# Patient Record
Sex: Female | Born: 1935 | Race: Black or African American | Hispanic: No | Marital: Married | Smoking: Never smoker
Health system: Southern US, Community
[De-identification: ages and names within clinical notes are randomized; demographics above are authoritative.]

## PROBLEM LIST (undated history)

## (undated) DIAGNOSIS — Z7901 Long term (current) use of anticoagulants: Secondary | ICD-10-CM

## (undated) DIAGNOSIS — I471 Supraventricular tachycardia, unspecified: Secondary | ICD-10-CM

## (undated) DIAGNOSIS — M1990 Unspecified osteoarthritis, unspecified site: Secondary | ICD-10-CM

## (undated) DIAGNOSIS — N184 Chronic kidney disease, stage 4 (severe): Secondary | ICD-10-CM

## (undated) DIAGNOSIS — D649 Anemia, unspecified: Secondary | ICD-10-CM

## (undated) DIAGNOSIS — Z9289 Personal history of other medical treatment: Secondary | ICD-10-CM

## (undated) DIAGNOSIS — K759 Inflammatory liver disease, unspecified: Secondary | ICD-10-CM

## (undated) DIAGNOSIS — Z95810 Presence of automatic (implantable) cardiac defibrillator: Secondary | ICD-10-CM

## (undated) DIAGNOSIS — G8929 Other chronic pain: Secondary | ICD-10-CM

## (undated) DIAGNOSIS — I509 Heart failure, unspecified: Secondary | ICD-10-CM

## (undated) DIAGNOSIS — I5022 Chronic systolic (congestive) heart failure: Secondary | ICD-10-CM

## (undated) DIAGNOSIS — I10 Essential (primary) hypertension: Secondary | ICD-10-CM

## (undated) DIAGNOSIS — I2510 Atherosclerotic heart disease of native coronary artery without angina pectoris: Secondary | ICD-10-CM

## (undated) DIAGNOSIS — I428 Other cardiomyopathies: Secondary | ICD-10-CM

## (undated) DIAGNOSIS — I4589 Other specified conduction disorders: Secondary | ICD-10-CM

## (undated) DIAGNOSIS — I8290 Acute embolism and thrombosis of unspecified vein: Secondary | ICD-10-CM

## (undated) DIAGNOSIS — M546 Pain in thoracic spine: Secondary | ICD-10-CM

## (undated) DIAGNOSIS — M549 Dorsalgia, unspecified: Secondary | ICD-10-CM

## (undated) DIAGNOSIS — I639 Cerebral infarction, unspecified: Secondary | ICD-10-CM

## (undated) HISTORY — DX: Anemia, unspecified: D64.9

## (undated) HISTORY — DX: Long term (current) use of anticoagulants: Z79.01

## (undated) HISTORY — PX: AV NODE ABLATION: SHX1209

## (undated) HISTORY — DX: Supraventricular tachycardia, unspecified: I47.10

## (undated) HISTORY — DX: Acute embolism and thrombosis of unspecified vein: I82.90

## (undated) HISTORY — PX: BI-VENTRICULAR IMPLANTABLE CARDIOVERTER DEFIBRILLATOR  (CRT-D): SHX5747

## (undated) HISTORY — DX: Other cardiomyopathies: I42.8

## (undated) HISTORY — PX: ABDOMINAL HYSTERECTOMY: SHX81

## (undated) HISTORY — DX: Supraventricular tachycardia: I47.1

## (undated) HISTORY — DX: Unspecified osteoarthritis, unspecified site: M19.90

## (undated) HISTORY — DX: Essential (primary) hypertension: I10

## (undated) HISTORY — PX: CATARACT EXTRACTION W/ INTRAOCULAR LENS IMPLANT: SHX1309

## (undated) HISTORY — DX: Cerebral infarction, unspecified: I63.9

## (undated) HISTORY — PX: CARDIAC CATHETERIZATION: SHX172

---

## 1960-09-01 DIAGNOSIS — K759 Inflammatory liver disease, unspecified: Secondary | ICD-10-CM

## 1960-09-01 HISTORY — DX: Inflammatory liver disease, unspecified: K75.9

## 1990-09-01 DIAGNOSIS — I639 Cerebral infarction, unspecified: Secondary | ICD-10-CM

## 1990-09-01 HISTORY — DX: Cerebral infarction, unspecified: I63.9

## 2001-04-13 ENCOUNTER — Encounter: Payer: Self-pay | Admitting: Family Medicine

## 2001-04-13 ENCOUNTER — Ambulatory Visit (HOSPITAL_COMMUNITY): Admission: RE | Admit: 2001-04-13 | Discharge: 2001-04-13 | Payer: Self-pay | Admitting: Family Medicine

## 2001-06-25 ENCOUNTER — Ambulatory Visit (HOSPITAL_COMMUNITY): Admission: RE | Admit: 2001-06-25 | Discharge: 2001-06-25 | Payer: Self-pay | Admitting: Family Medicine

## 2001-06-25 ENCOUNTER — Encounter: Payer: Self-pay | Admitting: Family Medicine

## 2001-08-17 ENCOUNTER — Ambulatory Visit (HOSPITAL_COMMUNITY): Admission: RE | Admit: 2001-08-17 | Discharge: 2001-08-17 | Payer: Self-pay | Admitting: Internal Medicine

## 2001-09-07 ENCOUNTER — Ambulatory Visit (HOSPITAL_COMMUNITY): Admission: RE | Admit: 2001-09-07 | Discharge: 2001-09-08 | Payer: Self-pay | Admitting: *Deleted

## 2003-09-05 ENCOUNTER — Ambulatory Visit (HOSPITAL_COMMUNITY): Admission: RE | Admit: 2003-09-05 | Discharge: 2003-09-05 | Payer: Self-pay | Admitting: Family Medicine

## 2004-04-16 ENCOUNTER — Ambulatory Visit (HOSPITAL_COMMUNITY): Admission: RE | Admit: 2004-04-16 | Discharge: 2004-04-16 | Payer: Self-pay | Admitting: Family Medicine

## 2004-06-24 ENCOUNTER — Ambulatory Visit (HOSPITAL_COMMUNITY): Admission: RE | Admit: 2004-06-24 | Discharge: 2004-06-24 | Payer: Self-pay | Admitting: Cardiology

## 2005-03-26 ENCOUNTER — Ambulatory Visit (HOSPITAL_COMMUNITY): Admission: RE | Admit: 2005-03-26 | Discharge: 2005-03-26 | Payer: Self-pay | Admitting: Family Medicine

## 2006-08-11 ENCOUNTER — Ambulatory Visit (HOSPITAL_COMMUNITY): Admission: RE | Admit: 2006-08-11 | Discharge: 2006-08-11 | Payer: Self-pay | Admitting: Ophthalmology

## 2006-09-03 ENCOUNTER — Ambulatory Visit (HOSPITAL_COMMUNITY): Admission: RE | Admit: 2006-09-03 | Discharge: 2006-09-03 | Payer: Self-pay | Admitting: Ophthalmology

## 2006-09-03 ENCOUNTER — Ambulatory Visit (HOSPITAL_COMMUNITY): Admission: RE | Admit: 2006-09-03 | Discharge: 2006-09-03 | Payer: Self-pay | Admitting: Family Medicine

## 2006-09-07 ENCOUNTER — Ambulatory Visit: Payer: Self-pay | Admitting: Cardiology

## 2006-09-07 ENCOUNTER — Inpatient Hospital Stay (HOSPITAL_COMMUNITY): Admission: AD | Admit: 2006-09-07 | Discharge: 2006-09-11 | Payer: Self-pay | Admitting: Family Medicine

## 2006-09-23 ENCOUNTER — Ambulatory Visit: Payer: Self-pay | Admitting: Cardiology

## 2006-10-22 ENCOUNTER — Ambulatory Visit: Payer: Self-pay | Admitting: Internal Medicine

## 2006-12-21 ENCOUNTER — Ambulatory Visit: Payer: Self-pay | Admitting: Cardiology

## 2007-01-20 ENCOUNTER — Ambulatory Visit: Payer: Self-pay | Admitting: Cardiology

## 2007-03-23 ENCOUNTER — Ambulatory Visit: Payer: Self-pay | Admitting: Cardiology

## 2007-09-27 ENCOUNTER — Ambulatory Visit (HOSPITAL_COMMUNITY): Admission: RE | Admit: 2007-09-27 | Discharge: 2007-09-27 | Payer: Self-pay | Admitting: Family Medicine

## 2007-09-27 ENCOUNTER — Encounter (INDEPENDENT_AMBULATORY_CARE_PROVIDER_SITE_OTHER): Payer: Self-pay | Admitting: Family Medicine

## 2007-09-27 ENCOUNTER — Inpatient Hospital Stay (HOSPITAL_COMMUNITY): Admission: AD | Admit: 2007-09-27 | Discharge: 2007-10-01 | Payer: Self-pay | Admitting: Family Medicine

## 2007-09-27 ENCOUNTER — Ambulatory Visit: Payer: Self-pay | Admitting: Cardiology

## 2007-10-07 ENCOUNTER — Ambulatory Visit: Payer: Self-pay | Admitting: Cardiology

## 2007-11-05 ENCOUNTER — Ambulatory Visit (HOSPITAL_COMMUNITY): Admission: RE | Admit: 2007-11-05 | Discharge: 2007-11-05 | Payer: Self-pay | Admitting: Cardiology

## 2007-11-05 ENCOUNTER — Ambulatory Visit: Payer: Self-pay | Admitting: Cardiology

## 2007-12-29 ENCOUNTER — Ambulatory Visit: Payer: Self-pay | Admitting: Internal Medicine

## 2007-12-29 ENCOUNTER — Ambulatory Visit (HOSPITAL_COMMUNITY): Admission: RE | Admit: 2007-12-29 | Discharge: 2007-12-29 | Payer: Self-pay | Admitting: Internal Medicine

## 2008-01-03 ENCOUNTER — Ambulatory Visit: Payer: Self-pay | Admitting: Internal Medicine

## 2008-01-03 ENCOUNTER — Inpatient Hospital Stay (HOSPITAL_COMMUNITY): Admission: RE | Admit: 2008-01-03 | Discharge: 2008-01-04 | Payer: Self-pay | Admitting: Internal Medicine

## 2008-01-10 ENCOUNTER — Ambulatory Visit: Payer: Self-pay

## 2008-01-19 ENCOUNTER — Ambulatory Visit: Payer: Self-pay | Admitting: Internal Medicine

## 2008-02-10 ENCOUNTER — Ambulatory Visit: Payer: Self-pay | Admitting: Cardiovascular Disease

## 2008-04-26 ENCOUNTER — Ambulatory Visit: Payer: Self-pay | Admitting: Internal Medicine

## 2008-07-20 ENCOUNTER — Ambulatory Visit (HOSPITAL_COMMUNITY): Admission: RE | Admit: 2008-07-20 | Discharge: 2008-07-20 | Payer: Self-pay | Admitting: Cardiology

## 2008-07-20 ENCOUNTER — Ambulatory Visit: Payer: Self-pay | Admitting: Cardiology

## 2008-07-21 ENCOUNTER — Ambulatory Visit: Payer: Self-pay | Admitting: Cardiology

## 2008-11-02 ENCOUNTER — Encounter: Payer: Self-pay | Admitting: Internal Medicine

## 2008-11-06 ENCOUNTER — Encounter: Payer: Self-pay | Admitting: Emergency Medicine

## 2008-11-06 ENCOUNTER — Ambulatory Visit: Payer: Self-pay | Admitting: Cardiology

## 2008-11-07 ENCOUNTER — Inpatient Hospital Stay (HOSPITAL_COMMUNITY): Admission: EM | Admit: 2008-11-07 | Discharge: 2008-11-15 | Payer: Self-pay | Admitting: Cardiology

## 2008-11-23 ENCOUNTER — Ambulatory Visit: Payer: Self-pay | Admitting: Cardiology

## 2008-12-13 ENCOUNTER — Encounter: Payer: Self-pay | Admitting: Internal Medicine

## 2008-12-13 ENCOUNTER — Ambulatory Visit: Payer: Self-pay | Admitting: Internal Medicine

## 2009-02-01 ENCOUNTER — Inpatient Hospital Stay (HOSPITAL_COMMUNITY): Admission: AD | Admit: 2009-02-01 | Discharge: 2009-02-07 | Payer: Self-pay | Admitting: Cardiology

## 2009-02-01 ENCOUNTER — Encounter: Payer: Self-pay | Admitting: Cardiology

## 2009-02-01 ENCOUNTER — Ambulatory Visit: Payer: Self-pay | Admitting: Cardiology

## 2009-02-01 DIAGNOSIS — I6350 Cerebral infarction due to unspecified occlusion or stenosis of unspecified cerebral artery: Secondary | ICD-10-CM | POA: Insufficient documentation

## 2009-02-12 ENCOUNTER — Encounter: Payer: Self-pay | Admitting: Physician Assistant

## 2009-02-12 ENCOUNTER — Ambulatory Visit: Payer: Self-pay | Admitting: Cardiology

## 2009-02-12 DIAGNOSIS — M109 Gout, unspecified: Secondary | ICD-10-CM | POA: Insufficient documentation

## 2009-02-14 ENCOUNTER — Ambulatory Visit: Payer: Self-pay | Admitting: Cardiology

## 2009-02-14 ENCOUNTER — Inpatient Hospital Stay (HOSPITAL_COMMUNITY): Admission: AD | Admit: 2009-02-14 | Discharge: 2009-02-22 | Payer: Self-pay | Admitting: Cardiology

## 2009-02-15 ENCOUNTER — Encounter: Payer: Self-pay | Admitting: Cardiology

## 2009-02-27 ENCOUNTER — Encounter: Payer: Self-pay | Admitting: Cardiology

## 2009-02-27 ENCOUNTER — Ambulatory Visit: Payer: Self-pay | Admitting: Cardiology

## 2009-02-27 DIAGNOSIS — N183 Chronic kidney disease, stage 3 unspecified: Secondary | ICD-10-CM | POA: Insufficient documentation

## 2009-03-08 ENCOUNTER — Telehealth: Payer: Self-pay | Admitting: Cardiology

## 2009-03-12 ENCOUNTER — Encounter: Payer: Self-pay | Admitting: Cardiology

## 2009-03-12 LAB — CONVERTED CEMR LAB
AST: 17 units/L (ref 0–37)
Alkaline Phosphatase: 95 units/L (ref 39–117)
Basophils Absolute: 0 10*3/uL (ref 0.0–0.1)
Basophils Relative: 1 % (ref 0–1)
CO2: 26 meq/L (ref 19–32)
Calcium: 8.9 mg/dL (ref 8.4–10.5)
HCT: 32 % — ABNORMAL LOW (ref 36.0–46.0)
Lymphocytes Relative: 29 % (ref 12–46)
Lymphs Abs: 1.9 10*3/uL (ref 0.7–4.0)
MCHC: 30.9 g/dL (ref 30.0–36.0)
MCV: 92 fL (ref 78.0–100.0)
Monocytes Relative: 10 % (ref 3–12)
Neutro Abs: 3.8 10*3/uL (ref 1.7–7.7)
Platelets: 332 10*3/uL (ref 150–400)
Potassium: 3.5 meq/L (ref 3.5–5.3)
RBC: 3.48 M/uL — ABNORMAL LOW (ref 3.87–5.11)
Total Bilirubin: 0.4 mg/dL (ref 0.3–1.2)
WBC: 6.5 10*3/uL (ref 4.0–10.5)

## 2009-03-19 ENCOUNTER — Encounter: Payer: Self-pay | Admitting: Cardiology

## 2009-03-29 ENCOUNTER — Ambulatory Visit (HOSPITAL_COMMUNITY): Admission: RE | Admit: 2009-03-29 | Discharge: 2009-03-29 | Payer: Self-pay | Admitting: Cardiology

## 2009-03-29 ENCOUNTER — Encounter: Payer: Self-pay | Admitting: Cardiology

## 2009-03-29 ENCOUNTER — Ambulatory Visit: Payer: Self-pay | Admitting: Cardiology

## 2009-03-30 ENCOUNTER — Ambulatory Visit: Payer: Self-pay | Admitting: Cardiology

## 2009-04-11 ENCOUNTER — Encounter: Payer: Self-pay | Admitting: Internal Medicine

## 2009-04-11 ENCOUNTER — Ambulatory Visit: Payer: Self-pay | Admitting: Internal Medicine

## 2009-04-11 ENCOUNTER — Encounter: Payer: Self-pay | Admitting: Cardiology

## 2009-04-13 ENCOUNTER — Encounter: Payer: Self-pay | Admitting: Cardiology

## 2009-04-13 LAB — CONVERTED CEMR LAB
Calcium: 9.2 mg/dL (ref 8.4–10.5)
Chloride: 102 meq/L (ref 96–112)
Potassium: 3.5 meq/L (ref 3.5–5.3)

## 2009-04-19 ENCOUNTER — Ambulatory Visit (HOSPITAL_COMMUNITY): Admission: RE | Admit: 2009-04-19 | Discharge: 2009-04-19 | Payer: Self-pay | Admitting: Family Medicine

## 2009-04-25 ENCOUNTER — Ambulatory Visit: Payer: Self-pay | Admitting: Cardiology

## 2009-05-28 ENCOUNTER — Encounter: Payer: Self-pay | Admitting: Cardiology

## 2009-05-29 ENCOUNTER — Encounter (INDEPENDENT_AMBULATORY_CARE_PROVIDER_SITE_OTHER): Payer: Self-pay | Admitting: *Deleted

## 2009-05-29 LAB — CONVERTED CEMR LAB
ALT: 11 units/L (ref 0–35)
AST: 15 units/L (ref 0–37)
Albumin: 3.4 g/dL — ABNORMAL LOW (ref 3.5–5.2)
Alkaline Phosphatase: 74 units/L (ref 39–117)
Basophils Absolute: 0.1 10*3/uL (ref 0.0–0.1)
CO2: 27 meq/L (ref 19–32)
Calcium: 8.5 mg/dL (ref 8.4–10.5)
Eosinophils Absolute: 0.1 10*3/uL (ref 0.0–0.7)
Eosinophils Relative: 1 % (ref 0–5)
HCT: 31.3 % — ABNORMAL LOW (ref 36.0–46.0)
Lymphs Abs: 1.7 10*3/uL (ref 0.7–4.0)
Monocytes Absolute: 0.9 10*3/uL (ref 0.1–1.0)
Monocytes Relative: 10 % (ref 3–12)
Neutro Abs: 5.9 10*3/uL (ref 1.7–7.7)
Potassium: 4 meq/L (ref 3.5–5.3)
RBC: 3.45 M/uL — ABNORMAL LOW (ref 3.87–5.11)
RDW: 15.2 % (ref 11.5–15.5)
Total Bilirubin: 0.4 mg/dL (ref 0.3–1.2)
Total Protein: 6.2 g/dL (ref 6.0–8.3)

## 2009-06-04 LAB — CONVERTED CEMR LAB
CO2: 25 meq/L (ref 19–32)
Calcium: 8.5 mg/dL (ref 8.4–10.5)
Chloride: 103 meq/L (ref 96–112)
Glucose, Bld: 116 mg/dL — ABNORMAL HIGH (ref 70–99)
Sodium: 144 meq/L (ref 135–145)

## 2009-06-13 ENCOUNTER — Ambulatory Visit (HOSPITAL_COMMUNITY): Admission: RE | Admit: 2009-06-13 | Discharge: 2009-06-13 | Payer: Self-pay | Admitting: Family Medicine

## 2009-06-26 ENCOUNTER — Ambulatory Visit: Payer: Self-pay | Admitting: Cardiology

## 2009-06-26 ENCOUNTER — Encounter (INDEPENDENT_AMBULATORY_CARE_PROVIDER_SITE_OTHER): Payer: Self-pay | Admitting: *Deleted

## 2009-07-04 ENCOUNTER — Encounter (INDEPENDENT_AMBULATORY_CARE_PROVIDER_SITE_OTHER): Payer: Self-pay | Admitting: *Deleted

## 2009-07-04 LAB — CONVERTED CEMR LAB
OCCULT 1: NEGATIVE
OCCULT 2: NEGATIVE

## 2009-07-06 ENCOUNTER — Encounter (INDEPENDENT_AMBULATORY_CARE_PROVIDER_SITE_OTHER): Payer: Self-pay | Admitting: *Deleted

## 2009-07-06 LAB — CONVERTED CEMR LAB
OCCULT 1: NEGATIVE
OCCULT 2: NEGATIVE

## 2009-07-09 ENCOUNTER — Encounter (INDEPENDENT_AMBULATORY_CARE_PROVIDER_SITE_OTHER): Payer: Self-pay | Admitting: *Deleted

## 2009-07-18 ENCOUNTER — Ambulatory Visit: Payer: Self-pay | Admitting: Internal Medicine

## 2009-08-06 ENCOUNTER — Emergency Department (HOSPITAL_COMMUNITY): Admission: EM | Admit: 2009-08-06 | Discharge: 2009-08-06 | Payer: Self-pay | Admitting: Emergency Medicine

## 2009-08-06 ENCOUNTER — Encounter: Payer: Self-pay | Admitting: Orthopedic Surgery

## 2009-08-08 ENCOUNTER — Encounter (INDEPENDENT_AMBULATORY_CARE_PROVIDER_SITE_OTHER): Payer: Self-pay | Admitting: *Deleted

## 2009-08-08 ENCOUNTER — Ambulatory Visit (HOSPITAL_COMMUNITY): Admission: RE | Admit: 2009-08-08 | Discharge: 2009-08-08 | Payer: Self-pay | Admitting: Adult Health

## 2009-08-08 ENCOUNTER — Ambulatory Visit: Payer: Self-pay | Admitting: Adult Health

## 2009-08-08 DIAGNOSIS — R269 Unspecified abnormalities of gait and mobility: Secondary | ICD-10-CM

## 2009-08-08 LAB — CONVERTED CEMR LAB
BUN: 33 mg/dL
Chloride: 103 meq/L
Chloride: 103 meq/L
Creatinine, Ser: 2.05 mg/dL
Glucose, Bld: 129 mg/dL
HCT: 27.2 %
INR: 3.21
MCV: 87 fL
Potassium: 3.5 meq/L
Potassium: 3.5 meq/L
Sodium: 138 meq/L

## 2009-08-09 ENCOUNTER — Ambulatory Visit: Payer: Self-pay | Admitting: Orthopedic Surgery

## 2009-08-09 DIAGNOSIS — S63509A Unspecified sprain of unspecified wrist, initial encounter: Secondary | ICD-10-CM | POA: Insufficient documentation

## 2009-08-10 ENCOUNTER — Encounter (INDEPENDENT_AMBULATORY_CARE_PROVIDER_SITE_OTHER): Payer: Self-pay | Admitting: *Deleted

## 2009-08-10 ENCOUNTER — Ambulatory Visit: Payer: Self-pay | Admitting: Cardiology

## 2009-08-30 ENCOUNTER — Encounter: Payer: Self-pay | Admitting: Adult Health

## 2009-08-30 ENCOUNTER — Encounter (INDEPENDENT_AMBULATORY_CARE_PROVIDER_SITE_OTHER): Payer: Self-pay | Admitting: *Deleted

## 2009-08-30 LAB — CONVERTED CEMR LAB
AST: 14 units/L (ref 0–37)
Albumin: 3.8 g/dL
BUN: 32 mg/dL — ABNORMAL HIGH (ref 6–23)
Basophils Absolute: 0 10*3/uL (ref 0.0–0.1)
Basophils Relative: 1 % (ref 0–1)
CO2: 21 meq/L
Calcium: 8.9 mg/dL (ref 8.4–10.5)
Chloride: 104 meq/L
Chloride: 104 meq/L (ref 96–112)
Creatinine, Ser: 1.65 mg/dL
Creatinine, Ser: 1.65 mg/dL — ABNORMAL HIGH (ref 0.40–1.20)
Eosinophils Absolute: 0.1 10*3/uL (ref 0.0–0.7)
Eosinophils Relative: 2 % (ref 0–5)
Glucose, Bld: 106 mg/dL
HCT: 33.4 % — ABNORMAL LOW (ref 36.0–46.0)
Lymphs Abs: 3.1 10*3/uL (ref 0.7–4.0)
MCHC: 33.2 g/dL (ref 30.0–36.0)
MCV: 85.9 fL (ref 78.0–100.0)
Neutrophils Relative %: 49 % (ref 43–77)
Platelets: 287 10*3/uL (ref 150–400)
RDW: 15.9 % — ABNORMAL HIGH (ref 11.5–15.5)
Total Bilirubin: 0.3 mg/dL (ref 0.3–1.2)
Total Protein: 6.8 g/dL

## 2009-09-12 ENCOUNTER — Encounter (INDEPENDENT_AMBULATORY_CARE_PROVIDER_SITE_OTHER): Payer: Self-pay | Admitting: *Deleted

## 2009-09-14 ENCOUNTER — Encounter: Payer: Self-pay | Admitting: Orthopedic Surgery

## 2009-09-14 ENCOUNTER — Ambulatory Visit: Payer: Self-pay | Admitting: Cardiology

## 2009-09-14 ENCOUNTER — Encounter (INDEPENDENT_AMBULATORY_CARE_PROVIDER_SITE_OTHER): Payer: Self-pay | Admitting: *Deleted

## 2009-09-20 ENCOUNTER — Ambulatory Visit: Payer: Self-pay | Admitting: Orthopedic Surgery

## 2009-10-03 ENCOUNTER — Encounter: Payer: Self-pay | Admitting: Cardiology

## 2009-10-08 ENCOUNTER — Encounter (INDEPENDENT_AMBULATORY_CARE_PROVIDER_SITE_OTHER): Payer: Self-pay | Admitting: *Deleted

## 2009-10-08 LAB — CONVERTED CEMR LAB
CO2: 27 meq/L
Glucose, Bld: 115 mg/dL
Potassium: 3.5 meq/L
Sodium: 144 meq/L

## 2009-10-09 ENCOUNTER — Encounter (INDEPENDENT_AMBULATORY_CARE_PROVIDER_SITE_OTHER): Payer: Self-pay | Admitting: *Deleted

## 2009-10-09 ENCOUNTER — Encounter: Payer: Self-pay | Admitting: Cardiology

## 2009-10-09 LAB — CONVERTED CEMR LAB
Chloride: 105 meq/L (ref 96–112)
Cholesterol: 230 mg/dL — ABNORMAL HIGH (ref 0–200)
Glucose, Bld: 115 mg/dL — ABNORMAL HIGH (ref 70–99)
Potassium: 3.5 meq/L (ref 3.5–5.3)
Sodium: 144 meq/L (ref 135–145)
Total CHOL/HDL Ratio: 6.2
Triglycerides: 190 mg/dL — ABNORMAL HIGH (ref <150)
VLDL: 38 mg/dL (ref 0–40)

## 2009-10-19 ENCOUNTER — Encounter: Payer: Self-pay | Admitting: Internal Medicine

## 2009-10-19 ENCOUNTER — Ambulatory Visit: Payer: Self-pay | Admitting: Cardiovascular Disease

## 2009-11-01 ENCOUNTER — Ambulatory Visit: Payer: Self-pay | Admitting: Orthopedic Surgery

## 2009-11-01 ENCOUNTER — Encounter (INDEPENDENT_AMBULATORY_CARE_PROVIDER_SITE_OTHER): Payer: Self-pay | Admitting: *Deleted

## 2009-11-05 ENCOUNTER — Encounter (INDEPENDENT_AMBULATORY_CARE_PROVIDER_SITE_OTHER): Payer: Self-pay | Admitting: *Deleted

## 2009-11-05 ENCOUNTER — Ambulatory Visit: Payer: Self-pay | Admitting: Cardiology

## 2009-11-05 LAB — CONVERTED CEMR LAB
BUN: 25 mg/dL — ABNORMAL HIGH (ref 6–23)
CO2: 23 meq/L
CO2: 23 meq/L (ref 19–32)
Calcium: 9.1 mg/dL
Calcium: 9.1 mg/dL (ref 8.4–10.5)
Creatinine, Ser: 1.58 mg/dL
Creatinine, Ser: 1.58 mg/dL — ABNORMAL HIGH (ref 0.40–1.20)
Glucose, Bld: 148 mg/dL — ABNORMAL HIGH (ref 70–99)

## 2009-11-06 ENCOUNTER — Encounter (INDEPENDENT_AMBULATORY_CARE_PROVIDER_SITE_OTHER): Payer: Self-pay | Admitting: *Deleted

## 2010-01-07 ENCOUNTER — Encounter: Payer: Self-pay | Admitting: Internal Medicine

## 2010-01-07 ENCOUNTER — Ambulatory Visit: Payer: Self-pay | Admitting: Cardiology

## 2010-01-29 ENCOUNTER — Encounter (INDEPENDENT_AMBULATORY_CARE_PROVIDER_SITE_OTHER): Payer: Self-pay | Admitting: *Deleted

## 2010-02-05 ENCOUNTER — Ambulatory Visit: Payer: Self-pay | Admitting: Cardiology

## 2010-02-05 ENCOUNTER — Encounter (INDEPENDENT_AMBULATORY_CARE_PROVIDER_SITE_OTHER): Payer: Self-pay | Admitting: *Deleted

## 2010-02-05 LAB — CONVERTED CEMR LAB
ALT: 9 units/L
Albumin: 3.9 g/dL
BUN: 28 mg/dL
BUN: 28 mg/dL
BUN: 28 mg/dL — ABNORMAL HIGH (ref 6–23)
CO2: 28 meq/L
CO2: 23 meq/L (ref 19–32)
Calcium: 9.3 mg/dL
Calcium: 9.3 mg/dL (ref 8.4–10.5)
Chloride: 107 meq/L
Chloride: 107 meq/L (ref 96–112)
Creatinine, Ser: 1.35 mg/dL
Creatinine, Ser: 1.35 mg/dL — ABNORMAL HIGH (ref 0.40–1.20)
Eosinophils Absolute: 0.2 10*3/uL (ref 0.0–0.7)
Eosinophils Relative: 4 % (ref 0–5)
Glucose, Bld: 94 mg/dL
Glucose, Bld: 94 mg/dL (ref 70–99)
HCT: 33.2 % — ABNORMAL LOW (ref 36.0–46.0)
Hemoglobin: 10.6 g/dL
Lymphocytes Relative: 35 %
Lymphs Abs: 2.1 10*3/uL (ref 0.7–4.0)
MCV: 90 fL
MCV: 90.2 fL (ref 78.0–100.0)
Magnesium: 2.2 mg/dL
Magnesium: 2.2 mg/dL (ref 1.5–2.5)
Monocytes Absolute: 0.5 10*3/uL
Monocytes Absolute: 0.5 10*3/uL (ref 0.1–1.0)
Monocytes Relative: 9 %
Monocytes Relative: 9 % (ref 3–12)
Platelets: 288 10*3/uL
Platelets: 288 10*3/uL (ref 150–400)
Potassium: 3.8 meq/L
RDW: 15.3 %
Sodium: 144 meq/L
TSH: 1.417 microintl units/mL (ref 0.350–4.500)
Uric Acid, Serum: 10 mg/dL — ABNORMAL HIGH (ref 2.4–7.0)
WBC: 5.9 10*3/uL
WBC: 5.9 10*3/uL
WBC: 5.9 10*3/uL (ref 4.0–10.5)

## 2010-02-08 ENCOUNTER — Encounter: Payer: Self-pay | Admitting: Cardiology

## 2010-03-21 IMAGING — CR DG CHEST 2V
2 series · 2 of 2 positions shown · non-contrast
Comparison: 11/05/2007

CLINICAL DATA: Cardiomyopathy, dyspnea, hypertension

CHEST - 2 VIEW

[view not recorded (1 of 2)]
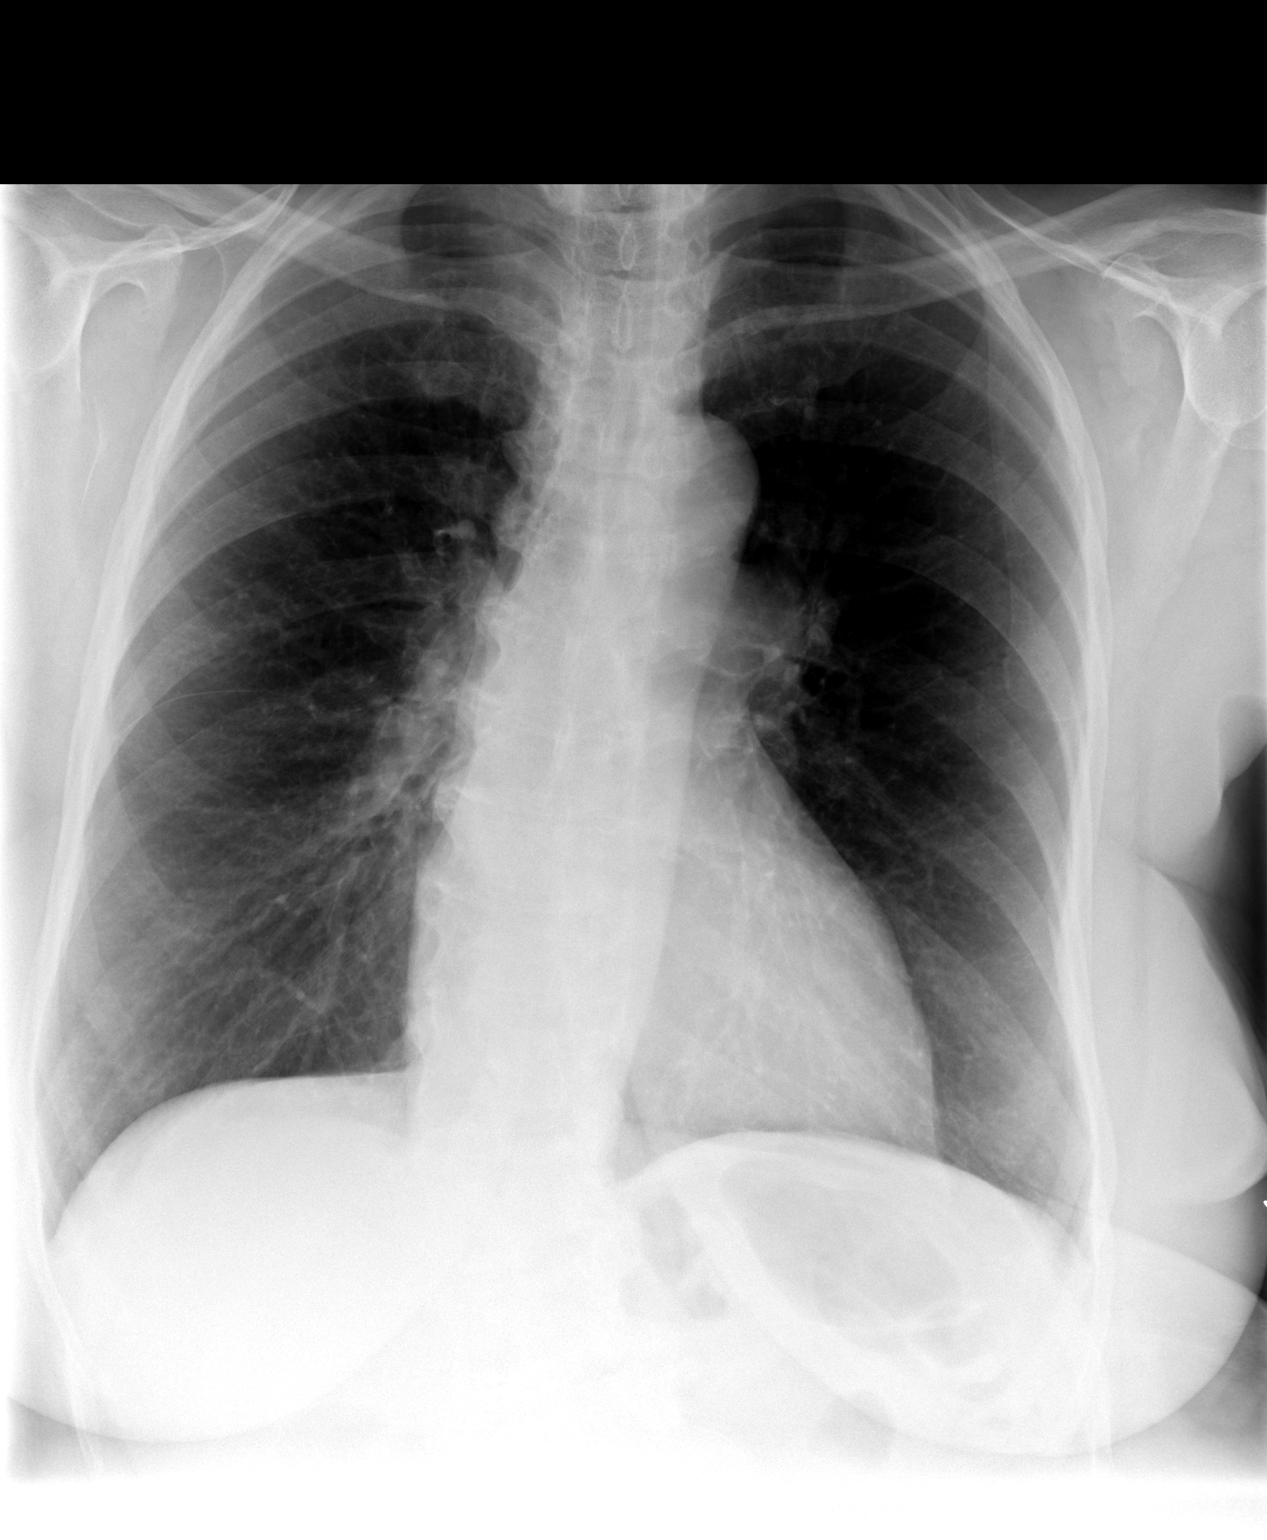

[view not recorded (2 of 2)]
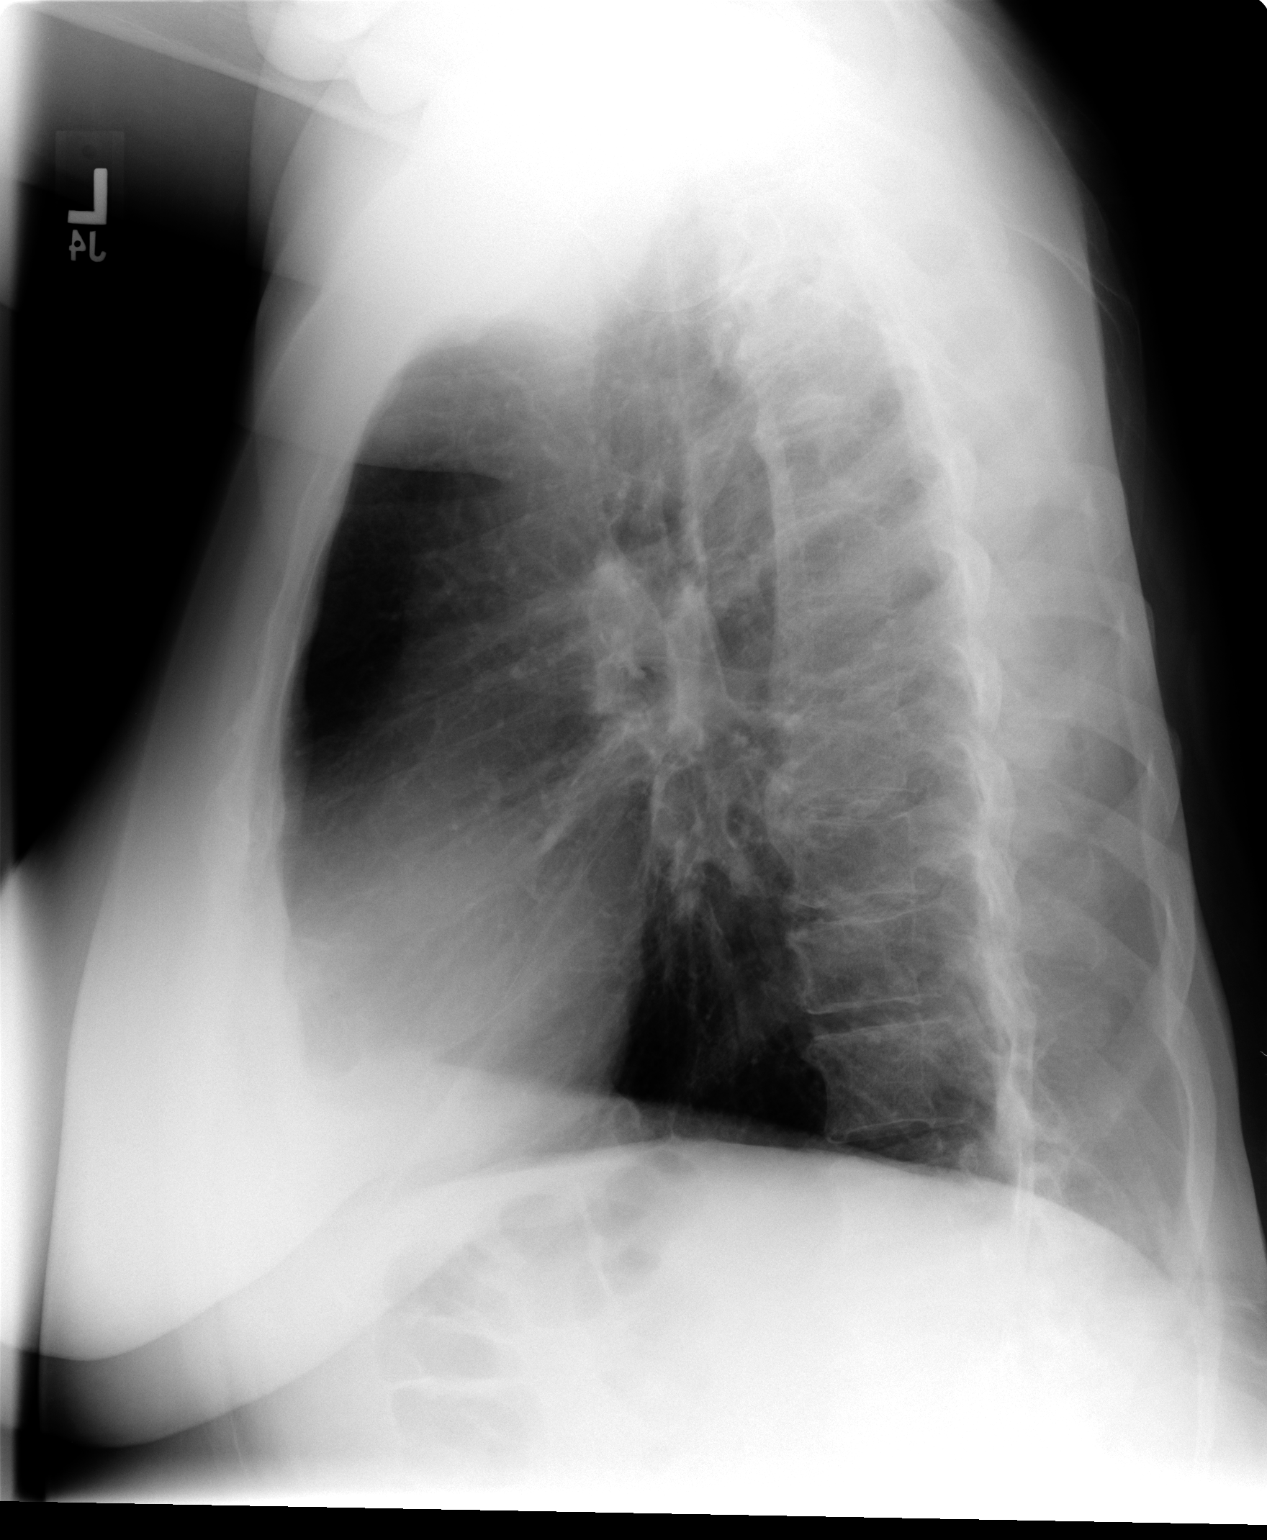

[2 of 2 positions shown; findings below may reference images not displayed]

FINDINGS: Read.
Normal mediastinal contours and pulmonary vascularity.
Mild chronic peribronchial thickening.
No pulmonary infiltrate or pleural effusion.
Biconvex thoracolumbar scoliosis with multilevel degenerative disc
disease changes and endplate spur formation.
Lungs appear mildly hyperexpanded.
IMPRESSION: Changes of COPD/chronic bronchitis.
Mild cardiomegaly.
No acute abnormalities.

## 2010-04-11 ENCOUNTER — Encounter (INDEPENDENT_AMBULATORY_CARE_PROVIDER_SITE_OTHER): Payer: Self-pay | Admitting: *Deleted

## 2010-05-02 ENCOUNTER — Ambulatory Visit: Payer: Self-pay | Admitting: Cardiology

## 2010-05-02 ENCOUNTER — Encounter: Payer: Self-pay | Admitting: Internal Medicine

## 2010-05-28 ENCOUNTER — Encounter (INDEPENDENT_AMBULATORY_CARE_PROVIDER_SITE_OTHER): Payer: Self-pay | Admitting: *Deleted

## 2010-05-31 ENCOUNTER — Encounter (INDEPENDENT_AMBULATORY_CARE_PROVIDER_SITE_OTHER): Payer: Self-pay | Admitting: *Deleted

## 2010-05-31 ENCOUNTER — Ambulatory Visit: Payer: Self-pay | Admitting: Cardiology

## 2010-05-31 LAB — CONVERTED CEMR LAB
BUN: 24 mg/dL
Basophils Absolute: 0.1 10*3/uL
Basophils Relative: 1 %
Brain Natriuretic Peptide: 138.7
HCT: 35 %
MCV: 90.2 fL
Platelets: 286 10*3/uL
Potassium: 4.2 meq/L
RDW: 13.7 %
Sodium: 144 meq/L

## 2010-06-03 ENCOUNTER — Encounter (INDEPENDENT_AMBULATORY_CARE_PROVIDER_SITE_OTHER): Payer: Self-pay | Admitting: *Deleted

## 2010-06-03 LAB — CONVERTED CEMR LAB
Basophils Absolute: 0.1 10*3/uL (ref 0.0–0.1)
Basophils Relative: 1 % (ref 0–1)
Calcium: 9.6 mg/dL (ref 8.4–10.5)
Eosinophils Absolute: 0.1 10*3/uL (ref 0.0–0.7)
Eosinophils Relative: 2 % (ref 0–5)
HCT: 35 % — ABNORMAL LOW (ref 36.0–46.0)
Lymphs Abs: 1.9 10*3/uL (ref 0.7–4.0)
MCHC: 32 g/dL (ref 30.0–36.0)
MCV: 90.2 fL (ref 78.0–100.0)
Neutrophils Relative %: 55 % (ref 43–77)
Platelets: 286 10*3/uL (ref 150–400)
Potassium: 4.2 meq/L (ref 3.5–5.3)
Pro B Natriuretic peptide (BNP): 138.7 pg/mL — ABNORMAL HIGH (ref 0.0–100.0)
RDW: 13.7 % (ref 11.5–15.5)
Sodium: 144 meq/L (ref 135–145)
WBC: 5.8 10*3/uL (ref 4.0–10.5)

## 2010-08-07 ENCOUNTER — Encounter: Payer: Self-pay | Admitting: Cardiology

## 2010-08-07 LAB — CONVERTED CEMR LAB
BUN: 29 mg/dL — ABNORMAL HIGH (ref 6–23)
CO2: 27 meq/L (ref 19–32)
Calcium: 8.9 mg/dL (ref 8.4–10.5)
Chloride: 104 meq/L (ref 96–112)
Creatinine, Ser: 1.43 mg/dL — ABNORMAL HIGH (ref 0.40–1.20)

## 2010-08-20 ENCOUNTER — Ambulatory Visit: Payer: Self-pay | Admitting: Internal Medicine

## 2010-08-20 DIAGNOSIS — I482 Chronic atrial fibrillation, unspecified: Secondary | ICD-10-CM

## 2010-08-20 DIAGNOSIS — I5022 Chronic systolic (congestive) heart failure: Secondary | ICD-10-CM

## 2010-08-29 ENCOUNTER — Ambulatory Visit: Payer: Self-pay | Admitting: Cardiology

## 2010-09-11 ENCOUNTER — Ambulatory Visit: Admission: RE | Admit: 2010-09-11 | Discharge: 2010-09-11 | Payer: Self-pay | Source: Home / Self Care

## 2010-09-11 ENCOUNTER — Encounter: Payer: Self-pay | Admitting: Cardiology

## 2010-09-11 LAB — CONVERTED CEMR LAB: POC INR: 2.8

## 2010-09-22 ENCOUNTER — Encounter: Payer: Self-pay | Admitting: Family Medicine

## 2010-09-29 LAB — CONVERTED CEMR LAB
CO2: 26 meq/L
Chloride: 105 meq/L
Glucose, Bld: 100 mg/dL
Hemoglobin: 11.2 g/dL
Sodium: 144 meq/L

## 2010-10-03 NOTE — Procedures (Signed)
Summary: PC2   Current Medications (verified): 1)  Coumadin 4 Mg Tabs (Warfarin Sodium) .... As Directed 2)  Famotidine 20 Mg Tabs (Famotidine) .... Take 1 Tablet By Mouth Two Times A Day 3)  Multi-Vitamin/iron  Tabs (Multiple Vitamins-Iron) .Marland Kitchen.. 1 Tab Once Daily 4)  Hydralazine Hcl 100 Mg Tabs (Hydralazine Hcl) .... Take 1 Tablet Three Times A Day 5)  Carvedilol 25 Mg Tabs (Carvedilol) .... Take 1 Tablet Two Times A Day 6)  Imdur 120 Mg Xr24h-Tab (Isosorbide Mononitrate) .Marland Kitchen.. 1 Tab Once Daily 7)  Digoxin 0.125 Mg Tabs (Digoxin) .... Take 1 Tablet Once Daily 8)  Furosemide 40 Mg Tabs (Furosemide) .... Take 1 1/2 Tabs Daily 9)  Klor-Con M20 20 Meq Cr-Tabs (Potassium Chloride Crys Cr) .... Take 2 Tablets in Am and 1 Tablet in Pm 10)  Prednisone 10 Mg Tabs (Prednisone) .Marland Kitchen.. 1 By Mouth Q Day  Allergies (verified): 1)  ! * Tb    ICD Specifications Following MD:  Lewayne Bunting, MD     ICD Vendor:  Medtronic     ICD Model Number:  [id number]     ICD Serial Number:  [id number] ICD DOI:  01/03/2008      Lead 1:    Location: RA     DOI: 01/03/2008     Model #: [id number]     Serial #: [id number]     Status: active Lead 2:    Location: RV     DOI: 01/03/2008     Model #: 6213     Serial #: [id number]     Status: active Lead 3:    Location: LV     DOI: 01/03/2008     Model #: [id number]     Serial #: [id number]     Status: active  Indications::  NICM   ICD Follow Up Remote Check?  No Battery Voltage:  3.11 V     Charge Time:  9.3 seconds     Underlying rhythm:  A-fib ICD Dependent:  Yes       ICD Device Measurements Atrium:  Amplitude: 1.9 mV, Impedance: 488 ohms,  Right Ventricle:  Amplitude: 7.1 mV, Impedance: 440 ohms, Threshold: 0.5 V at 0.4 msec Left Ventricle:  Impedance: 624 ohms, Threshold: 1.0 V at 0.4 msec Shock Impedance: 36/47 ohms   Episodes MS Episodes:  8659     Percent Mode Switch:  18.9%     Coumadin:  Yes Shock:  1     Nonsustained:  16     ICD Appropriate Therapy?  Yes Atrial  Pacing:  18%     Ventricular Pacing:  96.4%  Brady Parameters Mode DDD     Lower Rate Limit:  50     Upper Rate Limit 130 PAV 160     Sensed AV Delay:  140  Tachy Zones VF:  214     VT1:  182     Next Cardiology Appt Due:  12/30/2009 Tech Comments:  One VF episode with appropriate therapy, during syncopal episode.  16NSVT episodes the longest 5 seconds.  Optivol and thoracic impedance abnormal 1/14-1/25.  Ms. Noguchi has been treated over the last 2 weeks for an URI and is beginnig to feel better.  FFRW noted, atrial sensitivity reprogrammed 0.16mV.  I instructed the patient to call us for any further syncopal episodes otherwise I will see her back in the [facility] office in 3 months. Altha Harm, LPN  October 19, 2009 11:35 AM

## 2010-10-03 NOTE — Letter (Signed)
Summary: [place] Results Engineer, agricultural at [facility]  [address], [place] [address]   Phone: [phone]  Fax: [phone]      November 06, 2009 MRN: [id number]   [name] [address] [place], [place]  [address]   Dear Ms. [facility],  Your test ordered by Selena Batten has been reviewed by your physician (or physician assistant) and was found to be normal or stable. Your physician (or physician assistant) felt no changes were needed at this time.  ____ Echocardiogram  ____ Cardiac Stress Test  __x__ Lab Work  ____ Peripheral vascular study of arms, legs or neck  ____ CT scan or X-ray  ____ Lung or Breathing test  ____ Other:  No change in medical treatment at this time, per Dr. Dietrich Pates.  Enclosed is a copy of your labwork for your records.  Thank you, [name] [name] RN    [name] Bing, MD, [name], MD, F.A.C.C Lewayne Bunting, MD, F.A.C.C Nona Dell, MD, F.A.C.C Charlton Haws, MD, [name]

## 2010-10-03 NOTE — Cardiovascular Report (Signed)
Summary: Office Visit   Office Visit   Imported By: Roderic Ovens 05/20/2010 15:40:22  _____________________________________________________________________  External Attachment:    Type:   Image     Comment:   External Document

## 2010-10-03 NOTE — Letter (Signed)
Summary: [place] [facility] Work Engineer, agricultural at [employer]  [address], [place] [address]   Phone: [phone]  Fax: [phone]     October 09, 2009 MRN: [id number]   [name] [address] [place], [place]  [address]      YOUR LAB WORK IS DUE   _________________MARCH 8, 2011________________________  Please go to [facility], located across the street from [facility] on the second floor.  Hours are Monday - Friday 7am until 7:30pm         Saturday 8am until 12noon    __  DO NOT EAT OR DRINK AFTER MIDNIGHT EVENING PRIOR TO LABWORK  _X_ YOUR LABWORK IS NOT FASTING --YOU MAY EAT PRIOR TO LABWORK

## 2010-10-03 NOTE — Cardiovascular Report (Signed)
Summary: Office Visit   Office Visit   Imported By: Roderic Ovens 01/15/2010 11:32:49  _____________________________________________________________________  External Attachment:    Type:   Image     Comment:   External Document

## 2010-10-03 NOTE — Letter (Signed)
Summary: [facility]  [facility]   Imported By: Cammie Sickle 09/25/2009 16:12:51  _____________________________________________________________________  External Attachment:    Type:   Image     Comment:   External Document

## 2010-10-03 NOTE — Assessment & Plan Note (Signed)
Summary: 6 WK RE-CK/RESP TO MED/SEC HORIZ/CAF   Visit Type:  Follow-up Referring Provider:  EP-Dr. Ladona Ridgel; Orthopedics-Dr. Romeo Apple Primary Provider:  Dr. [name]  CC:  recheck wrist.  History of Present Illness: 77 year old female treated for her LEFT upper extremity pain and swelling with prednisone with good result, also use the splint we started with prednisone taper.  She's doing very well to move her wrist much better has no pain tenderness or swelling    review of systems negative for other joint ailments  Exam full pronation supination 15 improvement in wrist extension no tenderness over the wrist grade 5 grip strength wrist remains stable no sensory deficits in the hand skin is intact without rash there is no lymphadenopathy pulses are good patient was awake and alert  Impression result tenosynovitis vs. tendinitis vs. gout LEFT upper extremity  Finish prednisone call us if any problems  Allergies: 1)  ! * Tb   Impression & Recommendations:  Problem # 1:  GOUT, UNSPECIFIED (ICD-274.9) Assessment Improved  Problem # 2:  WRIST SPRAIN, LEFT (ICD-842.00) Assessment: Improved  Patient Instructions: 1)  finish prednisone  2)  then call us if pain or swelling comes back

## 2010-10-03 NOTE — Letter (Signed)
Summary: [place] Results Engineer, agricultural at [facility]  [address], [place] [address]   Phone: [phone]  Fax: [phone]      August 07, 2010 MRN: [id number]   [name] [address] [place], [place]  [address]   Dear Ms. [facility],  Your test ordered by Selena Batten has been reviewed by your physician (or physician assistant) and was found to be normal or stable. Your physician (or physician assistant) felt no changes were needed at this time.  ____ Echocardiogram  ____ Cardiac Stress Test  __X__ Lab Work  ____ Peripheral vascular study of arms, legs or neck  ____ CT scan or X-ray  ____ Lung or Breathing test  ____ Other:  Please continue on current medical treatment.  Thank you.   Larita Fife Via LPN    [place] Bing, MD, F.A.C.Gaylord Shih, MD, F.A.C.C Lewayne Bunting, MD, F.A.C.C Nona Dell, MD, F.A.C.C Charlton Haws, MD, [name]

## 2010-10-03 NOTE — Procedures (Signed)
Summary: AICD f/u   Current Medications (verified): 1)  Multi-Vitamin/iron  Tabs (Multiple Vitamins-Iron) .Marland Kitchen.. 1 Tab Once Daily 2)  Hydralazine Hcl 100 Mg Tabs (Hydralazine Hcl) .... Take 1 Tablet Three Times A Day 3)  Carvedilol 25 Mg Tabs (Carvedilol) .... Take 1 Tablet Two Times A Day 4)  Imdur 120 Mg Xr24h-Tab (Isosorbide Mononitrate) .Marland Kitchen.. 1 Tab Once Daily 5)  Digoxin 0.125 Mg Tabs (Digoxin) .... Take 1 Tablet Once Daily 6)  Furosemide 80 Mg Tabs (Furosemide) .... Take One Tablet By Mouth Daily. 7)  Klor-Con M20 20 Meq Cr-Tabs (Potassium Chloride Crys Cr) .... Take 2 Tablets in Am and 1 Tablet in Pm 8)  Uloric 80 Mg Tabs (Febuxostat) .... One By Mouth Daily  Allergies (verified): 1)  ! * Tb    ICD Specifications Following MD:  Lewayne Bunting, MD     ICD Vendor:  Medtronic     ICD Model Number:  [id number]     ICD Serial Number:  [id number] ICD DOI:  01/03/2008      Lead 1:    Location: RA     DOI: 01/03/2008     Model #: [id number]     Serial #: [id number]     Status: active Lead 2:    Location: RV     DOI: 01/03/2008     Model #: 6213     Serial #: [id number]     Status: active Lead 3:    Location: LV     DOI: 01/03/2008     Model #: 4194     Serial #: [id number]     Status: active  Indications::  NICM   ICD Follow Up Remote Check?  No Battery Voltage:  3.10 V     Charge Time:  9.8 seconds     Underlying rhythm:  a-fib ICD Dependent:  Yes       ICD Device Measurements Atrium:  Amplitude: 2.5 mV, Impedance: 600 ohms,  Right Ventricle:  Amplitude: 7.6 mV, Impedance: 408 ohms, Threshold: 0.5 V at 0.4 msec Left Ventricle:  Impedance: 640 ohms, Threshold: 0.5 V at 0.4 msec Shock Impedance: 36/46 ohms   Episodes MS Episodes:  2171     Percent Mode Switch:  12.65     Coumadin:  No Shock:  0     ATP:  0     Nonsustained:  5     Atrial Pacing:  9.15     Ventricular Pacing:  99.65  Brady Parameters Mode DDD     Lower Rate Limit:  50     Upper Rate Limit 130 PAV 160     Sensed AV Delay:   140  Tachy Zones VF:  214     VT1:  182     Next Cardiology Appt Due:  08/01/2010 Tech Comments:  Lead impedance alert values reprogrammed.  A-fib, - coumadin.  Optivol and thoracic impedance normal.  ROV 3 months with Dr. Ladona Ridgel in [facility]. Altha Harm, LPN  May 02, 2010 2:34 PM

## 2010-10-03 NOTE — Assessment & Plan Note (Signed)
Summary: f 7 weeks   Visit Type:  Follow-up Referring Provider:  EP-Dr. Ladona Ridgel; Orthopedics-Dr. Romeo Apple Primary Provider:  Dr. [name]  CC:  NO COMPLAINTS TODAY.  History of Present Illness: [name] is a very pleasant 77 AAF who we are seeing on follow-up after last evaluation by [facility] clinic.  She had an episode where she was getting ready to use the bathroom and suddenly woke up on the floor.  Pacemaker interrogation on 10/19/2009 revealed ICD shock.X1 secondary to a V-Fib episode..  With an underlying rhythm of atrial fib.  She has had no futher episodes since that time.  She has taken her medication as directed.  She is due for lab work today for renal fx and K status.  She remains on coumadin and is followed by Dr. Renard Matter for this.   She is feeling much better since last seen. LEE has improved significantly.  She now walking and not in a wheelchair. Her legs are without pain.  She is over her UTI.Marland Kitchen Her CHF is adequately controlled at present.  She denies any DOE or chest discomfort.  She did not feel the ICD engage.  Her wrist injury is improved and she no longer wears the brace. There is still mild swelling.,  Current Medications (verified): 1)  Coumadin 4 Mg Tabs (Warfarin Sodium) .... As Directed 2)  Famotidine 20 Mg Tabs (Famotidine) .... Take 1 Tablet By Mouth Two Times A Day 3)  Multi-Vitamin/iron  Tabs (Multiple Vitamins-Iron) .Marland Kitchen.. 1 Tab Once Daily 4)  Hydralazine Hcl 100 Mg Tabs (Hydralazine Hcl) .... Take 1 Tablet Three Times A Day 5)  Carvedilol 25 Mg Tabs (Carvedilol) .... Take 1 Tablet Two Times A Day 6)  Imdur 120 Mg Xr24h-Tab (Isosorbide Mononitrate) .Marland Kitchen.. 1 Tab Once Daily 7)  Digoxin 0.125 Mg Tabs (Digoxin) .... Take 1 Tablet Once Daily 8)  Furosemide 40 Mg Tabs (Furosemide) .... Take 1 1/2 Tabs Daily 9)  Klor-Con M20 20 Meq Cr-Tabs (Potassium Chloride Crys Cr) .... Take 2 Tablets in Am and 1 Tablet in Pm  Allergies (verified): 1)  ! * Tb  Review of  Systems       One episode of ICD shock secondary to v-fib.  No other episodes.  All other systems have been reviewed and are negative unless stated above.   Vital Signs:  Patient profile:   77 year old female Weight:      217 pounds Pulse rate:   59 / minute BP sitting:   137 / 69  (right arm)  Vitals Entered By: Dreama Saa, CNA (November 05, 2009 3:52 PM)  Physical Exam  General:  Well developed, well nourished, in no acute distress. Lungs:  Clear bilaterally to auscultation and percussion. Heart:  Distant withour MRG Abdomen:  Bowel sounds positive; abdomen soft and non-tender without masses, organomegaly, or hernias noted. No hepatosplenomegaly. Msk:  L arm is better without the brace. She still has some swelling in it and some soreness with movement of hand and fingers. Extremities:  trace left pedal edema and trace right pedal edema.   Neurologic:  Alert and oriented x 3. Psych:  Normal affect.    ICD Specifications Following MD:  Lewayne Bunting, MD     ICD Vendor:  Medtronic     ICD Model Number:  [id number]     ICD Serial Number:  [id number] ICD DOI:  01/03/2008      Lead 1:    Location: RA     DOI: 01/03/2008  Model #: [id number]     Serial #: [id number]     Status: active Lead 2:    Location: RV     DOI: 01/03/2008     Model #: [id number]     Serial #: [id number]     Status: active Lead 3:    Location: LV     DOI: 01/03/2008     Model #: 4194     Serial #: [id number]     Status: active  Indications::  NICM   ICD Follow Up ICD Dependent:  Yes      Episodes Coumadin:  Yes  Brady Parameters Mode DDD     Lower Rate Limit:  50     Upper Rate Limit 130 PAV 160     Sensed AV Delay:  140  Tachy Zones VF:  214     VT1:  182     Impression & Recommendations:  Problem # 1:  CARDIOMYOPATHY-NONISCHEMIC (ICD-425.4) Assessment Improved She appears compensated at present. No evidence of CHF.  She is brighter and more energetic. Her updated medication list for this problem includes:     Coumadin 4 Mg Tabs (Warfarin sodium) .Marland Kitchen... As directed    Carvedilol 25 Mg Tabs (Carvedilol) .Marland Kitchen... Take 1 tablet two times a day    Imdur 120 Mg Xr24h-tab (Isosorbide mononitrate) .Marland Kitchen... 1 tab once daily    Digoxin 0.125 Mg Tabs (Digoxin) .Marland Kitchen... Take 1 tablet once daily    Furosemide 40 Mg Tabs (Furosemide) .Marland Kitchen... Take 1 1/2 tabs daily  Problem # 2:  HYPERTENSION (ICD-401.9) Assessment: Improved  Her updated medication list for this problem includes:    Hydralazine Hcl 100 Mg Tabs (Hydralazine hcl) .Marland Kitchen... Take 1 tablet three times a day    Carvedilol 25 Mg Tabs (Carvedilol) .Marland Kitchen... Take 1 tablet two times a day    Furosemide 40 Mg Tabs (Furosemide) .Marland Kitchen... Take 1 1/2 tabs daily  Problem # 3:  RENAL DISEASE, CHRONIC, STAGE II (ICD-585.2) Labs are to be done today.  Patient Instructions: 1)  Your physician recommends that you schedule a follow-up appointment in: 3 months 2)  Your physician recommends that you continue on your current medications as directed. Please refer to the Current Medication list given to you today.

## 2010-10-03 NOTE — Letter (Signed)
Summary: [place] Results Engineer, agricultural at [facility]  [address] [place] [address]   Phone: [phone]  Fax: [phone]      June 03, 2010 MRN: [id number]   [name] [address] [place], [place]  [address]   Dear Ms. [name],  Your test ordered by Selena Batten has been reviewed by your physician (or physician assistant) and was found to be normal or stable. Your physician (or physician assistant) felt no changes were needed at this time.  ____ Echocardiogram  ____ Cardiac Stress Test  __x__ Lab Work  ____ Peripheral vascular study of arms, legs or neck  ____ CT scan or X-ray  ____ Lung or Breathing test  ____ Other:  No change in medical treatment at this time, per Dr. Dietrich Pates.  Enclosed is a copy of your labwork for your records.  Repeat labs will be drawn in 2 months and will be sent to you in the mail.  Thank you, Tammy [name] RN    [place] Bing, MD, [name], MD, F.A.C.C Lewayne Bunting, MD, F.A.C.C Nona Dell, MD, F.A.C.C Charlton Haws, MD, [name]

## 2010-10-03 NOTE — Miscellaneous (Signed)
Summary: LABS BMP,11/05/2009  Clinical Lists Changes  Observations: Added new observation of CALCIUM: 9.1 mg/dL (04/54/0981 19:14) Added new observation of CREATININE: 1.58 mg/dL (78/29/5621 30:86) Added new observation of BUN: 25 mg/dL (57/84/6962 95:28) Added new observation of BG RANDOM: 148 mg/dL (41/32/4401 02:72) Added new observation of CO2 PLSM/SER: 23 meq/L (11/05/2009 11:53) Added new observation of CL SERUM: 102 meq/L (11/05/2009 11:53) Added new observation of K SERUM: 4.2 meq/L (11/05/2009 11:53) Added new observation of NA: 141 meq/L (11/05/2009 11:53)

## 2010-10-03 NOTE — Medication Information (Signed)
Summary: ccr-lr  Anticoagulant Therapy  Managed by: Vashti Hey, RN PCP: Dr. [name] Supervising MD: Dietrich Pates MD, Molly Maduro Indication 1: Atrial Fibrillation Lab Used: [facility] [place] Site: [place] INR POC 2.8           Allergies: 1)  ! * Tb  Anticoagulation Management History:      The patient is taking warfarin and comes in today for a routine follow up visit.  Positive risk factors for bleeding include an age of 75 years or older and history of CVA/TIA.  The bleeding index is 'intermediate risk'.  Positive CHADS2 values include History of CHF, History of HTN, Age > 75 years old, and Prior Stroke/CVA/TIA.  Her last INR was 3.21.  Anticoagulation responsible provider: Dietrich Pates MD, Molly Maduro.  INR POC: 2.8.    Anticoagulation Management Assessment/Plan:      The patient's current anticoagulation dose is Warfarin sodium 4 mg tabs: Take as directed by coumadin clinic.  The target INR is 2.0-3.0.  The next INR is due 09/25/2010.  Anticoagulation instructions were given to patient.  Results were reviewed/authorized by Vashti Hey, RN.  She was notified by Vashti Hey RN.         Prior Anticoagulation Instructions: INR 1.6 Increase coumadin to 4mg  once daily except 6mg  on Thursdays  Current Anticoagulation Instructions: INR 2.8 Continue couamdin 4mg  once daily except 6mg  on Thursdays F/U INR with Dr Renard Matter week on 09/23/10 Can not afford $40 co-pay Prescriptions: WARFARIN SODIUM 4 MG TABS (WARFARIN SODIUM) Take as directed by coumadin clinic  #100 x 1   Entered by:   Vashti Hey RN   Authorized by:   Laren Boom, MD, [facility]   Signed by:   Vashti Hey RN on 09/11/2010   Method used:   Faxed to ...       [facility] [employer] education officer)       [address] [address]       [place], [place]  [address]       Ph: [phone]       Fax: [phone]   RxID:   [id number] WARFARIN SODIUM 4 MG TABS (WARFARIN SODIUM) Take as directed by coumadin clinic  #45 x 0   Entered by:    Vashti Hey RN   Authorized by:   Laren Boom, MD, [facility]   Signed by:   Vashti Hey RN on 09/11/2010   Method used:   Electronically to        [facility] (retail)       [address]       [place], [place]  [address]       Ph: [phone]       Fax: [phone]   RxID:   [id number]

## 2010-10-03 NOTE — Medication Information (Signed)
Summary: Coumadin Clinic  Anticoagulant Therapy  Managed by: Inactive PCP: Dr. [name] Supervising MD: Dietrich Pates MD, Molly Maduro Indication 1: Atrial Fibrillation Lab Used: [facility] [place] Site: [place]          Comments: Pt will go back to Dr Renard Matter for INR checks.  Can not afford $40 co-pay.  Allergies: 1)  ! * Tb  Anticoagulation Management History:      Positive risk factors for bleeding include an age of 75 years or older and history of CVA/TIA.  The bleeding index is 'intermediate risk'.  Positive CHADS2 values include History of CHF, History of HTN, Age > 75 years old, and Prior Stroke/CVA/TIA.  Her last INR was 3.21.  Anticoagulation responsible provider: Dietrich Pates MD, Molly Maduro.    Anticoagulation Management Assessment/Plan:      The patient's current anticoagulation dose is Warfarin sodium 4 mg tabs: Take as directed by coumadin clinic.  The target INR is 2.0-3.0.  The next INR is due 09/25/2010.  Anticoagulation instructions were given to patient.  Results were reviewed/authorized by Inactive.         Prior Anticoagulation Instructions: INR 2.8 Continue couamdin 4mg  once daily except 6mg  on Thursdays F/U INR with Dr Renard Matter week on 09/23/10 Can not afford $40 co-pay

## 2010-10-03 NOTE — Letter (Signed)
Summary: [place] Future [facility] Work Engineer, agricultural at [employer]  [address] [place] [address]   Phone: [phone]  Fax: [phone]     June 03, 2010 MRN: [id number]   [facility] [address] [place], [place]  [address]      YOUR LAB WORK IS DUE   August 05, 2010  Please go to [facility], located across the street from [facility] on the second floor.  Hours are Monday - Friday 7am until 7:30pm         Saturday 8am until 12noon    __  DO NOT EAT OR DRINK AFTER MIDNIGHT EVENING PRIOR TO LABWORK  _X_ YOUR LABWORK IS NOT FASTING --YOU MAY EAT PRIOR TO LABWORK

## 2010-10-03 NOTE — Miscellaneous (Signed)
  Clinical Lists Changes  Medications: Changed medication from KLOR-CON M20 20 MEQ CR-TABS (POTASSIUM CHLORIDE CRYS CR) take 2  tablets  once daily to KLOR-CON M20 20 MEQ CR-TABS (POTASSIUM CHLORIDE CRYS CR) take 2 tablets in am and 1 tablet in pm - Signed Rx of KLOR-CON M20 20 MEQ CR-TABS (POTASSIUM CHLORIDE CRYS CR) take 2 tablets in am and 1 tablet in pm;  #90 x 3;  Signed;  Entered by: Teressa Lower RN;  Authorized by: Kathlen Brunswick, MD, [facility];  Method used: Electronically to [facility] [address], [place], [place], [place]  [address], Ph: [phone], Fax: [phone] Orders: Added new Test order of T-Basic Metabolic Panel [id number]) - Signed    Prescriptions: KLOR-CON M20 20 MEQ CR-TABS (POTASSIUM CHLORIDE CRYS CR) take 2 tablets in am and 1 tablet in pm  #90 x 3   Entered by:   Teressa Lower RN   Authorized by:   Kathlen Brunswick, MD, [facility]   Signed by:   Teressa Lower RN on 10/09/2009   Method used:   Electronically to        [facility] (retail)       [address]       [place], [place]  [address]       Ph: [phone]       Fax: [phone]   RxID:   [id number]

## 2010-10-03 NOTE — Assessment & Plan Note (Signed)
Summary: 2 mth f/u per checkout on 11/05/09/tg   Visit Type:  Follow-up Referring Provider:  EP-Dr. Ladona Ridgel; Orthopedics-Dr. Romeo Apple Primary Provider:  Dr. [name]   History of Present Illness: [name] returns to the office as scheduled for continued assessment and treatment of a nonischemic cardiomyopathy with chronic systolic congestive heart failure.  Since her last visit, she has done generally well.  She notes anorexia with weight loss.  At her most recent device assessment, increased thoracic impedance suggesting worsening of pulmonary edema was identified.  Her dose of furosemide was increased from 60 to 80 mg q.d. with improvement in pedal edema.  She denies chest pain, exertional dyspnea, orthopnea and PND.  She complains of discomfort in her ankles which she attributes to gout, malaise and fatigue.  EKG  Procedure date:  02/05/2010  Findings:      Rhythm Strip  Normal sinus rhythm with atrial synchronous ventricular pacing Premature atrial complexes  -  Date:  02/05/2010    BG Random: 94    BUN: 28    Creatinine: 1.35    Sodium: 144    Potassium: 3.8    Chloride: 107    CO2 Total: 28    SGOT (AST): 16    SGPT (ALT): 9    WBC: 5.9    HGB: 10.6    HCT: 33.2    PLT: 280    MCV: 90    Calcium: 9.3    TSH: 1.4    Magnesium 2.2   Current Medications (verified): 1)  Coumadin 4 Mg Tabs (Warfarin Sodium) .... As Directed 2)  Multi-Vitamin/iron  Tabs (Multiple Vitamins-Iron) .Marland Kitchen.. 1 Tab Once Daily 3)  Hydralazine Hcl 100 Mg Tabs (Hydralazine Hcl) .... Take 1 Tablet Three Times A Day 4)  Carvedilol 25 Mg Tabs (Carvedilol) .... Take 1 Tablet Two Times A Day 5)  Imdur 120 Mg Xr24h-Tab (Isosorbide Mononitrate) .Marland Kitchen.. 1 Tab Once Daily 6)  Digoxin 0.125 Mg Tabs (Digoxin) .... Take 1 Tablet Once Daily 7)  Furosemide 80 Mg Tabs (Furosemide) .... Take One Tablet By Mouth Daily. 8)  Klor-Con M20 20 Meq Cr-Tabs (Potassium Chloride Crys Cr) .... Take 2 Tablets in  Am and 1 Tablet in Pm 9)  Allopurinol 300 Mg Tabs (Allopurinol) .... Take 1 Tab Daily  Allergies (verified): 1)  ! * Tb  Past History:  PMH, FH, and Social History reviewed and updated.  Past Medical History: Nonischemic cardiomyopathy: Initial diagnosis in 1992; normal coronary angiography in 09/2001; automatic implantable cardiac defibrillator/biventricular pacemaker implanted in 12/2007; ejection fraction 20% in 1992, 15-20% in 2002, 20-25% in 2008" substantially improved" with a value of 40-45% in 01/2009. ____________________________________________ HYPERTENSION (ICD-401.9) AUTOMATIC IMPLANTABLE CARDIAC DEFIBRILLATOR Chronic kidney disease: Creatinine of 1.4 in 3/08, 2.1 in 2/09, 1.55 and 3/10 and 1.4 in 7/10 Digoxin toxicity-2009 Cerebrovascular accident in 1992 PSVT-status post radiofrequency ablation Gout-uric acid of 10 in 6/11 Diabetes-dietary management ANEMIA (ICD-285.9): Hemoglobin of 11/33 and 11/09 Left upper extremity thrombus Fall in bathroom-08/2009  Review of Systems       See history of present illness.  Vital Signs:  Patient profile:   77 year old female Weight:      196 pounds O2 Sat:      95 % on Room air Pulse rate:   82 / minute BP sitting:   134 / 72  (right arm)  Vitals Entered By: Dreama Saa, CNA (February 05, 2010 11:10 AM)  O2 Flow:  Room air  Physical Exam  General:   General-Well developed; large frame;no acute distress:   Neck-minimal JVD; no carotid bruits: Lungs-No tachypnea, no rales; no rhonchi; no wheezes: Cardiovascular-normal PMI; normal S1 and S2; grade 1-2/6 systolic ejection murmur at the cardiac base; grade 1-2/6 holosystolic murmur at the apex; rhythm and irregularities Abdomen-BS normal; soft and non-tender without masses or organomegaly:  Musculoskeletal-No deformities, no cyanosis or clubbing: Neurologic-Normal cranial nerves; symmetric strength and tone:  Skin-Warm, no significant lesions: Extremities-Nl distal pulses;  1/2+ edema:      ICD Specifications Following MD:  Lewayne Bunting, MD     ICD Vendor:  Medtronic     ICD Model Number:  [id number]     ICD Serial Number:  [id number] ICD DOI:  01/03/2008      Lead 1:    Location: RA     DOI: 01/03/2008     Model #: [id number]     Serial #: [id number]     Status: active Lead 2:    Location: RV     DOI: 01/03/2008     Model #: [id number]     Serial #: [id number]     Status: active Lead 3:    Location: LV     DOI: 01/03/2008     Model #: [id number]     Serial #: [id number]     Status: active  Indications::  NICM   ICD Follow Up ICD Dependent:  Yes      Episodes Coumadin:  Yes  Brady Parameters Mode DDD     Lower Rate Limit:  50     Upper Rate Limit 130 PAV 160     Sensed AV Delay:  140  Tachy Zones VF:  214     VT1:  182     Impression & Recommendations:  Problem # 1:  CARDIOMYOPATHY-NONISCHEMIC (ICD-425.4) CHF remains compensated and stable.  Since her dose of furosemide has been increased, a chemistry profile and BNP level will be repeated.  Rather than take 2 40 mg tablets of furosemide, she will be given a new prescription for 80 mg tablets one q.d.  Problem # 2:  AUTOMATIC IMPLANTABLE CARDIAC DEFIBRILLATOR SITU (ICD-V45.02) Her device was recently interrogated and continues to be followed appropriately by our EP service.  Problem # 3:  COUMADIN THERAPY (ICD-V58.61) As indicated in my previous note, her indication for lifelong anticoagulation is not compelling.  Warfarin will be discontinued.  Problem # 4:  HYPERTENSION (ICD-401.9) Blood pressure control is good and will likely improve as she continues to lose weight.  Unfortunately, she has not intentionally restricting her caloric intake.  Anorexia may be due to chronic congestive heart failure.  In the absence of a specific symptom to evaluate, performing multiple tests seeking an etiology for her weight loss would not likely be productive.  Problem # 5:  GOUT, UNSPECIFIED (ICD-274.9) Her chronic ankle pain is  somewhat atypical for gout and has not been adequately controlled with current therapy.  A uric acid level will be measured.  She would probably benefit from consultation with a rheumatologist.  I will plan to see this nice woman again in 3 months.  Other Orders: T-Comprehensive Metabolic Panel [id number]) T-CBC w/Diff [id number]) T-TSH [id number]) T-Magnesium [id number]) T- * Misc. Laboratory test [id number])  Patient Instructions: 1)  Your physician recommends that you schedule a follow-up appointment in: 3 months 2)  Your physician recommends that you return for lab work TD:DUKGU 3)  Your physician has recommended you make the following change in your medication: increase  furosemide to 80mg  daily with next refill, stop coumadin Prescriptions: FUROSEMIDE 80 MG TABS (FUROSEMIDE) Take one tablet by mouth daily.  #90 x 3   Entered by:   Teressa Lower RN   Authorized by:   Kathlen Brunswick, MD, [facility]   Signed by:   Teressa Lower RN on 02/05/2010   Method used:   Print then Give to Patient   RxID:   [id number] FUROSEMIDE 80 MG TABS (FUROSEMIDE) Take one tablet by mouth daily.  #30 x 3   Entered by:   Teressa Lower RN   Authorized by:   Kathlen Brunswick, MD, [facility]   Signed by:   Teressa Lower RN on 02/05/2010   Method used:   Electronically to        [facility]* (retail)       [address]       [place], [place]  [address]       Ph: [phone]       Fax: [phone]   RxID:   [id number]

## 2010-10-03 NOTE — Miscellaneous (Signed)
Summary: labs cbcd,cmp,magnesium,tsh,02/05/2010  Clinical Lists Changes  Observations: Added new observation of MAGNESIUM: 2.2 mg/dL (16/06/9603 5:40) Added new observation of CALCIUM: 9.3 mg/dL (98/07/9146 8:29) Added new observation of ALBUMIN: 3.9 g/dL (56/21/3086 5:78) Added new observation of PROTEIN, TOT: 6.8 g/dL (46/96/2952 8:41) Added new observation of SGPT (ALT): 9 units/L (02/05/2010 9:36) Added new observation of SGOT (AST): 16 units/L (02/05/2010 9:36) Added new observation of ALK PHOS: 93 units/L (02/05/2010 9:36) Added new observation of CREATININE: 1.35 mg/dL (32/44/0102 7:25) Added new observation of BUN: 28 mg/dL (36/64/4034 7:42) Added new observation of BG RANDOM: 94 mg/dL (59/56/3875 6:43) Added new observation of CO2 PLSM/SER: 23 meq/L (02/05/2010 9:36) Added new observation of CL SERUM: 107 meq/L (02/05/2010 9:36) Added new observation of K SERUM: 3.8 meq/L (02/05/2010 9:36) Added new observation of NA: 144 meq/L (02/05/2010 9:36) Added new observation of TSH: 1.417 microintl units/mL (02/05/2010 9:36) Added new observation of ABSOLUTE BAS: 0.0 K/uL (02/05/2010 9:36) Added new observation of BASOPHIL %: 1 % (02/05/2010 9:36) Added new observation of EOS ABSLT: 0.2 K/uL (02/05/2010 9:36) Added new observation of % EOS AUTO: 4 % (02/05/2010 9:36) Added new observation of ABSOLUTE MON: 0.5 K/uL (02/05/2010 9:36) Added new observation of MONOCYTE %: 9 % (02/05/2010 9:36) Added new observation of ABS LYMPHOCY: 2.1 K/uL (02/05/2010 9:36) Added new observation of LYMPHS %: 35 % (02/05/2010 9:36) Added new observation of PLATELETK/UL: 288 K/uL (02/05/2010 9:36) Added new observation of RDW: 15.3 % (02/05/2010 9:36) Added new observation of MCHC RBC: 31.9 g/dL (32/95/1884 1:66) Added new observation of MCV: 90.2 fL (02/05/2010 9:36) Added new observation of HCT: 33.2 % (02/05/2010 9:36) Added new observation of HGB: 10.6 g/dL (02/29/1600 0:93) Added new observation of  RBC M/UL: 3.68 M/uL (02/05/2010 9:36) Added new observation of WBC COUNT: 5.9 10*3/microliter (02/05/2010 9:36)

## 2010-10-03 NOTE — Procedures (Signed)
Summary: 3 MTH F/U PER CHECKOUT ON 10/19/09/TG   Current Medications (verified): 1)  Coumadin 4 Mg Tabs (Warfarin Sodium) .... As Directed 2)  Multi-Vitamin/iron  Tabs (Multiple Vitamins-Iron) .Marland Kitchen.. 1 Tab Once Daily 3)  Hydralazine Hcl 100 Mg Tabs (Hydralazine Hcl) .... Take 1 Tablet Three Times A Day 4)  Carvedilol 25 Mg Tabs (Carvedilol) .... Take 1 Tablet Two Times A Day 5)  Imdur 120 Mg Xr24h-Tab (Isosorbide Mononitrate) .Marland Kitchen.. 1 Tab Once Daily 6)  Digoxin 0.125 Mg Tabs (Digoxin) .... Take 1 Tablet Once Daily 7)  Furosemide 40 Mg Tabs (Furosemide) .... Take 1 1/2 Tabs Daily 8)  Klor-Con M20 20 Meq Cr-Tabs (Potassium Chloride Crys Cr) .... Take 2 Tablets in Am and 1 Tablet in Pm  Allergies (verified): 1)  ! * Tb    ICD Specifications Following MD:  Lewayne Bunting, MD     ICD Vendor:  [employer]onic     ICD Model Number:  [id number]     ICD Serial Number:  [id number] H ICD DOI:  01/03/2008      Lead 1:    Location: RA     DOI: 01/03/2008     Model #: [id number]     Serial #: [id number]     Status: active Lead 2:    Location: RV     DOI: 01/03/2008     Model #: 4259     Serial #: [id number]     Status: active Lead 3:    Location: LV     DOI: 01/03/2008     Model #: 4194     Serial #: [id number]     Status: active  Indications::  NICM   ICD Follow Up Remote Check?  No Battery Voltage:  3.11 V     Charge Time:  9.3 seconds     Underlying rhythm:  A-fib/dependent ICD Dependent:  Yes       ICD Device Measurements Atrium:  Amplitude: 2.2 mV, Impedance: 472 ohms,  Right Ventricle:  Impedance: 400 ohms, Threshold: 1.0 V at 0.4 msec Left Ventricle:  Impedance: 624 ohms, Threshold: 1.0 V at 0.4 msec Shock Impedance: 34/42 ohms   Episodes MS Episodes:  5884     Percent Mode Switch:  17.85     Coumadin:  Yes Shock:  0     ATP:  0     Nonsustained:  3     ICD Appropriate Therapy?  Yes Atrial Pacing:  14.35     Ventricular Pacing:  98.9%  Brady Parameters Mode DDD     Lower Rate Limit:  50     Upper Rate  Limit 130 PAV 160     Sensed AV Delay:  140  Tachy Zones VF:  214     VT1:  182     Next Cardiology Appt Due:  04/01/2010 Tech Comments:  Optivol and thoracic impedance abnormal on going since 12/11/09. Her ankles and feet were edametous and she was extremely SOB walking back to the exam room today.   She is to increase her furosemide to 2 tablets(80mg ) daily until next month when she sees Dr. Dietrich Pates.  Atrial sensitivity reprogrammed 0.5mv for FFRW.  3 NSVT episodes the longest 5 seconds.  A-fib today, + coumadin.  ROV 3 months in the [facility] clinic. Altha Harm, LPN  Jan 07, 6605 9:25 AM  MD Comments:  Agree with above.  Patient Instructions: 1)  Increase furosemide to 2 pills daily until you see Dr. Dietrich Pates in one month.  Return for a clinic visit in 3 months. 2)  Altha Harm, LPN  Jan 08, 980 9:17 AM

## 2010-10-03 NOTE — Miscellaneous (Signed)
Summary: LABS BMP,10/08/2009  Clinical Lists Changes  Observations: Added new observation of CALCIUM: 9.3 mg/dL (40/98/1191 47:82) Added new observation of CREATININE: 1.59 mg/dL (95/62/1308 65:78) Added new observation of BUN: 33 mg/dL (46/96/2952 84:13) Added new observation of BG RANDOM: 115 mg/dL (24/40/1027 25:36) Added new observation of CO2 PLSM/SER: 27 meq/L (10/08/2009 16:49) Added new observation of CL SERUM: 105 meq/L (10/08/2009 16:49) Added new observation of K SERUM: 3.5 meq/L (10/08/2009 16:49) Added new observation of NA: 144 meq/L (10/08/2009 16:49) Added new observation of LDL: 155 mg/dL (64/40/3474 25:95) Added new observation of HDL: 37 mg/dL (63/87/5643 32:95) Added new observation of TRIGLYC TOT: 190 mg/dL (18/84/1660 63:01) Added new observation of CHOLESTEROL: 230 mg/dL (60/06/9322 55:73)

## 2010-10-03 NOTE — Assessment & Plan Note (Signed)
Summary: 6 WK F/U AFTER MED/CIGNA/BSF   Referring Provider:  EP-Dr. Ladona Ridgel; Orthopedics-Dr. Romeo Apple Primary Provider:  Dr. [name]  CC:  left wrist and forearm pain .  History of Present Illness: I saw [name] in the office today for a followup visit.  She is a 77 years old woman with the complaint of:  DX: left wrist sprain.  Treatment: fell December 4th 2010, removable splint brace  MEDS:  Prednisone 10mg  has a few more left.  Complaints:  doing better, still pain.  Today, scheduled for: recheck after bracing and Prednisone regimen. [facility] did not have a platform walker.  Mild change in wrist with less swelling and tenderness  Assessment:  improved   paln continue prednisone     Allergies: 1)  ! * Tb   Impression & Recommendations:  Problem # 1:  WRIST SPRAIN, LEFT (ICD-842.00) Assessment Improved  Orders: Est. Patient Level II (91478)  Problem # 2:  GOUT, UNSPECIFIED (ICD-274.9) Assessment: Improved  Orders: Est. Patient Level II (29562)  Medications Added to Medication List This Visit: 1)  Prednisone 10 Mg Tabs (Prednisone) .Marland Kitchen.. 1 by mouth q day  Patient Instructions: 1)  Continue splint and prednisone  2)  return in 6 weeks  Prescriptions: PREDNISONE 10 MG TABS (PREDNISONE) 1 by mouth q day  #42 x 0   Entered and Authorized by:   Fuller Canada MD   Signed by:   Fuller Canada MD on 09/20/2009   Method used:   Faxed to ...       [facility]* (retail)       [address]       [place], [place]  [address]       Ph: [phone]       Fax: [phone]   RxID:   [id number]

## 2010-10-03 NOTE — Assessment & Plan Note (Signed)
Summary: 1 MTH F/U PER CHECKOUT ON 08/10/09/TG   Visit Type:  Follow-up Referring Provider:  EP-Dr. Ladona Ridgel; Orthopedics-Dr. Romeo Apple Primary Provider:  Dr. [name]   History of Present Illness: Scheduled return visit for this delightful 77 year old woman with an advanced nonischemic cardiomyopathy.  Fortunately, she has done well with respect to congestive heart failure, which is now adequately controlled.  Unfortunately, she continues to have pain and swelling in her left wrist, now more than one month following trauma resulting from a fall.  Initial x-ray was negative.  She is scheduled to be reevaluated by Dr. Romeo Apple in the next week or so.   Current Medications (verified): 1)  Coumadin 4 Mg Tabs (Warfarin Sodium) .... As Directed 2)  Famotidine 20 Mg Tabs (Famotidine) .... Take 1 Tablet By Mouth Two Times A Day 3)  Multi-Vitamin/iron  Tabs (Multiple Vitamins-Iron) .Marland Kitchen.. 1 Tab Once Daily 4)  Hydralazine Hcl 100 Mg Tabs (Hydralazine Hcl) .... Take 1 Tablet Three Times A Day 5)  Carvedilol 25 Mg Tabs (Carvedilol) .... Take 1 Tablet Two Times A Day 6)  Imdur 120 Mg Xr24h-Tab (Isosorbide Mononitrate) .Marland Kitchen.. 1 Tab Once Daily 7)  Digoxin 0.125 Mg Tabs (Digoxin) .... Take 1 Tablet Once Daily 8)  Furosemide 40 Mg Tabs (Furosemide) .... Take 1 1/2 Tabs Daily 9)  Klor-Con M20 20 Meq Cr-Tabs (Potassium Chloride Crys Cr) .... Take 2  Tablets  Once Daily  Allergies (verified): 1)  ! * Tb  Past History:  PMH, FH, and Social History reviewed and updated.  Review of Systems  The patient denies weight loss, weight gain, vision loss, decreased hearing, chest pain, syncope, dyspnea on exertion, prolonged cough, and headaches.         Has not monitored her weight very carefully-she was admonished and advised to do so.  She notes minimal pedal edema  Vital Signs:  Patient profile:   77 year old female Weight:      215 pounds BMI:     31.86 Pulse rate:   54 / minute BP sitting:   134 / 52   (right arm)  Vitals Entered By: Dreama Saa, CNA (September 14, 2009 11:33 AM)  Physical Exam  General:  General-Well developed; no acute distress; overweight   Neck-No JVD; no carotid bruits; no HJR Lungs-No tachypnea, no rales; no rhonchi; no wheezes: Cardiovascular- normal S1and S2; modest basilar systolic murmur Abdomen-BS normal; soft and non-tender without masses or organomegaly:  Musculoskeletal-splint on left forearm; mild swelling of the fingers; unable to fully flex fingers, and movement at the wrist is minimal without pain; point tenderness over the mid dorsum.   no cyanosis or clubbing;  Skin-Warm, no significant lesions: Extremities-Nl distal pulses; 1/2 + edema:     ICD Specifications Following MD:  Lewayne Bunting, MD     ICD Vendor:  Medtronic     ICD Model Number:  [id number]     ICD Serial Number:  [id number] H ICD DOI:  01/03/2008      Lead 1:    Location: RA     DOI: 01/03/2008     Model #: 4098     Serial #: [id number]     Status: active Lead 2:    Location: RV     DOI: 01/03/2008     Model #: 6213     Serial #: [id number]     Status: active Lead 3:    Location: LV     DOI: 01/03/2008     Model #: 6295  Serial #: [id number]     Status: active  Indications::  NICM   ICD Follow Up ICD Dependent:  Yes      Episodes Coumadin:  Yes  Brady Parameters Mode DDD     Lower Rate Limit:  50     Upper Rate Limit 130 PAV 160     Sensed AV Delay:  140  Tachy Zones VF:  214     VT1:  182     Impression & Recommendations:  Problem # 1:  CARDIOMYOPATHY-NONISCHEMIC (ICD-425.4) Doing well with a 5 pound weight loss, excellent control of symptoms and peripheral edema and a stable medical regime, which will be continued.  She has borderline hypokalemia, but has had hyperkalemia in the past.  We will continue to monitor her electrolytes.  Problem # 2:  AUTOMATIC IMPLANTABLE CARDIAC DEFIBRILLATOR SITU (ICD-V45.02) Follow up appointment with electrophysiology will be  verified.  Problem # 3:  COUMADIN THERAPY (ICD-V58.61) She has taken warfarin for many years due to a remote CVA.  This does not appear to be an adequate reason to continue this drug indefinitely.  She also has a relative indication with respect to cardiomyopathy, but in the setting of falls, the risk-benefit ratio is probably unfavorable.  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  I will discuss with Dr. Ladona Ridgel whether that mandates indefinite anticoagulation.  09/17/2008-case discussed with Dr. Ladona Ridgel.  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  Problem # 4:  ANEMIA (ICD-285.9) Anemia continues to improve with hemoglobin most recently in excess of 11.  Problem # 5:  HYPERTENSION (ICD-401.9) Blood pressure control is good; current medications will be continued.  Problem # 6:  RENAL DISEASE, CHRONIC, STAGE II (ICD-585.2) Most recent creatinine is 1.65 and is significantly improved.  We will continue to monitor.  Problem # 7:  WRIST SPRAIN, LEFT (ICD-842.00) Wrist is not significantly better after 5--6 weeks of rest.  Consideration of an occult fracture or more serious injury needs to be entertained.  Dr. Romeo Apple will be seeing her in the near future.  She will certainly require outpatient physical therapy at a minimum.  I will plan to see this nice woman again in 7 weeks.  Other Orders: Future Orders: T-Lipid Profile ([id number]) ... 10/05/2009 T-Basic Metabolic Panel [id number]) ... 10/05/2009 T-Digoxin ([id number]) ... 10/05/2009  Patient Instructions: 1)  Your physician recommends that you schedule a follow-up appointment in: 7 WEEKS 2)  Your physician recommends that you return for lab work in: 3 WEEKS

## 2010-10-03 NOTE — Cardiovascular Report (Signed)
Summary: Office Visit   Office Visit   Imported By: Roderic Ovens 08/28/2010 11:16:53  _____________________________________________________________________  External Attachment:    Type:   Image     Comment:   External Document

## 2010-10-03 NOTE — Miscellaneous (Signed)
Summary: Home Care Report  Home Care Report   Imported By: Faythe Ghee 10/03/2009 11:41:02  _____________________________________________________________________  External Attachment:    Type:   Image     Comment:   External Document

## 2010-10-03 NOTE — Cardiovascular Report (Signed)
Summary: Office Visit   Office Visit   Imported By: Roderic Ovens 10/29/2009 12:38:15  _____________________________________________________________________  External Attachment:    Type:   Image     Comment:   External Document

## 2010-10-03 NOTE — Procedures (Signed)
Summary: 3 MTH F/U PER CHECKOUT ON 05/02/10/TG   Visit Type:  Follow-up Referring Ramsey Guadamuz:  EP-Dr. Ladona Ridgel; Orthopedics-Dr. Romeo Apple Primary Hallie Ertl:  Dr. [name]   History of Present Illness: [name] returns today for followup.  She is a pleasant 77 woman with a h/o DCM, Atrial fib, HTN, and CHF.  She has class 2 CHF symptoms. She does not have palpitations. She has continued to lose weight. No other complaints.  She has had no intercurrent ICD therapies.  Current Medications (verified): 1)  Multi-Vitamin/iron  Tabs (Multiple Vitamins-Iron) .Marland Kitchen.. 1 Tab Once Daily 2)  Hydralazine Hcl 100 Mg Tabs (Hydralazine Hcl) .... Take 1 Tablet Three Times A Day 3)  Carvedilol 25 Mg Tabs (Carvedilol) .... Take 1 Tablet Two Times A Day 4)  Imdur 120 Mg Xr24h-Tab (Isosorbide Mononitrate) .Marland Kitchen.. 1 Tab Once Daily 5)  Digoxin 0.125 Mg Tabs (Digoxin) .... Take 1 Tablet Once Daily 6)  Furosemide 80 Mg Tabs (Furosemide) .... Take One Tablet By Mouth Daily. 7)  Klor-Con M20 20 Meq Cr-Tabs (Potassium Chloride Crys Cr) .... Take 2 Tablets in Am 8)  Uloric 80 Mg Tabs (Febuxostat) .... One By Mouth Daily 9)  Coumadin 5 Mg Tabs (Warfarin Sodium) .... Take As Directed By Coumadin Clinic  Allergies (verified): 1)  ! * Tb  Comments:  Nurse/Medical Assistant: patient brought all med bottles [facility]  Past History:  Past Medical History: Last updated: 02/05/2010 Nonischemic cardiomyopathy: Initial diagnosis in 1992; normal coronary angiography in 09/2001; automatic implantable cardiac defibrillator/biventricular pacemaker implanted in 12/2007; ejection fraction 20% in 1992, 15-20% in 2002, 20-25% in 2008" substantially improved" with a value of 40-45% in 01/2009. ____________________________________________ HYPERTENSION (ICD-401.9) AUTOMATIC IMPLANTABLE CARDIAC DEFIBRILLATOR Chronic kidney disease: Creatinine of 1.4 in 3/08, 2.1 in 2/09, 1.55 and 3/10 and 1.4 in 7/10 Digoxin  toxicity-2009 Cerebrovascular accident in 1992 PSVT-status post radiofrequency ablation Gout-uric acid of 10 in 6/11 Diabetes-dietary management ANEMIA (ICD-285.9): Hemoglobin of 11/33 and 11/09 Left upper extremity thrombus Fall in bathroom-08/2009  Past Surgical History: Last updated: 09/14/2009 Hysterectomy AICD placement (4/09)-[employer]  Review of Systems       The patient complains of dyspnea on exertion.  The patient denies chest pain, syncope, and peripheral edema.    Vital Signs:  Patient profile:   77 year old female Weight:      206 pounds O2 Sat:      98 % on Room air Pulse rate:   73 / minute BP sitting:   139 / 58  (right arm)  Vitals Entered By: Dreama Saa, CNA (August 20, 2010 3:14 PM)  O2 Flow:  Room air  Physical Exam  General:  Obese; well-developed; large frame;no acute distress:   Neck-no JVD; no carotid bruits: Lungs-No tachypnea, no rales; no rhonchi; no wheezes: Cardiovascular-normal PMI; normal S1 and S2; rhythm irregularity;  grade 1-2/6 holosystolic murmur at the apex; Abdomen-BS normal; soft and non-tender without masses or organomegaly:  Musculoskeletal-No deformities, no cyanosis or clubbing: Neurologic-Normal cranial nerves; symmetric strength and tone:  Skin-Warm, no significant lesions: Extremities-Nl distal pulses; 1/2+ edema:      ICD Specifications Following MD:  Lewayne Bunting, MD     ICD Vendor:  [employer]     ICD Model Number:  [id number]     ICD Serial Number:  [id number] ICD DOI:  01/03/2008      Lead 1:    Location: RA     DOI: 01/03/2008     Model #: 4098  Serial #: [id number]     Status: active Lead 2:    Location: RV     DOI: 01/03/2008     Model #: 0981     Serial #: [id number]     Status: active Lead 3:    Location: LV     DOI: 01/03/2008     Model #: 6213     Serial #: [id number]     Status: active  Indications::  NICM   ICD Follow Up Remote Check?  No Battery Voltage:  3.10 V     Charge Time:  9.8 seconds      Underlying rhythm:  A-fib ICD Dependent:  Yes       ICD Device Measurements Atrium:  Amplitude: 3.0 mV, Impedance: 544 ohms,  Right Ventricle:  Amplitude: 9.6 mV, Impedance: 376 ohms, Threshold: 1.0 V at 0.4 msec Left Ventricle:  Impedance: 528 ohms, Threshold: 1.5 V at 0.4 msec Shock Impedance: 34/43 ohms   Episodes MS Episodes:  1401     Percent Mode Switch:  16.1%     Coumadin:  No Shock:  0     ATP:  0     Nonsustained:  0     Atrial Pacing:  8.6%     Ventricular Pacing:  98.1%  Brady Parameters Mode DDD     Lower Rate Limit:  50     Upper Rate Limit 130 PAV 160     Sensed AV Delay:  140  Tachy Zones VF:  214     VT1:  182     Next Cardiology Appt Due:  10/31/2010 Tech Comments:  No parameter changes.  Device function normal.   Optivol nearing threshold level, thoracic impedance down.  [name] is c/o SOB.   A-fib some RVR noted, - coumadin.   No Carelink @ this time. ROV 3 months RDS clinic. Altha Harm, LPN  August 20, 2010 3:35 PM  MD Comments:  Agree with above.  Impression & Recommendations:  Problem # 1:  ATRIAL FIBRILLATION (ICD-427.31) She continues to have documented atrial fib by her ICD interogation.  She does not have symptoms. She will continue her current meds and I will ask her to start coumadin. Her updated medication list for this problem includes:    Carvedilol 25 Mg Tabs (Carvedilol) .Marland Kitchen... Take 1 tablet two times a day    Digoxin 0.125 Mg Tabs (Digoxin) .Marland Kitchen... Take 1 tablet once daily    Coumadin 5 Mg Tabs (Warfarin sodium) .Marland Kitchen... Take as directed by coumadin clinic  Problem # 2:  AUTOMATIC IMPLANTABLE CARDIAC DEFIBRILLATOR SITU (ICD-V45.02) Her device is working normally.  Will follow.  Problem # 3:  CHRONIC SYSTOLIC HEART FAILURE (ICD-428.22) Her symptoms are class 2.  I have asked her to lose weight, maintain a low sodium diet and continue her current meds. Her updated medication list for this problem includes:    Carvedilol 25 Mg Tabs  (Carvedilol) .Marland Kitchen... Take 1 tablet two times a day    Imdur 120 Mg Xr24h-tab (Isosorbide mononitrate) .Marland Kitchen... 1 tab once daily    Digoxin 0.125 Mg Tabs (Digoxin) .Marland Kitchen... Take 1 tablet once daily    Furosemide 80 Mg Tabs (Furosemide) .Marland Kitchen... Take one tablet by mouth daily.    Coumadin 5 Mg Tabs (Warfarin sodium) .Marland Kitchen... Take as directed by coumadin clinic  Patient Instructions: 1)  Your physician recommends that you schedule a follow-up appointment in: 3 months for device check and in 6 months with Dr. Ladona Ridgel 2)  Your  physician has recommended you make the following change in your medication: Restart taking Coumadin at 5mg  by mouth once daily

## 2010-10-03 NOTE — Letter (Signed)
Summary: [facility] at [employer]  [address] [place] [address]   Phone: [phone]  Fax: [phone]     September 14, 2009 MRN: [id number]   [name] [address] [place], [place]  [address]      YOUR LAB WORK IS DUE   _____________FEBRUARY 2, 2011____________________________  Please go to [facility], located across the street from [facility] on the second floor.  Hours are Monday - Friday 7am until 7:30pm         Saturday 8am until 12noon    _X_  DO NOT EAT OR DRINK AFTER MIDNIGHT EVENING PRIOR TO LABWORK  __ YOUR LABWORK IS NOT FASTING --YOU MAY EAT PRIOR TO LABWORK

## 2010-10-03 NOTE — Medication Information (Signed)
Summary: new to coumadin per checkout on 08/20/10/tg  Anticoagulant Therapy  Managed by: Vashti Hey, RN PCP: Dr. [name] Supervising MD: Dietrich Pates MD, Molly Maduro Indication 1: Atrial Fibrillation Lab Used: [facility] [place] Site: [place] INR POC 1.6  Dietary changes: no    Health status changes: no    Bleeding/hemorrhagic complications: no    Recent/future hospitalizations: no    Any changes in medication regimen? yes       Details: Started on coumadin 4mg  qd on 08/20/10 by Dr Ladona Ridgel  Recent/future dental: no  Any missed doses?: no       Is patient compliant with meds? yes       Allergies: 1)  ! * Tb  Anticoagulation Management History:      The patient is taking warfarin and comes in today for a routine follow up visit.  Positive risk factors for bleeding include an age of 75 years or older and history of CVA/TIA.  The bleeding index is 'intermediate risk'.  Positive CHADS2 values include History of CHF, History of HTN, Age > 60 years old, and Prior Stroke/CVA/TIA.  Her last INR was 3.21.  Anticoagulation responsible provider: Dietrich Pates MD, Molly Maduro.  INR POC: 1.6.  Cuvette Lot#: [id number].    Anticoagulation Management Assessment/Plan:      The patient's current anticoagulation dose is Warfarin sodium 4 mg tabs: Take as directed by coumadin clinic.  The target INR is 2.0-3.0.  The next INR is due 09/11/2010.  Anticoagulation instructions were given to patient.  Results were reviewed/authorized by Vashti Hey, RN.  She was notified by Vashti Hey RN.         Current Anticoagulation Instructions: INR 1.6 Increase coumadin to 4mg  once daily except 6mg  on Thursdays

## 2010-10-03 NOTE — Letter (Signed)
Summary: Appointment - Missed  [facility] [place]  [address], [place] [address]   Phone: [phone]  Fax: [phone]     April 11, 2010 MRN: [id number]   AKIKO SCHEXNIDER [address] [place], [place]  [address]   Dear Ms. [facility],  Our records indicate you missed your appointment on           04/11/10 PACE MAKER CHECK         It is very important that we reach you to reschedule this appointment. We look forward to participating in your health care needs. Please contact us at the number listed above at your earliest convenience to reschedule this appointment.     Sincerely,    Glass blower/designer

## 2010-10-03 NOTE — Assessment & Plan Note (Signed)
Summary: 3 mth fu from checkout on 02/05/2010/sn   Visit Type:  Follow-up Referring Provider:  EP-Dr. Ladona Ridgel; Orthopedics-Dr. Romeo Apple Primary Provider:  Dr. [name]   History of Present Illness: [name] returns to the office as scheduled for continued assessment and treatment of cardiomyopathy.  Since her last visit, she has done fairly well.  She continues to experience intermittent and variable dyspnea on exertion, but no orthopnea nor PND.  Pedal edema has been well controlled.  Assessments of her AICD have revealed stable and normal transthoracic impedance.  She denies palpitations, lightheadedness and syncope.  Her most recent assessment of LV function in mid 2010 revealed marked improvement with an EF around 45%.  Current Medications (verified): 1)  Multi-Vitamin/iron  Tabs (Multiple Vitamins-Iron) .Marland Kitchen.. 1 Tab Once Daily 2)  Hydralazine Hcl 100 Mg Tabs (Hydralazine Hcl) .... Take 1 Tablet Three Times A Day 3)  Carvedilol 25 Mg Tabs (Carvedilol) .... Take 1 Tablet Two Times A Day 4)  Imdur 120 Mg Xr24h-Tab (Isosorbide Mononitrate) .Marland Kitchen.. 1 Tab Once Daily 5)  Digoxin 0.125 Mg Tabs (Digoxin) .... Take 1 Tablet Once Daily 6)  Furosemide 80 Mg Tabs (Furosemide) .... Take One Tablet By Mouth Daily. 7)  Klor-Con M20 20 Meq Cr-Tabs (Potassium Chloride Crys Cr) .... Take 2 Tablets in Am 8)  Uloric 80 Mg Tabs (Febuxostat) .... One By Mouth Daily  Allergies (verified): 1)  ! * Tb  Comments:  Nurse/Medical Assistant: patient brought med bottles uloric 80 mg for gout started by Dr.Mcinnis potassium 20 meq 2 tabs am changed from last ov of 2 tabs am 1 tab pm  Past History:  PMH, FH, and Social History reviewed and updated.  Review of Systems       See history of present illness  Vital Signs:  Patient profile:   77 year old female Weight:      205 pounds BMI:     30.38 Pulse rate:   80 / minute BP sitting:   145 / 87  (right arm)  Vitals Entered By: Dreama Saa,  CNA (May 31, 2010 11:29 AM)  Physical Exam  General:  Obese; well-developed; large frame;no acute distress:   Neck-no JVD; no carotid bruits: Lungs-No tachypnea, no rales; no rhonchi; no wheezes: Cardiovascular-normal PMI; normal S1 and S2; rhythm irregularity;  grade 1-2/6 holosystolic murmur at the apex; Abdomen-BS normal; soft and non-tender without masses or organomegaly:  Musculoskeletal-No deformities, no cyanosis or clubbing: Neurologic-Normal cranial nerves; symmetric strength and tone:  Skin-Warm, no significant lesions: Extremities-Nl distal pulses; 1/2+ edema:     EKG  Procedure date:  05/31/2010  Findings:      Rhythm Strip  Dual-chamber pacer, predominantly with normal sinus rhythm and AV sequential pacing; occasional sinus pause; occasional PAC with AV sequential pacing    ICD Specifications Following MD:  Lewayne Bunting, MD     ICD Vendor:  [employer]     ICD Model Number:  [id number]     ICD Serial Number:  [id number] ICD DOI:  01/03/2008      Lead 1:    Location: RA     DOI: 01/03/2008     Model #: 9147     Serial #: [id number]     Status: active Lead 2:    Location: RV     DOI: 01/03/2008     Model #: 6578     Serial #: [id number]     Status: active Lead 3:    Location: LV  DOI: 01/03/2008     Model #: 9811     Serial #: [id number]     Status: active  Indications::  NICM   ICD Follow Up ICD Dependent:  Yes      Episodes Coumadin:  No  Brady Parameters Mode DDD     Lower Rate Limit:  50     Upper Rate Limit 130 PAV 160     Sensed AV Delay:  140  Tachy Zones VF:  214     VT1:  182     Impression & Recommendations:  Problem # 1:  CARDIOMYOPATHY-NONISCHEMIC (ICD-425.4) Patient is well compensated with apparent substantial recovery of left ventricular systolic function.  Medical regime is suboptimal with no ACE inhibitor and no Aldactone.  Since she has done so well, these medications will not be added.  Chemistry profile and BNP level will be  assessed.  Patient has had substantial weight gain, but there is no evidence for fluid overload.  She is now closer to her stable past weight of 210-220 pounds.  I've asked her to be more careful about monitoring weight at home.  Problem # 2:  AUTOMATIC IMPLANTABLE CARDIAC DEFIBRILLATOR SITU (ICD-V45.02) Patient has received close followup from the EP Service.  Problem # 3:  ANEMIA (ICD-285.9) Anemia has been chronic and presumed related to her mild renal insufficiency and heart disease.  CBC will be repeated.  I will plan to see this nice woman again in 4 months  Other Orders: T-Basic Metabolic Panel [phone]) T-CBC w/Diff [phone]) T-BNP  (B Natriuretic Peptide) [phone])  Patient Instructions: 1)  Your physician recommends that you schedule a follow-up appointment in: 4 months 2)  Your physician recommends that you return for lab work in: today 3)  Your physician recommends that you weigh, daily, at the same time every day, and in the same amount of clothing.  Please record your daily weights on the handout provided and bring it to your next appointment. call for 5 lbs wt gain

## 2010-10-03 NOTE — Miscellaneous (Signed)
Summary: LABS CMP,08/30/2009  Clinical Lists Changes  Observations: Added new observation of CALCIUM: 8.9 mg/dL (16/06/9603 54:09) Added new observation of ALBUMIN: 3.8 g/dL (81/19/1478 29:56) Added new observation of PROTEIN, TOT: 6.8 g/dL (21/30/8657 84:69) Added new observation of SGPT (ALT): 11 units/L (08/30/2009 16:08) Added new observation of SGOT (AST): 14 units/L (08/30/2009 16:08) Added new observation of ALK PHOS: 77 units/L (08/30/2009 16:08) Added new observation of CREATININE: 1.65 mg/dL (62/95/2841 32:44) Added new observation of BUN: 32 mg/dL (09/03/7251 66:44) Added new observation of BG RANDOM: 106 mg/dL (03/47/4259 56:38) Added new observation of CO2 PLSM/SER: 21 meq/L (08/30/2009 16:08) Added new observation of CL SERUM: 104 meq/L (08/30/2009 16:08) Added new observation of K SERUM: 3.6 meq/L (08/30/2009 16:08) Added new observation of NA: 143 meq/L (08/30/2009 16:08)

## 2010-10-04 ENCOUNTER — Encounter (INDEPENDENT_AMBULATORY_CARE_PROVIDER_SITE_OTHER): Payer: Self-pay | Admitting: *Deleted

## 2010-10-08 ENCOUNTER — Encounter: Payer: Self-pay | Admitting: Cardiology

## 2010-10-08 ENCOUNTER — Encounter (INDEPENDENT_AMBULATORY_CARE_PROVIDER_SITE_OTHER): Payer: Self-pay | Admitting: *Deleted

## 2010-10-08 ENCOUNTER — Ambulatory Visit (INDEPENDENT_AMBULATORY_CARE_PROVIDER_SITE_OTHER): Payer: Medicare Other | Admitting: Cardiology

## 2010-10-08 DIAGNOSIS — I428 Other cardiomyopathies: Secondary | ICD-10-CM

## 2010-10-09 NOTE — Miscellaneous (Signed)
Summary: labs cbcd,bmp,bnp,05/31/2010  Clinical Lists Changes  Observations: Added new observation of CALCIUM: 9.6 mg/dL (95/62/1308 6:57) Added new observation of CREATININE: 1.62 mg/dL (84/69/6295 2:84) Added new observation of BUN: 24 mg/dL (13/24/4010 2:72) Added new observation of BG RANDOM: 100 mg/dL (53/66/4403 4:74) Added new observation of CO2 PLSM/SER: 26 meq/L (05/31/2010 9:36) Added new observation of CL SERUM: 105 meq/L (05/31/2010 9:36) Added new observation of K SERUM: 4.2 meq/L (05/31/2010 9:36) Added new observation of NA: 144 meq/L (05/31/2010 9:36) Added new observation of BNP: 138.7  (05/31/2010 9:36) Added new observation of ABSOLUTE BAS: 0.1 K/uL (05/31/2010 9:36) Added new observation of BASOPHIL %: 1 % (05/31/2010 9:36) Added new observation of EOS ABSLT: 0.1 K/uL (05/31/2010 9:36) Added new observation of % EOS AUTO: 2 % (05/31/2010 9:36) Added new observation of ABSOLUTE MON: 0.5 K/uL (05/31/2010 9:36) Added new observation of MONOCYTE %: 9 % (05/31/2010 9:36) Added new observation of ABS LYMPHOCY: 1.9 K/uL (05/31/2010 9:36) Added new observation of LYMPHS %: 33 % (05/31/2010 9:36) Added new observation of PLATELETK/UL: 286 K/uL (05/31/2010 9:36) Added new observation of RDW: 13.7 % (05/31/2010 9:36) Added new observation of MCHC RBC: 32.0 g/dL (25/95/6387 5:64) Added new observation of MCV: 90.2 fL (05/31/2010 9:36) Added new observation of HCT: 35.0 % (05/31/2010 9:36) Added new observation of HGB: 11.2 g/dL (33/29/5188 4:16) Added new observation of RBC M/UL: 3.88 M/uL (05/31/2010 9:36) Added new observation of WBC COUNT: 5.8 10*3/microliter (05/31/2010 9:36)

## 2010-10-17 NOTE — Letter (Signed)
Summary: [place] [facility], [facility] at [employer]  [address], [place] [address]   Phone: [phone]  Fax: [phone]     October 08, 2010 MRN: [id number]   [name] [address] [place], [place]  [address]      YOUR LAB WORK IS DUE   November 06, 2010 Please go to [facility], located across the street from [facility] on the second floor.  Hours are Monday - Friday 7am until 7:30pm         Saturday 8am until 12noon      _X_ YOUR LABWORK IS NOT FASTING --YOU MAY EAT PRIOR TO LABWORK

## 2010-10-17 NOTE — Letter (Signed)
Summary: [facility], [facility] at [employer]  [address] [place] [address]   Phone: [phone]  Fax: [phone]     October 08, 2010 MRN: [id number]   [name] [address] [place], [place]  [address]      YOUR LAB WORK IS DUE   Jan 06, 2011  Please go to [facility], located across the street from [facility] on the second floor.  Hours are Monday - Friday 7am until 7:30pm         Saturday 8am until 12noon      _X_ YOUR LABWORK IS NOT FASTING --YOU MAY EAT PRIOR TO LABWORK

## 2010-10-23 NOTE — Assessment & Plan Note (Signed)
Summary: 4 mth. f/u per checkout on 05/31/10/tg/lv   Visit Type:  Follow-up Referring Provider:  EP-Dr. Ladona Ridgel; Orthopedics-Dr. Romeo Apple Primary Provider:  Dr. [name]   History of Present Illness: [name] returns to the office as scheduled for continued assessment and treatment of nonischemic cardiomyopathy with multiple additional medical problems.  Since her last visit, she has done remarkably well.  She now describes class II-III dyspnea on exertion, which she only experiences occasionally when she is forced to walk at a faster pace than is comfortable for her.  Pain related to degenerative joint disease of the hip and knees has improved substantially.  Quality of life is generally quite good.  She denies orthopnea, PND, lightheadedness or syncope.  She notes no palpitations nor has she developed significant pedal edema.       Current Medications (verified): 1)  Multi-Vitamin/iron  Tabs (Multiple Vitamins-Iron) .Marland Kitchen.. 1 Tab Once Daily 2)  Hydralazine Hcl 100 Mg Tabs (Hydralazine Hcl) .... Take 1 Tablet Three Times A Day 3)  Carvedilol 25 Mg Tabs (Carvedilol) .... Take 1 Tablet Two Times A Day 4)  Imdur 120 Mg Xr24h-Tab (Isosorbide Mononitrate) .Marland Kitchen.. 1 Tab Once Daily 5)  Digoxin 0.125 Mg Tabs (Digoxin) .... Take 1 Tablet Once Daily 6)  Furosemide 80 Mg Tabs (Furosemide) .... Take One Tablet By Mouth Daily. 7)  Klor-Con M20 20 Meq Cr-Tabs (Potassium Chloride Crys Cr) .... Take 2 Tablets in Am 8)  Uloric 80 Mg Tabs (Febuxostat) .... One By Mouth Daily 9)  Warfarin Sodium 4 Mg Tabs (Warfarin Sodium) .... 4mg  Once Daily Except 6mg  On Thursdays or As Directed By Anticoagulation Clinic  Allergies (verified): 1)  ! * Tb  Comments:  Nurse/Medical Assistant: patient brought meds also reviewed med list from previous ov all meds are the same she [facility]  Past History:  PMH, FH, and Social History reviewed and updated.  Past  Medical History: Nonischemic cardiomyopathy: Initial diagnosis in 1992; normal coronary angiography in 09/2001; automatic implantable cardiac defibrillator/biventricular pacemaker implanted in 12/2007; ejection fraction 20% in 1992, 15-20% in 2002, 20-25% in 2008" substantially improved" with a value of 40-45% in 01/2009. ____________________________________________ HYPERTENSION (ICD-401.9) AUTOMATIC IMPLANTABLE CARDIAC DEFIBRILLATOR Chronic kidney disease: Creatinine of 1.4 in 3/08, 2.1 in 2/09, 1.55 and 3/10 and 1.4 in 7/10 Digoxin toxicity-2009 Cerebrovascular accident in 1992 PSVT-status post radiofrequency ablation Gout-uric acid of 10 in 6/11 Diabetes-dietary management ANEMIA (ICD-285.9): Hemoglobin of 11/33 and 11/09 Left upper extremity thrombus Fall in bathroom-08/2009 Degenerative joint disease-knees  Review of Systems       See history of present illness.  Vital Signs:  Patient profile:   77 year old female Weight:      202 pounds BMI:     29.94 O2 Sat:      98 % on Room air Pulse rate:   77 / minute BP sitting:   131 / 68  (left arm)  Vitals Entered By: Dreama Saa, CNA (October 08, 2010 11:19 AM)  O2 Flow:  Room air  Physical Exam  General:  Overweight; well-developed; no acute distress Neck-no JVD; no carotid bruits: Lungs-No tachypnea, no rales; no rhonchi; no wheezes: Cardiovascular-normal PMI; normal S1 and S2; rhythm irregularity;  grade 1/6 holosystolic murmur at the apex; grade 2/6 systolic ejection murmur at the left sternal border Abdomen-BS normal; soft and non-tender without masses or organomegaly:  Musculoskeletal-No deformities, no cyanosis or clubbing: Neurologic-Normal cranial nerves; symmetric strength and tone:  Skin-Warm, no  significant lesions: Extremities-Nl distal pulses; 1/2-1+ edema:      ICD Specifications Following MD:  Lewayne Bunting, MD     ICD Vendor:  Medtronic     ICD Model Number:  [id number]     ICD Serial Number:   [id number] ICD DOI:  01/03/2008      Lead 1:    Location: RA     DOI: 01/03/2008     Model #: [id number]     Serial #: [id number]     Status: active Lead 2:    Location: RV     DOI: 01/03/2008     Model #: [id number]     Serial #: [id number]     Status: active Lead 3:    Location: LV     DOI: 01/03/2008     Model #: 1324     Serial #: [id number]     Status: active  Indications::  NICM   ICD Follow Up ICD Dependent:  Yes      Episodes Coumadin:  No  Brady Parameters Mode DDD     Lower Rate Limit:  50     Upper Rate Limit 130 PAV 160     Sensed AV Delay:  140  Tachy Zones VF:  214     VT1:  182     Impression & Recommendations:  Problem # 1:  CHRONIC SYSTOLIC HEART FAILURE (ICD-428.22) CHF continues to be well compensated despite severely depressed LV systolic function.  Current medical therapy is not optimal, but has been successful for the past 2 years.  ACE Inhibitor was discontinued at the time of a hospitalization in 2010 for CHF in the setting of a low output state and progressive renal insufficiency.  Since hospital discharge, she has done well with her current medical regimen.  Problem # 2:  ATRIAL FIBRILLATION (ICD-427.31) Heart rate control is good with current therapy.  Problem # 3:  ANTICOAGULATION (ICD-V58.61) Stool Hemoccults have not been tested for the past year-new samples will be obtained as well as a CBC.  Patient's PMD monitors warfarin dosing.  Problem # 4:  HYPERTENSION (ICD-401.9) Blood pressure control is good with current therapy, which will be continued.  BP today: 131/68 Prior BP: 139/58 (08/20/2010)  Labs Reviewed: K+: 3.7 (08/06/2010) Creat: : 1.43 (08/06/2010)     Other Orders: Hemoccult Cards (Take Home) (Hemoccult Cards) Future Orders: T-Basic Metabolic Panel [id number]) ... 11/06/2010 T-CBC w/Diff ([id number]) ... 11/06/2010 T-Basic Metabolic Panel [id number]) ... 01/06/2011  Patient Instructions: 1)  Your physician recommends that you schedule  a follow-up appointment in: 5 months 2)  Your physician recommends that you return for lab work in: 1 and 3 months 3)  Your physician has asked that you test your stool for blood. It is necessary to test 3 different stool specimens for accuracy. You will be given 3 hemoccult cards for specimen collection. For each stool specimen, place a small portion of stool sample (from 2 different areas of the stool) into the 2 squares on the card. Close card. Repeat with 2 more stool specimens. Bring the cards back to the office for testing. Prescriptions: WARFARIN SODIUM 4 MG TABS (WARFARIN SODIUM) 4mg  once daily except 6mg  on Thursdays or as directed by anticoagulation clinic  #45 x 2   Entered by:   Teressa Lower RN   Authorized by:   Kathlen Brunswick, MD, [facility]   Signed by:   Teressa Lower RN on 10/08/2010   Method used:   Faxed to .[facility] Environmental education officer)       [address] [address]       [place], [place]  [address]       Ph: [phone]       Fax: [phone]   RxID:   [id number]

## 2010-11-07 ENCOUNTER — Encounter: Payer: Self-pay | Admitting: *Deleted

## 2010-11-07 LAB — CONVERTED CEMR LAB
OCCULT 2: NEGATIVE
OCCULT 3: NEGATIVE

## 2010-11-11 ENCOUNTER — Encounter: Payer: Self-pay | Admitting: Internal Medicine

## 2010-11-11 ENCOUNTER — Encounter (INDEPENDENT_AMBULATORY_CARE_PROVIDER_SITE_OTHER): Payer: Medicare Other

## 2010-11-11 DIAGNOSIS — I428 Other cardiomyopathies: Secondary | ICD-10-CM

## 2010-11-13 ENCOUNTER — Encounter: Payer: Self-pay | Admitting: *Deleted

## 2010-11-13 LAB — CONVERTED CEMR LAB
BUN: 30 mg/dL — ABNORMAL HIGH (ref 6–23)
Basophils Absolute: 0 10*3/uL (ref 0.0–0.1)
Basophils Relative: 1 % (ref 0–1)
CO2: 25 meq/L (ref 19–32)
Calcium: 8.9 mg/dL (ref 8.4–10.5)
Creatinine, Ser: 1.7 mg/dL — ABNORMAL HIGH (ref 0.40–1.20)
Eosinophils Relative: 3 % (ref 0–5)
HCT: 34.6 % — ABNORMAL LOW (ref 36.0–46.0)
Hemoglobin: 11 g/dL — ABNORMAL LOW (ref 12.0–15.0)
MCHC: 31.8 g/dL (ref 30.0–36.0)
Monocytes Absolute: 0.7 10*3/uL (ref 0.1–1.0)
Monocytes Relative: 12 % (ref 3–12)
RDW: 14.1 % (ref 11.5–15.5)

## 2010-11-19 NOTE — Procedures (Signed)
Summary: 3 mth f/u per checkout on 08/20/10/tg/nw   Current Medications (verified): 1)  Multi-Vitamin/iron  Tabs (Multiple Vitamins-Iron) .Marland Kitchen.. 1 Tab Once Daily 2)  Hydralazine Hcl 100 Mg Tabs (Hydralazine Hcl) .... Take 1 Tablet Three Times A Day 3)  Carvedilol 25 Mg Tabs (Carvedilol) .... Take 1 Tablet Two Times A Day 4)  Imdur 120 Mg Xr24h-Tab (Isosorbide Mononitrate) .Marland Kitchen.. 1 Tab Once Daily 5)  Digoxin 0.125 Mg Tabs (Digoxin) .... Take 1 Tablet Once Daily 6)  Furosemide 80 Mg Tabs (Furosemide) .... Take One Tablet By Mouth Daily. 7)  Klor-Con M20 20 Meq Cr-Tabs (Potassium Chloride Crys Cr) .... Take 2 Tablets in Am 8)  Uloric 80 Mg Tabs (Febuxostat) .... One By Mouth Daily 9)  Warfarin Sodium 4 Mg Tabs (Warfarin Sodium) .... 4mg  Once Daily Except 6mg  On Thursdays or As Directed By Anticoagulation Clinic  Allergies (verified): 1)  ! * Tb   ICD Specifications Following MD:  Lewayne Bunting, MD     ICD Vendor:  [employer]     ICD Model Number:  [id number]     ICD Serial Number:  [id number] ICD DOI:  01/03/2008      Lead 1:    Location: RA     DOI: 01/03/2008     Model #: [id number]     Serial #: [id number]     Status: active Lead 2:    Location: RV     DOI: 01/03/2008     Model #: 1308     Serial #: [id number]     Status: active Lead 3:    Location: LV     DOI: 01/03/2008     Model #: [id number]     Serial #: [id number]     Status: active  Indications::  NICM   ICD Follow Up ICD Dependent:  Yes      Episodes Coumadin:  No  Brady Parameters Mode DDD     Lower Rate Limit:  50     Upper Rate Limit 130 PAV 160     Sensed AV Delay:  140  Tachy Zones VF:  214     VT1:  182     Tech Comments:  See Pace Art

## 2010-11-19 NOTE — Letter (Signed)
Summary: [place] [employer], agricultural at [facility]  [address] [place] [address]   Phone: [phone]  Fax: [phone]      November 13, 2010 MRN: [id number]   [name] [address] [place], [place]  [address]   Dear Ms. [facility],  Your test ordered by Selena Batten has been reviewed by your physician (or physician assistant) and was found to be  stable. Your physician (or physician assistant) felt no changes were needed at this time.  ____ Echocardiogram  ____ Cardiac Stress Test  __x__ Lab Work  ____ Peripheral vascular study of arms, legs or neck  ____ CT scan or X-ray  ____ Lung or Breathing test  _x___ Other: stool cards  No change in medical treatment at this time, per Dr. Dietrich Pates.  No blood was detected in stool.  Thank you, Adriell Polansky [name] RN    Ramona Bing, MD, [name], MD, F.A.C.C Lewayne Bunting, MD, F.A.C.C Nona Dell, MD, F.A.C.C Charlton Haws, MD, [name]

## 2010-11-19 NOTE — Cardiovascular Report (Signed)
Summary: Office Visit   Office Visit   Imported By: Roderic Ovens 11/14/2010 16:23:12  _____________________________________________________________________  External Attachment:    Type:   Image     Comment:   External Document

## 2010-11-19 NOTE — Miscellaneous (Signed)
Summary: hemoccult cards 11/07/2010  Clinical Lists Changes  Observations: Added new observation of HEMOCCULT 3: neg (11/07/2010 10:12) Added new observation of HEMOCCULT 2: neg (11/07/2010 10:12) Added new observation of HEMOCCULT 1: neg (11/07/2010 10:12)   results letter mailed to pt

## 2010-11-28 ENCOUNTER — Other Ambulatory Visit: Payer: Self-pay

## 2010-11-28 MED ORDER — DIGOXIN 125 MCG PO TABS: 125.0000 ug | ORAL_TABLET | Freq: Every day | ORAL | Status: DC

## 2010-11-28 MED ORDER — ISOSORBIDE MONONITRATE ER 60 MG PO TB24: 120.0000 mg | ORAL_TABLET | Freq: Every day | ORAL | Status: DC

## 2010-11-28 MED ORDER — CARVEDILOL 25 MG PO TABS: 25.0000 mg | ORAL_TABLET | Freq: Two times a day (BID) | ORAL | Status: DC

## 2010-11-28 MED ORDER — HYDRALAZINE HCL 100 MG PO TABS: 100.0000 mg | ORAL_TABLET | Freq: Three times a day (TID) | ORAL | Status: DC

## 2010-11-28 MED ORDER — POTASSIUM CHLORIDE CRYS ER 20 MEQ PO TBCR: 40.0000 meq | EXTENDED_RELEASE_TABLET | Freq: Every day | ORAL | Status: DC

## 2010-12-09 LAB — CBC
HCT: 26.4 % — ABNORMAL LOW (ref 36.0–46.0)
HCT: 28.6 % — ABNORMAL LOW (ref 36.0–46.0)
HCT: 27.8 % — ABNORMAL LOW (ref 36.0–46.0)
HCT: 28.2 % — ABNORMAL LOW (ref 36.0–46.0)
HCT: 30.9 % — ABNORMAL LOW (ref 36.0–46.0)
Hemoglobin: 9.8 g/dL — ABNORMAL LOW (ref 12.0–15.0)
Hemoglobin: 10.8 g/dL — ABNORMAL LOW (ref 12.0–15.0)
Hemoglobin: 10.8 g/dL — ABNORMAL LOW (ref 12.0–15.0)
Hemoglobin: 9.8 g/dL — ABNORMAL LOW (ref 12.0–15.0)
MCHC: 33.5 g/dL (ref 30.0–36.0)
MCHC: 33.5 g/dL (ref 30.0–36.0)
MCHC: 34.2 g/dL (ref 30.0–36.0)
MCHC: 34.7 g/dL (ref 30.0–36.0)
MCHC: 34.8 g/dL (ref 30.0–36.0)
MCHC: 34.9 g/dL (ref 30.0–36.0)
MCV: 91.2 fL (ref 78.0–100.0)
MCV: 91.2 fL (ref 78.0–100.0)
MCV: 91.6 fL (ref 78.0–100.0)
MCV: 89.8 fL (ref 78.0–100.0)
MCV: 90 fL (ref 78.0–100.0)
Platelets: 308 10*3/uL (ref 150–400)
Platelets: 371 10*3/uL (ref 150–400)
Platelets: 236 10*3/uL (ref 150–400)
Platelets: 242 10*3/uL (ref 150–400)
Platelets: 248 10*3/uL (ref 150–400)
RBC: 2.8 MIL/uL — ABNORMAL LOW (ref 3.87–5.11)
RBC: 3.14 MIL/uL — ABNORMAL LOW (ref 3.87–5.11)
RBC: 3.11 MIL/uL — ABNORMAL LOW (ref 3.87–5.11)
RBC: 3.23 MIL/uL — ABNORMAL LOW (ref 3.87–5.11)
RBC: 3.45 MIL/uL — ABNORMAL LOW (ref 3.87–5.11)
RBC: 3.47 MIL/uL — ABNORMAL LOW (ref 3.87–5.11)
RDW: 13.8 % (ref 11.5–15.5)
RDW: 14.1 % (ref 11.5–15.5)
RDW: 14.2 % (ref 11.5–15.5)
RDW: 14.2 % (ref 11.5–15.5)
WBC: 8.3 10*3/uL (ref 4.0–10.5)
WBC: 8.4 10*3/uL (ref 4.0–10.5)
WBC: 9 10*3/uL (ref 4.0–10.5)

## 2010-12-09 LAB — BASIC METABOLIC PANEL
BUN: 22 mg/dL (ref 6–23)
BUN: 28 mg/dL — ABNORMAL HIGH (ref 6–23)
BUN: 37 mg/dL — ABNORMAL HIGH (ref 6–23)
BUN: 71 mg/dL — ABNORMAL HIGH (ref 6–23)
CO2: 23 mEq/L (ref 19–32)
CO2: 23 mEq/L (ref 19–32)
CO2: 24 mEq/L (ref 19–32)
CO2: 24 mEq/L (ref 19–32)
CO2: 23 mEq/L (ref 19–32)
CO2: 23 mEq/L (ref 19–32)
CO2: 25 mEq/L (ref 19–32)
CO2: 27 mEq/L (ref 19–32)
Calcium: 8.8 mg/dL (ref 8.4–10.5)
Calcium: 8.9 mg/dL (ref 8.4–10.5)
Calcium: 8.5 mg/dL (ref 8.4–10.5)
Calcium: 8.8 mg/dL (ref 8.4–10.5)
Calcium: 8.8 mg/dL (ref 8.4–10.5)
Chloride: 105 mEq/L (ref 96–112)
Chloride: 107 mEq/L (ref 96–112)
Chloride: 107 mEq/L (ref 96–112)
Chloride: 109 mEq/L (ref 96–112)
Chloride: 109 mEq/L (ref 96–112)
Chloride: 101 mEq/L (ref 96–112)
Chloride: 102 mEq/L (ref 96–112)
Chloride: 104 mEq/L (ref 96–112)
Chloride: 105 mEq/L (ref 96–112)
Creatinine, Ser: 1.54 mg/dL — ABNORMAL HIGH (ref 0.4–1.2)
Creatinine, Ser: 1.76 mg/dL — ABNORMAL HIGH (ref 0.4–1.2)
Creatinine, Ser: 1.9 mg/dL — ABNORMAL HIGH (ref 0.4–1.2)
Creatinine, Ser: 2.68 mg/dL — ABNORMAL HIGH (ref 0.4–1.2)
Creatinine, Ser: 4.36 mg/dL — ABNORMAL HIGH (ref 0.4–1.2)
Creatinine, Ser: 2.06 mg/dL — ABNORMAL HIGH (ref 0.4–1.2)
Creatinine, Ser: 2.12 mg/dL — ABNORMAL HIGH (ref 0.4–1.2)
Creatinine, Ser: 2.56 mg/dL — ABNORMAL HIGH (ref 0.4–1.2)
GFR calc Af Amer: 12 mL/min — ABNORMAL LOW (ref >60)
GFR calc Af Amer: 31 mL/min — ABNORMAL LOW (ref >60)
GFR calc Af Amer: 40 mL/min — ABNORMAL LOW (ref >60)
GFR calc Af Amer: 18 mL/min — ABNORMAL LOW (ref >60)
GFR calc Af Amer: 22 mL/min — ABNORMAL LOW (ref >60)
GFR calc Af Amer: 28 mL/min — ABNORMAL LOW (ref >60)
GFR calc Af Amer: 29 mL/min — ABNORMAL LOW (ref >60)
GFR calc Af Amer: 29 mL/min — ABNORMAL LOW (ref >60)
GFR calc Af Amer: 34 mL/min — ABNORMAL LOW (ref >60)
GFR calc non Af Amer: 10 mL/min — ABNORMAL LOW (ref >60)
GFR calc non Af Amer: 17 mL/min — ABNORMAL LOW (ref >60)
GFR calc non Af Amer: 36 mL/min — ABNORMAL LOW (ref >60)
GFR calc non Af Amer: 24 mL/min — ABNORMAL LOW (ref >60)
GFR calc non Af Amer: 28 mL/min — ABNORMAL LOW (ref >60)
Glucose, Bld: 106 mg/dL — ABNORMAL HIGH (ref 70–99)
Glucose, Bld: 119 mg/dL — ABNORMAL HIGH (ref 70–99)
Glucose, Bld: 95 mg/dL (ref 70–99)
Glucose, Bld: 99 mg/dL (ref 70–99)
Potassium: 4 mEq/L (ref 3.5–5.1)
Potassium: 4 mEq/L (ref 3.5–5.1)
Potassium: 4.2 mEq/L (ref 3.5–5.1)
Potassium: 3.5 mEq/L (ref 3.5–5.1)
Potassium: 3.7 mEq/L (ref 3.5–5.1)
Potassium: 3.9 mEq/L (ref 3.5–5.1)
Sodium: 136 mEq/L (ref 135–145)
Sodium: 135 mEq/L (ref 135–145)
Sodium: 137 mEq/L (ref 135–145)
Sodium: 137 mEq/L (ref 135–145)
Sodium: 137 mEq/L (ref 135–145)

## 2010-12-09 LAB — COMPREHENSIVE METABOLIC PANEL
Albumin: 3.3 g/dL — ABNORMAL LOW (ref 3.5–5.2)
BUN: 91 mg/dL — ABNORMAL HIGH (ref 6–23)
BUN: 83 mg/dL — ABNORMAL HIGH (ref 6–23)
Calcium: 9.1 mg/dL (ref 8.4–10.5)
Calcium: 8.9 mg/dL (ref 8.4–10.5)
Chloride: 104 mEq/L (ref 96–112)
Creatinine, Ser: 3.55 mg/dL — ABNORMAL HIGH (ref 0.4–1.2)
GFR calc Af Amer: 15 mL/min — ABNORMAL LOW (ref >60)
GFR calc non Af Amer: 13 mL/min — ABNORMAL LOW (ref >60)
Glucose, Bld: 159 mg/dL — ABNORMAL HIGH (ref 70–99)
Potassium: 4.4 mEq/L (ref 3.5–5.1)
Potassium: 5.3 mEq/L — ABNORMAL HIGH (ref 3.5–5.1)
Sodium: 134 mEq/L — ABNORMAL LOW (ref 135–145)
Sodium: 134 mEq/L — ABNORMAL LOW (ref 135–145)
Total Protein: 7.8 g/dL (ref 6.0–8.3)
Total Protein: 6.7 g/dL (ref 6.0–8.3)

## 2010-12-09 LAB — DIFFERENTIAL
Basophils Relative: 0 % (ref 0–1)
Basophils Relative: 0 % (ref 0–1)
Basophils Relative: 1 % (ref 0–1)
Basophils Relative: 1 % (ref 0–1)
Eosinophils Absolute: 0 10*3/uL (ref 0.0–0.7)
Eosinophils Absolute: 0.2 10*3/uL (ref 0.0–0.7)
Eosinophils Absolute: 0.2 10*3/uL (ref 0.0–0.7)
Eosinophils Relative: 2 % (ref 0–5)
Eosinophils Relative: 2 % (ref 0–5)
Lymphocytes Relative: 22 % (ref 12–46)
Lymphocytes Relative: 24 % (ref 12–46)
Lymphs Abs: 1.9 10*3/uL (ref 0.7–4.0)
Lymphs Abs: 2.2 10*3/uL (ref 0.7–4.0)
Monocytes Absolute: 1.3 10*3/uL — ABNORMAL HIGH (ref 0.1–1.0)
Monocytes Absolute: 0.6 10*3/uL (ref 0.1–1.0)
Monocytes Absolute: 0.8 10*3/uL (ref 0.1–1.0)
Monocytes Absolute: 1 10*3/uL (ref 0.1–1.0)
Monocytes Relative: 12 % (ref 3–12)
Monocytes Relative: 12 % (ref 3–12)
Monocytes Relative: 6 % (ref 3–12)
Monocytes Relative: 8 % (ref 3–12)
Monocytes Relative: 9 % (ref 3–12)
Neutro Abs: 5.2 10*3/uL (ref 1.7–7.7)
Neutro Abs: 5.6 10*3/uL (ref 1.7–7.7)
Neutrophils Relative %: 59 % (ref 43–77)
Neutrophils Relative %: 63 % (ref 43–77)
Neutrophils Relative %: 66 % (ref 43–77)

## 2010-12-09 LAB — URINALYSIS, ROUTINE W REFLEX MICROSCOPIC
Glucose, UA: NEGATIVE mg/dL
Hgb urine dipstick: NEGATIVE
Ketones, ur: NEGATIVE mg/dL
Leukocytes, UA: NEGATIVE
Protein, ur: NEGATIVE mg/dL

## 2010-12-09 LAB — MAGNESIUM: Magnesium: 2.3 mg/dL (ref 1.5–2.5)

## 2010-12-09 LAB — DIGOXIN LEVEL: Digoxin Level: 2 ng/mL (ref 0.8–2.0)

## 2010-12-09 LAB — TSH: TSH: 2.126 u[IU]/mL (ref 0.350–4.500)

## 2010-12-09 LAB — POCT I-STAT 3, VENOUS BLOOD GAS (G3P V)
Acid-base deficit: 3 mmol/L — ABNORMAL HIGH (ref 0.0–2.0)
Bicarbonate: 21.4 mEq/L (ref 20.0–24.0)
TCO2: 22 mmol/L (ref 0–100)
pH, Ven: 7.39 — ABNORMAL HIGH (ref 7.250–7.300)

## 2010-12-09 LAB — PROTIME-INR
INR: 2 — ABNORMAL HIGH (ref 0.00–1.49)
INR: 2.4 — ABNORMAL HIGH (ref 0.00–1.49)
INR: 2.5 — ABNORMAL HIGH (ref 0.00–1.49)
INR: 2.3 — ABNORMAL HIGH (ref 0.00–1.49)
INR: 2.6 — ABNORMAL HIGH (ref 0.00–1.49)
INR: 3 — ABNORMAL HIGH (ref 0.00–1.49)
INR: 3.1 — ABNORMAL HIGH (ref 0.00–1.49)
Prothrombin Time: 20.8 seconds — ABNORMAL HIGH (ref 11.6–15.2)
Prothrombin Time: 26.6 seconds — ABNORMAL HIGH (ref 11.6–15.2)
Prothrombin Time: 27.7 seconds — ABNORMAL HIGH (ref 11.6–15.2)
Prothrombin Time: 26.7 seconds — ABNORMAL HIGH (ref 11.6–15.2)
Prothrombin Time: 28 seconds — ABNORMAL HIGH (ref 11.6–15.2)
Prothrombin Time: 33.9 seconds — ABNORMAL HIGH (ref 11.6–15.2)

## 2010-12-09 LAB — BRAIN NATRIURETIC PEPTIDE: Pro B Natriuretic peptide (BNP): 32 pg/mL (ref 0.0–100.0)

## 2010-12-09 LAB — URINE CULTURE: Colony Count: >=100000

## 2010-12-09 LAB — SODIUM, URINE, RANDOM: Sodium, Ur: <10 mEq/L

## 2010-12-09 LAB — IRON AND TIBC: Iron: 36 ug/dL — ABNORMAL LOW (ref 42–135)

## 2010-12-09 LAB — FERRITIN: Ferritin: 288 ng/mL (ref 10–291)

## 2010-12-09 LAB — URIC ACID: Uric Acid, Serum: 10.4 mg/dL — ABNORMAL HIGH (ref 2.4–7.0)

## 2010-12-09 LAB — APTT: aPTT: 81 seconds — ABNORMAL HIGH (ref 24–37)

## 2010-12-12 LAB — BASIC METABOLIC PANEL
BUN: 49 mg/dL — ABNORMAL HIGH (ref 6–23)
BUN: 34 mg/dL — ABNORMAL HIGH (ref 6–23)
BUN: 39 mg/dL — ABNORMAL HIGH (ref 6–23)
BUN: 49 mg/dL — ABNORMAL HIGH (ref 6–23)
BUN: 54 mg/dL — ABNORMAL HIGH (ref 6–23)
CO2: 23 mEq/L (ref 19–32)
CO2: 24 mEq/L (ref 19–32)
CO2: 24 mEq/L (ref 19–32)
CO2: 25 mEq/L (ref 19–32)
CO2: 25 mEq/L (ref 19–32)
CO2: 21 mEq/L (ref 19–32)
Calcium: 8.7 mg/dL (ref 8.4–10.5)
Calcium: 8.9 mg/dL (ref 8.4–10.5)
Chloride: 100 mEq/L (ref 96–112)
Chloride: 101 mEq/L (ref 96–112)
Chloride: 102 mEq/L (ref 96–112)
Chloride: 107 mEq/L (ref 96–112)
Chloride: 99 mEq/L (ref 96–112)
Chloride: 107 mEq/L (ref 96–112)
Creatinine, Ser: 1.55 mg/dL — ABNORMAL HIGH (ref 0.4–1.2)
Creatinine, Ser: 1.95 mg/dL — ABNORMAL HIGH (ref 0.4–1.2)
Creatinine, Ser: 1.96 mg/dL — ABNORMAL HIGH (ref 0.4–1.2)
GFR calc Af Amer: 46 mL/min — ABNORMAL LOW (ref >60)
GFR calc Af Amer: 32 mL/min — ABNORMAL LOW (ref >60)
GFR calc Af Amer: 40 mL/min — ABNORMAL LOW (ref >60)
GFR calc Af Amer: 54 mL/min — ABNORMAL LOW (ref >60)
GFR calc non Af Amer: 34 mL/min — ABNORMAL LOW (ref >60)
GFR calc non Af Amer: 38 mL/min — ABNORMAL LOW (ref >60)
GFR calc non Af Amer: 27 mL/min — ABNORMAL LOW (ref >60)
GFR calc non Af Amer: 32 mL/min — ABNORMAL LOW (ref >60)
Glucose, Bld: 112 mg/dL — ABNORMAL HIGH (ref 70–99)
Glucose, Bld: 125 mg/dL — ABNORMAL HIGH (ref 70–99)
Glucose, Bld: 149 mg/dL — ABNORMAL HIGH (ref 70–99)
Potassium: 4.5 mEq/L (ref 3.5–5.1)
Potassium: 5.2 mEq/L — ABNORMAL HIGH (ref 3.5–5.1)
Potassium: 4.1 mEq/L (ref 3.5–5.1)
Potassium: 4.4 mEq/L (ref 3.5–5.1)
Potassium: 4.5 mEq/L (ref 3.5–5.1)
Potassium: 4.7 mEq/L (ref 3.5–5.1)
Potassium: 5 mEq/L (ref 3.5–5.1)
Potassium: 3.8 mEq/L (ref 3.5–5.1)
Sodium: 135 mEq/L (ref 135–145)
Sodium: 136 mEq/L (ref 135–145)
Sodium: 133 mEq/L — ABNORMAL LOW (ref 135–145)
Sodium: 136 mEq/L (ref 135–145)
Sodium: 136 mEq/L (ref 135–145)
Sodium: 138 mEq/L (ref 135–145)

## 2010-12-12 LAB — CBC
HCT: 33 % — ABNORMAL LOW (ref 36.0–46.0)
HCT: 30.7 % — ABNORMAL LOW (ref 36.0–46.0)
HCT: 32.3 % — ABNORMAL LOW (ref 36.0–46.0)
HCT: 33.2 % — ABNORMAL LOW (ref 36.0–46.0)
HCT: 34.6 % — ABNORMAL LOW (ref 36.0–46.0)
Hemoglobin: 10.4 g/dL — ABNORMAL LOW (ref 12.0–15.0)
Hemoglobin: 10.9 g/dL — ABNORMAL LOW (ref 12.0–15.0)
Hemoglobin: 11.2 g/dL — ABNORMAL LOW (ref 12.0–15.0)
MCHC: 33.7 g/dL (ref 30.0–36.0)
MCHC: 33.8 g/dL (ref 30.0–36.0)
MCHC: 33.8 g/dL (ref 30.0–36.0)
MCV: 90.3 fL (ref 78.0–100.0)
MCV: 91.3 fL (ref 78.0–100.0)
MCV: 91.4 fL (ref 78.0–100.0)
Platelets: 251 10*3/uL (ref 150–400)
Platelets: 230 10*3/uL (ref 150–400)
Platelets: 235 10*3/uL (ref 150–400)
Platelets: 251 10*3/uL (ref 150–400)
RBC: 3.29 MIL/uL — ABNORMAL LOW (ref 3.87–5.11)
RBC: 3.4 MIL/uL — ABNORMAL LOW (ref 3.87–5.11)
RBC: 3.54 MIL/uL — ABNORMAL LOW (ref 3.87–5.11)
RDW: 13.2 % (ref 11.5–15.5)
WBC: 10.9 10*3/uL — ABNORMAL HIGH (ref 4.0–10.5)
WBC: 6 10*3/uL (ref 4.0–10.5)
WBC: 7.2 10*3/uL (ref 4.0–10.5)
WBC: 7.7 10*3/uL (ref 4.0–10.5)
WBC: 9 10*3/uL (ref 4.0–10.5)

## 2010-12-12 LAB — DIFFERENTIAL
Eosinophils Relative: 0 % (ref 0–5)
Lymphocytes Relative: 13 % (ref 12–46)
Lymphs Abs: 1.4 10*3/uL (ref 0.7–4.0)

## 2010-12-12 LAB — APTT: aPTT: 88 seconds — ABNORMAL HIGH (ref 24–37)

## 2010-12-12 LAB — CK TOTAL AND CKMB (NOT AT ARMC)
CK, MB: 3.1 ng/mL (ref 0.3–4.0)
CK, MB: 3.7 ng/mL (ref 0.3–4.0)
Total CK: 169 U/L (ref 7–177)
Total CK: 212 U/L — ABNORMAL HIGH (ref 7–177)
Total CK: 215 U/L — ABNORMAL HIGH (ref 7–177)

## 2010-12-12 LAB — MAGNESIUM
Magnesium: 1.9 mg/dL (ref 1.5–2.5)
Magnesium: 2 mg/dL (ref 1.5–2.5)

## 2010-12-12 LAB — COMPREHENSIVE METABOLIC PANEL
ALT: <8 U/L (ref 0–35)
AST: 20 U/L (ref 0–37)
Calcium: 8.7 mg/dL (ref 8.4–10.5)
Creatinine, Ser: 1.18 mg/dL (ref 0.4–1.2)
GFR calc Af Amer: 54 mL/min — ABNORMAL LOW (ref >60)
Sodium: 140 mEq/L (ref 135–145)
Total Protein: 6.1 g/dL (ref 6.0–8.3)

## 2010-12-12 LAB — TROPONIN I
Troponin I: 0.09 ng/mL — ABNORMAL HIGH (ref 0.00–0.06)
Troponin I: 0.26 ng/mL — ABNORMAL HIGH (ref 0.00–0.06)

## 2010-12-12 LAB — PROTIME-INR
INR: 2.6 — ABNORMAL HIGH (ref 0.00–1.49)
INR: 2.9 — ABNORMAL HIGH (ref 0.00–1.49)
INR: 1.8 — ABNORMAL HIGH (ref 0.00–1.49)
INR: 2.9 — ABNORMAL HIGH (ref 0.00–1.49)
Prothrombin Time: 29.5 seconds — ABNORMAL HIGH (ref 11.6–15.2)
Prothrombin Time: 32.9 seconds — ABNORMAL HIGH (ref 11.6–15.2)
Prothrombin Time: 21.6 seconds — ABNORMAL HIGH (ref 11.6–15.2)

## 2010-12-12 LAB — TSH: TSH: 2.701 u[IU]/mL (ref 0.350–4.500)

## 2010-12-12 LAB — DIGOXIN LEVEL: Digoxin Level: 0.3 ng/mL — ABNORMAL LOW (ref 0.8–2.0)

## 2011-01-09 ENCOUNTER — Other Ambulatory Visit: Payer: Self-pay | Admitting: Cardiology

## 2011-01-09 LAB — BASIC METABOLIC PANEL
CO2: 25 mEq/L (ref 19–32)
Calcium: 8.5 mg/dL (ref 8.4–10.5)
Creat: 1.72 mg/dL — ABNORMAL HIGH (ref 0.40–1.20)
Sodium: 142 mEq/L (ref 135–145)

## 2011-01-10 ENCOUNTER — Telehealth: Payer: Self-pay | Admitting: *Deleted

## 2011-01-10 ENCOUNTER — Encounter: Payer: Self-pay | Admitting: *Deleted

## 2011-01-10 DIAGNOSIS — I509 Heart failure, unspecified: Secondary | ICD-10-CM

## 2011-01-10 MED ORDER — POTASSIUM CHLORIDE CRYS ER 20 MEQ PO TBCR: 20.0000 meq | EXTENDED_RELEASE_TABLET | ORAL | Status: DC

## 2011-01-10 NOTE — Telephone Encounter (Signed)
I spoke with pt's spouse to increase potassium dose, sent new rx to drug store and ordered labwork

## 2011-01-10 NOTE — Telephone Encounter (Signed)
Message copied by Teressa Lower on Fri Jan 10, 2011  4:24 PM ------      Message from: Keya Paha Bing      Created: Thu Jan 09, 2011 10:09 PM       Verify that patient is taking potassium chloride 40 mEq q.d.      If so, increased to 40 mEq q.a.m. And 20 mEq q.p.m.      BMet in one month

## 2011-01-14 NOTE — Assessment & Plan Note (Signed)
[facility]                       [place] CARDIOLOGY OFFICE NOTE   LINCOLN, KLEINER                  MRN:          [id number]  DATE:11/23/2008                            DOB:          Sep 12, 1932    CARDIOLOGIST:  Gerrit Friends. Dietrich Pates, MD, [facility]   PRIMARY CARE PHYSICIAN:  Angus G. Renard Matter, MD   REASON FOR VISIT:  Post-hospitalization followup.   HISTORY OF PRESENT ILLNESS:  Ms. Fleeger is a 77 year old female with a  history of nonischemic cardiomyopathy with an EF of 20-25% status post  CRT-D implantation who was recently admitted at [facility] for  inappropriate ICD discharges in the setting of atrial  tachycardia/supraventricular tachycardia.  She underwent EP study that  failed to induce SVT.  She underwent successful AV nodal ablation.  She  was noted to develop acute-on-chronic renal insufficiency, and her  spironolactone, Lasix, potassium, and Altace were all held.  She was set  up for discharge to home, November 11, 2008.  However, the patient  developed significant left ankle pain and was unable to ambulate.  She  was kept in the hospital for several more days to address this.  She had  a uric acid level that was 12.7.  It was felt that the patient likely  had a monoarticular arthritic flare instead of gout.  In any event, she  was treated with prednisone taper.  She was eventually placed back on  her Lasix and potassium and low-dose Altace.  She was eventually  discharged back to home and is working with Home Health physical  therapy.  Recent lab work performed yesterday demonstrates a stable  hemoglobin at 11.4, potassium 4.1, BUN 42, creatinine 1.55.  In the  office, she notes that she is overall doing well.  She has had some  shortness of breath.  This is with exertion but overall stable without  significant change.  She denies orthopnea or PND.  She denies any  significant pedal edema.  She denies any syncope or further  ICD  therapies.  She is describing some chest discomfort from time to time.  She describes this as a heaviness.  It lasts for just seconds and occurs  at any time.  She does not note any association with meals or dyspepsia.  She denies any exertional symptoms.  She denies any associated nausea,  diaphoresis, shortness of breath, arm or jaw symptoms.  She does not  take any nitroglycerin.  Symptoms usually resolve on their own.   CURRENT MEDICATIONS:  1. Ramipril 2.5 mg daily.  2. Digoxin 0.25 mg daily.  3. Furosemide 80 mg daily.  4. Carvedilol 25 mg b.i.d.  5. Potassium 10 mEq daily.  6. Coumadin as directed.  7. Fish oil daily.   ALLERGIES:  TB.   REVIEW OF SYSTEMS:  Her left ankle pain is improving, but still  problematic for her and she is walking with a cane.  She continues to  work with physical therapy.   PHYSICAL EXAMINATION:  GENERAL:  She is a well-nourished, well-developed  female in no acute distress.  VITAL SIGNS:  Blood pressure is 121/71, pulse 70,  weight 220 pounds.  HEENT:  Normal.  NECK:  Without JVD at 90 degrees.  CARDIAC:  Normal S1 and S2.  Regular rate and rhythm without S3 or  murmur.  LUNGS:  Clear to auscultation bilaterally without wheezing, rhonchi, or  rales.  ABDOMEN:  Soft, nontender.  EXTREMITIES:  With trace edema bilaterally.  NEUROLOGIC:  She is alert and oriented x3.  Cranial nerves II-XII  grossly intact.  SKIN:  Warm and dry.   ASSESSMENT AND PLAN:  1. Chronic systolic congestive heart failure in the setting of      nonischemic cardiomyopathy with an ejection fraction of 20-25%.      She had recent changes in her medicines due to acute-on-chronic      renal insufficiency.  She is back on most of her heart failure      medicines.  However, she is at a lower dose of her ramipril.  I      will increase that to 5 mg a day.  She will have a followup BMET      and BNP in 1 week.  2. Chest discomfort.  The patient had normal coronaries by  cardiac      catheterization in 2003.  Her symptoms are very atypical and she is      not having any exertional chest discomfort or shortness of breath.      I have initially recommended Pepcid 20 mg b.i.d. to cover for the      possibility of acid reflux contributing to her symptoms.  She will      be reassessed in followup.  If she has worsening symptoms or no      relief with the above therapy, we may need to consider stress      testing.  3. Hyperglycemia.  The patient's recent lab work demonstrated a      glucose level of 198.  This is almost diagnostic for diabetes      mellitus.  With her followup blood work in a week, we will obtain a      hemoglobin A1c.  4. Chronic kidney disease.  As noted above, we will recheck a BMET in      a week with the above medication changes.  5. Chronic Coumadin therapy secondary to history of cerebrovascular      accident and left upper extremity deep vein thrombosis.  This is      followed by Dr. Renard Matter.  6. Hypertension.  This is overall well controlled.   DISPOSITION:  The patient will follow up with Dr. Ladona Ridgel as indicated.  She will be brought back in followup with Dr. Dietrich Pates in the next 6  weeks.  She knows to return sooner p.r.n.      Tereso Newcomer, PA-C  Electronically Signed      Gerrit Friends. Dietrich Pates, MD, [facility]  Electronically Signed   SW/MedQ  DD: 11/23/2008  DT: 11/24/2008  Job #: [id number]   cc:   Angus G. Renard Matter, MD

## 2011-01-14 NOTE — Discharge Summary (Signed)
NAMELERLINE, VALDIVIA NO.:  [id number]   MEDICAL RECORD NO.:  [id number]          PATIENT TYPE:  INP   LOCATION:  2029                         FACILITY:  [facility]   PHYSICIAN:  Rollene Rotunda, MD, FACCDATE OF BIRTH:  [birth year]   DATE OF ADMISSION:  02/14/2009  DATE OF DISCHARGE:  02/22/2009                               DISCHARGE SUMMARY   ADDENDUM   This is an addendum to the previously dictated discharge summary with an  amended med list to include the med changes from her home medications.   1. Digoxin 0.25 mg is discontinued.  2. Hydralazine 100 mg is discontinued.  3. Lasix 80 mg is discontinued.  4. Prednisone 10 mg is discontinued.  5. Imdur 120 mg is discontinued.  6. Famotidine 20 mg b.i.d. is to be used instead of Protonix.  7. Darvocet 1-2 tabs q.4-6 hours p.r.n.      Theodore Demark, PA-C      Rollene Rotunda, MD, [facility]  Electronically Signed    RB/MEDQ  D:  02/22/2009  T:  02/22/2009  Job:  [id number]   cc:   Angus G. Renard Matter, MD

## 2011-01-14 NOTE — Group Therapy Note (Signed)
[name], [name]           ACCOUNT NO.:  [id number]   MEDICAL RECORD NO.:  [id number]          PATIENT TYPE:  INP   LOCATION:  IC04                          FACILITY:  [facility]   PHYSICIAN:  Angus G. Renard Matter, MD   DATE OF BIRTH:  Apr 10, 1933   DATE OF PROCEDURE:  02/03/2009  DATE OF DISCHARGE:                                 PROGRESS NOTE   This patient appears to be fairly comfortable.  She remains on  dobutamine drip.  She does have a history of nonischemic cardiomyopathy,  hypertension, prior CVA, hyperlipidemia, hyperuricemia, congestive heart  failure, ejection fraction of 25%,  chronic kidney disease and non-  insulin dependent diabetes.   PHYSICAL EXAMINATION:  VITAL SIGNS:  Blood pressure 104/67, pulse rate  76, temperature 98.  O2 sat 99%.  HEART:  Regular rhythm, no murmurs.  LUNGS:  Diminished breath sounds bilaterally.  No rales.  ABDOMEN:  No palpable organs or masses.  EXTREMITIES:  Free of edema.   ASSESSMENT:  The patient was found to have nonischemic cardiomyopathy  with low ejection fraction, prior history of CVA, congestive heart  failure, chronic renal failure, hyperkalemia, history of hypertension  Plan to continue current regimen.   The patient's most current lab data:  Sodium 134, potassium 4.3, glucose  106, BUN 6, creatinine 2.56.  CBC: WBC 8300 with hemoglobin 10.0,  hematocrit 28.8.  Troponin as high as 3.1.      Angus G. Renard Matter, MD  Electronically Signed     AGM/MEDQ  D:  02/03/2009  T:  02/04/2009  Job:  [id number]

## 2011-01-14 NOTE — H&P (Signed)
[name], [name]           ACCOUNT NO.:  [id number]   MEDICAL RECORD NO.:  [id number]          PATIENT TYPE:  INP   LOCATION:  IC04                          FACILITY:  [facility]   PHYSICIAN:  Angus G. Renard Matter, MD   DATE OF BIRTH:  1933/02/28   DATE OF ADMISSION:  02/01/2009  DATE OF DISCHARGE:  LH                              HISTORY & PHYSICAL    A 77 year old Afro American female was admitted as a direct admission to  ICU because of progressive shortness of breath.  The patient was  admitted by Dr. Dietrich Pates for dobutamine drip.  The patient does have a  history of ischemic cardiomyopathy, hypertension, prior history of CVA,  hyperlipidemia, hyperuricemia, congestive heart failure with ejection  fraction of 25% chronic kidney disease, non-insulin dependent diabetes  and also has moderate-to-severe mitral regurgitation.  She recently had  ICD placement has chronic renal failure, hyperuricemia.   SOCIAL HISTORY:  The patient does not smoke or drink alcohol.   PAST MEDICAL AND SURGICAL HISTORY:  The patient had previous history of  hysterectomy AV node ablation, biventricular ICD placement, does have a  history hypertension, previous history of CVA, congestive heart failure  with 20-25% ejection fraction, diabetes mellitus type 2, chronic kidney  disease.   ALLERGIES:  The patient has no known allergies.   MEDICATION LIST:  Coreg 6.25 mg b.i.d., Pepcid 20 mg daily, Lasix 80 mg  IV daily, Darvocet on p.r.n. basis, ramipril 5 mg daily, potassium 20  mEq daily, Aldactone 25 mg b.i.d.   REVIEW OF SYSTEMS:  HEENT:  Negative.  CARDIOPULMONARY:  The patient has  occasional chest pain, dyspnea with exertion.  GI: No vomiting or  diarrhea.  GU: No dysuria, hematuria.   PHYSICAL EXAMINATION:  GENERAL:  Elderly patient complaining of  shortness of breath,  blood pressure 99/66, heart rate 104.  HEENT:  Eyes: PERRLA.  TMs negative.  Oropharynx benign  NECK:  Supple.  Bilateral jugular  venous distention.  CHEST:  Diminished breath sounds bilaterally.  No rales.  HEART:  Regular rhythm, no murmur.  ABDOMEN:  No palpable organs or masses.  No organomegaly.  EXTREMITIES:  1+ edema.   PERTINENT LAB DATA:  Creatinine 3.15, BUN 83, potassium 5.3, INR 3,  hemoglobin 10.8, hematocrit 30.9.  BNP 30.   ASSESSMENT:  The patient does have nonischemic cardiomyopathy with low  ejection fraction, prior history of CVA, congestive heart failure,  chronic renal failure, hyperkalemia, history of hypertension, type 2  diabetes.      Angus G. Renard Matter, MD  Electronically Signed     AGM/MEDQ  D:  02/02/2009  T:  02/03/2009  Job:  [id number]

## 2011-01-14 NOTE — Op Note (Signed)
NAMECIMBERLY, STOFFEL NO.:  [id number]   MEDICAL RECORD NO.:  [id number]          PATIENT TYPE:  INP   LOCATION:  2807                         FACILITY:  [facility]   PHYSICIAN:  Doylene Canning. Ladona Ridgel, MD    DATE OF BIRTH:  [birth year]   DATE OF PROCEDURE:  01/03/2008  DATE OF DISCHARGE:                               OPERATIVE REPORT   PROCEDURE PERFORMED:  Implantation of biventricular implantable  cardioverter-defibrillator.   INDICATIONS:  Nonischemic cardiomyopathy, EF 15%, class III heart  failure and a QRS duration 120 to 130 milliseconds with interventricular  conduction delay.   INTRODUCTION:  The patient is a 77 year old woman with a history of  progressive heart failure symptoms who is referred for biventricular ICD  implantation.   PROCEDURE:  After informed consent was obtained, the patient taken to  the diagnostic EP lab in the fasting state.  After the usual preparation  and draping, intravenous fentanyl and Midazolam was given for sedation.  30 mL lidocaine was infiltrated the left infraclavicular region.  A 7 cm  incision was carried out over this region.  Electrocautery utilized to  dissect down to the fascial plane.  The left subclavian vein was  subsequently punctured x3 and the [employer], model  [id number], 65-cm active fixation defibrillation lead serial number [id number]  [id number] was advanced to the right ventricle and the Medtronic model 5076, 52-  cm active fixation pacing lead serial number [id number] was advanced to  the right atrium.  Mapping was carried out in the right ventricle at the  final site on the RV septum.  R-waves measured 9 mV.  The pacing  impedance was 665 ohms and threshold 0.3 volts at 0.5 milliseconds.  There is a large injury current and 10 V pacing did not stimulate the  diaphragm.  With the defibrillator lead in satisfactory position on the  RV septum, attention was then turned to placement of the atrial lead  with  placement in the anterolateral portion of the right atrium where P-  waves were 3.  The pacing impedance was 800 ohms and threshold was 1.3  volts at 0.5 milliseconds.  There is also a nice injury current with  lead actively fixed.  10 volt pacing did not stimulate the diaphragm.  With the right atrial and right ventricular lead in satisfactory  position, attention was then turned to placement of the left ventricular  lead.  The coronary sinus guiding catheter was cannulated without  particular difficulty.  Venography of the coronary sinus was carried  out.  This demonstrated a very large posterior vein coursing all way out  to the posterior apex.  There was a smaller lateral vein with very  significant tortuosity extending down to the midportion of the lateral  wall of the left ventricle.  There was also high lateral vein that was  seen extending also on that lateral wall of the left ventricle.  Initially the mid lateral vein was utilized and cannulated and the LV  lead advanced into the vein.  However, lead position was not stable,  particularly with the unipolar pacing lead  and at this point, this was  abandoned and the posterior vein was cannulated.  The posterior vein  resulted in satisfactory pacing thresholds but intermittent  diaphragmatic stimulation.  Because of the concern that the patient  would ultimately continue to have problems with diaphragmatic  stimulation, this site was ultimately abandoned as well after several of  its branches were attempted to be utilized for LV pacing.  At this  point, the higher lateral vein was utilized and the initial unipolar  lead was placed onto this region.  However, at the site where the  unipolar lead was placed into the vein proper, there was again  diaphragmatic stimulation.  At this point the unipolar lead was  abandoned and a bipolar lead was utilized and advanced into the high  lateral vein and out an acceptable distance where the  pacing thresholds  were very satisfactory and diaphragmatic stimulation was not observed.  The impedance was 1100 ohms and threshold 0.9 volts at 0.5 milliseconds  and the LV waves measured 23 mV.  With all these parameters  demonstrated, the leads was secured to the subpectoralis fascia with a  figure-of-eight silk suture.  The sewing sleeves were also secured with  silk suture.  Electrocautery utilized to make a subcutaneous pocket.  Kanamycin irrigation was utilized to irrigate the pocket.  Electrocautery was utilized to assure hemostasis.  The Medtronic  Concerto biventricular ICD, serial number [id number] was connected to  the atrial, RV and LV leads and placed back in the subcutaneous pocket.  Generator was secured with silk suture.  At this point, defibrillation  threshold testing was carried out.   After the patient was more deeply sedated with fentanyl and Versed, VF  was induced with T-wave shock.  A 15 joules shock was delivered,  terminating ventricular fibrillation and restoring sinus rhythm.  At  this point no additional defibrillation threshold testing was carried  out and the incision closed with a layer of 2-0 Vicryl followed by layer  of 3-0 Vicryl.  Benzoin was painted on the skin and Steri-Strips were  applied and pressure dressing was placed and the patient returned to her  room in satisfactory condition.   COMPLICATIONS:  There were no immediate procedure complications.   RESULTS:  These demonstrate successful implantation of a Medtronic  biventricular ICD in a patient with a nonischemic cardiomyopathy and  congestive heart failure, EF 15%, class III heart failure, QRS duration  was 120 to 130 milliseconds (IVCD).      Doylene Canning. Ladona Ridgel, MD  Electronically Signed     GWT/MEDQ  D:  01/03/2008  T:  01/03/2008  Job:  [id number]   cc:   Gerrit Friends. Dietrich Pates, MD, [facility]  [address]  [place], [place] [address]   Angus G. Renard Matter, MD  Fax: [phone]

## 2011-01-14 NOTE — Letter (Signed)
November 05, 2007    Angus G. Renard Matter, MD  [address]  [place], [place] [address]   RE:  [name], [name]  MRN:  [id number]  /  DOB:  1933-01-26   Dear [name],   Ms. Heater returns to the office for continued assessment and  treatment of severe cardiomyopathy with a recent hospitalization for  congestive heart failure.  She has done better with adjustment of her  medical therapy, but still notes dyspnea on mild exertion.  She would be  considered to have class 3 congestive heart failure.  She has not noted  excessive edema.  She has been attempting to follow a low-salt diet.  She has not monitored weight at home.  She has had progressive  impairment in renal function from a creatinine of 1.4 last year to 2.1  last month.   CURRENT MEDICATIONS INCLUDE:  1. Aldactone 25 mg daily.  2. Ramipril 10 mg daily.  3. KCL 20 mEq b.i.d.  4. Carvedilol 25 mg b.i.d.  5. Digoxin 0.25 mg daily.  6. Furosemide 40 mg b.i.d.   EXAM:  Pleasant, overweight woman, in no acute distress.  The weight is 217, 7 pounds more than in February.  Blood pressure  100/60, heart rate 64 and regular, respirations 18.  NECK:  No jugular venous distention.  LUNGS:  Clear.  CARDIAC:  Normal first and second heart sounds.  Fourth heart sound  present.  Modest basilar systolic murmur.  ABDOMEN:  Soft and nontender.  No organomegaly.  EXTREMITIES:  One-half-plus ankle edema.   IMPRESSION:  [name] again appears compensated.  I am somewhat  distressed that her creatinine has increased.  But she has gained 7  pounds.  I hope this is body mass, rather than fluid retention.  We will  check a BNP level, a repeat chemistry profile and a chest x-ray.  If  results are good, this is probably the best we can do with her  symptomatically.  She has a borderline QRS complex for consideration of  biventricular pacemaker.  She certainly can be considered for a  defibrillator.  We will refer her to Dr. Ladona Ridgel for  that assessment.  I  will see this nice woman again in two months.    Sincerely,      Gerrit Friends. Dietrich Pates, MD, [facility]  Electronically Signed    RMR/MedQ  DD: 11/05/2007  DT: 11/05/2007  Job #: [id number]

## 2011-01-14 NOTE — Letter (Signed)
July 21, 2008    Angus G. Renard Matter, MD  [address]  [place], [place] [address]   RE:  [name], [name]  MRN:  [id number]  /  DOB:  [birth year]   Dear [name],   [name] was seen in referral from our [facility], where her  pacemaker suggested volume overload.  The patient reports that she  obtains her medications from a mailing service.  At the last renewal,  she apparently did not receive a renewal prescription for furosemide 80  mg daily and has not been taking this for some time.  Nonetheless, she  notes no increase in exertional dyspnea , no orthopnea, no PND, and no  pedal edema.  She has not been following her weight at home.   CURRENT MEDICATIONS:  1. Aldactone 25 mg daily.  2. Ramipril 10 mg daily.  3. KCl 20 mEq b.i.d.  4. Carvedilol 25 mg b.i.d.  5. Furosemide 80 mg daily.  6. Digoxin 0.25 mg daily.  7. Warfarin as directed.  8. Darvocet p.r.n.   PHYSICAL EXAMINATION:  GENERAL:  Pleasant woman in no acute distress.  VITAL SIGNS:  The weight is 230, 13 pounds more than in August.  Blood  pressure is 105/75, heart rate is 60 and regular, and respiration is 14.  NECK:  No jugular venous distention; no carotid bruits.  LUNGS:  Clear.  CARDIAC:  Normal first and second heart sounds; modest systolic ejection  murmur.  THORAX:  AICD over the left chest.  EXTREMITIES:  Trace edema.   Chemistry profile is normal except for slightly elevated creatinine of  1.37.  This compares favorably to a creatinine of 2.1 in February of  this year.  BNP level is 74.  Chest x-ray shows no acute findings.   IMPRESSION:  Although, she has gained 13 pounds, [name] has not  much objective evidence for congestive heart failure other than the  monitoring of her pacemaker device.  We will resume furosemide dose to  80 mg daily and monitor electrolytes.  Her device will be reinterrogated  in 1 month.  I will see this nice woman again in 4 months.     Sincerely,      Gerrit Friends. Dietrich Pates, MD, [facility]  Electronically Signed    RMR/MedQ  DD: 07/21/2008  DT: 07/22/2008  Job #: [id number]

## 2011-01-14 NOTE — Cardiovascular Report (Signed)
NAMERINDI, BEECHY NO.:  [id number]   MEDICAL RECORD NO.:  [id number]          PATIENT TYPE:  INP   LOCATION:  2029                         FACILITY:  [facility]   PHYSICIAN:  Veverly Fells. Excell Seltzer, MD  DATE OF BIRTH:  October 10, 1932   DATE OF PROCEDURE:  02/19/2009  DATE OF DISCHARGE:                            CARDIAC CATHETERIZATION   PROCEDURE:  Right heart catheterization.   PROCEDURAL INDICATIONS:  [name] is a 77 year old woman with  history of severe nonischemic cardiomyopathy.  She was admitted with  cardiorenal syndrome and had acute renal failure.  Her diuretics were  held and her creatinine has improved.  She has had marked shortness of  breath.  Her LV function has improved with medical therapy and cardiac  resynchronization.  The patient was referred for right heart  catheterization in the setting of acute renal failure and acute systolic  heart failure.   DESCRIPTION OF PROCEDURE:  Risks and indications of procedure were  reviewed with the patient and informed consent was obtained.  The right  groin was prepped, draped, and anesthetized with 1% lidocaine.  Using  the modified Seldinger technique, a 7-French sheath was placed in the  right femoral vein.  A Swan-Ganz catheter was used for the right heart  catheterization.  Oxygen saturation was drawn from the pulmonary artery.  Fick cardiac output was calculated.  Thermodilution cardiac output was  also performed.  The patient tolerated the procedure well.  There were  no immediate complications.   FINDINGS:  Right atrial pressure mean of 6.  Right ventricular pressure  33/7.  Pulmonary artery pressure 29/15 with a mean of 20.  Pulmonary  capillary wedge pressure is 10.  Cardiac output by the Fick method 7.6  L/min.  Cardiac index is 3.5.  Cardiac output by thermodilution is 5.9  L/min.  Cardiac index is 2.7.   Aortic oxygen saturation by pulse ox was 96%.  Pulmonary artery oxygen  saturation was  65%.   ASSESSMENT:  Normal hemodynamics.   PLAN:  We will resume Coumadin tonight.      Veverly Fells. Excell Seltzer, MD  Electronically Signed     MDC/MEDQ  D:  02/19/2009  T:  02/20/2009  Job:  [id number]   cc:   Angus G. Renard Matter, MD  Gerrit Friends. Dietrich Pates, MD, [facility]

## 2011-01-14 NOTE — Discharge Summary (Signed)
[name], [name] NO.:  [id number]   MEDICAL RECORD NO.:  [id number]          PATIENT TYPE:  INP   LOCATION:  3733                         FACILITY:  [facility]   PHYSICIAN:  Doylene Canning. Ladona Ridgel, MD    DATE OF BIRTH:  Jul 03, 1933   DATE OF ADMISSION:  01/03/2008  DATE OF DISCHARGE:  01/04/2008                               DISCHARGE SUMMARY   ALLERGIES:  False reading on a PPD.   FINAL DIAGNOSIS:  On discharging day one status post implant of a  Medtronic CONCERTO CRT-D.   SECONDARY DIAGNOSES:  1. Nonischemic cardiomyopathy.      a.     Ejection fraction of 20-25% by echocardiogram on September 28, 2007.  2. New York Heart Association class III chronic systolic congestive      heart failure.  3. Broad QRS.   PROCEDURE:  On Jan 03, 2008, implant of the Medtronic CONCERTO,  biventricular cardioverter-defibrillator, and defibrillator threshold  study less than or equal to 15 joules, Dr. Lewayne Bunting.  The patient  has had no postprocedural complications.  There was no hematoma at the  ICD pocket.  No bruising.  The patient's device has been interrogated.  All values are within normal limits.  The chest x-ray has been taken  that shows that the leads are in appropriate position.   BRIEF HISTORY:  [name] is a 77 year old female referred by Dr.  Dietrich Pates for cardioverter-defibrillator.   The patient has a history of nonischemic cardiomyopathy and left  ventricular dysfunction.  Her congestive heart failure symptoms have  gotten worse over the last year and her QRS duration has increased.  The  patient qualifying for biventricular cardioverter-defibrillator.  The  risks, benefits, and expectations have been described to the patient,  she wishes to proceed.   HOSPITAL COURSE:  The patient presents electively on Jan 03, 2008.  She  underwent implantation of a Medtronic CONCERTO device the same day by  Dr. Ladona Ridgel.  She is ready for discharge on postprocedure  day #1.  As  mentioned above, there is no hematoma at the pocket.  The device  interrogates well.  The chest x-ray shows no pneumothorax and leads are  in appropriate position.  She is seen by Dr. Lewayne Bunting prior to  discharge.  Mobility and incision care have been discussed with the  patient.   She was discharged on the following medication:  1. Digoxin 0.25 mg daily.  2. Lasix 80 mg daily.  3. Carvedilol 25 mg twice daily.  4. Potassium chloride 20 mEq twice daily.  5. Altace 10 mg daily.  6. Aldactone 25 mg daily.  7. Darvocet-N 100 1-2 tablets every 4-6 hours as needed for pain and      the patient has been given a prescription for Darvocet at      discharge.   FOLLOWUP APPOINTMENTS:  1. ICD Clinic [facility] at [address] on Monday, Jan 17, 2008, at [employer].  2. She should see Dr. Dietrich Pates at [place] office on Thursday, Jan 27, 2008, at 3.  3. To see Dr. Ladona Ridgel, [place] [facility] office.  His office will send      appointment date in August 2009.   LABORATORY STUDIES:  Pertinent to this admission were drawn on December 29, 2007, white cells 8.4, hemoglobin 11, hematocrit 32.3, and platelets of  332.  Sodium 138, potassium 4.8, chloride 104, carbonate 23, glucose  104, BUN 32, and creatinine 2.11.  Protime is 14.5 and INR is 1.1.  We  will get a basic metabolic panel when she comes to the __________  Clinic, which is scheduled for Jan 17, 2008.  The patient is on an ACE-I  inhibitor.  This may need to be modified.      Maple Mirza, PA      Doylene Canning. Ladona Ridgel, MD  Electronically Signed    GM/MEDQ  D:  01/04/2008  T:  01/04/2008  Job:  [id number]   cc:   Angus G. Renard Matter, MD  Gerrit Friends. Dietrich Pates, MD, [facility]  Doylene Canning. Ladona Ridgel, MD

## 2011-01-14 NOTE — Group Therapy Note (Signed)
NAMEKELSYE, LOOMER           ACCOUNT NO.:  [id number]   MEDICAL RECORD NO.:  [id number]          PATIENT TYPE:  INP   LOCATION:  A211                          FACILITY:  [facility]   PHYSICIAN:  Angus G. Renard Matter, MD   DATE OF BIRTH:  Mar 30, 1933   DATE OF PROCEDURE:  DATE OF DISCHARGE:                                 PROGRESS NOTE   This patient was admitted with congestive heart failure.  She does have  chronic nonischemic cardiomyopathy, normocytic anemia.  She is being  diuresis feeling some better.  Symptoms have improved.   OBJECTIVE:  VITAL SIGNS:  Blood pressure 124/65, respiration 20, pulse  80, temperature 98.8, O2 sats range of 97-100%.  The patient has  negative fluid balance of 575.  HEART:  Regular rhythm.  LUNGS:  Decreased breath sounds bilaterally.  ABDOMEN:  No palpable organs or masses.  Minimal ankle edema.   ASSESSMENT:  The patient continues to diurese with diagnosis of  nonischemic cardiomyopathy and congestive heart failure.  Previous CVA.   PLAN:  To continue to slowly ambulate.  Continue current regimen.  Repeat BUN and labs.      Angus G. Renard Matter, MD  Electronically Signed     AGM/MEDQ  D:  09/30/2007  T:  09/30/2007  Job:  [id number]

## 2011-01-14 NOTE — Assessment & Plan Note (Signed)
[facility]                         ELECTROPHYSIOLOGY OFFICE NOTE   QUINTA, EIMER                  MRN:          [id number]  DATE:12/29/2007                            DOB:          [birth year]    Ms. Argabright is referred, today, by Dr. Dietrich Pates for evaluation and  consideration for prophylactic ICD insertion.  The patient is a very  pleasant, 77 year old woman with a history of longstanding nonischemic  cardiomyopathy and LV dysfunction dating back to 2003.  She has had  worsening congestive heart failure over the last year; and during this  time, her QRS duration has increased from just over 100 msec now to 120-  130 msec.  She is referred, now, for consideration for prophylactic ICD  insertion.   CURRENT MEDICATIONS INCLUDE:  1. Digoxin 250 mcg daily.  2. Lasix 80 mg daily.  3. Carvedilol 25 twice daily.  4. Potassium 20 mEq twice daily.  5. Altace 10 mg daily.  6. Aldactone 25 mg daily.   FAMILY HISTORY:  Noncontributory.   SOCIAL HISTORY:  The patient is married.  She denies tobacco or ethanol  abuse.   REVIEW OF SYSTEMS:  Notable for fatigue and weakness.  She has some mild  arthritic complaints in her knees and hips.  She has a generalized  weakness throughout.  She has PND.  She has orthopnea.  She was  hospitalized in January with worsening heart failure.  She has a history  of dizziness and peripheral edema.  The rest of her review of systems  were negative.   PHYSICAL EXAM:  Notable for her being a pleasant 77 year old woman in no  acute distress.  Her blood pressure was 116/65, the pulse was 90 and regular.  The  respirations were 20.  The weight was 213 pounds.  HEENT:  Normocephalic, atraumatic.  Pupils equal, and round.  Oropharynx  was moist.  Sclerae were anicteric.  NECK:  Revealed no jugular distention and no thyromegaly.  Trachea was  midline.  The carotids were 2+ and symmetric.  LUNGS:  Clear bilaterally to  auscultation.  No wheezes, rales, or  rhonchi were present.  There was no increased work of breathing.  CARDIOVASCULAR EXAM:  Revealed a regular rate and rhythm with normal S1  and a split S2.  There is soft S3 gallop present.  PMI was enlarged and  laterally displaced.  ABDOMINAL EXAM:  Soft, nontender, nondistended.  There was no  organomegaly.  The bowel sounds were present with no rebound or  guarding.  EXTREMITIES:  Demonstrated a trace peripheral edema bilaterally.  No  cyanosis or clubbing  were present.  NEUROLOGIC EXAM:  Alert and oriented x3.  Cranial nerves intact.  Strength was 5/5 and symmetric.   IMPRESSION:  1. Nonischemic cardiomyopathy.  2. Congestive heart failure, class III.  3. Intraventricular conduction delay, QRS duration 120 msec with      prolongation of the PR interval and the QRS approaching 130 msec.   DISCUSSION:  I have discussed treatment options with the patient and her  husband in detail.  The risks, benefits, goals, and expectations of ICD  implantation with a biventricular device have been discussed with the  patient, and they wish to proceed with this.  This will be scheduled at  the earliest possible convenient time.     Doylene Canning. Ladona Ridgel, MD  Electronically Signed    GWT/MedQ  DD: 12/29/2007  DT: 12/29/2007  Job #: [id number]   cc:   Angus G. Renard Matter, MD

## 2011-01-14 NOTE — H&P (Signed)
[name], [name]           ACCOUNT NO.:  [id number]   MEDICAL RECORD NO.:  [id number]          PATIENT TYPE:  INP   LOCATION:  A211                          FACILITY:  [facility]   PHYSICIAN:  Angus G. Renard Matter, MD   DATE OF BIRTH:  October 01, 1932   DATE OF ADMISSION:  09/27/2007  DATE OF DISCHARGE:  LH                              HISTORY & PHYSICAL   This 77 year old African American female was seen in the office on the  day of admission with a chief complaint of dyspnea with exertion.  The  patient states that for approximately 5 days she has had progressive  shortness of breath.  The patient does have a prior history of  nonischemic cardiomyopathy and previous history of congestive heart  failure.  The patient has been admitted to the hospital previously for  these problems and had been seen by [facility] previously.  The  patient did have a mildly elevated BNP, and x-ray of chest which showed  evidence of pulmonary edema.  It was suggested that the patient be  admitted to the hospital for further more intensive diuresis and  monitoring.   SOCIAL HISTORY:  The patient does not smoke or drink alcohol.   FAMILY HISTORY:  See previous record   PAST MEDICAL AND SURGICAL HISTORY:  1. Remote history of hysterectomy for fibroid uterus.  2. The patient had a CVA several years ago.  Has been on chronic      anticoagulant ablation therapy.  3. She also developed congestive heart failure, and it was felt to be      due to nonischemic cardiomyopathy.  She had been hospitalized on      several occasions for this problem.   ALLERGIES:  TB TESTING.   MEDICATION:  1. Digoxin 0.25 mg daily.  2. KCl 20 mEq b.i.d.  3. Aldactone 25 mg daily.  4. Ramipril 10 mg daily.  5. Carvedilol 25 mg b.i.d.  6. Furosemide 60 mg daily.  7. Warfarin 5 mg daily.   REVIEW OF SYSTEMS:  HEENT: Negative.  CARDIOPULMONARY:  The patient has  had dyspnea with exertion, some slight orthopnea over the past  few days.  No peripheral edema.  GI: No bowel irregularity or bleeding.  GU: No  dysuria or hematuria.   PHYSICAL EXAMINATION:  GENERAL:  Alert-appearing female.  VITAL SIGNS:  Blood pressure 135/87, respirations 16, pulse 102,  temperature 97.9.  HEENT:  Eyes: PERRLA.  TMs negative.  Oropharynx benign.  NECK:  Supple.  No JVD or thyroid abnormalities.  BREASTS:  Normal.  No masses.  HEART:  Regular rhythm, no murmurs.  LUNGS:  Diminished breath sounds bilaterally.  ABDOMEN:  No palpable organs or masses.  EXTREMITIES:  Free of edema.   DIAGNOSES:  1. Congestive heart failure.  2. Nonischemic cardiomyopathy.  3. History of cerebrovascular accident, with chronic anticoagulation.      Angus G. Renard Matter, MD  Electronically Signed     AGM/MEDQ  D:  09/27/2007  T:  09/28/2007  Job:  [id number]

## 2011-01-14 NOTE — Op Note (Signed)
[name], [name] NO.:  [id number]   MEDICAL RECORD NO.:  [id number]          PATIENT TYPE:  INP   LOCATION:  2034                         FACILITY:  [facility]   PHYSICIAN:  Doylene Canning. Ladona Ridgel, MD    DATE OF BIRTH:  [birth year]   DATE OF PROCEDURE:  11/09/2008  DATE OF DISCHARGE:                               OPERATIVE REPORT   PROCEDURE PERFORMED:  Electrophysiologic study followed by AV node  ablation.   INTRODUCTION:  The patient is a 77 year old woman with a history of an  ischemic cardiomyopathy and congestive heart failure.  She is recently  bothered by recurrent ICD shocks.  Interrogation of her defibrillator  demonstrated multiple episodes of atrial tachycardia at cycle length of  280-290 milliseconds.  This is a 1-to-1 atrial tachycardia.  Because of  multiple ICD discharges secondary to her one-to-one atrial tachycardia,  she is now referred for electrophysiologic study and catheter ablation  either of atrial tachycardia feasible or AV node ablation in the setting  of a previous Bi-V ICD.   PROCEDURE:  After informed consent was obtained, the patient was taken  to diagnostic EP lab in the fasting state.  After usual preparation and  draping, a 6-French quadripolar catheter was inserted percutaneously  into the right femoral vein and advanced to the right ventricle.  A 5-  French quadripolar catheter was served percutaneously in the right  femoral vein and advanced to the His bundle region.  After measurement  of the basic intervals, rapid ventricular pacing was carried out from  the RV apex demonstrating VA dissociation at 600 milliseconds.  Programmed ventricular stimulation was carried out from the RV apex at  600 milliseconds also demonstrating VA dissociation.  Next, the EP  catheter that was in the right ventricle was maneuvered into the right  atrium.  The His catheter was remained in the AV node region.  Rapid  atrial pacing was carried out along  with programmed atrial stimulation  from the high right atrium.  During programmed atrial stimulation, the  S1-S2 interval was stepwise decreased from 500 milliseconds down to  atrial refractoriness at 400 milliseconds.  S1-S2 was decreased and were  also delivered utilizing S1 drive cycle length of 161 milliseconds.  S1,  S2, S3 stimuli were delivered down to atrial refractoriness.  Next,  there was no inducible SVT.  Next, rapid atrial pacing was carried out  rather from the right atrium rather at 500 milliseconds and stepwise  decreased down to 200 milliseconds.  The AV node cycle length was  demonstrated to be at 410 milliseconds (AV Wenckebach).  During rapid  atrial pacing, the P interval was less than the RR interval.  There was  no inducible SVT.  At this point with no inducible SVT demonstrated and  with documented SVT by way of the patient's defibrillator, a decision  was made to proceed with AV node ablation as the patient previously had  a biventricular ICD placed.  The 7-French quadripolar ablation catheter  was then inserted percutaneously through the right femoral vein and  advanced into the right atrium.  Mapping was carried  out in the AV node  region.  A total of two RF energy applications were delivered.  During  the first RF energy application, complete heart block was demonstrated.  A single bonus RF energy application was delivered and the patient was  then observed for 30 minutes.  During this time, there was no evidence  of any recurrent AV-node conduction.  There was a junctional escape  rhythm at 40 beats per minute.  At this point, the catheters were  removed.  Hemostasis was assured.  The patient's device was  reprogrammed.  She was returned to her room in satisfactory condition.   COMPLICATIONS:  There were no immediate procedure complications.   RESULTS:  This demonstrates no inducible SVT and the patient with a  prior history of SVT with 1-to-1 atrial  tachycardia, rate over 200 beats  per minute.  It demonstrates successful AV-node ablation with creation  of complete heart block.  Following the procedure, the catheters were  removed.  Hemostasis was assured.  The patient's device  was reprogrammed to the DDD mode at a lower rate of 80 beats per minute  and she was returned to her room in satisfactory condition.  This  demonstrates a negative EP study for inducible SVT followed by  successful AV-node ablation in a patient with recurrent SVT and multiple  ICD shocks despite medical therapy.      Doylene Canning. Ladona Ridgel, MD  Electronically Signed     GWT/MEDQ  D:  11/09/2008  T:  11/10/2008  Job:  [id number]   cc:   Angus G. Renard Matter, MD  Gerrit Friends. Dietrich Pates, MD, [facility]

## 2011-01-14 NOTE — Assessment & Plan Note (Signed)
[facility]                         ELECTROPHYSIOLOGY OFFICE NOTE   [name], [name]                  MRN:          [id number]  DATE:12/13/2008                            DOB:          12/26/1932    [name] returns today for followup.  She is a very pleasant, middle-  aged woman with an nonischemic cardiomyopathy and congestive heart  failure who has severe LV dysfunction and left bundle-branch block and  hypertension who underwent biventricular ICD implantation several months  ago.  She was hospitalized recently with worsening heart failure and was  diuresed.  She returns today for followup.  Despite my warning, she  continues to eat too much salt and is fairly reluctant to change her  dietary ways.   CURRENT MEDICATIONS:  1. Ramipril 5 mg a day.  2. Digoxin 0.25 a day.  3. Furosemide 80 a day.  4. Carvedilol 25 mg twice daily.  5. Potassium 20 mEq daily.  6. Coumadin as directed.  7. Fish oil supplement.  8. Aldactone 25 twice daily.   PHYSICAL EXAMINATION:  GENERAL:  She is a pleasant, middle-aged woman in  no distress.  VITAL SIGNS:  The blood pressure was 148/90, the pulse 84 and regular,  respirations were 18, the weight was 228 pounds.  NECK:  No jugular venous distention.  LUNGS:  Clear bilaterally except for basilar rales bilaterally.  No  wheezes or rhonchi are present.  CARDIOVASCULAR:  Distant with a regular rate and rhythm.  Normal S1 and  S2.  ABDOMEN:  Obese, nontender, nondistended.  There is no organomegaly.  EXTREMITIES:  Trace peripheral edema bilaterally.  There is no cyanosis  or clubbing.   Interrogation of her defibrillator was carried out today.  This  demonstrates a Librarian, academic.  Impedances were 656 in the atrium,  440 in the right ventricle, 736 in the left ventricle.  Threshold in the  LV was 1.5 at 0.4.  The patient had no intercurrent IC therapies.  She  did have several episodes of  nonsustained VT.  She also has very brief  episodes of atrial fibrillation.  OptiVol fluid index was elevated.  It  has been that way for approximately 2 weeks.  She was 99% V-paced.   IMPRESSION:  1. Nonischemic cardiomyopathy.  2. Congestive heart failure.  3. Hypertension.  4. Ongoing dietary indiscretion for sodium.   DISCUSSION:  I discussed treatment options with [name].  I  recommend that she go from eating canned vegetables to frozen  vegetables.  She is reluctant to do this.  I have asked her that she  decrease her salt intake.  She is reluctant to do this as well.  I have  asked that she increase her dose of furosemide from 80 mg daily to 80 mg  in the morning and 40 mg after lunch and do this for 3-4 days before go  back on 80 mg daily.     Doylene Canning. Ladona Ridgel, MD  Electronically Signed    GWT/MedQ  DD: 12/13/2008  DT: 12/14/2008  Job #: [id number]   cc:   Angus G. Renard Matter, MD

## 2011-01-14 NOTE — Group Therapy Note (Signed)
[name], [name]           ACCOUNT NO.:  [id number]   MEDICAL RECORD NO.:  [id number]          PATIENT TYPE:  INP   LOCATION:  A211                          FACILITY:  [facility]   PHYSICIAN:  Angus G. Renard Matter, MD   DATE OF BIRTH:  1933/08/19   DATE OF PROCEDURE:  DATE OF DISCHARGE:                                 PROGRESS NOTE   This patient was admitted with congestive heart failure and acute and  chronic nonischemic cardiomyopathy, normocytic anemia.  She is being  diuresed.  She did have evidence of CHF on a chest x-ray.  Symptoms  improve with Lasix her anemia is being evaluated.   OBJECTIVE:  VITAL SIGNS:  Blood pressure 137/74, respiration 18, pulse  79, temperature 97.9, O2 sats between 96 and 100%.  She continues to diurese and has a negative fluid balance.  Most current  laboratory studies remain essentially normal.  HEART:  Heart regular rhythm.  LUNGS:  Diminished breath sounds over lower lobes.  ABDOMEN:  No palpable organs or masses.  Minimal ankle edema.   ASSESSMENT:  The patient was admitted with congestive heart failure.  She does have nonischemic cardiomyopathy, previous cerebrovascular  accident with chronic anticoagulation.  Plan to continue current  regimen.  Continue to monitor a CBC, BMET, 2-D echo.   PLAN:  Continue current regimen.      Angus G. Renard Matter, MD  Electronically Signed     AGM/MEDQ  D:  09/29/2007  T:  09/29/2007  Job:  [id number]

## 2011-01-14 NOTE — Letter (Signed)
October 07, 2007    Angus G. Renard Matter, MD  [address]  [place], [place] [address]   RE:  JODECI, RINI  MRN:  [id number]  /  DOB:  1932/10/24   Dear [name]:   Ms. Lindenberger returns to the office for continued assessment treatment of  cardiomyopathy with a recent hospitalization for congestive heart  failure.  She has improved, but still reports increased dyspnea on  exertion, decreased exercise tolerance.  The only change in her medicine  was to increase her dose of furosemide from 60 to 80 mg daily.   EXAM:  Pleasant hefty woman in no acute distress.  The weight is 210, 5 pounds less than in July of last year.  Blood  pressure 110/70, heart rate 81 with frequent premature.  NECK:  No jugular venous distention.  LUNGS:  Clear.  CARDIAC:  Normal first and second heart sounds; fourth heart sound  present; modest systolic ejection murmur.  ABDOMEN:  Soft and nontender; no organomegaly.  EXTREMITIES:  Minimal, if any, edema.   Recent most recent chest x-ray from January 26 showed vascular  redistribution without frank pulmonary edema.  Her most recent chemistry  profile was unremarkable.   IMPRESSION:  Ms. Hoot is symptomatic, but objectively, appears to  have relatively well compensated congestive heart failure.  We will  increase the intensity of her medical therapy as tolerated in an attempt  to reduce her symptoms.  For now, she will simply take her furosemide in  divided doses, but may well need an increase in daily dosage.  I will  reassess this nice woman in 1 month.  A chemistry profile and CBC will  be obtained in 3 weeks.    Sincerely,      Gerrit Friends. Dietrich Pates, MD, [facility]  Electronically Signed    RMR/MedQ  DD: 10/07/2007  DT: 10/08/2007  Job #: [id number]

## 2011-01-14 NOTE — Assessment & Plan Note (Signed)
[facility]                         ELECTROPHYSIOLOGY OFFICE NOTE   [name], [name]                  MRN:          [id number]  DATE:01/19/2008                            DOB:          [birth year]    [name] is here, today, for followup of left arm swelling after BiV-  ICD insertion carried out 3 weeks ago.  She notes that in the interim  that her shortness of breath has improved and her dyspnea is better.  She also questions why her Coumadin was stopped back in February, and I  can not understand why this was the case by reviewing her records.  She  denies chest pain or fevers or chills.  Her ICD insertion site has  healed nicely.   CURRENT MEDICATIONS:  1. Aldactone 25 mg a day.  2. Altace 10 a day.  3. Potassium 20 twice daily.  4. Carvedilol 25 twice daily.  5. Lasix 80 a day.  6. Digoxin 0.25 mcg daily.   PHYSICAL EXAM:  She is a pleasant well-appearing woman in no acute  distress.  Blood pressure today was 110/68; the pulse 64 and regular; respirations  were 18.  The weight was 213 pounds.  NECK:  Revealed no jugular distention.  LUNGS:  Clear bilaterally to auscultation.  No wheezes, rales, or  rhonchi are present.  There is no increased work of breathing.  CARDIOVASCULAR EXAM:  Reveals a regular rate and rhythm.  Normal S1 and  S2.  The PMI is enlarged and nondisplaced.  ICD insertion site was  healed nicely.  The left arm was slightly swollen compared to the right.  There was no obvious tenderness.  EXTREMITIES:  Demonstrated no peripheral edema.   INTERROGATION OF HER DEFIBRILLATOR:  Demonstrates a Medtronic Concerto  the P and R waves were 4 and 6 respectively.  The impedance 672 in the  A, 456 in the RV, and 584 in the LV with threshold 0.5 at 0.4 both in  the right atrium, right ventricle,. and left ventricle.  Of note, the  patient had some diaphragmatic stimulation right after device was  placed, and this was  resolved by decreasing her outputs in the LV.   IMPRESSION:  1. Congestive heart failure.  2. Status post BiV-ICD insertion.  3. Nonischemic cardiomyopathy.  4. Mild left arm swelling after device implant.  5. History of stroke previously on Coumadin, question whether this is      atrial fibrillation or not.   DISCUSSION:  [name] is stable today.  Her left arm may represent a  left upper extremity DVT.  I can see no reason to not go ahead and start  her on Coumadin, and I have asked that she keep her left arm elevated.  I think that she probably has two indications for Coumadin in the short-  term; presumptive treatment of her DVT, and in the long-term presumptive  treatment of atrial fibrillation, or prior stroke for which she was on  Coumadin in the past.  We will plan on seeing her back in the office in  several months.  I have asked that you  go ahead and start on Coumadin,  and follow up and with Dr. Renard Matter who has checked her Coumadin in the  past.  I specifically asked her to get her Coumadin checked very early  next week.     Doylene Canning. Ladona Ridgel, MD  Electronically Signed    GWT/MedQ  DD: 01/19/2008  DT: 01/19/2008  Job #: [id number]   cc:   Angus G. Renard Matter, MD

## 2011-01-14 NOTE — Assessment & Plan Note (Signed)
[facility]                         ELECTROPHYSIOLOGY OFFICE NOTE   [name], [name]                  MRN:          [id number]  DATE:04/26/2008                            DOB:          Jun 11, 1933    [name] returns today for followup.  She is a very pleasant obese  elderly woman with a history of biventricular ICD insertion.  The  patient unfortunately developed a left upper extremity DVT and has been  on Coumadin.  She returns today for followup.  She has had one ICD shock  secondary to SVT.  She had no specific complaints today, though she has  admitted that her activity has been very light, as she has concerns  about hurting things with her defibrillator if she overexerts herself.  Her blood pressure today was 141/71, the pulse was 86, respirations were  18, and the weight was 217 pounds.  Neck revealed no jugular venous  distention.  The left arm was slightly more swollen than the right arm.  Abdominal exam was soft and nontender.  Extremities demonstrated no  edema.  The ICD insertion site was healed nicely.   MEDICATIONS:  1. Aldactone 25 a day.  2. Altace 5 daily.  3. Potassium supplement 20 twice daily.  4. Carvedilol 25 twice daily.  5. Lasix 80 a day.  6. Digoxin 0.25 daily.  7. Coumadin.   Interrogation of her defibrillator demonstrates a Medtronic Concerto,  P&R was 3 and 7.3 respectively.  The impedance 680 in the A, 440 in the  RV, 704 in the LV, threshold 0.5 at 0.4 both in the RA, RV, and LV.  Battery voltage was 3.2 volts.  Today, the patient's outputs were turned  down to 2 at 0.4 in the A, 2.5 at 0.4 both in the RV and the LV.  She  was 95% V-paced.  Review of her electrograms in her tachycardia  demonstrates a one-to-one tachycardia with initiation with PACs, all  consistent with AV node reentry tachycardia.   IMPRESSION:  1. Nonischemic cardiomyopathy.  2. Congestive heart failure.  3. Left bundle-branch  block.  4. Recurrent SVT pace terminated with antitachycardiac pacing therapy.   DISCUSSION:  [name] is stable.  I have encouraged her to increase  her activity and to keep her left arm elevated when she is sitting  around.  She will continue Coumadin for now.  I will see her back in the  office in May 2010, sooner should she have worsening symptoms.      Doylene Canning. Ladona Ridgel, MD  Electronically Signed    GWT/MedQ  DD: 04/26/2008  DT: 04/27/2008  Job #: [id number]   cc:   Angus G. Renard Matter, MD

## 2011-01-14 NOTE — Consult Note (Signed)
[name], [name]           ACCOUNT NO.:  [id number]   MEDICAL RECORD NO.:  [id number]          PATIENT TYPE:  INP   LOCATION:  A211                          FACILITY:  [facility]   PHYSICIAN:  Gerrit Friends. Dietrich Pates, MD, FACCDATE OF BIRTH:  Apr 08, 1933   DATE OF CONSULTATION:  09/28/2007  DATE OF DISCHARGE:                                 CONSULTATION   CARDIOLOGIST:  Gerrit Friends. Dietrich Pates, MD, [facility]   REASON FOR CONSULTATION:  Congestive heart failure.   HISTORY OF PRESENT ILLNESS:  [name] is a 77 year old female  patient with a nonischemic cardiomyopathy with an EF of 25% by  echocardiogram on September 07, 2006 who was in her usual state of health  until approximately 2 weeks ago when she began to notice increasing  shortness of breath.  She states she usually has chronic dyspnea on  exertion.  She describes NYHA class III symptoms.  Over the last 2  weeks, she has progressively worsened to class IIIB to IV dyspnea.  She  notes three-pillow orthopnea, as well as occasional PND.  She also notes  occasional tachypalpitations that occur at rest.  These have been  ongoing for the last several years.  They last for several minutes.  She  denies any associated dizziness or syncope.  When the palpations  subside, she does describe some chest tightness.  However, she denies  any exertional chest pain or arm or jaw pain.  She denies any exertional  nausea or diaphoresis.  The patient saw Dr. Renard Matter in the office on the  day of admission and was placed in [facility] for further  evaluation and treatment.  We are asked to further evaluate.   PAST MEDICAL HISTORY:  1. Nonischemic cardiomyopathy of 25%.  2. Chronic systolic congestive heart rate with NYHA class III      symptoms.  3. Normal coronaries by catheterization in 2003.  4. Reported history of hyperlipidemia but no therapy.  5. History of cataracts.  6. Hypertension.  7. History of cerebrovascular accident with residual  left-sided      weakness.      a.     Chronic Coumadin therapy followed by Dr. Renard Matter.  8. Status post total abdominal hysterectomy and bilateral salpingo-      oophorectomy secondary to uterine fibroids.  9. Echocardiogram on September 08, 2007.  Mild to moderate RV      dysfunction, moderate to marked mitral irritation, mild to moderate      LVH, EF 20-25%.   MEDICATIONS PRIOR TO ADMISSION:  1. Digoxin 0.25 mg daily.  2. Potassium chloride 20 mEq b.i.d.  3. Aldactone 25 mg daily.  4. Ramipril 10 mg daily.  5. Carvedilol 25 mg b.i.d.  6. Furosemide 60 mg daily.  7. Warfarin 5 mg daily.   ALLERGIES:  She is allergic to PURIFIED PROTEIN DERIVATIVE.   SOCIAL HISTORY:  She lives in [place] with her husband.  She has four  children and raised one of her grandchildren.  She is retired from  Designer, fashion/clothing.  She has an insignificant history of tobacco use and denies  any alcohol use.  FAMILY HISTORY:  Significant for coronary artery disease.  Her mother  died at a late age from a myocardial infarction.   REVIEW OF SYSTEMS:  Please see HPI.  She denies fevers, chills, sore  throat, rash, dysuria, hematuria, bright red blood per rectum, melena,  dysphagia, odynophagia.  She has residual left-sided weakness from her  CVA.  The rest of the review of systems are negative.   PHYSICAL EXAMINATION:  GENERAL:  She is a well-nourished, well-developed  female.  VITAL SIGNS:  Blood pressure 127/66, pulse 85, respirations 18,  temperature 97.8, oxygen saturation 98% on 2 liters.  Weight today is 99  kg.  She is negative approximately 1.7 liters since admission.  HEENT:  Normal.  NECK:  Without JVD.  LYMPHS:  Without lymphadenopathy.  ENDOCRINE:  Without thyromegaly.  CARDIAC:  Normal S1 and S2.  Regular rate and rhythm.  I cannot  appreciate an S3.  She has a 1/6 to 2/6 systolic ejection murmur heard  best at the apex.  LUNGS:  Decreased breath sounds bilaterally without crackles.  SKIN:  Without  rash/  ABDOMEN:  Soft, nontender with normoactive bowel sounds.  No  organomegaly.  EXTREMITIES:  Without clubbing, cyanosis or edema.  MUSCULOSKELETAL:  Without joint deformity.  NEUROLOGIC:  Alert and oriented x3.  Cranial nerves II-XII grossly  intact.  VASCULAR:  Without carotid bruits bilaterally.   CHEST X-RAY:  Cardiomegaly with pulmonary venous hypertension and wet  pleural fissures consistent with congestive heart failure, COPD and  emphysema.   ELECTROCARDIOGRAM:  EKG renal with normal sinus rhythm, heart rate of  81, normal axis.  Inferolateral T-wave inversions, PACs.  QRS duration  of 100 ms.  QTC 527 ms.   LABORATORY DATA:  Hemoglobin 10.8, hematocrit 31.9, MCV 89, white count  6100, platelet count 211,000.  Sodium 139, potassium 3.8, BUN 19,  creatinine 1.43, glucose 101, calcium 8.4, total protein 5.9, albumin 3,  INR 3.1, CK-MB negative x2.  Troponin #1 of 0.08, number #2 of 0.06.   IMPRESSION:  1. Acute-on-chronic systolic congestive heart failure.  2. Nonischemic cardiomyopathy of 25%.  3. Normal coronary arteries by catheterization in 2003.  4. History of tachypalpitations.  5. History of CVA on chronic Coumadin therapy followed by her primary      care physician.  6. Hypertension.  7. Reported history of hyperlipidemia.  8. 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  9. Normocytic anemia.  10.Prolonged QT.   PLAN:  The patient presents with acute-on-chronic systolic congestive  heart failure.  Her BNP is minimally elevated at 240, but she does have  evidence of CHF on her chest x-ray, and her symptoms have definitely  changed over the last 2 weeks.  At this point in time, will continue her  IV Lasix at 40 mg b.i.d.  She may likely be able to change to p.o.  dosing tomorrow.  Her BMET will be followed closely as she is on  Aldactone, ACE inhibitor and digoxin therapy.  An echocardiogram is  currently pending.  It has been over a year since her last study.   She  is considering ICD therapy.  She would like to discuss this further.  She can certainly be arranged for evaluation post discharge.  Iron  studies will be checked, as well as B-12 and folate and stool guaiacs  given her anemia.  We will continue to follow this patient with you  throughout this admission.  Thank you very much for the consultation.  Tereso Newcomer, PA-C      Gerrit Friends. Dietrich Pates, MD, [facility]  Electronically Signed    SW/MEDQ  D:  09/28/2007  T:  09/28/2007  Job:  [id number]   cc:   Angus G. Renard Matter, MD  Fax: [phone]

## 2011-01-14 NOTE — Group Therapy Note (Signed)
[name], [name]           ACCOUNT NO.:  [id number]   MEDICAL RECORD NO.:  [id number]          PATIENT TYPE:  INP   LOCATION:  A211                          FACILITY:  [facility]   PHYSICIAN:  Angus G. Renard Matter, MD   DATE OF BIRTH:  March 19, 1933   DATE OF PROCEDURE:  DATE OF DISCHARGE:                                 PROGRESS NOTE   This patient was admitted with congestive heart failure.  She does have  chronic nonischemic cardiomyopathy and normocytic anemia is being  diuresed and continues to improve, although she still has fatigue.  Cardiology felt that this could possibly reflect low output.  Labs were  ordered for yesterday.  TSH was checked which was normal 2.703, blood  gases pH 7.427 with a pCO2 34.5, pO2 83.8 and D-dimer within normal  range 0.22, a BNP was less than 30.  The rest was essentially normal.   Blood pressure of 149/73, respiration 22, pulse 69, temperature 98.2.  The patient has positive fluid balance.  LUNGS:  Clear to P&A.  HEART:  Regular rhythm.  ABDOMEN:  No palpable organs or masses.  Prior carotid duplex showed no  evidence of stenosis.   ASSESSMENT:  The patient was admitted with above stated problems.  She  has diuresed and is feeling some better but still has shortness of  breath with exertion.   PLAN:  To proceed with discharge after cardiology sees the patient.      Angus G. Renard Matter, MD  Electronically Signed     AGM/MEDQ  D:  10/01/2007  T:  10/01/2007  Job:  [id number]

## 2011-01-14 NOTE — Letter (Signed)
March 23, 2007    Angus G. Renard Matter, MD  [address]  [place], [place] [address]   RE:  [name], [name]  MRN:  [id number]  /  DOB:  [birth year]   Dear [name]:   [name] returns to the office for continued assessment and  treatment of nonischemic cardiomyopathy.  Since her last visit, she has  improved.  Unfortunately, she does not feel quite as well as she did  before she was hospitalized in January.  She has class III fatigue and  dyspnea on exertion.  She has no resting symptoms.  She has had no  orthopnea nor PND.  She has mild chronic edema.   CURRENT MEDICATIONS:  1. Digoxin 0.25 mg daily.  2. KCl 20 mEq b.i.d.  3. Aldactone 25 mg daily.  4. Ramipril 10 mg daily.  5. Warfarin as directed.  6. Carvedilol 25 mg b.i.d.  7. Furosemide 60 mg daily.   PHYSICAL EXAMINATION:  A pleasant woman in no acute distress.  The weight is 215, 10 pounds less than in April of this year.  Blood  pressure 135/70, heart rate 75 and regular, respirations 16.  NECK:  No jugular venous distension, normal carotid upstrokes without  bruits.  LUNGS:  Clear.  CARDIAC:  Normal 1st and 2nd heart sounds, 4th heart sound present.  ABDOMEN:  Soft and nontender, no organomegaly.  EXTREMITIES:  1+ edema.   Recent laboratory is good with potassium of 4.9, BUN of 27, and a  creatinine of 1.6 which has increased from 1.4 two months earlier.   IMPRESSION:  [name] is doing fairly well on a stable medical  regimen.  Her EKG shows a borderline IVCD with a QRS of 110 ms.  This  would not qualify for consideration of biventricular pacing.  I also  discussed with her the possible benefits of an implantable  defibrillator.  She is not interested in considering that option at the  present time.  Due to borderline hyperkalemia in the setting of  aldactone use, we will decrease potassium to 20 mEq daily.  Due to  elevated creatinine, digoxin will be reduced to 0.125 mg daily.  I will  see this nice  woman again in 4 months.  Electrolytes and renal function  will be monitored in the interim.    Sincerely,      Gerrit Friends. Dietrich Pates, MD, [facility]  Electronically Signed    RMR/MedQ  DD: 03/23/2007  DT: 03/23/2007  Job #: [id number]

## 2011-01-14 NOTE — H&P (Signed)
[name], [name] NO.:  [id number]   MEDICAL RECORD NO.:  [id number]          PATIENT TYPE:  INP   LOCATION:  2918                         FACILITY:  [facility]   PHYSICIAN:  Rollene Rotunda, MD, FACCDATE OF BIRTH:  [birth year]   DATE OF ADMISSION:  02/14/2009  DATE OF DISCHARGE:                              HISTORY & PHYSICAL   PRIMARY CARE PHYSICIAN:  Dr. [name].   REASON FOR PRESENTATION:  Evaluate the patient with a nonischemic  cardiomyopathy and cardiorenal syndrome.   HISTORY OF PRESENT ILLNESS:  The patient is 77 years old.  She has a  history of a nonischemic cardiomyopathy with an ejection fraction of  25%.  She was hospitalized earlier this month for volume overload.  She  had renal insufficiency.  She was treated dobutamine.  She had some  improvement in symptoms.  However, when she got home, there was some  confusion on her medications.  It does not appear that she was  discharged on ACE inhibitors, spironolactone or potassium.  However, she  was taking these when she came back to see [place] in the [facility].  She was also complaining of increasing fatigue.  She is tired doing  minimal activity.  She has to hold on to  things in her house just to  get around.  She does have pain in her feet from gout which is also  limiting her.  However, she has had increased dyspnea with mild  activity.  She is not describing any resting shortness of breath and has  no PND or orthopnea.  She has not been having any palpitations,  presyncope or syncope.  She is not been having any chest discomfort.  During the office visit, she was started on some prednisone for flare of  her gout.  Her meds were adjusted to include increasing her hydralazine,  restarting carvedilol which she was not taking, and getting rid of the  spironolactone and ACE inhibitor.  Labs were drawn.  These were reported  to demonstrate today a creatinine of 4.0, though I do not have these  labs.  Because of her worsening fatigue, dyspnea and renal failure, she  was asked to come to the hospital where she arrived tonight.  She is in  no distress.   PAST MEDICAL HISTORY:  Congestive heart failure, nonischemic (EF 25%.  Catheterization in 2003 with no coronary disease), severe pulmonary  hypertension, prior CVA, dyslipidemia, chronic renal insufficiency,  diabetes mellitus, chronic left bundle branch block, supraventricular  tachycardia status post ablation, mild to moderate mitral regurgitation.   PAST SURGICAL HISTORY:  Hysterectomy, status post bi-V Medtronic ICD  ([employer]).   ALLERGIES:  PPD causes a false positive.   MEDICATIONS:  Digoxin 0.25 mg daily, Coumadin, hydralazine 100 mg daily,  Lasix 80 mg daily, isosorbide 20 mg daily, famotidine 20 mg, carvedilol  6.25 mg b.i.d., propoxyphene, prednisone 10 mg daily.   SOCIAL HISTORY:  Patient is married.  She never smoked cigarettes.  She  does not drink alcohol.   FAMILY HISTORY:  Contributory for a brother having a myocardial  infarction at 56.  Her  mother had an enlarged heart.   REVIEW OF SYSTEMS:  As stated in the HPI and otherwise negative for all  other systems.   PHYSICAL EXAMINATION:  The patient is in no acute distress.  Blood pressure 129/57, respiratory rate 18 and regular, pulse 96,  afebrile.  HEENT:  Eyelids unremarkable, pupils equal, round, and reactive to  light, fundi not visualized, oral mucosa unremarkable.  NECK:  No jugular distention at 45 degrees, carotid upstroke brisk and  symmetric, no bruits, no thyromegaly.  LYMPHATICS:  No cervical, axillary or inguinal adenopathy.  LUNGS:  Clear to auscultation without wheezing or dullness percussion.  BACK:  No costovertebral angle tenderness.  CHEST:  Unremarkable.  HEART:  PMI not displaced or sustained, S1-S2 within normal limits, no  S3-S4, no clicks, no rubs, no murmurs.  ABDOMEN:  Mildly obese, positive bowel sounds, normal in frequency  and  pitch.  No bruits, rebound, guarding or midline pulsatile mass.  No  hepatomegaly, no splenomegaly.  SKIN:  No rashes, no masses.  EXTREMITIES:  2+ pulses, no edema.  NEURO:  Oriented to person, place, and time, cranial nerves II-XII  grossly intact, motor grossly intact.   EKG pending.   Labs pending.   ASSESSMENT/PLAN:  1. Acute on chronic systolic heart failure.  The patient seems to      predominately have low output.  We are going to try milrinone at a      low dose adjusted for her renal function.  We will also use low      dose diuretic though I do not think that aggressive diuresis is      indicated this evening.  Will need to check her labs.  We will need      to follow the renal function carefully.  I will continue the other      meds as listed.  2.  Renal insufficiency as above.  2. Gout.  We will use low-dose steroids given the significance of this      problem.  3. Hypertension.  Her hypertension will be managed in the context of      treating her cardiomyopathy.  4. Diabetes.  We will cover her with sliding scale insulin as needed.      Rollene Rotunda, MD, [facility]  Electronically Signed     JH/MEDQ  D:  02/14/2009  T:  02/14/2009  Job:  [id number]   cc:   Brandi G. Renard Matter, MD

## 2011-01-14 NOTE — Group Therapy Note (Signed)
NAMEEDRIE, EHRICH           ACCOUNT NO.:  [id number]   MEDICAL RECORD NO.:  [id number]          PATIENT TYPE:  INP   LOCATION:  IC04                          FACILITY:  [facility]   PHYSICIAN:  Angus G. Renard Matter, MD   DATE OF BIRTH:  [birth year]   DATE OF PROCEDURE:  DATE OF DISCHARGE:                                 PROGRESS NOTE   This patient was a direct admission from Cardiology yesterday.  This  patient has nonischemic cardiomyopathy and a history of ICD placement.  She developed shortness of breath and increasing BUN and creatinine, was  started on dobutamine drip.  According to nursing staff, she has  tolerated this well.  She does have apparently new onset UTI with  increased bacteria in urine.   OBJECTIVE:  VITAL SIGNS:  Blood pressure 121/47, pulse rate 98,  respirations 18.  LUNGS:  Diminished breath sounds.  HEART:  Regular rhythm.  ABDOMEN:  No palpable organs or masses.   ASSESSMENT:  The patient does have ischemic cardiomyopathy and  congestive heart failure, also renal failure, hyperkalemia,  hypertension.   PLAN:  To continue current regimen.      Angus G. Renard Matter, MD  Electronically Signed     AGM/MEDQ  D:  02/02/2009  T:  02/02/2009  Job:  [id number]

## 2011-01-14 NOTE — Group Therapy Note (Signed)
[name], [name]           ACCOUNT NO.:  [id number]   MEDICAL RECORD NO.:  [id number]          PATIENT TYPE:  INP   LOCATION:  IC04                          FACILITY:  [facility]   PHYSICIAN:  Angus G. Renard Matter, MD   DATE OF BIRTH:  [birth year]   DATE OF PROCEDURE:  02/06/2009  DATE OF DISCHARGE:                                 PROGRESS NOTE   This patient had a fairly comfortable night, but did not rest well.  The  dobutamine drip was discontinued.  She does have a history of ischemic  cardiomyopathy, hypertension, prior CVA, hyperlipidemia, hyperuricemia,  congestive heart failure with ejection fraction of 25%, chronic kidney  disease, and non insulin-dependent diabetes.   OBJECTIVE:  VITAL SIGNS:  Blood pressure 116/56, pulse 84, and  respirations 12.  HEART:  Regular rhythm.  No murmurs.  LUNGS:  Clear to P and A, but diminished breath sounds bilaterally.  ABDOMEN:  No palpable organs or masses.  EXTREMITIES:  Free of edema.   PERTINENT LABORATORY STUDIES:  Hemoglobin 9.8, hematocrit 27.8, BUN 36,  creatinine 2.01, and INR 2.6.   ASSESSMENT:  The patient continues to improve with her above-stated  problems.  She does have ongoing urinary tract infection, which is being  treated with antibiotic.  She will be moved to third floor when bed is  available with beginning of ambulation.      Angus G. Renard Matter, MD  Electronically Signed     AGM/MEDQ  D:  02/06/2009  T:  02/06/2009  Job:  [id number]

## 2011-01-14 NOTE — Group Therapy Note (Signed)
NAMEEULETA, BELSON           ACCOUNT NO.:  [id number]   MEDICAL RECORD NO.:  [id number]          PATIENT TYPE:  INP   LOCATION:  IC04                          FACILITY:  [facility]   PHYSICIAN:  Angus G. Renard Matter, MD   DATE OF BIRTH:  30-Jul-1933   DATE OF PROCEDURE:  DATE OF DISCHARGE:                                 PROGRESS NOTE   This patient again feels very comfortable.  She remains on dobutamine  drip.  She does have history of nonischemic cardiomyopathy,  hypertension, prior CVA, hyperlipidemia, hyperuricemia, congestive heart  failure, ejection fraction 25% chronic kidney disease, and non-insulin-  dependent diabetes.   OBJECTIVE:  VITAL SIGNS:  Blood pressure 131/53, respiration 18, pulse  91, O2 sat 97%.  HEART:  Regular rhythm.  No murmurs.  LUNGS:  Diminished breath sounds bilaterally.  No rales.  ABDOMEN:  No palpable organs or masses.  EXTREMITIES:  Free of edema.   ASSESSMENT:  The patient does have nonischemic cardiomyopathy with low  ejection fraction, prior history of cerebrovascular accident, congestive  heart failure, chronic renal failure, hyperkalemia, history of  hypertension.   Plan to continue current regimen.   PERTINENT LABORATORY DATA:  Hemoglobin 9.8, hematocrit 27.8.  Serum iron  low at 36.  Urine cultured E. coli.  The patient's creatinine 2.06 with  BUN 49.  The patient will be seen again by Cardiology today.      Angus G. Renard Matter, MD  Electronically Signed     AGM/MEDQ  D:  02/05/2009  T:  02/05/2009  Job:  [id number]

## 2011-01-14 NOTE — Discharge Summary (Signed)
[name], [name]           ACCOUNT NO.:  [id number]   MEDICAL RECORD NO.:  [id number]          PATIENT TYPE:  INP   LOCATION:  A212                          FACILITY:  [facility]   PHYSICIAN:  Angus G. Renard Matter, MD   DATE OF BIRTH:  11/10/1932   DATE OF ADMISSION:  02/01/2009  DATE OF DISCHARGE:  06/09/2010LH                               DISCHARGE SUMMARY   A 77 year old African American female admitted on February 01, 2009, and  discharged on February 07, 2009, 6 days' hospitalization.   DIAGNOSES:  1. Congestive heart failure.  2. Chronic renal failure.  3. Hyperkalemia.  4. History of hypertension.  5. Type 2 diabetes.  6. Prior history of cerebrovascular accident.  7. Urinary tract infection.  8. Cardiomyopathy, primary.   The patient's condition stable and improved at time of discharge.  This  77 year old African American female was admitted as a direct admission  to ICU because of progressive shortness of breath.  The patient was  readmitted by Dr. Dietrich Pates for dobutamine drip.  The patient does have a  history of ischemic cardiomyopathy, hypertension, prior history of CVA,  hyperlipidemia, hyperuricemia, congestive heart failure with ejection  fraction of 25%, chronic kidney disease, non-insulin dependent diabetes,  also has a moderate-to-severe mitral regurgitation.  He also has  hyperuricemia.  He recently had ICD placement.   PHYSICAL EXAMINATION:  GENERAL:  An elderly patient complaining of  shortness of breath.  VITAL SIGNS:  Blood pressure 99/66 and heart rate 104.  HEENT:  Eyes, PERRLA.  TMs negative.  Oropharynx benign.  NECK:  Supple.  Bilateral jugular venous distention.  CHEST:  Diminished breath sounds bilaterally.  No rales.  HEART:  Regular rhythm.  No murmurs.  ABDOMEN:  No palpable organs or masses.  EXTREMITIES:  1+ edema.   LABORATORY DATA:  CBC on admission:  WBC 6100 with hemoglobin 10.8,  hematocrit 31.9.  Blood gas on admission, pH of 7.350 with PCO2  of 35,  PO2 80.  Prothrombin relatively therapeutic throughout the hospital  stay.  Chemistries on admission:  Sodium 139, potassium 3.8, chloride  105, CO2 of 25, glucose 101, BUN 19, and creatinine 1.43.  Subsequent  chemistries on October 01, 2007, showed sodium 135, potassium 4.2,  chloride 104, CO2 25, glucose 94, BUN 34, and creatinine 1.20.  CPK on  admission 122, CPK-MB 2, troponin 0.08.  Serum iron 59, iron-binding  capacity 248.   X-Rays:  Bilateral carotid Doppler ultrasound showed mild extensive  bifurcation plaque without hemodynamically significant stenosis.  Electrocardiogram on admission:  Sinus rhythm with premature atrial  complexes, T-wave abnormality, and inferolateral ischemia.  A 2-D  echocardiogram showed severe impaired ejection fraction of 0.20 to 0.25.  Comparison with prior study, September 07, 2008, no significant interval  change.   HOSPITAL COURSE:  The patient at the time of her admission was placed on  2 g sodium diet, 80 mg intravenously was given daily.  She was continued  on Coumadin 4 mg daily, carvedilol 6.25 mg b.i.d., Pepcid 20 mg b.i.d.,  Darvocet-N 100 every 6 hours p.r.n.  Dobutamine drip started at 5  mcg/kg/minute.  The patient was seen in consultation by Dr. Kristian Covey as  well as Dr. Dietrich Pates.  Foley catheter was inserted.  Her dobutamine drip  was adjusted 3 mcg/kg/minute on February 02, 2009.  She was at that time  placed on hydralazine 10 mg t.i.d., isosorbide mononitrate 30 mg daily,  Coumadin dosage was adjusted.  The patient was noted to have urinary  tract infection.  On February 14, 2009, placed on Macrobid capsules 100 mg  b.i.d. subsequently changed to Levaquin 250 mg a day.  Macrobid was  discontinued.  On February 05, 2009, the patient's Lasix was adjusted to 80  mg t.i.d.  Coumadin was adjusted.  The patient remained in ICU until  February 05, 2009, and transferred to medical floor on telemetry.  He was  placed on Imdur 120 mg daily, hydralazine 50 mg  t.i.d., digoxin 0.125 mg  daily.  The patient showed progressive improvement during hospital stay.  Initially she was quite dyspneic.  She was able to be discharged from  the hospital on the sixth hospital day.   DISCHARGE MEDICATIONS:  She was discharged on following medications:  1. Coumadin 2.5 mg daily.  2. Ramipril 5 mg daily.  3. Spironolactone 25 mg b.i.d.  4. Lasix 80 mg daily.  5. Famotidine 20 mg b.i.d.  6. Potassium ER 20 mEq daily.  7. Digoxin 0.125 mg daily except none on Saturday and Sunday.  8. Imdur 120 mg daily.  9. Hydralazine 50 mg t.i.d.  10.Lasix 80 mg b.i.d.  11.Carvedilol 3.125 mg b.i.d.  12.Levaquin 250 mg daily.   CONDITION ON DISCHARGE:  The patient was stable and improved at time of  discharge.      Angus G. Renard Matter, MD  Electronically Signed     AGM/MEDQ  D:  03/23/2009  T:  03/23/2009  Job:  [id number]

## 2011-01-14 NOTE — Discharge Summary (Signed)
[name] NO.:  [id number]   MEDICAL RECORD NO.:  [id number]          PATIENT TYPE:  INP   LOCATION:  2034                         FACILITY:  [facility]   PHYSICIAN:  Doylene Canning. Ladona Ridgel, MD    DATE OF BIRTH:  04/02/1933   DATE OF ADMISSION:  11/07/2008  DATE OF DISCHARGE:                               DISCHARGE SUMMARY   This patient presents with an allergy to a TB test.   Time for this dictation and examination and explanation to the patient  greater than 40 minutes.   FINAL DIAGNOSES:  1. Admitted with inappropriate cardioverter-defibrillator discharges      x5 in the setting of atrial tachycardia/supraventricular      tachycardia.  2. Discharging day #2 status post:      a.     Electrophysiology study.      b.     Unable to induce supraventricular tachycardia in the       electrophysiology lab.      c.     Atrioventricular node ablation.   SECONDARY DIAGNOSES:  1. Biventricular implantable cardioverter-defibrillator ([employer]tronic      [place]).      a.     Implanted on Jan 03, 2008, for ejection fraction of 15%/left       bundle-branch block/New York Heart Association class III       congestive heart failure.      b.     Postprocedure left upper extremity deep vein thrombosis.  2. Coumadin, chronic for history of cerebrovascular accident, left      upper extremity deep vein thrombosis post implantable cardioverter-      defibrillator implant.  3. Left heart catheterization on September 07, 2001, ejection fraction 15-      20%, nonischemic cardiomyopathy, normal coronary anatomy, severe      pulmonary hypertension.  4. Elevated creatinine this admission.  Her Altace has been placed on      hold until she sees physician assistant in 2 weeks followup.  5. Hypertension.  6. History of cerebrovascular accident.  7. Hospitalized in January 2009 with acute on chronic congestive heart      failure/pulmonary edema.  8. Status post hysterectomy/bilateral  salpingo-oophorectomy/cataract      surgeries.   PROCEDURES THIS ADMISSION:  1. Interrogation of her ICD demonstrating inappropriate discharges in      the setting of atrial tachycardia.  2. On November 09, 2008, electrophysiology study.  No inducible SVT in a      patient with prior recurrent A-tach, successful AV node ablation by      Dr. Ladona Ridgel.  The patient has done well with respect to cardiac      arrhythmias.  She has had no recurrence of SVT.  She is in sinus      rhythm with PACs at the time of discharge.  During this admission,      she has had steadily rising creatinine on admission.  Her      creatinine on November 06, 2008, was 1.18.  At the time of discharge,      it was almost to 1.95.  For this reason, Altace has been placed on      hold until a followup basic metabolic panel can be obtained at the      office of [facility], [place], that would be on      Thursday, November 23, 2008, at 11:20 in the morning.   MEDICATIONS AT DISCHARGE:  1. Hold Altace 10 mg daily until she sees the [place] office.  2. Digoxin 0.25 mg daily.  3. Coreg 25 mg twice daily.  4. Aldactone 25 mg daily.  5. Lasix 80 mg daily.  6. Potassium chloride 20 mEq daily.  This may be adjusted upward as      the creatinine level improves.  This can should also be checked at      the office visit on November 23, 2008.  7. Coumadin 4 mg daily.  She will have this checked with Dr. Renard [name]'      office.  8. Darvocet- N 100 one to two tabs every 4-6 hours as needed for pain.   FOLLOWUP APPOINTMENTS:  1. Dr. Renard [name], Wednesday, November 15, 2008, at 9:15 for protime check.  2. To see Mr. Barb Merino [facility], Thursday,      November 23, 2008, at 11:20.  3. To see Dr. Ladona Ridgel, [facility], Wednesday,      December 13, 2008, at 4 o'clock.   Once again, basic metabolic panel taken at the Thursday, November 23, 2008,  office visit to check both creatinine and  potassium.   LABORATORY STUDIES THIS ADMISSION:  On the day of discharge, INR is  still subtherapeutic.  Her Coumadin had been held for a 48-hour period  this admission.  Serum electrolytes, sodium 135, potassium 4.4, chloride  99, carbonate 24, the BUN is 39, creatinine 1.95.  TSH 2.701 this  admission.  The BNP this admission was 168.  Troponin I studies were  0.26 and 0.09.  Complete blood count on admission white cells 7.2,  hemoglobin 10.2, hematocrit is 30, the platelets are 226.  Magnesium  this admission is 1.9.      Maple Mirza, PA      Doylene Canning. Ladona Ridgel, MD  Electronically Signed    GM/MEDQ  D:  11/10/2008  T:  11/11/2008  Job:  [id number]   cc:   Brandi Friends. Dietrich Pates, MD, [facility]  Brandi G. Renard Matter, MD

## 2011-01-14 NOTE — H&P (Signed)
NAMESYVANNA, CIOLINO NO.:  [id number]   MEDICAL RECORD NO.:  [id number]          PATIENT TYPE:  INP   LOCATION:  2924                         FACILITY:  [facility]   PHYSICIAN:  Rollene Rotunda, MD, FACCDATE OF BIRTH:  October 05, 1932   DATE OF ADMISSION:  11/07/2008  DATE OF DISCHARGE:                              HISTORY & PHYSICAL   PRIMARY:  Angus G. Renard Matter, MD.   CARDIOLOGIST:  Gerrit Friends. Dietrich Pates, MD, [facility].   REASON FOR PRESENTATION:  Evaluate a patient with cardiomyopathy and a  firing of her defibrillator.   HISTORY OF PRESENT ILLNESS:  The patient is very pleasant 77 year old  African American female with a past history of a nonischemic  cardiomyopathy.  She has had class III heart failure symptoms.  She had  an ICD/CRT Medtronic placed by Dr. Ladona Ridgel in May of 2009.  She  apparently had no firings since that time until now.  She said she was  at church on Sunday.  She was relaxed and not feeling poorly.  She felt  her defibrillator fire.  It was a sudden significant jolt that her yell.  She went home and did not seek any medical attention.  She was not  having any palpitations.  She was not having any chest pain and had no  exacerbations of her congestive heart failure.  She did well until this  evening.  Again, she was relaxed.  She went to visit a friend and then  was walking to her car not particularly stressed when her ICD fired four  times in a row.  She felt it shocking her.  She felt weak afterwards.  She had some slight chest pressure afterwards.  However, prior to this  she was not having any dyspnea.  She was not noticing any palpitations.  Should have any presyncope or syncope.  She had some lower extremity  swelling, but no significant weight gain.  She has had no new PND or  orthopnea.  She has otherwise had a reasonable exercise tolerance.  She  went to [facility] where she had no acute EKG changes and was  found to be in sinus tach with  ventricular pacing.  She had a normal  chemistry panel.  She is now transferred for further evaluation.   PAST MEDICAL HISTORY:  1. Nonischemic cardiomyopathy (EF of about 25%).  2. Normal coronary arteries by catheterization in 2003.  3. Hypertension.  4. CVA with left-sided weakness.  5. Cataracts.   PAST SURGICAL HISTORY:  Abdominal hysterectomy and BSO.   ALLERGIES/INTOLERANCES:  SHE HAS ABNORMAL SKIN REACTION TO PPD, FALSE-  POSITIVE.   MEDICATIONS:  Per the last clinic note:  1. Aldactone 25 mg daily.  2. Ramipril 10 mg daily.  3. Potassium 20 mEq b.i.d.  4. Carvedilol 25 mg b.i.d.  5. Lasix 80 mg daily.  6. Digoxin 0.25 mg daily.  7. Coumadin.   SOCIAL HISTORY:  The patient is married.  She had four living children.  Unfortunately, she had a son die suddenly of a myocardial infarction 9  months ago at age 36.  She has not  really smoked cigarettes.  She does  not drink alcohol.   FAMILY HISTORY:  Positive for her son as above.  Her mother died at  later age of heart disease.   REVIEW OF SYSTEMS:  As stated in the HPI, negative for all other  systems.   PHYSICAL EXAMINATION:  The patient is pleasant and in no distress.  Blood pressure 150/77, respiratory rate 16, afebrile, heart rate 74,  100% saturation on 2 L.  HEENT:  Eyes unremarkable, pupils equal, round and reactive to light,  fundi not visualized, oral mucosa unremarkable.  NECK:  No jugular distention at 45 degrees, carotid upstroke brisk and  symmetric, no bruits, no thyromegaly.  LYMPHATICS:  No cervical, axillary or inguinal adenopathy.  LUNGS:  Clear to auscultation bilaterally.  BACK:  No costovertebral angle tenderness.  CHEST:  Unremarkable.  Well-healed ICD pocket.  HEART:  PMI is somewhat broadly sustained, S1 and S2 within normal  limits.  No S3, no S4, a 2/6 apical systolic murmur, holosystolic, no  diastolic murmurs.  ABDOMEN:  Mildly obese, positive bowel sounds normal in frequency and   pitch, no bruits, no rebound, no guarding, no midline pulsatile mass, no  hepatomegaly, no splenomegaly.  SKIN:  No rashes, no nodules.  EXTREMITIES:  2+ pulses throughout, mild bilateral ankle edema, no  cyanosis or clubbing.  NEURO:  Oriented to person, place and time, cranial nerves II-XII  grossly intact, motor grossly intact.   EKG:  Sinus tachycardia, ventricular pacing, she does appear to have  left ventricle hypertrophy with repolarization changes, this no change  from previous EKGs.  Chest x-ray:  No acute cardiopulmonary findings.   LABS:  Sodium 138, potassium 3.8, BUN 13, creatinine 1.18, calcium 8.9.   ASSESSMENT/PLAN:  1. Implantable cardioverter-defibrillator firing.  I do not see an      arrhythmia so far on telemetry.  There was none recorded in the      emergency room.  She is not having any palpitations.  We will have      the device interrogated.  I will check a TSH and electrolytes.      Further evaluation will be based on the findings with      interrogation.  2. Cardiomyopathy.  She seems to have class II symptoms at most.  I am      going to continue the meds as listed.  I will check a BNP level.  3. Cerebrovascular accident.  The patient is on chronic Coumadin      therapy because of this.  This will be continued.  4. Other.  I will rule out myocardial infarction with serial enzymes.  5. Hypertension.  Blood pressure is slightly elevated, but I will      continue the medications as listed and perhaps titrate her beta-      blocker as her target based on weight would be 50 mg twice a day.      Rollene Rotunda, MD, [facility]  Electronically Signed     JH/MEDQ  D:  11/07/2008  T:  11/07/2008  Job:  [id number]   cc:   Angus G. Renard Matter, MD

## 2011-01-14 NOTE — Discharge Summary (Signed)
NAMEJENNIFER, [name] NO.:  [id number]   MEDICAL RECORD NO.:  [id number]          PATIENT TYPE:  INP   LOCATION:  2029                         FACILITY:  [facility]   PHYSICIAN:  Rollene Rotunda, MD, FACCDATE OF BIRTH:  1932-12-03   DATE OF ADMISSION:  02/14/2009  DATE OF DISCHARGE:  02/22/2009                               DISCHARGE SUMMARY   PROCEDURES:  1. Right heart catheterization.  2. A 2-D echocardiogram.   PRIMARY FINAL DISCHARGE DIAGNOSIS:  Cardiorenal syndrome.   SECONDARY DIAGNOSES:  1. History of nonischemic cardiomyopathy with an ejection fraction of      25% and no coronary artery disease by catheterization in 2003.  2. Echocardiogram this admission showing an ejection fraction of 60-      65% with basal posterior hypokinesis and mild left ventricular      hypertrophy.  3. Gout.  4. History of pulmonary hypertension with right heart catheterization      this admission showing pulmonary artery pressures of 29/15 and a      mean of 20, wedge of 10.  5. Chronic dyspnea on exertion.  6. Left bundle-branch block.  7. Remote history of cerebrovascular accident.  8. Dyslipidemia.  9. Chronic kidney disease, stage III.  10.History of supraventricular tachycardia status post ablation.  11.Mild to moderate mitral regurgitation.  12.Status post Medtronic biventricular implantable cardioverter-      defibrillator.  13.Diabetes.  14.Status post hysterectomy.  15.Family history of coronary artery disease.   TIME AT DISCHARGE:  48 minutes.   HOSPITAL COURSE:  [name] is a 77 year old female with a history of  heart failure.  She was hospitalized at [facility] for heart failure and  was on a dobutamine drip.  She was discharged on February 07, 2009, saw Dr.  Dietrich Pates on February 12, 2009.  Labs were drawn, and the creatinine was 4.0.  She was asked to come to the hospital and came in on February 14, 2009, and  was admitted.   She was hydrated gently, and her volume  status was followed closely.  Initially, her diuretics were held but at discharge, they are to be  restarted at a low dose.  At discharge, her weight is 100.1 kg, and her  volume status is stable.   As her volume status improved, her renal function improved also.  At  discharge, BUN 22, creatinine 1.44, and a GFR of 43.  On admission, her  BUN was 91 with a creatinine of 4.83, and a GFR of 11.   [name] is also noted to be anemic.  This is not new but slightly  worse than usual with a hemoglobin of 8.6 and hematocrit of 25.6 at  discharge.  She had labs while at [facility], which showed an iron level  slightly low at 36.  She will be started on a multivitamin with iron and  given stool guaiac cards at discharge.  She is to follow up with Dr.  Renard Matter for this.   Her Coumadin level was followed closely, and her INR is therapeutic at  discharge.  She is to follow up with  Dr. Renard Matter for a Coumadin check  next week on Monday or Tuesday.   [name] had been dealing with a gout flare prior to admission, but  this has greatly improved.  She was seen by PT and OT and will follow up  with them after discharge for deconditioning.   An echocardiogram showed a greatly improved EF.  Chest x-ray showed no  acute disease.  By February 22, 2009, [name] was felt to be at  baseline regarding her respirations.  Dr. Antoine Poche felt that she could  be safely discharged home in improved condition with close outpatient  followup.   DISCHARGE INSTRUCTIONS:  1. Her activity level is to be as tolerated.  2. She is to stick to a low-sodium heart-healthy low-carbohydrate      diet.  3. She is to follow up with Dr. Dietrich Pates on February 27, 2009, at 11:15.      She is to follow up with Dr. Renard Matter and get a Coumadin check on      Monday.   DISCHARGE MEDICATIONS:  1. Lasix 20 mg a day.  2. Coreg 6.25 mg b.i.d.  3. Hydralazine 10 mg 3 times a day.  4. Imdur 30 mg once a day.  5. Protonix 40 mg a  day.  6. Coumadin 4 mg daily.      Theodore Demark, PA-C      Rollene Rotunda, MD, [facility]  Electronically Signed    RB/MEDQ  D:  02/22/2009  T:  02/22/2009  Job:  [id number]   cc:   Angus G. Renard Matter, MD

## 2011-01-14 NOTE — Consult Note (Signed)
NAMEALTHA, SWEITZER           ACCOUNT NO.:  [id number]   MEDICAL RECORD NO.:  [id number]          PATIENT TYPE:  INP   LOCATION:  IC04                          FACILITY:  [facility]   PHYSICIAN:  Jorja Loa, M.D.DATE OF BIRTH:  Mar 04, 1933   DATE OF CONSULTATION:  DATE OF DISCHARGE:                                 CONSULTATION   REASON FOR CONSULTATION:  Worsening of renal failure.   Ms. Lavergne is a 77 year old African American with past medical history  of nonischemic cardiomyopathy with New York Heart Association stage III  and history of ICD placement, presently was brought to the hospital  because of shortness of breath and also increase in BUN and creatinine.  According to Mr. Krukowski, she has been in the hospital recently in  March at [facility] where she stayed for approximately about a  week, and during that time she was admitted because of __________ CHF,  but during that time also she was having a problem with the AICD.  Since  she was discharged from the hospital, according to her, her leg swelling  has been improving but still she has exertional dyspnea and also  orthopnea.  She uses lot of sleep pillows since her discharge.  Presently, she is admitted as stated above  because her BUN and  creatinine seems to be increasing.  Ms. Rosa denies any previous  history of kidney stone, history of renal failure even though there is a  documentation of renal failure.  The only thing she mentioned is she has  history of hypertension but no diabetes.   PAST MEDICAL HISTORY:  Nonischemic cardiomyopathy with ejection fraction  of 25%, left bundle-branch block, history of hypertension, history of  gout with hyperuricemia, history of CVA with left-sided weakness,  history of cataracts, history of CHF with NYHA classification stage III.   SURGICAL HISTORY:  She has history of hysterectomy, history of AV node  ablation, history of biventricular ICD placement.   SOCIAL HISTORY:  No history of smoking.  No history of alcohol abuse.   FAMILY HISTORY:  She has a strong family history of coronary artery  disease.  Mother died of an MI and also one of her son died at the age  of 31 from MI.  However, there is no history of kidney stone.  There is  a history of kidney problem.  One of her sisters had kidney transplant,  but the etiology is not clear.   Her medications at this moment consist of:  1. Coreg 6.25 mg p.o. b.i.d.  2. Pepcid 20 mg p.o. daily.  3. Lasix 80 mg IV.  4. She is also getting dobutamine at __________ mcg.  5. Darvocet on a p.r.n. basis.   Her medications, I have her as outpatient, consist of of:  1. Ramipril 5 mg p.o. daily.  2. She is also on potassium 20 mEq p.o. daily.  3. She was also on Coumadin and Aldactone 25 mg twice a day.  That was      the medication she was seen in April 14 until admission.   REVIEW OF SYSTEMS:  She  has some nausea, the last couple of days,  especially in the morning.  She has also some sour taste in her mouth,  otherwise no vomiting.  She has shortness of breath and orthopnea.  She  used sleep pillows, occasionally also exertional dyspnea.  Rarely also  paroxysmal nocturnal dyspnea.  She denies any chest pain.  She denies  any fever, chills, or sweating.  She denies any abdominal pain or  diarrhea.  She states that she is going to the bathroom more frequently.  She has swelling of her legs but seems to be getting better, and also  she has recent recurrence so far.   PHYSICAL EXAMINATION:  GENERAL:  The patient at this moment seems to be  alert.  VITAL SIGNS:  Heart rate of 104, blood pressure is 99/66.  HEENT:  No conjunctival pallor.  No icterus.  Oral mucosa seems to be  dry.  NECK:  Supple.  She has some JVD.  CHEST:  Decreased breath sounds.  Otherwise, no rales, no egophony.  HEART:  Regular rate and rhythm, seems to be somewhat tachycardic.  No  S3.  ABDOMEN:  Soft.  Positive bowel  sounds.  Nontender.  EXTREMITIES:  She has trace 1+ edema.   BLOOD WORK:  Her sodium is 174.  Her potassium is 5.3, CO2 20, BUN 83,  creatinine is 3.5.  In November 15, 2008, BUN was 43, creatinine 1.3.  Her  PT is 33.7, INR of 3, PTT is 81.  White blood cell count is 9,  hemoglobin 10.8, hematocrit 30.9.  Her albumin is 3.7, calcium 8.9,  phosphorus 3.8, magnesium 2.3.  BNP is 30.  She had uric acid which was  done during her last admission on October 15, 2008, which was 12.7.  Chest x-ray during this admission stable __________ enlargement of  cardiac silhouette.  No acute pulmonary process is noted.   ASSESSMENT:  1. Renal failure.  At this moment, probably acute on chronic.  The      most likely etiology at this moment seems to be possibly prerenal      syndrome either from decreased cardiac output versus from      dehydration secondary to use of diuretics leading to intravascular      volume depletion.  However, since the patient is also on ACE      inhibitor that may contribute to renal failure.  Since the patient      has elevated creatinine from before, at this moment cannot rule out      possibility of underlying chronic renal failure.  2. Hyperkalemia, possibly combination of worsening of renal failure,      ACE inhibitor and also Aldactone and potassium supplements all can      contribute to elevation of potassium.  3. History of hypertension.  Blood pressure seems to be controlled      very well.  4. Nonischemic cardiomyopathy with low ejection fraction.  Presently,      she is started on dobutamine.  5. History of cerebrovascular accident with left-sided weakness.  6. History of cataracts.  7. History of recurrent congestive heart failure on dialysis.      Presently, she does not seem to have significant sign of fluid      overload __________ also she has been on diuretics.  8. History of gout, recurrent problem.  9. History of hyperuricemia.  Her uric acid last time was  about 12.3.      She is not  on any other medication at this moment.  10.History of left bundle-branch block.  She has history of      atrioventricular node ablation status post automatic implantable      cardioverter-defibrillator placement.  11.History of anemia, mild, probably iron deficiency.   RECOMMENDATIONS:  I agree with dobutamine.  Probably, we will start her  on a small amount of fluids of normal saline at 75 mL per hour to  improve her current urine output and possibly also to help her with  perfusing her kidneys with dobutamine.  We will leave her on Lasix.  I  agree with discontinuation of Aldactone and Altace.  We  will check her UA.  We will do ultrasound of the kidneys to evaluate  kidney size.  We will continue other treatment, and once her renal  function improve, probably the patient may need to go back to ACE  inhibitor to improve her cardiac output.       Jorja Loa, M.D.  Electronically Signed     BB/MEDQ  D:  02/01/2009  T:  02/02/2009  Job:  [id number]

## 2011-01-14 NOTE — Procedures (Signed)
[name], [name]           ACCOUNT NO.:  [id number]   MEDICAL RECORD NO.:  [id number]          PATIENT TYPE:  INP   LOCATION:  A211                          FACILITY:  [facility]   PHYSICIAN:  Gerrit Friends. Dietrich Pates, MD, FACCDATE OF BIRTH:  [birth year]   DATE OF PROCEDURE:  09/28/2007  DATE OF DISCHARGE:                                ECHOCARDIOGRAM   REFERRING:  Dr. Renard Matter and Dr. Dietrich Pates.   CLINICAL DATA:  A 77 year old woman with cardiomyopathy and recurrent  congestive heart failure.   1. Technically adequate echocardiographic study.  2. Moderate left atrial enlargement; right atrial size at the upper      limit of normal.  3. Normal right ventricular size; no RVH; mildly impaired RV systolic      function.  4. Normal diameter of the proximal ascending aorta; mild to moderate      calcification of the wall and annulus.  5. Mild sclerosis of a trileaflet aortic valve; there appears to be      mild aortic valve prolapse without insufficiency.  6. Normal pulmonic valve and proximal pulmonary artery.  7. Mild mitral valve thickening; mild to moderate regurgitation; mild      annular calcification.  8. Normal tricuspid valve.  9. Mild left ventricular dilatation; mild to moderate hypertrophy;      there is severe global hypokinesis with virtual akinesis of the      inferoseptal segment.  The inferolateral region of the base      demonstrates near normal contractility.  Overall LV systolic      function is severely impaired with an estimated ejection fraction      of 0.20-0.25.  10.Normal IVC.  11.Comparison with prior study of September 07, 2006:  No significant      interval change.      Gerrit Friends. Dietrich Pates, MD, [facility]  Electronically Signed     RMR/MEDQ  D:  09/29/2007  T:  09/30/2007  Job:  [id number]

## 2011-01-14 NOTE — Group Therapy Note (Signed)
NAMEAMIE, COWENS           ACCOUNT NO.:  [id number]   MEDICAL RECORD NO.:  [id number]           PATIENT TYPE:  INP   LOCATION:  A211                          FACILITY:  [facility]   PHYSICIAN:  Angus G. Renard Matter, MD   DATE OF BIRTH:  April 08, 1933   DATE OF PROCEDURE:  DATE OF DISCHARGE:                                 PROGRESS NOTE   This patient was admitted yesterday with shortness of breath which had  been present for approximately 1 week and had been becoming  progressively worse. Chest x-ray showed evidence of pulmonary venous  hypertension and wet pleural fissures compatible with incipient CHF.  She does have COPD and emphysema as well. She has started to diurese.   OBJECTIVE:  VITAL SIGNS:  Blood pressure 127/66, respiration 16, pulse  85, temp 97.8, INR 3.1.  Chemistries essentially normal with exception  of creatinine 1.43. A BNP 240. Cardiac markers normal.  HEART:  Regular rhythm.  LUNGS:  Diminished breath sounds over lower lung field.  ABDOMEN:  No palpable organs or masses.   ASSESSMENT:  The patient was admitted with a history of nonischemic  cardiomyopathy and congestive heart failure. She has a previous history  of CVA with chronic anticoagulation.   PLAN:  To continue current regimen.  Will obtain cardiology consult. The  patient does have negative fluid balance of 1000.      Angus G. Renard Matter, MD  Electronically Signed     AGM/MEDQ  D:  09/28/2007  T:  09/28/2007  Job:  [id number]

## 2011-01-14 NOTE — Group Therapy Note (Signed)
[name], DRINKARD           ACCOUNT NO.:  [id number]   MEDICAL RECORD NO.:  [id number]          PATIENT TYPE:  INP   LOCATION:  IC04                          FACILITY:  [facility]   PHYSICIAN:  Angus G. Renard Matter, MD   DATE OF BIRTH:  03-03-1933   DATE OF PROCEDURE:  02/04/2009  DATE OF DISCHARGE:                                 PROGRESS NOTE   This patient appears to remain fairly comfortable.  She remains on  dobutamine drip.  She does have E. coli, which was cultured from urine.  She does have a history of nonischemic cardiomyopathy, hypertension,  prior CVA, hyperlipidemia hyperuricemia, congestive heart failure with  an ejection fraction of 25%, chronic kidney disease, non-insulin-  dependent diabetes.   PHYSICAL EXAMINATION:  VITAL SIGNS:  Blood pressure 117/59, pulse rate  89, 96% O2 saturation.  HEART:  Regular rhythm.  LUNGS:  Diminished breath sounds bilaterally.  No rales.  ABDOMEN:  No palpable organs or masses.  EXTREMITIES:  Free of edema.   ASSESSMENT:  The patient has nonischemic cardiomyopathy with low  ejection fraction, prior history of cerebrovascular accident, congestive  heart failure, chronic renal failure, hyperlipidemia, history of  hypertension, does have urinary tract infection, Escherichia coli  cultured.   PLAN:  To continue dobutamine drip.  Continue current meds.  Add  Macrobid to the regimen.  Continue to monitor.      Angus G. Renard Matter, MD  Electronically Signed     AGM/MEDQ  D:  02/04/2009  T:  02/05/2009  Job:  [id number]

## 2011-01-14 NOTE — Letter (Signed)
March 30, 2009    Angus G. Renard Matter, MD  [address]  [place], [place] [address]   RE:  [name], [name]  MRN:  [id number]  /  DOB:  1933-08-15   Dear Brandi Party,   [name] returns to the office for continued assessment and  treatment of cardiomyopathy and congestive heart failure.  Since her  last visit, she has done fairly well.  Her gouty arthritis has resolved.  She took a screen medicine for a while, but discontinued it due to  abdominal discomfort.  It is unclear whether this was methylprednisolone  or digoxin, neither of which she is taking at present.  She has had some  dyspnea on exertion, but has been able to walk in the hallways in the  hospital with a walker and to cook at home without too much trouble.  Blood pressure control has been good.  She has a followup appointment  with Dr. Ladona Ridgel in the near future to reassess her AICD and  biventricular pacing.  Her renal function has been stable, most recently  with a creatinine of 1.4.   Her echocardiogram was read as normal at [facility].  This  study was reviewed.  She was being infused with dobutamine at that time,  and left ventricular systolic function was substantially improved, but  not normal to my assessment.  Ejection fraction was probably 45% or so.   CURRENT MEDICATIONS:  Include, warfarin as directed, hydralazine 50 mg  t.i.d., furosemide 40 mg daily, carvedilol 12.5 mg b.i.d., isosorbide  mononitrate 60 mg daily, famotidine 20 mg b.i.d., and a multivitamin.   PHYSICAL EXAMINATION:  GENERAL:  Pleasant woman in no acute distress.  VITAL SIGNS:  The weight is 203, eight pounds increased since last  month.  Blood pressure 120/75, heart rate 95 and somewhat irregular,  respirations 12 and unlabored.  NECK:  No jugular venous distention; no carotid bruits.  LUNGS:  Clear with decreased breath sounds at the bases.  CARDIAC:  Normal first heart sound; accentuated second heart sound.  Minimal  systolic ejection murmur.  ABDOMEN:  Soft and nontender.  EXTREMITIES:  1/2+ ankle edema.  RHYTHM STRIP:  Sinus rhythm with atrial synchronous ventricular pacing;  occasional PAC and PVC.   IMPRESSION:  [name] continues to have severe cardiomyopathy.  She  likely will not be compensated with her current medications.  Hydralazine will be increased to 100 mg t.i.d., furosemide to 60 mg  daily, carvedilol to 25 mg b.i.d. and isosorbide mononitrate to 120 mg  daily.  Digoxin will be at dose of 0.125 mg daily.  We will continue to  monitor chemistry profile and the patient closely.  I will see her again  in 2-3 weeks.    Sincerely,      Gerrit Friends. Dietrich Pates, MD, [facility]  Electronically Signed    RMR/MedQ  DD: 03/30/2009  DT: 03/31/2009  Job #: [id number]

## 2011-01-14 NOTE — Discharge Summary (Signed)
NAMEANQUANETTE, BAHNER NO.:  [id number]   MEDICAL RECORD NO.:  [id number]          PATIENT TYPE:  INP   LOCATION:  2034                         FACILITY:  [facility]   PHYSICIAN:  Doylene Canning. Ladona Ridgel, MD    DATE OF BIRTH:  1932/11/23   DATE OF ADMISSION:  11/07/2008  DATE OF DISCHARGE:  11/15/2008                               DISCHARGE SUMMARY   ADDENDUM   This is an addendum to the discharge summary, which was dictated on  November 10, 2008, covering her extended hospital stay from November 11, 2008  to November 15, 2008.  The patient has an allergy to the TB TEST.  Time for  this dictation, examination, and explanation to the patient is greater  than 45 minutes.   FINAL DIAGNOSES:  1. Admitted with inappropriate implantable cardioverter-defibrillator      discharges.  There were 5.  She had atrial      tachycardia/supraventricular tachycardia.  2. Discharging day #7 status post:      a.     Electrophysiology study.      b.     Unable to induce supraventricular tachycardia in the       electrophysiology lab.      c.     Atrioventricular node ablation.      d.     Elevation in creatinine this admission.  Zenith creatinine       was 2.25 on November 11, 2008.  Creatinine at the time of discharge       1.36.  For the last 5 days, the patient's Lasix, potassium,       Aldactone have all been placed on hold as well as her Altace.  3. Left ankle arthritic flare.  Uric acid was 12.7 this admission, but      it was thought that this was probably a monoarticular arthritic      flare.  The patient has received a prednisone taper starting at 50      mg tapering down to final doses of 10 mg, which will stop on Sunday      November 19, 2008.  The patient is discharging on Altace reduced from      10  mg to 2.5 mg daily.   BRIEF HOSPITAL COURSE:  Beginning with November 11, 2008, the patient awoke  with left foot and ankle pain.  It was difficult for her to stand and  walk.  She was unable to  bear weight on it.  There was no overt swelling  or erythema.  No history of trauma and no prior history of gout new.   Nevertheless, uric acid was obtained which was 12.7 and she was started  on prednisone for possible flare of either gout or severe monoarticular  arthritis starting at 50 mg daily and tapering down by 10 mg daily.  Over the next 4 days, her ankle pain grew successively less intense.  By  November 14, 2008, the day prior to her discharge she was able to gingerly  and nimbly walk about with a limp.  At the same time, her creatinine  started normalizing from  a high of 2.25 on November 11, 2008, each day  becoming more like her admission creatinine which was 1.18.  To help  things along her Lasix and potassium were held as well as the Altace.  By November 14, 2008, the patient was able to walk with assistance and a  rolling walker, but freely in the hospital room.  By November 15, 2008,  with the help of cardiac rehab.  The patient was able to ambulate about  110 feet.  She did require rolling walker and assist x2.  Her gait was  fairly steady.  She is still walking with a limp, but she says that she  feels much better and would be ready to go home.  She will have home  health and home physical therapy.  They will also draw a blood for blood  work and as followup labs.  On the day of discharge, the sodium was 136,  potassium 5.2, chloride 107, carbonate 22, BUN is 43, creatinine 1.36  and glucose 96, protime 32.9, INR 2.9.  The patient is discharging on  the following medications.  These are adjusted from those medications in  the discharge summary.  1. A new medication, prednisone 10 mg tablets.  She takes 2 tablets on      Thursday November 16, 2008, 1 tablet on Friday, Saturday, and Sunday      and then is to stop.  2. Ramipril a new dose 2.5 mg daily.  It was 10 mg daily.  3. Restart Lasix 80 mg daily.  4. Digoxin 0.25 mg daily.  5. Coreg 25 mg twice daily.  6. Potassium chloride.   She is to hold that Thursday on March 18,      20 10, and to restart potassium chloride 20 mEq just 1 tablet daily,      Friday November 17, 2008.  This will also have her Lasix 80 mg daily      and should keep her potassium under good control.  7. Coumadin 4 mg daily.  8. She is to stop taking Aldactone for now.  She is to stop it.  9. Darvocet-N 100 1-2 tablets every 4-6 hours as needed for pain in      the left ankle with home health.  She will have blood drawn on      Friday November 17, 2008, for protime.   PLAN:  1. The results to Dr. Megan Mans.  2. Lab work on Wednesday November 22, 2008, the CBC and a basic metabolic      panel.  The results to [facility] office.  3. She will see Mr. Alben Spittle, physician assistant for Dr. Dietrich Pates,      [place] Thursday November 23, 2008 at 3:40.  4. See Dr. Ladona Ridgel in [place] office of [facility] Wednesday      on December 13, 2008, at 4 o'clock.  5. The patient's last CBC was November 14, 2008, white cells 10.9,      hemoglobin 10.9, hematocrit 33, platelets 251.      Maple Mirza, PA      Doylene Canning. Ladona Ridgel, MD  Electronically Signed    GM/MEDQ  D:  11/15/2008  T:  11/16/2008  Job:  [id number]   cc:   Angus G. Renard Matter, MD  Gerrit Friends. Dietrich Pates, MD, [facility]  Doylene Canning. Ladona Ridgel, MD

## 2011-01-16 ENCOUNTER — Other Ambulatory Visit: Payer: Self-pay | Admitting: *Deleted

## 2011-01-16 MED ORDER — POTASSIUM CHLORIDE CRYS ER 20 MEQ PO TBCR: EXTENDED_RELEASE_TABLET | ORAL | Status: DC

## 2011-01-17 NOTE — Letter (Signed)
December 21, 2006    Angus G. Renard Matter, MD  [address]  [place], [place] [address]   RE:  [name], [name]  MRN:  [id number]  /  DOB:  1933-01-20   Dear [name]:   Mrs. Shaheen returns to the office for continued assessment and  treatment of cardiomyopathy.  Since her last visit, she has not felt  that well.  She notes dyspnea with very mild exertion.  She has not had  orthopnea or PND.  She notes no chest pain.   In attempting to review her medications, it is difficult to determine  exactly what she is taking.  It does not bear much resemblance to what  we thought she was supposed to be using.  She has no carvedilol.  She  has her old prescription for Toprol 100 mg daily in stead.  She is not  taking Furosemide 60 mg but rather 40 mg daily as per her bottle.  She  is taking Furosemide 0.25 mg daily, Mobic 15 mg daily, Aldactone 25 mg  daily, Ramipril 10 mg daily, Warfarin as requested.  INR's are managed  in your office.   PHYSICAL EXAMINATION:  GENERAL:  On examination, pleasant woman in no  acute distress.  VITAL SIGNS:  Weight 225, 4 pounds more than in February, blood pressure  150/80, heart rate 70 and somewhat irregular, respirations 16.  NECK:  No jugular venous distention.  No carotid bruits.  Lungs:  Clear with decreased breath sounds at the bases.  CARDIAC:  Normal first and second heart sounds; no third or fourth heart  sound appreciated; grade 2/6 holosystolic apical murmur.  ABDOMEN:  Soft and nontender; no organomegaly.  EXTREMITIES:  Edema 1+.   STUDIES:  EKG:  Sinus rhythm with fairly frequent PAC's, LVH with  repolarization abnormalities; left atrial enlargement.  When compared to  a prior tracing of September 17, 2001, repolarization abnormalities are  less prominent; nondiagnostic inferior Q waves are no longer present.   IMPRESSION:  We explained the importance to Mrs. Tenaglia of keeping  good track of her medications.  We have provided her with a  check list  to verify that she is always taking what she is supposed to.  We have  confiscated Toprol and given a prescription for Coreg 25 mg b.i.d.  Her  Furosemide will be increased to 60 mg daily.  She will try to do without  Mobic and to resume potassium.  We will monitor her electrolytes.  She  will see the Cardiology nurses every month and will see me again in  three months.    Sincerely,      Gerrit Friends. Dietrich Pates, MD, [facility]  Electronically Signed    RMR/MedQ  DD: 12/21/2006  DT: 12/21/2006  Job #: [id number]

## 2011-01-17 NOTE — Cardiovascular Report (Signed)
[place]. [facility]  Patient:    [name], [name] Visit Number: [id number] MRN: [id number]          Service Type: CAT Location: [id number] Attending Physician:  Daisey Must Dictated by:   Daisey Must, M.D. [facility] Proc. Date: 09/07/01 Admit Date:  09/07/2001   CC:         [name], M.D.             Dietrich Pates, M.D. [facility]             [place] [facility], [place], [place].             Cardiac Catheterization Lab                        Cardiac Catheterization  PROCEDURE PERFORMED:  Right and left heart catheterization with coronary angiography and left ventriculography.  INDICATIONS:  [name] is a 77 year old woman who has had progressive shortness of breath.  An echocardiogram revealed severe left ventricular systolic dysfunction with moderate to severe mitral regurgitation.  She was referred for cardiac catheterization to evaluate her hemodynamics and rule out coronary artery disease as the etiology of her cardiomyopathy.  PROCEDURAL NOTE:  An #8 [place] sheath was placed in the right femoral vein, a #6 French sheath in the right femoral artery.  Right heart catheterization was performed with a Swan-Ganz catheter.  Coronary angiography was performed with standard Judkins #6 French catheters.  Left ventriculography was performed with an angled pigtail catheter.  Contrast was Omnipaque.  There were no complications.  RESULTS:  Hemodynamics:  Right atrial mean pressure 15.  Right ventricular pressure 75/17.  Pulmonary artery pressure 72/30.  Pulmonary capillary wedge mean pressure 27.  Left ventricular pressure 160/30.  Aortic pressure 164/98. There was no aortic valve gradient.  Cardiac output by the thermodilution method is 3.8, cardiac index 1.8. Cardiac output by the Fick method is 3.9, cardiac index 1.85.  Left ventriculogram:  There is severe global hypokinesis to akinesis of the left ventricle.  Ejection fraction is estimated at  15-20%, calculated at 19%. There is 3+ moderate to severe mitral regurgitation.  Coronary arteriography (right dominant): 1. The left main is normal. 2. The left anterior descending artery has minor luminal irregularities in the    mid vessel.  The LAD gives rise to a normal sized first diagonal, a small    second diagonal, and a normal sized third diagonal. 3. The left circumflex gives rise to a normal sized first obtuse marginal    branch and a normal sized second obtuse marginal branch.  The left    circumflex is normal by angiography. 4. The right coronary artery gives rise to a normal sized posterior descending    artery and two small posterolateral branches.  The right coronary artery is    normal by angiography.  IMPRESSIONS: 1. Elevated right and left heart filling pressures with severe pulmonary    hypertension. 2. Severely decreased left ventricular systolic function secondary to    nonischemic cardiomyopathy. 3. Moderate to severe mitral regurgitation. 4. Normal coronary arteries.  PLAN:  The patient will be managed medically. Dictated by:   Daisey Must, M.D. [facility] Attending Physician:  Daisey Must DD:  09/07/01 TD:  09/07/01 Job: [id number]

## 2011-01-17 NOTE — Group Therapy Note (Signed)
NAMEELFREIDA, [name]           ACCOUNT NO.:  [id number]   MEDICAL RECORD NO.:  [id number]          PATIENT TYPE:  INP   LOCATION:  A222                          FACILITY:  [facility]   PHYSICIAN:  Angus G. Renard Matter, MD   DATE OF BIRTH:  08/02/1933   DATE OF PROCEDURE:  09/10/2006  DATE OF DISCHARGE:                                 PROGRESS NOTE   This patient admitted with shortness breath, irregular heartbeat, chest  x-ray evidence of CHF.  She has a known history of cardiomyopathy with  low ejection fraction.  She has diuresed and is doing better.   OBJECTIVE:  VITAL SIGNS:  Blood pressure 128/60, respirations 24, pulse  71, temperature 97.1, O2 sats 98.  The patient has been diuresing and  has negative fluid balance.  Chemistries remain normal with exception of  slight elevation of BUN at 24, creatinine 1.380, GFR 40.  LUNGS:  Clear.  HEART:  Regular rhythm.  ABDOMEN:  No palpable organs or masses.  EXTREMITIES:  Minimal ankle edema   ASSESSMENT:  The patient is admitted with above-stated problems.   PLAN:  Continue current regimen.  The patient may be a candidate for  discharged in the near future.      Angus G. Renard Matter, MD  Electronically Signed     AGM/MEDQ  D:  09/10/2006  T:  09/10/2006  Job:  [id number]

## 2011-01-17 NOTE — Group Therapy Note (Signed)
[name], [name]           ACCOUNT NO.:  [id number]   MEDICAL RECORD NO.:  [id number]          PATIENT TYPE:  INP   LOCATION:  A222                          FACILITY:  [facility]   PHYSICIAN:  Angus G. Renard Matter, MD   DATE OF BIRTH:  Oct 11, 1932   DATE OF PROCEDURE:  09/08/2005  DATE OF DISCHARGE:                                 PROGRESS NOTE   SUBJECTIVE:  This patient was admitted with shortness of breath.  She  did have evidence of congestive heart failure on x-ray and does have a  previous history of low ejection fraction cardiomyopathy.  She has begun  to diurese.   OBJECTIVE:  VITAL SIGNS:  Blood pressure 135/669, respirations 18, pulse  69, temperature 97.2.  Patient has a negative fluid balance of 1200 mL,  low serum potassium 3.1.  LUNGS:  Clear.  HEART:  Irregular rhythm.  ABDOMEN:  No palpable organs or masses.  Minimal ankle edema   ASSESSMENT:  The patient does have a prior history of CVA, does have  prior history of cardiomyopathy.  Was admitted with congestive heart  failure.   PLAN:  Continue current regimen.  Will obtain cardiology consult as  well.      Angus G. Renard Matter, MD  Electronically Signed     AGM/MEDQ  D:  09/08/2006  T:  09/08/2006  Job:  [id number]

## 2011-01-17 NOTE — Discharge Summary (Signed)
[name], [name]           ACCOUNT NO.:  [id number]   MEDICAL RECORD NO.:  [id number]          PATIENT TYPE:  INP   LOCATION:  A222                          FACILITY:  [facility]   PHYSICIAN:  Angus G. Renard Matter, MD   DATE OF BIRTH:  Apr 15, 1933   DATE OF ADMISSION:  09/07/2006  DATE OF DISCHARGE:  01/11/2008LH                               DISCHARGE SUMMARY   PATIENT IDENTIFICATION:  [name] admitted  September 07, 2006, discharged September 11, 2006, four days'  hospitalization.   DIAGNOSES:  1. Congestive heart failure.  2. History of previous cerebrovascular accident.  3. History of previous chronic anticoagulation.  4. Dyslipidemia.  5. Cardiac arrhythmia with premature ventricular contractions.   HISTORY OF PRESENT ILLNESS:  [name]  was seen in the office on day of admission with history of shortness of  breath which had been present for the past few days.  The patient was  seen 3 days prior to admission in the office and at the time, was  complaining of shortness breath and had an irregular heartbeat.  The  patient did have subsequent chest x-ray which showed mild congestive  heart failure.  BNP was elevated.  The patient was given Lasix over the  weekend and began to diurese, but continued to complain of shortness of  breath and irregular heartbeats.  The patient apparently was scheduled  for cataract surgery, but it was felt that she had not adequately  diuresed, so she was admitted on IV Lasix and more thorough evaluation  of her congestive heart failure.   EXAMINATION:  Alert female complaining of shortness of breath.  VITAL  SIGNS:  Blood pressure 140/80, pulse 100, respiration 18, temperature  98, weight 223.  HEENT:  Eyes:  PERRLA.  TMs negative.  Oropharynx:  Benign.  NECK:  Supple.  No JVD or thyroid abnormalities.  HEART RATE:  Irregular rhythm.  LUNGS: Clear to P&A.  ABDOMEN:  No palpable organs or  masses.  SKIN:  Warm and dry.  EXTREMITIES:  Mild edema.   LABORATORY DATA:  Admission CBC:  WBC 5600 with hemoglobin of 12.3 and  hematocrit 37.  Chemistries on admission:  Sodium 142, potassium 3.1,  chloride 108, CO2 27, glucose 107, BUN 11, creatinine 1.07.  Subsequent  chemistries on September 11, 2006:  Sodium 139, potassium 4.1, chloride  104, CO2 27, glucose 100, BUN 22, creatinine 1.38.  GFR was low at 33 L.  Liver enzymes:  SGOT 3.3, SGPT 17, alkaline phosphatase 19, bilirubin  0.6.  CK on admission 155, CK-MB of 2.1, relative index 1.4, troponin  0.05.  TSH 1.345.  Urinalysis negative.   Electrocardiogram:  Sinus rhythm with occasional PVC.   HOSPITAL COURSE:  The patient at the time of her admission was placed at  bedrest and placed on IV fluids of 1/2 normal saline at [facility] rate.  Vital  signs were monitored q.i.d.  [facility] standing orders were in place.  She  was placed on intravenous Lasix 40 mg b.i.d., daily weights and I&O.  Cardiology consult was obtained and she was continued on her home  medicines on Toprol-XL 50 mg, Lanoxin 0.25 mg, Nexium 40 mg, Cl[place]ara  patch 0.5 mg every week.  The patient began to diurese in the hospital  setting and improved symptomatically.  She was seen in consultation by  Dr. Dietrich Pates, who recommend repeat echocardiogram.  The patient has  history of a low ejection fraction of 0.25 to 0.30.  Metoprolol was  changed to carvedilol.  Furosemide was continued and Aldactone was  added.  ACE inhibitor was added.  The patient improved throughout her  hospital stay and was able be discharged home after 4 days.   DISCHARGE MEDICATIONS:  1. To continue her home medicines.  2. Protonix 40 mg daily was added.  3. Coreg 12.5 mg b.i.d. added.  4. Aldactone 25 mg added.  5. Kay Ciel 20 mEq b.i.d. added.  6  Altace 5 mg b.i.d. added.   FOLLOWUP:  The patient was instructed return to the office for followup.   CONDITION ON DISCHARGE:  The patient is  stable and improved at the time  of her discharge.      Angus G. Renard Matter, MD  Electronically Signed     AGM/MEDQ  D:  09/18/2006  T:  09/18/2006  Job:  [id number]

## 2011-01-17 NOTE — Discharge Summary (Signed)
NAMELENETTE, RAU           ACCOUNT NO.:  [id number]   MEDICAL RECORD NO.:  [id number]          PATIENT TYPE:  INP   LOCATION:  A211                          FACILITY:  [facility]   PHYSICIAN:  Angus G. Renard Matter, MD   DATE OF BIRTH:  Jul 23, 1933   DATE OF ADMISSION:  09/27/2007  DATE OF DISCHARGE:  01/30/2009LH                               DISCHARGE SUMMARY   A 77 year old African-American female admitted September 27, 2007,  discharged October 01, 2007, 4 days' hospitalization.   DIAGNOSES:  1. Acute chronic systolic heart failure.  2. Cardiomyopathy.  3. Hypertension.  4. Prior history of cerebrovascular accident.  5. History of hyperlipidemia.  6. Moderate-to-severe mitral regurgitation.   CONDITION:  Stable and improved at the time of her discharge.   This patient was seen in the office on day of admission with chief  complaint being dyspnea with exertion.  Patient states for approximately  5 days prior to her admission, she was had progressive shortness of  breath.  She does have a prior history of nonischemic cardiomyopathy,  previous history of congestive heart failure.  She had been admitted to  the hospital previously with these problems and had been seen by [facility]  Cardiology previously.  She did have a have mildly elevated BNP and x-  ray of chest which showed evidence of pulmonary edema.  She had a known  ejection fraction of 25% by echocardiogram on September 07, 2006.  She does  notice three-pillow orthopnea, occasional PND.   EXAMINATION:  GENERAL:  Alert-appearing female with blood pressure  135/87, respiration 16, pulse 102, temperature 97.9.  HEENT:  Eyes:  PERRLA.  TMs negative.  Oropharynx benign.  NECK:  Supple.  No JVD or thyroid abnormalities.  BREASTS:  Normal, no masses.  HEART:  Regular rhythm, no murmurs.  LUNGS:  Diminished breath sounds bilaterally.  ABDOMEN: No palpable organs or masses.  EXTREMITIES:  Free of edema.   LABORATORY DATA:  Admission  labs:  40% neutrophils, 43% lymphocytes.  PT  and INR:  PT 23.0, INR 1.0.  Chemistry on admission:  Sodium 39,  potassium 3.8, chloride 105, CO2 25, glucose 101, BUN 19, creatinine  1.43.  Subsequent chemistries October 01, 2007:  Sodium 135, potassium  4.2, chloride 104, CO2 25, glucose 24, BUN 34, creatinine 1.20, GFR 36.  Liver enzymes:  SGOT 3, SGPT 18, alkaline phosphatase 20, bilirubin 0.4,  CK on admission 122, CK-MB 2, relative index 1.6, troponin 0.08.  Subsequent cardiac markers September 28, 2007:  CK 90, CK-MB 1.4, troponin  0.07, serum iron 59, iron-binding capacity 248, B12 30.   X-RAYS:  Bilateral carotid Doppler ultrasound:  Mild extensive carotid  bifurcation plaque without hemodynamically significant stenosis.  A 2-D  echo showed have evidence of severely-impaired left ventricle systolic  function, estimated ejection fraction of 0.20 to 0.25.   HOSPITAL COURSE:  The patient at time of her admission was placed on  half-normal saline, KVO rate.  She was placed on telemetry-concentrated  care.  She was given intravenous Lasix of 40 mg b.i.d. and nasal O2 at 3  liters per minute.  She was continued on digoxin 0/0.25 mg daily, KCl 20  mEq b.i.d., Aldactone 25 mg daily, ramipril 10 mg daily, carvedilol 25  mg b.i.d.  She is continued on Coumadin 4 mg daily.  The patient slowly  improved during hospital stay.  Her dyspnea did improve, and she  continued to diurese.  She was seen in consultation by cardiology.  Her  Lasix dosage was increased to 80 mg daily in the morning.  It was felt  that her long-term Coumadin dosage could be discontinued.  Her  echocardiogram did not show evidence of embolic source, and her carotid  Doppler duplex did not show evidence of significant stenosis.  The  patient was felt to have impaired left ventricular function, nonischemic  cardiomyopathy.  She did continue to diurese, continued to improve, was  ambulated.  After 4 days hospitalization, she  was discharged home on a 2-  grams low-sodium diet.   DISCHARGE MEDICATIONS:  She was be discharged on following medications:  1. Altace 10 mg daily.  2. Coreg 25 mg b.i.d.  3. Potassium chloride 20 mEq b.i.d.  4. Aldactone 25 mg daily.  5. Lasix 80 mg daily.  6. KCl 20 mEq b.i.d.  7. Digoxin 0.25 mg daily.  8. Jantovia 4 mg daily.      Angus G. Renard Matter, MD  Electronically Signed     AGM/MEDQ  D:  10/15/2007  T:  10/15/2007  Job:  [id number]

## 2011-01-17 NOTE — Consult Note (Signed)
[name], [name]           ACCOUNT NO.:  [id number]   MEDICAL RECORD NO.:  [id number]          PATIENT TYPE:  INP   LOCATION:  A222                          FACILITY:  [facility]   PHYSICIAN:  Gerrit Friends. Dietrich Pates, MD, FACCDATE OF BIRTH:  12-30-1932   DATE OF CONSULTATION:  09/07/2006  DATE OF DISCHARGE:                                 CONSULTATION   .   REFERRING PHYSICIAN:  Dr. Renard Matter.   PRIMARY CARDIOLOGIST:  Dr. Dietrich Pates.   HISTORY OF PRESENT ILLNESS:  [age]-old woman with known  nonischemic cardiomyopathy, who presents with progressive dyspnea and  radiographic evidence of congestive heart failure.  [name] first  came to Cardiology attention in 2003, when she presented with exertional  dyspnea.  Echocardiography revealed severely impaired left ventricular  systolic function.  She underwent cardiac catheterization, which  revealed no coronary artery disease, moderate mitral regurgitation and  verified impaired LV systolic function.  She has done very well with  medical therapy, appearing in our office only every few years.  She was  last seen in 2005, at which time she was well compensated.  A repeat  echocardiogram revealed some improvement in LV systolic function with an  estimated ejection fraction of 0.25 to 0.30.   Her current problems began approximately 3 months ago when she noted  increasing fatigue and impaired exercise tolerance, but no real  exertional dyspnea.  Over the past week or so, she has experienced  tachypalpitations with mild exertion as well as exertional dyspnea.  Chest x-ray revealed congestive heart failure.  Despite starting oral  diuretics, she came to the emergency department with continuing symptoms  and was admitted.   PAST MEDICAL HISTORY:  Is otherwise notable for remote neurologic  events, hysterectomy, dyslipidemia and bilateral cataracts with surgery  planned in the near future.  She has been chronically anticoagulated  for  presumed cerebrovascular disease.  She has had hypertension that has  been under excellent control.   CURRENT MEDICATIONS ON ADMISSION:  1. Metoprolol 50 mg daily.  2. Digoxin 0.25 mg daily.  3. Nexium 40 mg daily.  4. Warfarin 4 mg daily.  5. Furosemide 40 mg b.i.d., started 2 days prior to presentation.   ALLERGIES:  She has no known allergies.   SOCIAL HISTORY:  Married and lives locally; no use of tobacco products  nor alcohol.   FAMILY HISTORY:  No prominent history of congestive heart failure.   REVIEW OF SYSTEMS:  Vaccinations are up to date.  She has history of  hiatal hernia and GERD as well as arthritic discomfort, predominately in  her knees.  She requires corrective lenses for near vision.  She has  upper and lower dentures.  She has chronic 1+ pitting pedal edema.  All  other systems are reviewed and are negative.   EXAM:  GENERAL:  A pleasant, well-appearing overweight woman.  VITAL SIGNS:  The blood pressure is 140/90, heart rate 80 and regular,  respirations 20, weight 235.  HEENT:  Anicteric sclerae; normal lids and conjunctivae.  Normal oral  mucosa.  NECK:  Mild to moderate jugular venous distention; normal carotid  upstrokes without bruits.  LUNGS:  Clear.  CARDIAC:  Normal 1st and 2nd heart sounds; grade 2-3/6 holosystolic  murmur at the left sternal border and apex; no 3rd heart sound  appreciated; PMI laterally displaced.  ABDOMEN:  Soft and nontender; no organomegaly; no masses; normal bowel  sounds.  EXTREMITIES:  Normal distal pulses; 1+ pretibial and ankle edema.  NEUROLOGIC:  Symmetric strength and tone; normal cranial nerves.  MUSCULOSKELETAL:  No joint deformities.  PSYCHIATRIC:  Alert and oriented; normal affect.   EKG:  Normal sinus rhythm; incomplete left bundle branch block; LVH with  repolarization abnormality; occasional premature ectopic, probably PAC  with aberrancy.   LABORATORY:  Notable for normal CBC, hypokalemia with  potassium of 3.1  and normal renal function, minimally elevated SGOT and SGPT at  presentation and no cardiac markers obtained.   CHEST X-RAY:  Shows cardiomegaly with vascular redistribution, but no  pulmonary edema.   IMPRESSION:  [name] presents with recurrent congestive heart  failure.  Most likely, she simply requires readjustment of her medical  regime.  A repeat echocardiogram will be obtained to reassess cardiac  structure and function.  We will change metoprolol to carvedilol, which  is slightly better drug for this patient's condition.  Furosemide will  be continued.  Aldactone will be added, which will help with the  patient's congestive heart failure as well as her hypokalemia.  An ACE  inhibitor will also be added and titrated.   We greatly appreciate the request to become reinvolved with Ms.  Kloosterman care and will try to follow her appropriately in the office  if followup appointments are kept.      Gerrit Friends. Dietrich Pates, MD, [facility]  Electronically Signed     RMR/MEDQ  D:  09/07/2006  T:  09/08/2006  Job:  [id number]

## 2011-01-17 NOTE — Cardiovascular Report (Signed)
[place]. [facility]  Patient:    [name], [name]. Visit Number: [id number] MRN: [id number]          Service Type: Attending:  Daisey Must, M.D. [facility] Dictated by:   Daisey Must, M.D. [facility] Proc. Date: 09/07/01   CC:         [name], M.D.             Dietrich Pates, M.D. [facility]             [facility], [place], [place].             Cardiac Catheterization Lab                        Cardiac Catheterization  PROCEDURE PERFORMED:  Right and left heart catheterization with coronary angiography and left ventriculography.  INDICATIONS:  [name] is a 77 year old woman who has had progressive dyspnea.  A recent echocardiogram showed severe left ventricular dysfunction with moderate to severe mitral regurgitation.  She was referred for right and left heart catheterization to assess her hemodynamics and to rule out coronary artery disease.  PROCEDURAL NOTE:  An #8 [place] sheath was placed in the right femoral vein, a #6 French sheath in the right femoral artery.  Right heart catheterization was performed with a Swan-Ganz catheter.  Coronary angiography was performed Dictated by:   Daisey Must, M.D. [facility] Attending:  Daisey Must, M.D. [facility] DD:  09/07/01 TD:  09/07/01 Job: [id number]

## 2011-01-17 NOTE — Procedures (Signed)
[name], [name]           ACCOUNT NO.:  [id number]   MEDICAL RECORD NO.:  [id number]          PATIENT TYPE:  INP   LOCATION:  A222                          FACILITY:  [facility]   PHYSICIAN:  Gerrit Friends. Dietrich Pates, MD, FACCDATE OF BIRTH:  [birth year]   DATE OF PROCEDURE:  09/07/2006  DATE OF DISCHARGE:                                ECHOCARDIOGRAM   REFERRING:  Dr. Renard Matter.   CLINICAL DATA:  [age]-old woman with cardiomyopathy and  congestive heart failure.   M-MODE:  Aorta 3.2, left atrium 5.4, septum 1.4, posterior wall 1.4, LV  diastole 6.3, LV systole 5.7.   IMPRESSION:  1. Technically adequate echocardiographic study.  2. Moderate left atrial enlargement; mild to moderate right atrial      enlargement.  3. Mild right ventricular dilatation; normal wall thickness; mild to      moderate impairment in systolic function.  4. Mild aortic valvular sclerosis with trivial insufficiency.  5. Aortic root size normal; mild calcification of the wall.  6. Mild mitral valve thickening; moderate to marked regurgitation.  7. Normal pulmonic valve and proximal pulmonary artery.  8. Normal tricuspid valve with physiologic regurgitation and normal      estimated RV systolic pressure.  9. Mild to moderate left ventricular dilatation; mild to moderate      hypertrophy; severe global hypokinesis, except for the inferoseptal      region, which is virtually akinetic.  Overall function is severely      impaired -- estimated ejection fraction is 0.20 to 0.25.  10.Mild inferior vena cava dilatation; diameter decreases normally      with inspiration.  11.Comparison with prior study of June 24, 2004:  Somewhat further      decrease in ejection fraction; Right ventricle is now dilated with      decreased systolic function; mitral regurgitation has increased in      severity.      Gerrit Friends. Dietrich Pates, MD, [facility]  Electronically Signed     RMR/MEDQ  D:  09/07/2006  T:  09/08/2006   Job:  [id number]

## 2011-01-17 NOTE — Procedures (Signed)
NAMESHAKENA, CALLARI NO.:  [id number]   MEDICAL RECORD NO.:  [id number]          PATIENT TYPE:  OUT   LOCATION:  RAD                           FACILITY:  [facility]   PHYSICIAN:  Vida Roller, M.D.   DATE OF BIRTH:  08/17/1933   DATE OF PROCEDURE:  DATE OF DISCHARGE:                                  ECHOCARDIOGRAM   PRIMARY CARE PHYSICIAN:  Angus G. Renard Matter, M.D.   CARDIOLOGIST:  Atwood Bing, M.D.   TAPE NUMBER:  [id number]   TAPE COUNT:  [id number]   CLINICAL INFORMATION:  This is a 77 year old female with a known  cardiomyopathy.  Last echocardiogram in December 2002 showed an ejection  fraction of 15-20% with global hypokinesis, mild right ventricular  dilatation, moderate to severe mitral regurgitation, trivial aortic  insufficiency, and elevated RV systolic pressure.   TECHNICAL QUALITY:  Today's study is technically adequate.   M-MODE TRACINGS:  1.  The aorta is 31 mm.  2.  The left atrium is 46 mm.  3.  The septum is 14 mm.  4.  The posterior wall is 11 mm.  5.  The left ventricular diastolic diameter is 55 mm.  6.  The left ventricular systolic diameter is 48 mm.   2-D AND DOPPLER IMAGING:  1.  The left ventricle is mildly dilated.  There is evidence of severely      depressed left ventricular systolic function with an estimated ejection      fraction of 25-30%.  There is a global hypokinesis.  There is mild      concentric left ventricular hypertrophy with some predominance in the      septum without a left ventricular outflow gradient.  2.  The right ventricle is normal size with normal systolic function.  3.  Both atria appear to be dilated, left greater than right.  4.  The aortic valve is mildly sclerotic with mild insufficiency.  No      stenosis is seen.  5.  The mitral valve is mildly dilated at its annulus.  There is some mild      annular calcification.  There is no mild-to-moderate regurgitation, no      stenosis is seen.  6.  The  tricuspid valve has mild regurgitation.  7.  Pulmonic valve not well seen.  8.  Pericardial structures without effusion.  9.  Superior vena cava is not well seen.  10. Ascending aorta now well seen.     Trey Paula   JH/MEDQ  D:  06/24/2004  T:  06/24/2004  Job:  [id number]

## 2011-01-17 NOTE — Group Therapy Note (Signed)
NAMEMAKITA, BLOW           ACCOUNT NO.:  [id number]   MEDICAL RECORD NO.:  [id number]          PATIENT TYPE:  INP   LOCATION:  A222                          FACILITY:  [facility]   PHYSICIAN:  Angus G. McInnis, MD   DATE OF BIRTH:  Feb 10, 1933   DATE OF PROCEDURE:  09/09/2006  DATE OF DISCHARGE:                                 PROGRESS NOTE   This patient was admitted with shortness of breath which had been  present for several days prior to admission and irregular heart beat.  She did have chest x-ray evidence of CHF.  She has known history of  cardiomyopathy with low ejection fraction.  She has been diuresing since  admission and feels some better.   OBJECTIVE:  VITAL SIGNS:  Blood pressure 117/55, respirations 24, pulse  70, temperature 98.4.  The patient has negative fluid balance of 800.  She does have low serum potassium at 3.1.  LUNGS:  Clear.  HEART:  Regular rhythm.  ABDOMEN:  No palpable organs or masses.  EXTREMITIES:  Minimal edema.   ASSESSMENT:  Patient admitted with the congestive heart failure.  He  does have a low ejection fraction and cardiomyopathy.  He is being seen  by cardiology as well.   PLAN:  Continue current regimen.      Angus G. Renard Matter, MD  Electronically Signed     AGM/MEDQ  D:  09/09/2006  T:  09/09/2006  Job:  [id number]

## 2011-01-17 NOTE — Letter (Signed)
September 23, 2006    Angus G. Renard Matter, MD  [address]  [place], [place] [address]   RE:  [name], [name]  MRN:  [id number]  /  DOB:  09-Mar-1933   Dear [name]:   [name] returns to the office following a recent admission to [facility] with recurrent congestive heart failure.  As you know, she  has long-standing cardiomyopathy with an ejection fraction of  approximately 20% for the past 6 years or so.  She repeatedly has been  lost to followup in the office, but has done generally well until last  month when she developed recurrent pulmonary edema.  She responded well  to therapy and has done fine since hospital discharge.  She does not  feel quite back to baseline, but is doing most of her housework.   CURRENT MEDICATIONS:  1. Aldactone 25 mg daily.  2. Warfarin 5 mg as directed.  3. Digoxin 0.25 mg daily.  4. Mobic 15 mg daily.  5. Carvedilol 12.5 mg b.i.d.  6. KCl 20 mEq b.i.d.  7. Ramipril 5 mg daily.  8. Furosemide 40 mg daily.   Overall, she is actually taking less medication than she was prior to  hospital admission.  Her prior dose of Furosemide was 60 mg daily.  She  was on Toprol instead of carvedilol.  She was on 10 mg of ramipril.   EXAM:  Pleasant woman in no acute distress.  The weight is 224, 5 pounds less than in October of 2005.  Blood  pressure 125/70, heart rate 72 and regular, respirations 16.  NECK:  No jugular venous distension; normal carotid upstrokes without  bruits.  LUNGS:  Clear.  CARDIAC:  Normal first heart sound; normal second heart sound; grade 1/6  systolic decrescendo murmur at the upper left sternal border.  ABDOMEN:  Soft and nontender; no organomegaly.  EXTREMITIES:  Trace edema.   Recent laboratory in your office includes a potassium of 4.4, BUN 25,  creatinine 1.5, and BNP level of 678.   IMPRESSION:  [name] is doing generally well.  We will adjust her  medications upward, including increasing ramipril  to 10 mg daily and  carvedilol to 25 mg b.i.d.  We will continue to monitor electrolytes  carefully since she is taking aldactone and potassium.  A chemistry  profile will be obtained in 6 weeks.  I will plan to see this nice woman  again in 3 months.  She will be seen by the cardiology nurses in 1  month.    Sincerely,      Gerrit Friends. Dietrich Pates, MD, [facility]  Electronically Signed    RMR/MedQ  DD: 09/23/2006  DT: 09/23/2006  Job #: [id number]

## 2011-01-17 NOTE — Discharge Summary (Signed)
[place]. [facility]  Patient:    [name], [name] Visit Number: [id number] MRN: [id number]          Service Type: CAT Location: [id number] Attending Physician:  Daisey Must Dictated by:   Tereso Newcomer, P.A. Admit Date:  09/07/2001 Discharge Date: 09/08/2001   CC:         [place] office  [name], M.D.   Discharge Summary  DATE OF BIRTH:  June 08, 1934  DISCHARGE DIAGNOSES: 1. Nonischemic cardiomyopathy with an ejection fraction calculated at 19% by    cardiac catheterization; ejection fraction 15-20% by two-dimensional    echocardiogram. 2. Three-plus mitral regurgitation by cardiac catheterization this admission. 3. History of cerebrovascular accident. 4. Hypertension. 5. Chronic Coumadin therapy for history of cerebrovascular accident. 6. History of congestive heart failure.  PROCEDURES:  Cardiac catheterization by Dr. Daisey Must on September 07, 2001, revealing a normal left main, less than 20% stenosis in the LAD, normal left circumflex and RCA.  LV-gram revealing severe global hypo- to akinesis, 3+ MR, EF estimated 15-20%, calculated at 19%.  HISTORY OF PRESENT ILLNESS:  This 77 year old female was originally seen in the [place] office on August 13, 2001, by Dr. Dietrich Pates with complaints of worsening in her chronic shortness of breath and dyspnea on exertion over the prior two months. This occurred with and without activity.  She also complained of chest pressure.  She was set up at that time for a 2-D echocardiogram to evaluate her LV function.  Dr. Tenny Craw noted at that time if her EF was relatively normal, then she would set her up for a Cardiolite stress test.  If her echo showed decreased ejection fraction or wall motion abnormalities, then cardiac catheterization would be recommended.  Her 2-D echocardiogram returned abnormal, with severe global hypokinesis with inferior wall akinesis and marked LV dysfunction  with an EF of 15-20%.  She also had moderate to moderately severe mitral regurgitation noted as well as moderate TR.  Her LV function was worse from her previous EF of 45% by echo in November 1997.  Therefore, the patient was set up for cardiac catheterization.  HOSPITAL COURSE:  She entered [facility] area on September 07, 2001.  She underwent the procedure named above by Dr. Gerri Spore.  She tolerated the procedure well and did not have any complications.  She was started on Altace.  Her Lasix was also adjusted.  On September 08, 2001, Dr. Dietrich Pates saw the patient and felt she was stable enough for discharge to home.  Her Coumadin had been held prior to cardiac catheterization.  This would be resumed.  Aldactone 25 mg a day was also added.  She was discharged to home on her regular dose of potassium.  Her Altace was adjusted to 5 mg b.i.d. at discharge, and her metoprolol was increased to 100 mg a day.  She would go home on an increased dose of Lasix, as well, at 60 mg b.i.d.  Her other medications would be continued.  LABORATORY DATA:  White blood cell count 5800, hemoglobin 11.6, hematocrit 34.6, platelet count 222,000.  INR 1.4 on admission.  Sodium 142, potassium 3.7, chloride 110, CO2 27, glucose 90, BUN 10, creatinine 1.0, calcium 8.4.  DISCHARGE MEDICATIONS:  1. Coumadin 4 mg a day, to be restarted on September 08, 2001.  2. Aldactone 25 mg q.d.  3. Klor-Con 20 mEq q.d.  4. Altace 5 mg b.i.d.  5. Toprol XL 100 mg q.d.  6. Lasix 40 mg, 1-1/2 tablets b.i.d.  7. Aspirin 81 mg a day until Coumadin is therapeutic.  8. Lanoxin 0.25 mg daily.  9. Celebrex 200 mg daily. 10. Premarin 1.25 mg q.d.  ACTIVITY:  No driving, heavy lifting, exertion, or work for three days.  DIET:  Low-fat, low-sodium.  DISCHARGE INSTRUCTIONS:  She is to call our office in [place] for any groin swelling, bleeding, or bruising.  FOLLOW-UP:  Her original follow up was for a P.A. in one  week.  Dr. Tenny Craw requests to see the patient herself, and she will see Dr. Tenny Craw on September 17, 2001, at 2:30 p.m.  A CMET will be drawn.  She will follow up with Dr. Dietrich Pates on October 15, 2001, at 1:45 p.m.  Of note, she will need her PT and INR checked either Friday of this week on September 10, 2001, or Monday of next week.  This will be done through Dr. Renard Matter office as it is usually done.  She will need to contact his office to schedule this laboratory work. Dictated by:   Tereso Newcomer, P.A. Attending Physician:  Daisey Must DD:  09/08/01 TD:  09/08/01 Job: [id number]

## 2011-01-17 NOTE — H&P (Signed)
[name], [name]           ACCOUNT NO.:  [id number]   MEDICAL RECORD NO.:  [id number]          PATIENT TYPE:  INP   LOCATION:  A222                          FACILITY:  [facility]   PHYSICIAN:  Angus G. Renard Matter, MD   DATE OF BIRTH:  Feb 15, 1933   DATE OF ADMISSION:  09/07/2006  DATE OF DISCHARGE:  LH                              HISTORY & PHYSICAL   HISTORY OF PRESENT ILLNESS:  This 77 year old Afro-American female was  seen in the office on the day of admission with a history of shortness  of breath which had been present over the past few days.  This patient  was seen 3 days prior to the day of admission in the office, and at that  time was complaining of shortness of breath and also an irregular  heartbeat.  The patient did have a subsequent chest x-ray which showed  mild congestive heart failure.  A BNP was elevated.  The patient was  given Lasix over the weekend and began to diurese, but continues to  complain of shortness of breath and irregular heartbeats.  The patient  apparently is scheduled for cataract surgery within the week.  It was  felt the patient had not adequately diuresed, and she was admitted for  IV Lasix and more thorough evaluation of congestive heart failure.   SOCIAL HISTORY:  The patient does not smoke or drink alcohol.   FAMILY HISTORY:  See previous record.   PAST MEDICAL AND SURGICAL HISTORY:  1. The patient has a history of previous CVA, TIA.  2. Hysterectomy.  3. 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  4. Dyslipidemia.  5. Chronic anticoagulation.  6. Bilateral cataracts.   ALLERGIES:  The patient has no known allergies.   MEDICATION LIST:  1. Toprol-XL 50 mg daily.  2. Lanoxin 0.25 mg daily.  3. Nexium 40 mg daily.  4. Coumadin 4 mg daily.  5. Lasix 40 mg b.i.d.   REVIEW OF SYSTEMS:  HEENT:  Negative.  CARDIOPULMONARY:  The patient has  had no chest pain but some shortness of breath with exertion over the  past few days.  GI:  No bowel irregularity or  bleeding.  GU:  No dysuria  or hematuria.   PHYSICAL EXAMINATION:  GENERAL:  Alert female complaining of shortness  of breath.  VITAL SIGNS:  Blood pressure 140/80, pulse 100, respirations 18,  temperature 98, weight 223.  HEENT:  Eyes - pupils equal, round and reactive to light and  accommodation.  TMs negative.  Oropharynx benign.  NECK:  Supple.  No JVD.  BREASTS:  Normal, no masses.  HEART:  Irregular rhythm.  LUNGS:  Clear to percussion and auscultation.  ABDOMEN:  No palpable organs or masses.  SKIN:  Warm and dry.  EXTREMITIES:  Mild edema.   DIAGNOSES:  1. Congestive heart failure.  2. History of previous cerebrovascular accident.  3. History of previous chronic anticoagulation.  4. Dyslipidemia.  5. Recent cardiac arrhythmia, premature ventricular contractions.      Angus G. Renard Matter, MD  Electronically Signed     AGM/MEDQ  D:  09/07/2006  T:  09/07/2006  Job:  [id number]

## 2011-02-06 ENCOUNTER — Other Ambulatory Visit: Payer: Self-pay | Admitting: Cardiology

## 2011-02-07 LAB — BASIC METABOLIC PANEL
CO2: 24 mEq/L (ref 19–32)
Chloride: 105 mEq/L (ref 96–112)
Creat: 1.53 mg/dL — ABNORMAL HIGH (ref 0.50–1.10)
Potassium: 3.7 mEq/L (ref 3.5–5.3)
Sodium: 142 mEq/L (ref 135–145)

## 2011-02-08 ENCOUNTER — Encounter: Payer: Self-pay | Admitting: *Deleted

## 2011-02-08 ENCOUNTER — Other Ambulatory Visit: Payer: Self-pay | Admitting: *Deleted

## 2011-02-08 DIAGNOSIS — N182 Chronic kidney disease, stage 2 (mild): Secondary | ICD-10-CM

## 2011-02-08 NOTE — Progress Notes (Signed)
Labs ordered per Dr. Dietrich Pates and recent [facility] on [address]

## 2011-02-10 ENCOUNTER — Ambulatory Visit (INDEPENDENT_AMBULATORY_CARE_PROVIDER_SITE_OTHER): Payer: Medicare Other | Admitting: *Deleted

## 2011-02-10 DIAGNOSIS — I428 Other cardiomyopathies: Secondary | ICD-10-CM

## 2011-02-10 DIAGNOSIS — I4891 Unspecified atrial fibrillation: Secondary | ICD-10-CM

## 2011-02-10 DIAGNOSIS — I5022 Chronic systolic (congestive) heart failure: Secondary | ICD-10-CM

## 2011-03-10 ENCOUNTER — Other Ambulatory Visit: Payer: Self-pay | Admitting: *Deleted

## 2011-03-10 ENCOUNTER — Telehealth: Payer: Self-pay | Admitting: Cardiology

## 2011-03-10 MED ORDER — WARFARIN SODIUM 4 MG PO TABS: 4.0000 mg | ORAL_TABLET | Freq: Every day | ORAL | Status: DC

## 2011-03-10 NOTE — Telephone Encounter (Signed)
Patient needs refills on Warfarin 4mg  sent to [facility] for 1 mth supply and please have them deliver / then send following refills to [facility] / tg

## 2011-03-10 NOTE — Telephone Encounter (Signed)
Sent in rx.

## 2011-04-03 ENCOUNTER — Encounter: Payer: Self-pay | Admitting: Cardiology

## 2011-04-10 ENCOUNTER — Other Ambulatory Visit: Payer: Self-pay | Admitting: Cardiology

## 2011-04-11 LAB — BASIC METABOLIC PANEL
BUN: 21 mg/dL (ref 6–23)
Calcium: 8.6 mg/dL (ref 8.4–10.5)
Glucose, Bld: 111 mg/dL — ABNORMAL HIGH (ref 70–99)
Potassium: 4 mEq/L (ref 3.5–5.3)

## 2011-04-14 ENCOUNTER — Encounter: Payer: Self-pay | Admitting: Cardiology

## 2011-04-14 ENCOUNTER — Ambulatory Visit (HOSPITAL_COMMUNITY)
Admission: RE | Admit: 2011-04-14 | Discharge: 2011-04-14 | Disposition: A | Discharge status: Home / Self Care | Payer: Medicare Other | Source: Ambulatory Visit | Attending: Cardiology | Admitting: Cardiology

## 2011-04-14 ENCOUNTER — Ambulatory Visit (INDEPENDENT_AMBULATORY_CARE_PROVIDER_SITE_OTHER): Payer: Medicare Other | Admitting: Cardiology

## 2011-04-14 DIAGNOSIS — Z7901 Long term (current) use of anticoagulants: Secondary | ICD-10-CM

## 2011-04-14 DIAGNOSIS — R0989 Other specified symptoms and signs involving the circulatory and respiratory systems: Secondary | ICD-10-CM | POA: Insufficient documentation

## 2011-04-14 DIAGNOSIS — M25559 Pain in unspecified hip: Secondary | ICD-10-CM

## 2011-04-14 DIAGNOSIS — N182 Chronic kidney disease, stage 2 (mild): Secondary | ICD-10-CM

## 2011-04-14 DIAGNOSIS — Z950 Presence of cardiac pacemaker: Secondary | ICD-10-CM | POA: Insufficient documentation

## 2011-04-14 DIAGNOSIS — I428 Other cardiomyopathies: Secondary | ICD-10-CM

## 2011-04-14 DIAGNOSIS — I5022 Chronic systolic (congestive) heart failure: Secondary | ICD-10-CM

## 2011-04-14 DIAGNOSIS — I10 Essential (primary) hypertension: Secondary | ICD-10-CM

## 2011-04-14 DIAGNOSIS — D649 Anemia, unspecified: Secondary | ICD-10-CM

## 2011-04-14 DIAGNOSIS — R269 Unspecified abnormalities of gait and mobility: Secondary | ICD-10-CM

## 2011-04-14 DIAGNOSIS — M1990 Unspecified osteoarthritis, unspecified site: Secondary | ICD-10-CM

## 2011-04-14 DIAGNOSIS — R0609 Other forms of dyspnea: Secondary | ICD-10-CM | POA: Insufficient documentation

## 2011-04-14 MED ORDER — POTASSIUM CHLORIDE CRYS ER 20 MEQ PO TBCR: EXTENDED_RELEASE_TABLET | ORAL | Status: DC

## 2011-04-14 MED ORDER — DIGOXIN 125 MCG PO TABS: 125.0000 ug | ORAL_TABLET | Freq: Every day | ORAL | Status: DC

## 2011-04-14 NOTE — Progress Notes (Signed)
HPI : Ms. Maestas returns to the office as scheduled for continued assessment and treatment of long-standing nonischemic cardiomyopathy.  Since her last visit, she has not required urgent medical care nor developed any new medical problems.  She continues to experience stable class III dyspnea on exertion.  She occasionally notes lancinating mid substernal chest discomfort that is fairly severe, but lasts only a matter of seconds.  She denies significant pedal edema, orthopnea or PND and has had no problems with anticoagulation.  Current Outpatient Prescriptions on File Prior to Visit  Medication Sig Dispense Refill  . carvedilol (COREG) 25 MG tablet Take 1 tablet (25 mg total) by mouth 2 (two) times daily.  180 tablet  1  . digoxin (LANOXIN) 0.125 MG tablet Take 1 tablet (125 mcg total) by mouth daily.  90 tablet  1  . Febuxostat (ULORIC) 80 MG TABS Take 1 tablet by mouth daily.        . furosemide (LASIX) 80 MG tablet Take 80 mg by mouth daily.        . hydrALAZINE (APRESOLINE) 100 MG tablet Take 1 tablet (100 mg total) by mouth 3 (three) times daily.  270 tablet  1  . isosorbide mononitrate (IMDUR) 60 MG 24 hr tablet Take 2 tablets (120 mg total) by mouth daily.  180 tablet  1  . Multiple Vitamin (MULTIVITAMIN) tablet Take 1 tablet by mouth daily.        . potassium chloride SA (K-DUR,KLOR-CON) 20 MEQ tablet Take 2 tablet in am and 1 tablet in pm  270 tablet  3  . warfarin (COUMADIN) 4 MG tablet Take 1 tablet (4 mg total) by mouth daily. As directed  45 tablet  0     No Known Allergies    Past medical history, social history, and family history reviewed and updated.  ROS: See history of present illness  PHYSICAL EXAM: BP 127/58  Pulse 58  Resp 19  Ht 5\' 5"  (1.651 m)  Wt 87.599 kg (193 lb 1.9 oz)  BMI 32.14 kg/m2  SpO2 98%  General-Well developed; no acute distress Body habitus-moderately overweight Neck-No JVD; no carotid bruits Lungs-clear lung fields; resonant to  percussion Cardiovascular-normal PMI; normal S1; grade 2-3/6 early to midsystolic ejection murmur; increased P2 Abdomen-normal bowel sounds; soft and non-tender without masses or organomegaly Musculoskeletal-No deformities, no cyanosis or clubbing Neurologic-Normal cranial nerves; symmetric strength and tone Skin-Warm, no significant lesions Extremities-distal pulses intact; trace edema  ASSESSMENT AND PLAN:

## 2011-04-14 NOTE — Assessment & Plan Note (Signed)
Heart rate is well controlled; risk of thromboembolism is adequately managed with Coumadin.

## 2011-04-14 NOTE — Patient Instructions (Signed)
A chest x-ray takes a picture of the organs and structures inside the chest, including the heart, lungs, and blood vessels. This test can show several things, including, whether the heart is enlarges; whether fluid is building up in the lungs; and whether pacemaker / defibrillator leads are still in place.  Your physician recommends that you return for lab work in: 2 months and 4 months  Your physician recommends that you schedule a follow-up appointment in: 6 months

## 2011-04-15 NOTE — Assessment & Plan Note (Signed)
Patient is complaining of marginally increased dyspnea on exertion, but has been class III for some time.  A chest x-ray and BNP level will be obtained for further evaluation.

## 2011-04-15 NOTE — Assessment & Plan Note (Signed)
Hemoglobin of 11/33 in 11/09; 11/34.6 with MCV of 89 in 10/2010; stable anemia-continue to monitor.

## 2011-04-15 NOTE — Assessment & Plan Note (Signed)
Blood pressure control has not been a problem since patient developed severe heart failure.

## 2011-04-15 NOTE — Assessment & Plan Note (Signed)
Creatinine has been stable.  We will continue to follow.

## 2011-04-15 NOTE — Assessment & Plan Note (Signed)
CBC and stool for Hemoccult testing are being monitored to exclude occult GI blood loss.  Hemoccult negative in 10/2010.

## 2011-04-15 NOTE — Assessment & Plan Note (Signed)
Patient has been well compensated for quite some time.  Although she might benefit from addition of an ACE inhibitor, but I am not inclined to disturb her current medical therapy, which appears to be working extremely well.

## 2011-04-15 NOTE — Assessment & Plan Note (Signed)
No recent episodes nor assessment of uric acid level.

## 2011-04-15 NOTE — Assessment & Plan Note (Signed)
There has been spontaneous improvement in knee and hip pain.  She is getting around fairly well and need not consider surgical options at this time.

## 2011-04-25 ENCOUNTER — Encounter: Payer: Self-pay | Admitting: *Deleted

## 2011-04-25 ENCOUNTER — Telehealth: Payer: Self-pay | Admitting: *Deleted

## 2011-04-25 NOTE — Telephone Encounter (Signed)
Attempted to call pt. No answer. Will send letter.

## 2011-04-25 NOTE — Telephone Encounter (Signed)
Message copied by Tyce Delcid L on Fri Apr 25, 2011  1:21 PM ------      Message from: Kathlen Brunswick      Created: Mon Apr 14, 2011  8:46 AM       Lab results reviewed.  Stable renal function.      No change in Rx.

## 2011-05-02 ENCOUNTER — Encounter: Payer: Self-pay | Admitting: Cardiology

## 2011-05-09 ENCOUNTER — Encounter: Payer: Self-pay | Admitting: Internal Medicine

## 2011-05-09 ENCOUNTER — Ambulatory Visit (INDEPENDENT_AMBULATORY_CARE_PROVIDER_SITE_OTHER): Payer: Medicare Other | Admitting: *Deleted

## 2011-05-09 DIAGNOSIS — I5022 Chronic systolic (congestive) heart failure: Secondary | ICD-10-CM

## 2011-05-09 DIAGNOSIS — I428 Other cardiomyopathies: Secondary | ICD-10-CM

## 2011-05-09 DIAGNOSIS — I4891 Unspecified atrial fibrillation: Secondary | ICD-10-CM

## 2011-05-09 LAB — ICD DEVICE OBSERVATION
AL IMPEDENCE ICD: 520 Ohm
AL THRESHOLD: 1 V
BAMS-0001: 170 {beats}/min
BATTERY VOLTAGE: 3.06 V
LV LEAD THRESHOLD: 1 V
PACEART VT: 0
RV LEAD AMPLITUDE: 11.0032 mv
TOT-0002: 0
TZAT-0001ATACH: 2
TZAT-0001ATACH: 3
TZAT-0001FASTVT: 1
TZAT-0001SLOWVT: 1
TZAT-0001SLOWVT: 2
TZAT-0002ATACH: NEGATIVE
TZAT-0002FASTVT: NEGATIVE
TZAT-0004SLOWVT: 8
TZAT-0004SLOWVT: 8
TZAT-0005SLOWVT: 88 pct
TZAT-0012ATACH: 150 ms
TZAT-0012ATACH: 150 ms
TZAT-0012ATACH: 150 ms
TZAT-0012FASTVT: 200 ms
TZAT-0013SLOWVT: 2
TZAT-0018ATACH: NEGATIVE
TZAT-0018ATACH: NEGATIVE
TZAT-0018FASTVT: NEGATIVE
TZAT-0019FASTVT: 8 V
TZAT-0020ATACH: 1.5 ms
TZAT-0020SLOWVT: 1.5 ms
TZAT-0020SLOWVT: 1.5 ms
TZON-0003ATACH: 350 ms
TZON-0003VSLOWVT: 400 ms
TZON-0004VSLOWVT: 20
TZST-0001ATACH: 6
TZST-0001FASTVT: 4
TZST-0001FASTVT: 6
TZST-0001SLOWVT: 3
TZST-0001SLOWVT: 5
TZST-0002ATACH: NEGATIVE
TZST-0002FASTVT: NEGATIVE
TZST-0002FASTVT: NEGATIVE
TZST-0002FASTVT: NEGATIVE
TZST-0003SLOWVT: 15 J
TZST-0003SLOWVT: 25 J
TZST-0003SLOWVT: 35 J
VENTRICULAR PACING ICD: 97.03 pct
VF: 0

## 2011-05-09 NOTE — Progress Notes (Signed)
ICD check with ICM 

## 2011-05-22 LAB — COMPREHENSIVE METABOLIC PANEL
ALT: 20
ALT: 28
AST: 13
AST: 18
Albumin: 3 — ABNORMAL LOW
Alkaline Phosphatase: 86
CO2: 25
Calcium: 8.4
Calcium: 9
Chloride: 105
GFR calc Af Amer: 44 — ABNORMAL LOW
GFR calc Af Amer: 53 — ABNORMAL LOW
GFR calc non Af Amer: 36 — ABNORMAL LOW
Glucose, Bld: 101 — ABNORMAL HIGH
Glucose, Bld: 94
Sodium: 135
Sodium: 139
Total Bilirubin: 0.4
Total Protein: 6.3

## 2011-05-22 LAB — DIFFERENTIAL
Basophils Absolute: 0
Basophils Relative: 1
Basophils Relative: 1
Eosinophils Absolute: 0.2
Eosinophils Absolute: 0.3
Eosinophils Relative: 3
Eosinophils Relative: 4
Lymphs Abs: 2.3
Lymphs Abs: 2.6
Neutrophils Relative %: 40 — ABNORMAL LOW
Neutrophils Relative %: 44

## 2011-05-22 LAB — BLOOD GAS, ARTERIAL
FIO2: 0.21
pCO2 arterial: 34.5 — ABNORMAL LOW
pH, Arterial: 7.427 — ABNORMAL HIGH
pO2, Arterial: 83.8

## 2011-05-22 LAB — CARDIAC PANEL(CRET KIN+CKTOT+MB+TROPI)
CK, MB: 2
Relative Index: INVALID
Total CK: 122
Troponin I: 0.07 — ABNORMAL HIGH

## 2011-05-22 LAB — BASIC METABOLIC PANEL
BUN: 22
BUN: 36 — ABNORMAL HIGH
CO2: 24
Calcium: 8.8
Chloride: 106
Chloride: 107
Creatinine, Ser: 1.39 — ABNORMAL HIGH
Creatinine, Ser: 1.68 — ABNORMAL HIGH
Glucose, Bld: 97
Potassium: 4.3

## 2011-05-22 LAB — CBC
HCT: 33.2 — ABNORMAL LOW
Hemoglobin: 10.8 — ABNORMAL LOW
MCHC: 33.8
MCHC: 33.9
MCV: 89.8
Platelets: 214
RBC: 3.57 — ABNORMAL LOW
WBC: 6.1
WBC: 6.1

## 2011-05-22 LAB — IRON AND TIBC
Iron: 59
Saturation Ratios: 24
TIBC: 248 — ABNORMAL LOW
UIBC: 189

## 2011-05-22 LAB — TSH: TSH: 2.703

## 2011-05-22 LAB — PROTIME-INR
INR: 3.1 — ABNORMAL HIGH
Prothrombin Time: 18.7 — ABNORMAL HIGH
Prothrombin Time: 26.9 — ABNORMAL HIGH
Prothrombin Time: 33.8 — ABNORMAL HIGH

## 2011-05-22 LAB — FERRITIN: Ferritin: 130 (ref 10–291)

## 2011-06-10 ENCOUNTER — Other Ambulatory Visit: Payer: Self-pay | Admitting: Cardiology

## 2011-06-11 LAB — BASIC METABOLIC PANEL
BUN: 24 mg/dL — ABNORMAL HIGH (ref 6–23)
Chloride: 108 mEq/L (ref 96–112)
Potassium: 4 mEq/L (ref 3.5–5.3)

## 2011-07-07 ENCOUNTER — Telehealth: Payer: Self-pay | Admitting: Cardiology

## 2011-07-07 NOTE — Telephone Encounter (Signed)
PT STOPPED IN TO GET RX REFILLS CALLED IN.  DIGOXIN .125MG  AND POTASSIUM  TO [facility]

## 2011-07-08 ENCOUNTER — Other Ambulatory Visit: Payer: Self-pay | Admitting: *Deleted

## 2011-07-08 MED ORDER — DIGOXIN 125 MCG PO TABS: 125.0000 ug | ORAL_TABLET | Freq: Every day | ORAL | Status: DC

## 2011-07-08 MED ORDER — POTASSIUM CHLORIDE CRYS ER 20 MEQ PO TBCR: EXTENDED_RELEASE_TABLET | ORAL | Status: DC

## 2011-07-29 ENCOUNTER — Telehealth: Payer: Self-pay | Admitting: Cardiology

## 2011-07-29 MED ORDER — FUROSEMIDE 80 MG PO TABS: 80.0000 mg | ORAL_TABLET | Freq: Every day | ORAL | Status: DC

## 2011-07-29 NOTE — Telephone Encounter (Signed)
Needs Furosemide called in to [facility].  tg

## 2011-07-29 NOTE — Telephone Encounter (Signed)
90 day supply with 3 refills sent to [facility] on Lasix, as requested by patient.

## 2011-08-07 ENCOUNTER — Encounter: Payer: Self-pay | Admitting: Internal Medicine

## 2011-08-07 ENCOUNTER — Ambulatory Visit (INDEPENDENT_AMBULATORY_CARE_PROVIDER_SITE_OTHER): Payer: Medicare Other | Admitting: Internal Medicine

## 2011-08-07 DIAGNOSIS — Z95810 Presence of automatic (implantable) cardiac defibrillator: Secondary | ICD-10-CM

## 2011-08-07 DIAGNOSIS — I10 Essential (primary) hypertension: Secondary | ICD-10-CM

## 2011-08-07 DIAGNOSIS — I5022 Chronic systolic (congestive) heart failure: Secondary | ICD-10-CM

## 2011-08-07 DIAGNOSIS — I428 Other cardiomyopathies: Secondary | ICD-10-CM

## 2011-08-07 DIAGNOSIS — I4891 Unspecified atrial fibrillation: Secondary | ICD-10-CM

## 2011-08-07 LAB — ICD DEVICE OBSERVATION
AL AMPLITUDE: 2.5 mv
AL IMPEDENCE ICD: 568 Ohm
ATRIAL PACING ICD: 6.19 pct
BAMS-0001: 170 {beats}/min
CHARGE TIME: 10.1 s
FVT: 0
PACEART VT: 0
RV LEAD AMPLITUDE: 7.7 mv
RV LEAD IMPEDENCE ICD: 400 Ohm
TOT-0001: 8
TOT-0002: 0
TZAT-0001ATACH: 1
TZAT-0001ATACH: 3
TZAT-0001FASTVT: 1
TZAT-0001SLOWVT: 1
TZAT-0001SLOWVT: 2
TZAT-0002FASTVT: NEGATIVE
TZAT-0004SLOWVT: 8
TZAT-0012ATACH: 150 ms
TZAT-0012ATACH: 150 ms
TZAT-0012SLOWVT: 200 ms
TZAT-0012SLOWVT: 200 ms
TZAT-0013SLOWVT: 2
TZAT-0013SLOWVT: 2
TZAT-0018ATACH: NEGATIVE
TZAT-0018ATACH: NEGATIVE
TZAT-0018FASTVT: NEGATIVE
TZAT-0018SLOWVT: NEGATIVE
TZAT-0018SLOWVT: NEGATIVE
TZAT-0019SLOWVT: 8 V
TZAT-0019SLOWVT: 8 V
TZAT-0020ATACH: 1.5 ms
TZAT-0020SLOWVT: 1.5 ms
TZAT-0020SLOWVT: 1.5 ms
TZON-0003ATACH: 350 ms
TZON-0003SLOWVT: 330 ms
TZON-0003VSLOWVT: 400 ms
TZON-0004SLOWVT: 16
TZON-0005SLOWVT: 12
TZST-0001ATACH: 4
TZST-0001ATACH: 5
TZST-0001ATACH: 6
TZST-0001FASTVT: 2
TZST-0001FASTVT: 4
TZST-0001FASTVT: 6
TZST-0001SLOWVT: 3
TZST-0001SLOWVT: 5
TZST-0002ATACH: NEGATIVE
TZST-0002ATACH: NEGATIVE
TZST-0002FASTVT: NEGATIVE
TZST-0002FASTVT: NEGATIVE
TZST-0002FASTVT: NEGATIVE
TZST-0003SLOWVT: 25 J
TZST-0003SLOWVT: 35 J

## 2011-08-07 NOTE — Assessment & Plan Note (Signed)
Her device is working normally. Will recheck in several months. 

## 2011-08-07 NOTE — Assessment & Plan Note (Signed)
She has been out of her lasix. Her optivol is up as is her dyspnea. Today she will increased lasix to 80 mg twice daily for two days, then back to once daily. She will continue her other heart failure meds.

## 2011-08-07 NOTE — Progress Notes (Signed)
HPI [name] returns today for followup. She is a pleasant 77 yo woman with a h/o DCM, CHF, chronic systolic, HTN, and PAF. The patient admits to medical indiscretion. She ran out of her lasix. She has had no syncope. She has had difficulty sleeping. No other problems except increased dyspnea.  No ICD shocks. Allergies  Allergen Reactions  . Tuberculin Tests Swelling     Current Outpatient Prescriptions  Medication Sig Dispense Refill  . carvedilol (COREG) 25 MG tablet Take 1 tablet (25 mg total) by mouth 2 (two) times daily.  180 tablet  1  . digoxin (LANOXIN) 0.125 MG tablet Take 1 tablet (125 mcg total) by mouth daily.  90 tablet  3  . Febuxostat (ULORIC) 80 MG TABS Take 1 tablet by mouth daily.        . furosemide (LASIX) 80 MG tablet Take 1 tablet (80 mg total) by mouth daily.  90 tablet  3  . hydrALAZINE (APRESOLINE) 100 MG tablet Take 1 tablet (100 mg total) by mouth 3 (three) times daily.  270 tablet  1  . isosorbide mononitrate (IMDUR) 60 MG 24 hr tablet Take 2 tablets (120 mg total) by mouth daily.  180 tablet  1  . Multiple Vitamin (MULTIVITAMIN) tablet Take 1 tablet by mouth daily.        . potassium chloride SA (K-DUR,KLOR-CON) 20 MEQ tablet Take 2 tablet in am and 1 tablet in pm  270 tablet  3  . warfarin (COUMADIN) 4 MG tablet Take 1 tablet (4 mg total) by mouth daily. As directed  45 tablet  0     Past Medical History  Diagnosis Date  . Nonischemic cardiomyopathy     Initial dx 1992; nl coronary angios  in 1/03; AICD/bivent. pacemaker - 4/09; EF 20% in '92, 15-20% in '02, 20-25% in '08, 40-45% in 6/10.; digoxin toxicity in 2009; ACE Inhibitor discontinued in 2010 during hospital admission  for low output syndrome  . Hypertension     04/2011-normal electrolytes  . Chronic kidney disease, stage II (mild)     creatinine of 1.4 - 3/08, 2.1 - 2/09, 1.55 - 3/10, 1.4 - 7/10; 1.63 in 04/2011  . CVA (cerebral vascular accident) 67  . PSVT (paroxysmal supraventricular  tachycardia)     s/p radiofrequency ablation  . Gout     uric acid of 10 6/11  . Diabetes mellitus     dietary management   . Anemia     hemoglobin of 11/33 in 11/09; 11/34.6 with MCV of 89 in 10/2010  . Thrombus     left upper extremity; following device insertion  . Fall     in bathroom; 12/10  . DJD (degenerative joint disease)     knees; right hip  . Chronic anticoagulation     Hemoccult-negative in 10/2010    ROS:   All systems reviewed and negative except as noted in the HPI.   Past Surgical History  Procedure Date  . Abdominal hysterectomy   . Pacemaker insertion 4/09    Medtronic: AICD/bi-V pacing     Family History  Problem Relation Age of Onset  . Diabetes Brother   . Heart attack Sister 81    also has liver problems      History   Social History  . Marital Status: Married    Spouse Name: N/A    Number of Children: N/A  . Years of Education: N/A   Occupational History  . Not on file.  Social History Main Topics  . Smoking status: Never Smoker   . Smokeless tobacco: Never Used   Comment: tobacco use  - no  . Alcohol Use: No  . Drug Use: No  . Sexually Active: Not on file   Other Topics Concern  . Not on file   Social History Narrative   Married, retired, does not get regular exercise, no caffeine.      BP 131/77  Pulse 70  Ht 5\' 9"  (1.753 m)  Wt 89.359 kg (197 lb)  BMI 29.09 kg/m2  Physical Exam:  Chronically ill appearing NAD HEENT: Unremarkable Neck:  No JVD, no thyromegally Lymphatics:  No adenopathy Back:  No CVA tenderness Lungs:  Clear with no wheezes. Basilar rales are present.  HEART:  Regular rate rhythm, no murmurs, no rubs, no clicks Abd:  soft, positive bowel sounds, no organomegally, no rebound, no guarding Ext:  2 plus pulses, no edema, no cyanosis, no clubbing Skin:  No rashes no nodules Neuro:  CN II through XII intact, motor grossly intact  DEVICE  Normal device function.  See PaceArt for details.    Assess/Plan:

## 2011-08-07 NOTE — Patient Instructions (Signed)
Your physician has recommended you make the following change in your medication: take 2 tablets of Furosemide daily and 2 tanlets of Potassium twice daily for 2 days then decrease back to original dose  Your physician recommends that you schedule a follow-up appointment in: 3 months for device check and in 1 year with Dr. Ladona Ridgel

## 2011-08-07 NOTE — Assessment & Plan Note (Signed)
Her blood pressure is fairly well controlled. She will continue her current meds. 

## 2011-08-07 NOTE — Assessment & Plan Note (Signed)
With each visit, she has a bit more atrial fib. I have recommended that we undergo watchful waiting but would consider amio if her atrial fib worsens.

## 2011-08-13 ENCOUNTER — Other Ambulatory Visit: Payer: Self-pay | Admitting: Cardiology

## 2011-08-14 LAB — BASIC METABOLIC PANEL
BUN: 34 mg/dL — ABNORMAL HIGH (ref 6–23)
CO2: 27 mEq/L (ref 19–32)
Calcium: 9 mg/dL (ref 8.4–10.5)
Chloride: 101 mEq/L (ref 96–112)
Creat: 1.78 mg/dL — ABNORMAL HIGH (ref 0.50–1.10)
Glucose, Bld: 109 mg/dL — ABNORMAL HIGH (ref 70–99)
Potassium: 3.8 mEq/L (ref 3.5–5.3)
Sodium: 140 mEq/L (ref 135–145)

## 2011-10-03 ENCOUNTER — Other Ambulatory Visit (HOSPITAL_COMMUNITY): Payer: Self-pay | Admitting: Family Medicine

## 2011-10-03 ENCOUNTER — Ambulatory Visit (HOSPITAL_COMMUNITY)
Admission: RE | Admit: 2011-10-03 | Discharge: 2011-10-03 | Disposition: A | Discharge status: Home / Self Care | Payer: Medicare Other | Source: Ambulatory Visit | Attending: Family Medicine | Admitting: Family Medicine

## 2011-10-03 DIAGNOSIS — R222 Localized swelling, mass and lump, trunk: Secondary | ICD-10-CM | POA: Insufficient documentation

## 2011-10-13 ENCOUNTER — Ambulatory Visit (INDEPENDENT_AMBULATORY_CARE_PROVIDER_SITE_OTHER): Payer: Medicare Other | Admitting: Cardiology

## 2011-10-13 ENCOUNTER — Encounter: Payer: Self-pay | Admitting: Cardiology

## 2011-10-13 DIAGNOSIS — I4891 Unspecified atrial fibrillation: Secondary | ICD-10-CM

## 2011-10-13 DIAGNOSIS — I428 Other cardiomyopathies: Secondary | ICD-10-CM

## 2011-10-13 DIAGNOSIS — Z7901 Long term (current) use of anticoagulants: Secondary | ICD-10-CM

## 2011-10-13 DIAGNOSIS — I10 Essential (primary) hypertension: Secondary | ICD-10-CM

## 2011-10-13 DIAGNOSIS — I6350 Cerebral infarction due to unspecified occlusion or stenosis of unspecified cerebral artery: Secondary | ICD-10-CM

## 2011-10-13 DIAGNOSIS — N182 Chronic kidney disease, stage 2 (mild): Secondary | ICD-10-CM

## 2011-10-13 DIAGNOSIS — I5022 Chronic systolic (congestive) heart failure: Secondary | ICD-10-CM

## 2011-10-13 DIAGNOSIS — D649 Anemia, unspecified: Secondary | ICD-10-CM

## 2011-10-13 DIAGNOSIS — Z95810 Presence of automatic (implantable) cardiac defibrillator: Secondary | ICD-10-CM

## 2011-10-13 MED ORDER — ISOSORBIDE MONONITRATE ER 60 MG PO TB24: 120.0000 mg | ORAL_TABLET | Freq: Every day | ORAL | Status: DC

## 2011-10-13 NOTE — Assessment & Plan Note (Signed)
Atrial fibrillation persists.  Sinus rhythm has not been documented for years suggesting that atrial fibrillation is permanent.  Heart rate control is good with current 2 drug regimen.

## 2011-10-13 NOTE — Assessment & Plan Note (Signed)
CHF is compensated with current medication.

## 2011-10-13 NOTE — Patient Instructions (Signed)
Your physician recommends that you schedule a follow-up appointment in: 12 months.  

## 2011-10-13 NOTE — Progress Notes (Signed)
Patient ID: [name], female   DOB: June 12, 1933, 78 y.o.   MRN: [id number] HPI: Scheduled return visit for this lovely woman with long-standing cardiomyopathy.  Since her last visit, she has been stable.  She has noted no pedal edema, has had no orthopnea and has experienced no PND.  She has chronic class 2-3 exertional dyspnea that is generally under good control.  Weight has not increased and is down from her peak weight.  She denies lightheadedness and syncope.  Prior to Admission medications   Medication Sig Start Date End Date Taking? Authorizing Provider  carvedilol (COREG) 25 MG tablet Take 1 tablet (25 mg total) by mouth 2 (two) times daily. 11/28/10  Yes Gerrit Friends. Shiree Altemus, MD  digoxin (LANOXIN) 0.125 MG tablet Take 1 tablet (125 mcg total) by mouth daily. 07/08/11  Yes Gerrit Friends. Carrieann Spielberg, MD  Febuxostat (ULORIC) 80 MG TABS Take 1 tablet by mouth daily.     Yes Historical Provider, MD  furosemide (LASIX) 80 MG tablet Take 1 tablet (80 mg total) by mouth daily. 07/29/11  Yes Gerrit Friends. Fraida Veldman, MD  hydrALAZINE (APRESOLINE) 100 MG tablet Take 1 tablet (100 mg total) by mouth 3 (three) times daily. 11/28/10  Yes Gerrit Friends. Maveryck Bahri, MD  HYDROcodone-acetaminophen (VICODIN ES) 7.5-750 MG per tablet Take 1 tablet by mouth every 6 (six) hours as needed.   Yes Historical Provider, MD  isosorbide mononitrate (IMDUR) 60 MG 24 hr tablet Take 2 tablets (120 mg total) by mouth daily. 11/28/10  Yes Gerrit Friends. Donavon Kimrey, MD  Multiple Vitamin (MULTIVITAMIN) tablet Take 1 tablet by mouth daily.     Yes Historical Provider, MD  nitroGLYCERIN (NITROSTAT) 0.4 MG SL tablet Place 0.4 mg under the tongue every 5 (five) minutes as needed.   Yes Historical Provider, MD  potassium chloride SA (K-DUR,KLOR-CON) 20 MEQ tablet Take 2 tablet in am and 1 tablet in pm 07/08/11  Yes Gerrit Friends. Carmesha Morocco, MD  warfarin (COUMADIN) 4 MG tablet Take 1 tablet (4 mg total) by mouth daily. As directed 03/10/11  Yes Gerrit Friends. Dietrich Pates, MD     Allergies  Allergen Reactions  . Tuberculin Tests Swelling      Past medical history, social history, and family history reviewed and updated.  ROS: Chronic low back problems, intermittent neck pain; no hospitalizations, no requirement for urgent medical care.  PHYSICAL EXAM: BP 104/64  Pulse 68  Resp 18  Ht 5\' 8"  (1.727 m)  Wt 87.998 kg (194 lb)  BMI 29.50 kg/m2; weight increased 1 pound since last visit General-Well developed; no acute distress Body habitus-mildly to moderately overweight Neck-No JVD; no carotid bruits Lungs-clear lung fields; resonant to percussion Cardiovascular-normal PMI; normal S1 and S2; irregular rhythm; grade 2/6 systolic decrescendo murmur at the left sternal border Abdomen-normal bowel sounds; soft and non-tender without masses or organomegaly Musculoskeletal-No deformities, no cyanosis or clubbing Neurologic-Normal cranial nerves; symmetric strength and tone Skin-Warm, no significant lesions Extremities-distal pulses intact; trace edema  ASSESSMENT AND PLAN:  [place] Bing, MD 10/13/2011 1:37 PM

## 2011-10-13 NOTE — Assessment & Plan Note (Signed)
No symptoms to suggest progression of cerebrovascular disease.  Normal carotid ultrasound study 4 years ago.  Will defer repeat imaging at present.

## 2011-10-13 NOTE — Assessment & Plan Note (Signed)
She continues to be remarkably stable with compensated CHF and a cardiomyopathic process that has been present for at least 20 years.  Current medical therapy appears optimal.

## 2011-10-13 NOTE — Assessment & Plan Note (Signed)
Mild chronic anemia, likely representing anemia of chronic disease.  Multiple evaluations within the past few years have shown negative stool Hemoccults and no evidence for iron deficiency.

## 2011-10-13 NOTE — Assessment & Plan Note (Signed)
Mild to moderate and stable chronic kidney disease.  Patient is tolerating medications for chronic CHF without significant deterioration in renal function although she has not been treated with an ACE inhibitor.  Digoxin dosage is appropriate to degree of renal dysfunction.

## 2011-10-13 NOTE — Assessment & Plan Note (Signed)
Mild anemia does not appear related to anticoagulation.  Risk of thromboembolism is substantial, and treatment with full dose warfarin will be continued.

## 2011-10-13 NOTE — Assessment & Plan Note (Signed)
Device was working well when assessed 2 months ago.  Dr. Ladona Ridgel continues to evaluate every 3 months.

## 2011-10-13 NOTE — Assessment & Plan Note (Signed)
Blood pressure control is excellent. Current medications will be continued. 

## 2011-11-14 ENCOUNTER — Ambulatory Visit (INDEPENDENT_AMBULATORY_CARE_PROVIDER_SITE_OTHER): Payer: Medicare Other | Admitting: *Deleted

## 2011-11-14 ENCOUNTER — Encounter: Payer: Self-pay | Admitting: Internal Medicine

## 2011-11-14 DIAGNOSIS — I428 Other cardiomyopathies: Secondary | ICD-10-CM

## 2011-11-14 DIAGNOSIS — I5022 Chronic systolic (congestive) heart failure: Secondary | ICD-10-CM

## 2011-11-14 LAB — ICD DEVICE OBSERVATION
AL IMPEDENCE ICD: 592 Ohm
BATTERY VOLTAGE: 3.02 V
BRDY-0004LV: 120 {beats}/min
LV LEAD IMPEDENCE ICD: 696 Ohm
LV LEAD THRESHOLD: 1.5 V
TOT-0002: 0
TOT-0006: 20090504000000
TZAT-0001ATACH: 1
TZAT-0001ATACH: 2
TZAT-0001ATACH: 3
TZAT-0001SLOWVT: 1
TZAT-0001SLOWVT: 2
TZAT-0002ATACH: NEGATIVE
TZAT-0002ATACH: NEGATIVE
TZAT-0002FASTVT: NEGATIVE
TZAT-0004SLOWVT: 8
TZAT-0004SLOWVT: 8
TZAT-0005SLOWVT: 88 pct
TZAT-0005SLOWVT: 91 pct
TZAT-0012ATACH: 150 ms
TZAT-0012ATACH: 150 ms
TZAT-0012FASTVT: 200 ms
TZAT-0018ATACH: NEGATIVE
TZAT-0018ATACH: NEGATIVE
TZAT-0018ATACH: NEGATIVE
TZAT-0018FASTVT: NEGATIVE
TZAT-0018SLOWVT: NEGATIVE
TZAT-0019ATACH: 6 V
TZAT-0019ATACH: 6 V
TZAT-0019FASTVT: 8 V
TZAT-0020ATACH: 1.5 ms
TZAT-0020SLOWVT: 1.5 ms
TZAT-0020SLOWVT: 1.5 ms
TZON-0003VSLOWVT: 400 ms
TZON-0004VSLOWVT: 20
TZST-0001ATACH: 6
TZST-0001FASTVT: 5
TZST-0001FASTVT: 6
TZST-0001SLOWVT: 3
TZST-0001SLOWVT: 5
TZST-0001SLOWVT: 6
TZST-0002ATACH: NEGATIVE
TZST-0002FASTVT: NEGATIVE
TZST-0002FASTVT: NEGATIVE
TZST-0002FASTVT: NEGATIVE
TZST-0003SLOWVT: 35 J
VENTRICULAR PACING ICD: 95.65 pct

## 2011-11-14 NOTE — Progress Notes (Signed)
ICD check with ICM 

## 2012-02-18 ENCOUNTER — Ambulatory Visit (INDEPENDENT_AMBULATORY_CARE_PROVIDER_SITE_OTHER): Payer: Medicare Other | Admitting: *Deleted

## 2012-02-18 ENCOUNTER — Encounter: Payer: Self-pay | Admitting: Internal Medicine

## 2012-02-18 DIAGNOSIS — I5022 Chronic systolic (congestive) heart failure: Secondary | ICD-10-CM

## 2012-02-18 DIAGNOSIS — I428 Other cardiomyopathies: Secondary | ICD-10-CM

## 2012-02-18 DIAGNOSIS — I482 Chronic atrial fibrillation, unspecified: Secondary | ICD-10-CM

## 2012-02-18 DIAGNOSIS — I4891 Unspecified atrial fibrillation: Secondary | ICD-10-CM

## 2012-02-18 LAB — ICD DEVICE OBSERVATION
ATRIAL PACING ICD: 4.9 pct
BAMS-0001: 170 {beats}/min
CHARGE TIME: 10.26 s
FVT: 0
LV LEAD THRESHOLD: 1 V
PACEART VT: 0
RV LEAD AMPLITUDE: 7.4 mv
RV LEAD THRESHOLD: 1 V
TOT-0001: 8
TZAT-0002ATACH: NEGATIVE
TZAT-0002ATACH: NEGATIVE
TZAT-0004SLOWVT: 8
TZAT-0004SLOWVT: 8
TZAT-0005SLOWVT: 88 pct
TZAT-0005SLOWVT: 91 pct
TZAT-0011SLOWVT: 10 ms
TZAT-0011SLOWVT: 10 ms
TZAT-0012ATACH: 150 ms
TZAT-0012ATACH: 150 ms
TZAT-0012FASTVT: 200 ms
TZAT-0012SLOWVT: 200 ms
TZAT-0012SLOWVT: 200 ms
TZAT-0018ATACH: NEGATIVE
TZAT-0019ATACH: 6 V
TZAT-0019ATACH: 6 V
TZAT-0019FASTVT: 8 V
TZAT-0020ATACH: 1.5 ms
TZAT-0020ATACH: 1.5 ms
TZAT-0020ATACH: 1.5 ms
TZAT-0020FASTVT: 1.5 ms
TZON-0003ATACH: 350 ms
TZON-0003SLOWVT: 330 ms
TZON-0003VSLOWVT: 400 ms
TZST-0001ATACH: 4
TZST-0001FASTVT: 2
TZST-0001FASTVT: 6
TZST-0001SLOWVT: 4
TZST-0002FASTVT: NEGATIVE
TZST-0002FASTVT: NEGATIVE
TZST-0002FASTVT: NEGATIVE
TZST-0003SLOWVT: 15 J
TZST-0003SLOWVT: 35 J
TZST-0003SLOWVT: 35 J

## 2012-02-18 NOTE — Progress Notes (Signed)
defib check in clinic  

## 2012-02-19 ENCOUNTER — Other Ambulatory Visit: Payer: Self-pay | Admitting: *Deleted

## 2012-02-19 MED ORDER — POTASSIUM CHLORIDE CRYS ER 20 MEQ PO TBCR: EXTENDED_RELEASE_TABLET | ORAL | Status: DC

## 2012-02-19 MED ORDER — HYDRALAZINE HCL 100 MG PO TABS: 100.0000 mg | ORAL_TABLET | Freq: Three times a day (TID) | ORAL | Status: DC

## 2012-05-14 ENCOUNTER — Encounter: Payer: Self-pay | Admitting: *Deleted

## 2012-05-26 ENCOUNTER — Ambulatory Visit (INDEPENDENT_AMBULATORY_CARE_PROVIDER_SITE_OTHER): Payer: Medicare Other | Admitting: *Deleted

## 2012-05-26 DIAGNOSIS — I5022 Chronic systolic (congestive) heart failure: Secondary | ICD-10-CM

## 2012-05-26 DIAGNOSIS — I482 Chronic atrial fibrillation, unspecified: Secondary | ICD-10-CM

## 2012-05-26 DIAGNOSIS — I4891 Unspecified atrial fibrillation: Secondary | ICD-10-CM

## 2012-05-26 DIAGNOSIS — I428 Other cardiomyopathies: Secondary | ICD-10-CM

## 2012-05-26 LAB — ICD DEVICE OBSERVATION
AL IMPEDENCE ICD: 496 Ohm
AL THRESHOLD: 1 V
BAMS-0001: 170 {beats}/min
BATTERY VOLTAGE: 2.97 V
FVT: 0
LV LEAD THRESHOLD: 1.5 V
PACEART VT: 0
RV LEAD AMPLITUDE: 12.4 mv
TOT-0002: 0
TZAT-0001ATACH: 2
TZAT-0001ATACH: 3
TZAT-0001FASTVT: 1
TZAT-0001SLOWVT: 1
TZAT-0001SLOWVT: 2
TZAT-0002ATACH: NEGATIVE
TZAT-0004SLOWVT: 8
TZAT-0004SLOWVT: 8
TZAT-0012ATACH: 150 ms
TZAT-0012ATACH: 150 ms
TZAT-0012ATACH: 150 ms
TZAT-0012FASTVT: 200 ms
TZAT-0018ATACH: NEGATIVE
TZAT-0018FASTVT: NEGATIVE
TZAT-0019FASTVT: 8 V
TZAT-0020ATACH: 1.5 ms
TZAT-0020SLOWVT: 1.5 ms
TZAT-0020SLOWVT: 1.5 ms
TZON-0003ATACH: 350 ms
TZON-0003SLOWVT: 330 ms
TZON-0003VSLOWVT: 400 ms
TZON-0004VSLOWVT: 20
TZST-0001ATACH: 4
TZST-0001ATACH: 6
TZST-0001FASTVT: 2
TZST-0001FASTVT: 4
TZST-0001FASTVT: 6
TZST-0001SLOWVT: 3
TZST-0001SLOWVT: 5
TZST-0002ATACH: NEGATIVE
TZST-0002FASTVT: NEGATIVE
TZST-0002FASTVT: NEGATIVE
TZST-0002FASTVT: NEGATIVE
TZST-0003SLOWVT: 15 J
TZST-0003SLOWVT: 25 J
VENTRICULAR PACING ICD: 94.01 pct
VF: 0

## 2012-05-26 NOTE — Progress Notes (Signed)
defib check w/icm in clinic

## 2012-06-07 ENCOUNTER — Other Ambulatory Visit: Payer: Self-pay | Admitting: Cardiology

## 2012-06-15 ENCOUNTER — Encounter: Payer: Self-pay | Admitting: Internal Medicine

## 2012-07-16 ENCOUNTER — Other Ambulatory Visit: Payer: Self-pay | Admitting: Cardiology

## 2012-07-19 ENCOUNTER — Encounter: Payer: Medicare Other | Admitting: Cardiology

## 2012-07-21 NOTE — Progress Notes (Signed)
This encounter was created in error - please disregard.

## 2012-07-23 ENCOUNTER — Other Ambulatory Visit: Payer: Self-pay | Admitting: Cardiology

## 2012-08-02 ENCOUNTER — Encounter: Payer: Self-pay | Admitting: Internal Medicine

## 2012-08-02 ENCOUNTER — Ambulatory Visit (INDEPENDENT_AMBULATORY_CARE_PROVIDER_SITE_OTHER): Payer: Medicare Other | Admitting: Internal Medicine

## 2012-08-02 VITALS — BP 144/84 | HR 87 | Ht 71.0 in | Wt 190.1 lb

## 2012-08-02 DIAGNOSIS — I428 Other cardiomyopathies: Secondary | ICD-10-CM

## 2012-08-02 DIAGNOSIS — I4891 Unspecified atrial fibrillation: Secondary | ICD-10-CM

## 2012-08-02 DIAGNOSIS — Z95810 Presence of automatic (implantable) cardiac defibrillator: Secondary | ICD-10-CM

## 2012-08-02 DIAGNOSIS — I482 Chronic atrial fibrillation, unspecified: Secondary | ICD-10-CM

## 2012-08-02 DIAGNOSIS — I5022 Chronic systolic (congestive) heart failure: Secondary | ICD-10-CM

## 2012-08-02 LAB — ICD DEVICE OBSERVATION
AL AMPLITUDE: 1.9349 mv
ATRIAL PACING ICD: 5.61 pct
CHARGE TIME: 10.51 s
FVT: 0
LV LEAD IMPEDENCE ICD: 720 Ohm
RV LEAD AMPLITUDE: 7.1098 mv
RV LEAD IMPEDENCE ICD: 424 Ohm
TOT-0001: 8
TOT-0002: 0
TOT-0006: 20090504000000
TZAT-0001ATACH: 3
TZAT-0001FASTVT: 1
TZAT-0001SLOWVT: 1
TZAT-0001SLOWVT: 2
TZAT-0002FASTVT: NEGATIVE
TZAT-0012ATACH: 150 ms
TZAT-0012ATACH: 150 ms
TZAT-0012ATACH: 150 ms
TZAT-0012FASTVT: 200 ms
TZAT-0012SLOWVT: 200 ms
TZAT-0012SLOWVT: 200 ms
TZAT-0013SLOWVT: 2
TZAT-0018ATACH: NEGATIVE
TZAT-0018FASTVT: NEGATIVE
TZAT-0019ATACH: 6 V
TZAT-0019SLOWVT: 8 V
TZAT-0019SLOWVT: 8 V
TZAT-0020ATACH: 1.5 ms
TZAT-0020ATACH: 1.5 ms
TZAT-0020SLOWVT: 1.5 ms
TZAT-0020SLOWVT: 1.5 ms
TZON-0003SLOWVT: 330 ms
TZON-0003VSLOWVT: 400 ms
TZST-0001ATACH: 4
TZST-0001ATACH: 6
TZST-0001FASTVT: 2
TZST-0001FASTVT: 4
TZST-0001FASTVT: 5
TZST-0001FASTVT: 6
TZST-0001SLOWVT: 3
TZST-0001SLOWVT: 5
TZST-0002ATACH: NEGATIVE
TZST-0002FASTVT: NEGATIVE
TZST-0002FASTVT: NEGATIVE
TZST-0002FASTVT: NEGATIVE
TZST-0003SLOWVT: 25 J
TZST-0003SLOWVT: 35 J

## 2012-08-02 NOTE — Patient Instructions (Addendum)
Your physician recommends that you schedule a follow-up appointment in: ONE YEAR WITH GT/3 MONTHS WITH PAULA

## 2012-08-02 NOTE — Assessment & Plan Note (Signed)
Her congestive heart failure symptoms remain class II. She will continue her current medical therapy. I've instructed the patient to maintain a low-sodium diet.

## 2012-08-02 NOTE — Assessment & Plan Note (Signed)
Her Medtronic biventricular ICD is working normally. No change in outputs. We'll plan to recheck in several months.

## 2012-08-02 NOTE — Progress Notes (Signed)
HPI [name] returns today for followup. She is a very pleasant 78 year old woman with a history of chronic systolic heart failure, status post biventricular ICD implantation. She also has an ischemic cardiomyopathy. The patient denies chest pain or shortness of breath. She denies dietary indiscretion. No syncope and no ICD shock. Allergies  Allergen Reactions  . Tuberculin Tests Swelling     Current Outpatient Prescriptions  Medication Sig Dispense Refill  . carvedilol (COREG) 25 MG tablet Take 1 tablet (25 mg total) by mouth 2 (two) times daily.  180 tablet  1  . colchicine 0.6 MG tablet Take 0.6 mg by mouth daily.      . Febuxostat (ULORIC) 80 MG TABS Take 1 tablet by mouth daily.        . furosemide (LASIX) 80 MG tablet TAKE 1 TABLET BY MOUTH DAILY  90 tablet  1  . hydrALAZINE (APRESOLINE) 100 MG tablet TAKE 1 TABLET BY MOUTH THREE TIMES DAILY  270 tablet  0  . HYDROcodone-acetaminophen (NORCO) 7.5-325 MG per tablet Take 1 tablet by mouth every 4 (four) hours as needed.      . isosorbide mononitrate (IMDUR) 60 MG 24 hr tablet Take 2 tablets (120 mg total) by mouth daily.  180 tablet  3  . LANOXIN 0.125 MG tablet TAKE 1 TABLET BY MOUTH DAILY  90 tablet  3  . Multiple Vitamin (MULTIVITAMIN) tablet Take 0.5 tablets by mouth daily.       . nitroGLYCERIN (NITROSTAT) 0.4 MG SL tablet Place 0.4 mg under the tongue every 5 (five) minutes as needed.      . potassium chloride SA (K-DUR,KLOR-CON) 20 MEQ tablet Take 2 tablet in am and 1 tablet in pm  270 tablet  3  . warfarin (COUMADIN) 4 MG tablet Take 1 tablet (4 mg total) by mouth daily. As directed  45 tablet  0     Past Medical History  Diagnosis Date  . Nonischemic cardiomyopathy     Initial dx 1992; nl coronary angios  in 1/03; AICD/bivent. pacemaker - 4/09; EF 20% in '92, 15-20% in '02, 20-25% in '08, 40-45% in 6/10.; digoxin toxicity in 2009; ACE Inhibitor discontinued in 2010 during hospital admission  for low output syndrome  .  Hypertension     04/2011-normal electrolytes  . Chronic kidney disease, stage II (mild)     creatinine of 1.4 - 3/08, 2.1 - 2/09, 1.55 - 3/10, 1.4 - 7/10; 1.63 in 04/2011  . CVA (cerebral vascular accident) 68  . PSVT (paroxysmal supraventricular tachycardia)     s/p radiofrequency ablation  . Gout     uric acid of 10 6/11  . Diabetes mellitus     dietary management   . Anemia     hemoglobin of 11/33 in 11/09; 11/34.6 with MCV of 89 in 10/2010  . Thrombus     left upper extremity; following device insertion  . Fall     in bathroom; 12/10  . DJD (degenerative joint disease)     knees; right hip  . Chronic anticoagulation     Hemoccult-negative in 10/2010    ROS:   All systems reviewed and negative except as noted in the HPI.   Past Surgical History  Procedure Date  . Abdominal hysterectomy   . Pacemaker insertion 4/09    Medtronic: AICD/bi-V pacing     Family History  Problem Relation Age of Onset  . Diabetes Brother   . Heart attack Sister 5    also has  liver problems      History   Social History  . Marital Status: Married    Spouse Name: N/A    Number of Children: N/A  . Years of Education: N/A   Occupational History  . Not on file.   Social History Main Topics  . Smoking status: Never Smoker   . Smokeless tobacco: Never Used     Comment: tobacco use  - no  . Alcohol Use: No  . Drug Use: No  . Sexually Active: Not on file   Other Topics Concern  . Not on file   Social History Narrative   Married, retired, does not get regular exercise, no caffeine.      BP 144/84  Pulse 87  Ht 5\' 11"  (1.803 m)  Wt 190 lb 1.9 oz (86.238 kg)  BMI 26.52 kg/m2  SpO2 97%  Physical Exam:  Well appearing 78 year old woman, NAD HEENT: Unremarkable Neck:  7 cm JVD, no thyromegally Lungs:  Clear with no wheezes, rales, or rhonchi. HEART:  Regular rate rhythm, no murmurs, no rubs, no clicks Abd:  soft, positive bowel sounds, no organomegally, no rebound, no  guarding Ext:  2 plus pulses, no edema, no cyanosis, no clubbing Skin:  No rashes no nodules Neuro:  CN II through XII intact, motor grossly intact   DEVICE  Normal device function.  See PaceArt for details.   Assess/Plan:

## 2012-08-02 NOTE — Assessment & Plan Note (Signed)
She is actually in sinus rhythm today. No change in medical therapy.

## 2012-08-09 ENCOUNTER — Other Ambulatory Visit: Payer: Self-pay | Admitting: Internal Medicine

## 2012-08-17 ENCOUNTER — Ambulatory Visit (INDEPENDENT_AMBULATORY_CARE_PROVIDER_SITE_OTHER): Payer: Medicare Other | Admitting: Cardiology

## 2012-08-17 ENCOUNTER — Encounter: Payer: Self-pay | Admitting: Cardiology

## 2012-08-17 VITALS — BP 112/76 | HR 80 | Ht 66.0 in | Wt 191.0 lb

## 2012-08-17 DIAGNOSIS — I5022 Chronic systolic (congestive) heart failure: Secondary | ICD-10-CM

## 2012-08-17 DIAGNOSIS — I10 Essential (primary) hypertension: Secondary | ICD-10-CM

## 2012-08-17 DIAGNOSIS — Z7901 Long term (current) use of anticoagulants: Secondary | ICD-10-CM

## 2012-08-17 DIAGNOSIS — Z5181 Encounter for therapeutic drug level monitoring: Secondary | ICD-10-CM

## 2012-08-17 DIAGNOSIS — N182 Chronic kidney disease, stage 2 (mild): Secondary | ICD-10-CM

## 2012-08-17 DIAGNOSIS — I482 Chronic atrial fibrillation, unspecified: Secondary | ICD-10-CM

## 2012-08-17 DIAGNOSIS — I4891 Unspecified atrial fibrillation: Secondary | ICD-10-CM

## 2012-08-17 DIAGNOSIS — D649 Anemia, unspecified: Secondary | ICD-10-CM

## 2012-08-17 NOTE — Assessment & Plan Note (Signed)
We will continue to monitor for occult GI blood loss with serial CBCs and FOBT.

## 2012-08-17 NOTE — Progress Notes (Deleted)
Name: [name]    DOB: [birth year]  Age: 78 y.o.  MR#: [id number]       PCP:  Alice Reichert, MD      Insurance: @PAYORNAME @   CC:   No chief complaint on file.  MEDICATION BOTTLES DR Ladona Ridgel 12/2  VS BP 112/76  Pulse 80  Ht 5\' 6"  (1.676 m)  Wt 191 lb (86.637 kg)  BMI 30.83 kg/m2  SpO2 96%  Weights Current Weight  08/17/12 191 lb (86.637 kg)  08/02/12 190 lb 1.9 oz (86.238 kg)  10/13/11 194 lb (87.998 kg)    Blood Pressure  BP Readings from Last 3 Encounters:  08/17/12 112/76  08/02/12 144/84  10/13/11 104/64     Admit date:  (Not on file) Last encounter with RMR:  07/23/2012   Allergy Allergies  Allergen Reactions  . Tuberculin Tests Swelling    Current Outpatient Prescriptions  Medication Sig Dispense Refill  . carvedilol (COREG) 25 MG tablet Take 1 tablet (25 mg total) by mouth 2 (two) times daily.  180 tablet  1  . colchicine 0.6 MG tablet Take 0.6 mg by mouth daily.      . Febuxostat (ULORIC) 80 MG TABS Take 1 tablet by mouth daily.        . furosemide (LASIX) 80 MG tablet TAKE 1 TABLET BY MOUTH DAILY  90 tablet  1  . hydrALAZINE (APRESOLINE) 100 MG tablet TAKE 1 TABLET BY MOUTH THREE TIMES DAILY  270 tablet  0  . HYDROcodone-acetaminophen (NORCO) 7.5-325 MG per tablet Take 1 tablet by mouth every 4 (four) hours as needed.      . isosorbide mononitrate (IMDUR) 60 MG 24 hr tablet Take 2 tablets (120 mg total) by mouth daily.  180 tablet  3  . LANOXIN 0.125 MG tablet TAKE 1 TABLET BY MOUTH DAILY  90 tablet  3  . Multiple Vitamin (MULTIVITAMIN) tablet Take 0.5 tablets by mouth daily.       . nitroGLYCERIN (NITROSTAT) 0.4 MG SL tablet Place 0.4 mg under the tongue every 5 (five) minutes as needed.      . potassium chloride SA (K-DUR,KLOR-CON) 20 MEQ tablet Take 2 tablet in am and 1 tablet in pm  270 tablet  3  . warfarin (COUMADIN) 5 MG tablet Take 5 mg by mouth daily.        Discontinued Meds:    Medications Discontinued During This Encounter   Medication Reason  . warfarin (COUMADIN) 4 MG tablet Error    Patient Active Problem List  Diagnosis  . Gout  . CEREBROVASCULAR ACCIDENT  . RENAL DISEASE, CHRONIC, STAGE II  . UNSTEADY GAIT  . Atrial fibrillation, chronic  . Chronic systolic heart failure  . Nonischemic cardiomyopathy  . Hypertension  . Anemia  . Chronic anticoagulation  . DJD (degenerative joint disease)  . ICD -Medtronic    LABS Office Visit on 08/02/2012  Component Date Value  . DEVICE MODEL ICD 08/02/2012 [id number] H   . DEV-0014ICD 08/02/2012 Lewayne Bunting   M.D.   . Nira Conn 08/02/2012 Y   . DEV-0014LDO 08/02/2012 Lewayne Bunting   M.D.   . Sherlon Handing 08/02/2012 Lewayne Bunting   M.D.   . Sherlon Handing 08/02/2012 Lewayne Bunting   M.D.   . PACEART [facility] NOTES ICD 08/02/2012                     Value:CRT-D device check in office. Thresholds and sensing consistent with previous device measurements. Lead impedance trends stable  over time. 11.2% mode switch episodes recorded, + coumadin. 6 NSVT episodes.   Patient bi-ventricularly pacing >96.1% of the                          time. Device programmed with appropriate safety margins. Optivol and thoracic impedance abnormal 10/25-11/8.   Audible/vibratory alerts demonstrated for patient. No changes made this session.  Patient education completed including shock plan.  ROV 3                          months with the device clinic in [place].  Marland Kitchen CHARGE TIME 08/02/2012 10.51   . AL IMPEDENCE ICD 08/02/2012 520   . LV LEAD IMPEDENCE ICD 08/02/2012 720   . RV LEAD IMPEDENCE ICD 08/02/2012 424   . HV IMPEDENCE 08/02/2012 37/46   . BATTERY VOLTAGE 08/02/2012 2.95   . VF 08/02/2012 0   . FVT 08/02/2012 0   . PACEART VT 08/02/2012 0   . VENTRICULAR PACING ICD 08/02/2012 96.15   . ATRIAL PACING ICD 08/02/2012 5.61   . TOT-0001 08/02/2012 8   . TOT-0002 08/02/2012 0   . TOT-0006 08/02/2012 [id number]   . AL AMPLITUDE 08/02/2012 1.93494   . RV LEAD AMPLITUDE 08/02/2012  7.10976   . LV LEAD THRESHOLD 08/02/2012 1   . RV LEAD THRESHOLD 08/02/2012 1   . BAMS-0001 08/02/2012 170   . TZON-0002FASTVT 08/02/2012 Disabled   . TZON-0008FASTVT 08/02/2012 Off   . TZON-0010FASTVT 08/02/2012 Off   . TZAT-0001FASTVT 08/02/2012 1   . TZAT-0002FASTVT 08/02/2012 N   . TZAT-0012FASTVT 08/02/2012 200   . TZAT-0018FASTVT 08/02/2012 N   . TZAT-0019FASTVT 08/02/2012 8   . TZAT-0020FASTVT 08/02/2012 1.5   . TZST-0001FASTVT 08/02/2012 2   . TZST-0002FASTVT 08/02/2012 N   . TZST-0001FASTVT 08/02/2012 3   . TZST-0002FASTVT 08/02/2012 N   . TZST-0001FASTVT 08/02/2012 4   . TZST-0002FASTVT 08/02/2012 N   . TZST-0001FASTVT 08/02/2012 5   . TZST-0002FASTVT 08/02/2012 N   . TZST-0001FASTVT 08/02/2012 6   . TZST-0002FASTVT 08/02/2012 N   . TZON-0002SLOWVT 08/02/2012 ATP + 4 Shocks   . TZON-0003SLOWVT 08/02/2012 330   . TZON-0004SLOWVT 08/02/2012 16 beats   . TZON-0005SLOWVT 08/02/2012 12 beats   . TZON-0008SLOWVT 08/02/2012 Off   . TZON-0010SLOWVT 08/02/2012 Off   . TZAT-0001SLOWVT 08/02/2012 1   . TZAT-0002SLOWVT 08/02/2012 Y   . TZAT-0003SLOWVT 08/02/2012 Burst   . TZAT-0004SLOWVT 08/02/2012 8   . TZAT-0005SLOWVT 08/02/2012 88   . TZAT-0011SLOWVT 08/02/2012 10   . TZAT-0012SLOWVT 08/02/2012 200   . TZAT-0013SLOWVT 08/02/2012 2   . TZAT-0018SLOWVT 08/02/2012 N   . TZAT-0019SLOWVT 08/02/2012 8   . TZAT-0020SLOWVT 08/02/2012 1.5   . TZAT-0001SLOWVT 08/02/2012 2   . TZAT-0002SLOWVT 08/02/2012 Y   . TZAT-0003SLOWVT 08/02/2012 Ramp   . TZAT-0004SLOWVT 08/02/2012 8   . TZAT-0005SLOWVT 08/02/2012 91   . TZAT-0011SLOWVT 08/02/2012 10   . TZAT-0012SLOWVT 08/02/2012 200   . TZAT-0013SLOWVT 08/02/2012 2   . TZAT-0018SLOWVT 08/02/2012 N   . TZAT-0019SLOWVT 08/02/2012 8   . TZAT-0020SLOWVT 08/02/2012 1.5   . TZST-0001SLOWVT 08/02/2012 3   . TZST-0002SLOWVT 08/02/2012 Y   . TZST-0003SLOWVT 08/02/2012 15   . TZST-0004SLOWVT 08/02/2012 Biphasic   . TZST-0007SLOWVT  08/02/2012 B>AX   . TZST-0001SLOWVT 08/02/2012 4   . TZST-0002SLOWVT 08/02/2012 Y   . TZST-0003SLOWVT 08/02/2012 25   . TZST-0004SLOWVT 08/02/2012 Biphasic   . TZST-0007SLOWVT 08/02/2012 B>AX   .  TZST-0001SLOWVT 08/02/2012 5   . TZST-0002SLOWVT 08/02/2012 Y   . TZST-0003SLOWVT 08/02/2012 35   . TZST-0004SLOWVT 08/02/2012 Biphasic   . TZST-0007SLOWVT 08/02/2012 AX>B   . TZST-0001SLOWVT 08/02/2012 6   . TZST-0002SLOWVT 08/02/2012 Y   . TZST-0003SLOWVT 08/02/2012 35   . TZST-0004SLOWVT 08/02/2012 Biphasic   . TZST-0007SLOWVT 08/02/2012 AX>B   . TZON-0002VSLOWVT 08/02/2012 Monitor   . TZON-0003VSLOWVT 08/02/2012 400   . TZON-0004VSLOWVT 08/02/2012 20 beats   . TZON-0008VSLOWVT 08/02/2012 Off   . Arley Phenix 08/02/2012 Disabled   . TZON-0003ATACH 08/02/2012 350   . TZAT-0001ATACH 08/02/2012 1   . TZAT-0002ATACH 08/02/2012 N   . TZAT-0012ATACH 08/02/2012 150   . TZAT-0018ATACH 08/02/2012 N   . TZAT-0019ATACH 08/02/2012 6   . TZAT-0020ATACH 08/02/2012 1.5   . TZAT-0001ATACH 08/02/2012 2   . TZAT-0002ATACH 08/02/2012 N   . TZAT-0012ATACH 08/02/2012 150   . TZAT-0018ATACH 08/02/2012 N   . TZAT-0019ATACH 08/02/2012 6   . TZAT-0020ATACH 08/02/2012 1.5   . TZAT-0001ATACH 08/02/2012 3   . TZAT-0002ATACH 08/02/2012 N   . TZAT-0012ATACH 08/02/2012 150   . TZAT-0018ATACH 08/02/2012 N   . TZAT-0019ATACH 08/02/2012 6   . TZAT-0020ATACH 08/02/2012 1.5   . TZST-0001ATACH 08/02/2012 4   . [facility] 08/02/2012 N   . TZST-0001ATACH 08/02/2012 5   . [facility] 08/02/2012 N   . TZST-0001ATACH 08/02/2012 6   . [facility] 08/02/2012 N   Clinical Support on 05/26/2012  Component Date Value  . DEVICE MODEL ICD 05/26/2012 [id number] H   . DEV-0014ICD 05/26/2012 Lewayne Bunting   M.D.   . Nira Conn 05/26/2012 Y   . DEV-0014LDO 05/26/2012 Lewayne Bunting   M.D.   . Sherlon Handing 05/26/2012 Lewayne Bunting   M.D.   . Sherlon Handing 05/26/2012 Lewayne Bunting   M.D.   . PACEART [facility] NOTES ICD  05/26/2012                     Value:CRT-D device check in office. Thresholds and sensing consistent with previous device measurements. Lead impedance trends stable over time. Pt in AF 12.1% of time. + Warfarin but pt is waiting for refill from pharmacy. No ventricular arrhythmia episodes                          recorded. Patient bi-ventricularly pacing 94% of the time. Device programmed with appropriate safety margins. Heart failure diagnostics reviewed and trends are stable for patient. Audible/vibratory alerts demonstrated for patient and pt aware to call                          office if heard. VF NID set at 18/24--may consider changing at next visit. Per pt--has had episodes with therapy. No changes made this session. ROV 08-02-12 @ 1045 with GT in [place].  Marland Kitchen CHARGE TIME 05/26/2012 10.51   . AL IMPEDENCE ICD 05/26/2012 496   . LV LEAD IMPEDENCE ICD 05/26/2012 736   . RV LEAD IMPEDENCE ICD 05/26/2012 448   . HV IMPEDENCE 05/26/2012 39/55   . BATTERY VOLTAGE 05/26/2012 2.97   . VF 05/26/2012 0   . FVT 05/26/2012 0   . PACEART VT 05/26/2012 0   . VENTRICULAR PACING ICD 05/26/2012 94.01   . ATRIAL PACING ICD 05/26/2012 3.93   . TOT-0001 05/26/2012 8   . TOT-0002 05/26/2012 0   . TOT-0006 05/26/2012 [id number]   . AL AMPLITUDE 05/26/2012 2.1   . RV LEAD AMPLITUDE 05/26/2012 12.4   .  AL THRESHOLD 05/26/2012 1   . LV LEAD THRESHOLD 05/26/2012 1.5   . RV LEAD THRESHOLD 05/26/2012 1   . BAMS-0001 05/26/2012 170   . TZON-0002FASTVT 05/26/2012 Disabled   . TZON-0008FASTVT 05/26/2012 Off   . TZON-0010FASTVT 05/26/2012 Off   . TZAT-0001FASTVT 05/26/2012 1   . TZAT-0002FASTVT 05/26/2012 N   . TZAT-0012FASTVT 05/26/2012 200   . TZAT-0018FASTVT 05/26/2012 N   . TZAT-0019FASTVT 05/26/2012 8   . TZAT-0020FASTVT 05/26/2012 1.5   . TZST-0001FASTVT 05/26/2012 2   . TZST-0002FASTVT 05/26/2012 N   . TZST-0001FASTVT 05/26/2012 3   . TZST-0002FASTVT 05/26/2012 N   . TZST-0001FASTVT 05/26/2012 4    . TZST-0002FASTVT 05/26/2012 N   . TZST-0001FASTVT 05/26/2012 5   . TZST-0002FASTVT 05/26/2012 N   . TZST-0001FASTVT 05/26/2012 6   . TZST-0002FASTVT 05/26/2012 N   . TZON-0002SLOWVT 05/26/2012 ATP + 4 Shocks   . TZON-0003SLOWVT 05/26/2012 330   . TZON-0004SLOWVT 05/26/2012 16 beats   . TZON-0005SLOWVT 05/26/2012 12 beats   . TZON-0008SLOWVT 05/26/2012 Off   . TZON-0010SLOWVT 05/26/2012 Off   . TZAT-0001SLOWVT 05/26/2012 1   . TZAT-0002SLOWVT 05/26/2012 Y   . TZAT-0003SLOWVT 05/26/2012 Burst   . TZAT-0004SLOWVT 05/26/2012 8   . TZAT-0005SLOWVT 05/26/2012 88   . TZAT-0011SLOWVT 05/26/2012 10   . TZAT-0012SLOWVT 05/26/2012 200   . TZAT-0013SLOWVT 05/26/2012 2   . TZAT-0018SLOWVT 05/26/2012 N   . TZAT-0019SLOWVT 05/26/2012 8   . TZAT-0020SLOWVT 05/26/2012 1.5   . TZAT-0001SLOWVT 05/26/2012 2   . TZAT-0002SLOWVT 05/26/2012 Y   . TZAT-0003SLOWVT 05/26/2012 Ramp   . TZAT-0004SLOWVT 05/26/2012 8   . TZAT-0005SLOWVT 05/26/2012 91   . TZAT-0011SLOWVT 05/26/2012 10   . TZAT-0012SLOWVT 05/26/2012 200   . TZAT-0013SLOWVT 05/26/2012 2   . TZAT-0018SLOWVT 05/26/2012 N   . TZAT-0019SLOWVT 05/26/2012 8   . TZAT-0020SLOWVT 05/26/2012 1.5   . TZST-0001SLOWVT 05/26/2012 3   . TZST-0002SLOWVT 05/26/2012 Y   . TZST-0003SLOWVT 05/26/2012 15   . TZST-0004SLOWVT 05/26/2012 Biphasic   . TZST-0007SLOWVT 05/26/2012 B>AX   . TZST-0001SLOWVT 05/26/2012 4   . TZST-0002SLOWVT 05/26/2012 Y   . TZST-0003SLOWVT 05/26/2012 25   . TZST-0004SLOWVT 05/26/2012 Biphasic   . TZST-0007SLOWVT 05/26/2012 B>AX   . TZST-0001SLOWVT 05/26/2012 5   . TZST-0002SLOWVT 05/26/2012 Y   . TZST-0003SLOWVT 05/26/2012 35   . TZST-0004SLOWVT 05/26/2012 Biphasic   . TZST-0007SLOWVT 05/26/2012 AX>B   . TZST-0001SLOWVT 05/26/2012 6   . TZST-0002SLOWVT 05/26/2012 Y   . TZST-0003SLOWVT 05/26/2012 35   . TZST-0004SLOWVT 05/26/2012 Biphasic   . TZST-0007SLOWVT 05/26/2012 AX>B   . TZON-0002VSLOWVT 05/26/2012 Monitor   .  TZON-0003VSLOWVT 05/26/2012 400   . TZON-0004VSLOWVT 05/26/2012 20 beats   . TZON-0008VSLOWVT 05/26/2012 Off   . Arley Phenix 05/26/2012 Disabled   . TZON-0003ATACH 05/26/2012 350   . TZAT-0001ATACH 05/26/2012 1   . TZAT-0002ATACH 05/26/2012 N   . TZAT-0012ATACH 05/26/2012 150   . TZAT-0018ATACH 05/26/2012 N   . TZAT-0019ATACH 05/26/2012 6   . TZAT-0020ATACH 05/26/2012 1.5   . TZAT-0001ATACH 05/26/2012 2   . TZAT-0002ATACH 05/26/2012 N   . TZAT-0012ATACH 05/26/2012 150   . TZAT-0018ATACH 05/26/2012 N   . TZAT-0019ATACH 05/26/2012 6   . TZAT-0020ATACH 05/26/2012 1.5   . TZAT-0001ATACH 05/26/2012 3   . TZAT-0002ATACH 05/26/2012 N   . TZAT-0012ATACH 05/26/2012 150   . TZAT-0018ATACH 05/26/2012 N   . TZAT-0019ATACH 05/26/2012 6   . TZAT-0020ATACH 05/26/2012 1.5   . TZST-0001ATACH 05/26/2012 4   . [facility] 05/26/2012 N   . TZST-0001ATACH 05/26/2012 5   .  [facility] 05/26/2012 N   . TZST-0001ATACH 05/26/2012 6   . [facility] 05/26/2012 N      Results for this Opt Visit:     Results for orders placed in visit on 08/02/12  ICD DEVICE OBSERVATION      Component Value Range   DEVICE MODEL ICD [id number]     DEV-0014ICD Lewayne Bunting   M.D.     DEV-0020ICD Y     DEV-0014LDO Lewayne Bunting   M.D.     [id number] Lewayne Bunting   M.D.     [id number] Lewayne Bunting   M.D.     [facility] NOTES ICD       Value: CRT-D device check in office. Thresholds and sensing consistent with previous device measurements. Lead impedance trends stable over time. 11.2% mode switch episodes recorded, + coumadin. 6 NSVT episodes.   Patient bi-ventricularly pacing >96.1% of the      time. Device programmed with appropriate safety margins. Optivol and thoracic impedance abnormal 10/25-11/8.   Audible/vibratory alerts demonstrated for patient. No changes made this session.  Patient education completed including shock plan.  ROV 3      months with the device clinic in [place].   CHARGE TIME 10.51      AL IMPEDENCE ICD 520     LV LEAD IMPEDENCE ICD 720     RV LEAD IMPEDENCE ICD 424     HV IMPEDENCE 37/46     BATTERY VOLTAGE 2.95     VF 0     FVT 0     PACEART VT 0     VENTRICULAR PACING ICD 96.15     ATRIAL PACING ICD 5.61     TOT-0001 8     TOT-0002 0     TOT-0006 [id number]     AL AMPLITUDE 1.93494     RV LEAD AMPLITUDE 7.10976     LV LEAD THRESHOLD 1     RV LEAD THRESHOLD 1     BAMS-0001 170     TZON-0002FASTVT Disabled     TZON-0008FASTVT Off     TZON-0010FASTVT Off     TZAT-0001FASTVT 1     TZAT-0002FASTVT N     TZAT-0004FASTVT       TZAT-0012FASTVT 200     TZAT-0018FASTVT N     TZAT-0019FASTVT 8     TZAT-0020FASTVT 1.5     TZST-0001FASTVT 2     TZST-0002FASTVT N     TZST-0001FASTVT 3     TZST-0002FASTVT N     TZST-0001FASTVT 4     TZST-0002FASTVT N     TZST-0001FASTVT 5     TZST-0002FASTVT N     TZST-0001FASTVT 6     TZST-0002FASTVT N     TZON-0002SLOWVT ATP + 4 Shocks     TZON-0003SLOWVT 330     TZON-0004SLOWVT 16 beats     TZON-0005SLOWVT 12 beats     TZON-0008SLOWVT Off     TZON-0010SLOWVT Off     TZAT-0001SLOWVT 1     TZAT-0002SLOWVT Y     TZAT-0003SLOWVT Burst     TZAT-0004SLOWVT 8     TZAT-0005SLOWVT 88     TZAT-0011SLOWVT 10     TZAT-0012SLOWVT 200     TZAT-0013SLOWVT 2     TZAT-0018SLOWVT N     TZAT-0019SLOWVT 8     TZAT-0020SLOWVT 1.5     TZAT-0001SLOWVT 2     TZAT-0002SLOWVT Y     TZAT-0003SLOWVT Ramp     TZAT-0004SLOWVT 8     TZAT-0005SLOWVT 91  TZAT-0011SLOWVT 10     TZAT-0012SLOWVT 200     TZAT-0013SLOWVT 2     TZAT-0018SLOWVT N     TZAT-0019SLOWVT 8     TZAT-0020SLOWVT 1.5     TZST-0001SLOWVT 3     TZST-0002SLOWVT Y     TZST-0003SLOWVT 15     TZST-0004SLOWVT Biphasic     TZST-0007SLOWVT B>AX     TZST-0001SLOWVT 4     TZST-0002SLOWVT Y     TZST-0003SLOWVT 25     TZST-0004SLOWVT Biphasic     TZST-0007SLOWVT B>AX     TZST-0001SLOWVT 5     TZST-0002SLOWVT Y     TZST-0003SLOWVT 35     TZST-0004SLOWVT Biphasic      TZST-0007SLOWVT AX>B     TZST-0001SLOWVT 6     TZST-0002SLOWVT Y     TZST-0003SLOWVT 35     TZST-0004SLOWVT Biphasic     TZST-0007SLOWVT AX>B     TZON-0002VSLOWVT Monitor     TZON-0003VSLOWVT 400     TZON-0004VSLOWVT 20 beats     TZON-0008VSLOWVT Off     TZON-0002ATACH Disabled     TZON-0003ATACH 350     TZAT-0001ATACH 1     TZAT-0002ATACH N     TZAT-0004ATACH       TZAT-0012ATACH 150     TZAT-0018ATACH N     TZAT-0019ATACH 6     TZAT-0020ATACH 1.5     TZAT-0001ATACH 2     TZAT-0002ATACH N     TZAT-0004ATACH       TZAT-0012ATACH 150     TZAT-0018ATACH N     TZAT-0019ATACH 6     TZAT-0020ATACH 1.5     TZAT-0001ATACH 3     TZAT-0002ATACH N     TZAT-0004ATACH       TZAT-0012ATACH 150     TZAT-0018ATACH N     TZAT-0019ATACH 6     TZAT-0020ATACH 1.5     TZST-0001ATACH 4     TZST-0002ATACH N     TZST-0001ATACH 5     TZST-0002ATACH N     TZST-0001ATACH 6     TZST-0002ATACH N      EKG Orders placed in visit on 08/09/12  . EKG 12-LEAD     Prior Assessment and Plan Problem List as of 08/17/2012            Cardiology Problems   CEREBROVASCULAR ACCIDENT   Last Assessment & Plan Note   10/13/2011 Office Visit Signed 10/13/2011  2:44 PM by Kathlen Brunswick, MD    No symptoms to suggest progression of cerebrovascular disease.  Normal carotid ultrasound study 4 years ago.  Will defer repeat imaging at present.    Atrial fibrillation, chronic   Last Assessment & Plan Note   08/02/2012 Office Visit Signed 08/02/2012 10:09 AM by Marinus Maw, MD    She is actually in sinus rhythm today. No change in medical therapy.    Chronic systolic heart failure   Last Assessment & Plan Note   08/02/2012 Office Visit Signed 08/02/2012 10:09 AM by Marinus Maw, MD    Her congestive heart failure symptoms remain class II. She will continue her current medical therapy. I've instructed the patient to maintain a low-sodium diet.    Nonischemic cardiomyopathy   Last Assessment & Plan  Note   10/13/2011 Office Visit Signed 10/13/2011  2:50 PM by Kathlen Brunswick, MD    She continues to be remarkably stable with compensated CHF and a cardiomyopathic process that has been present for at least 20 years.  Current medical therapy appears optimal.    Hypertension   Last Assessment & Plan Note   10/13/2011 Office Visit Signed 10/13/2011  2:45 PM by Kathlen Brunswick, MD    Blood pressure control is excellent.  Current medications will be continued.      Other   Gout   Last Assessment & Plan Note   04/14/2011 Office Visit Signed 04/15/2011  9:48 AM by Kathlen Brunswick, MD    No recent episodes nor assessment of uric acid level.    RENAL DISEASE, CHRONIC, STAGE II   Last Assessment & Plan Note   10/13/2011 Office Visit Signed 10/13/2011  2:59 PM by Kathlen Brunswick, MD    Mild to moderate and stable chronic kidney disease.  Patient is tolerating medications for chronic CHF without significant deterioration in renal function although she has not been treated with an ACE inhibitor.  Digoxin dosage is appropriate to degree of renal dysfunction.    UNSTEADY GAIT   Anemia   Last Assessment & Plan Note   10/13/2011 Office Visit Signed 10/13/2011  2:40 PM by Kathlen Brunswick, MD    Mild chronic anemia, likely representing anemia of chronic disease.  Multiple evaluations within the past few years have shown negative stool Hemoccults and no evidence for iron deficiency.    Chronic anticoagulation   Last Assessment & Plan Note   10/13/2011 Office Visit Signed 10/13/2011  2:44 PM by Kathlen Brunswick, MD    Mild anemia does not appear related to anticoagulation.  Risk of thromboembolism is substantial, and treatment with full dose warfarin will be continued.    DJD (degenerative joint disease)   Last Assessment & Plan Note   04/14/2011 Office Visit Signed 04/15/2011  9:47 AM by Kathlen Brunswick, MD    There has been spontaneous improvement in knee and hip pain.  She is getting around fairly  well and need not consider surgical options at this time.    ICD -Medtronic   Last Assessment & Plan Note   08/02/2012 Office Visit Signed 08/02/2012 10:09 AM by Marinus Maw, MD    Her Medtronic biventricular ICD is working normally. No change in outputs. We'll plan to recheck in several months.        Imaging: No results found.   FRS Calculation: Score not calculated. Missing: Total Cholesterol

## 2012-08-17 NOTE — Assessment & Plan Note (Signed)
She has been well compensated throughout most of her multi-decade history of cardiomyopathy and continues to be so.  ACE Inhibitor was discontinued during a hospitalization and never restarted.  Since she has done well on hydralazine and nitrates, this will continue to constitute the mainstay of her treatment for cardiomyopathy with a history of CHF.

## 2012-08-17 NOTE — Assessment & Plan Note (Signed)
I have no assessment of renal function for the past year.  A chemistry profile has been ordered.

## 2012-08-17 NOTE — Patient Instructions (Addendum)
Your physician recommends that you schedule a follow-up appointment in: 7 months  Stool Cards and return to office as soon as possible  Your physician recommends that you return for lab work in: within the week

## 2012-08-17 NOTE — Assessment & Plan Note (Signed)
No symptoms referable to atrial fibrillation; heart rate control remains good; anticoagulation has been well-tolerated and apparently effective.

## 2012-08-17 NOTE — Progress Notes (Signed)
Patient ID: [name], female   DOB: [birth year], 78 y.o.   MRN: [id number]  HPI: Scheduled return visit for this lovely older woman who has had long-standing nonischemic cardiomyopathy.  Although she was extremely ill during a hospitalization in 2010, she has otherwise done extremely well, generally been asymptomatic.  Most recently, she maintains her usual level of activity without difficulty.  She has not been hospitalized or required evaluation in the emergency department.  Her last significant episode of gout was more than one year ago, but she believes that her maintenance medications are necessary, as she occasionally experiences mild symptoms.  Prior to Admission medications   Medication Sig Start Date End Date Taking? Authorizing Provider  carvedilol (COREG) 25 MG tablet Take 1 tablet (25 mg total) by mouth 2 (two) times daily. 11/28/10  Yes Kathlen Brunswick, MD  colchicine 0.6 MG tablet Take 0.6 mg by mouth daily.   Yes Historical Provider, MD  Febuxostat (ULORIC) 80 MG TABS Take 1 tablet by mouth daily.     Yes Historical Provider, MD  furosemide (LASIX) 80 MG tablet TAKE 1 TABLET BY MOUTH DAILY 06/07/12  Yes Kathlen Brunswick, MD  hydrALAZINE (APRESOLINE) 100 MG tablet TAKE 1 TABLET BY MOUTH THREE TIMES DAILY 07/16/12  Yes Kathlen Brunswick, MD  HYDROcodone-acetaminophen (NORCO) 7.5-325 MG per tablet Take 1 tablet by mouth every 4 (four) hours as needed.   Yes Historical Provider, MD  isosorbide mononitrate (IMDUR) 60 MG 24 hr tablet Take 2 tablets (120 mg total) by mouth daily. 10/13/11  Yes Kathlen Brunswick, MD  LANOXIN 0.125 MG tablet TAKE 1 TABLET BY MOUTH DAILY 07/23/12  Yes Kathlen Brunswick, MD  Multiple Vitamin (MULTIVITAMIN) tablet Take 0.5 tablets by mouth daily.    Yes Historical Provider, MD  nitroGLYCERIN (NITROSTAT) 0.4 MG SL tablet Place 0.4 mg under the tongue every 5 (five) minutes as needed.   Yes Historical Provider, MD  potassium chloride SA (K-DUR,KLOR-CON) 20  MEQ tablet Take 2 tablet in am and 1 tablet in pm 02/19/12  Yes Kathlen Brunswick, MD  warfarin (COUMADIN) 5 MG tablet Take 5 mg by mouth daily.   Yes Historical Provider, MD   Allergies  Allergen Reactions  . Tuberculin Tests Swelling  Past medical history, social history, and family history reviewed and updated.  ROS: Denies chest pain, dyspnea, orthopnea, PND, palpitations, lightheadedness or syncope.  All other systems reviewed and are negative.  PHYSICAL EXAM: BP 112/76  Pulse 80  Ht 5\' 6"  (1.676 m)  Wt 86.637 kg (191 lb)  BMI 30.83 kg/m2  SpO2 96%  General-Well developed; no acute distress Body habitus-Moderately overweight Neck-No JVD; no carotid bruits Lungs-clear lung fields; resonant to percussion Cardiovascular-normal PMI; normal S1 and S2; modest systolic murmur Abdomen-normal bowel sounds; soft and non-tender without masses or organomegaly Musculoskeletal-No deformities, no cyanosis or clubbing Neurologic-Normal cranial nerves; symmetric strength and tone Skin-Warm, no significant lesions Extremities-distal pulses intact; 1/2+ ankle edema  ASSESSMENT AND PLAN:   Bing, MD 08/17/2012 2:49 PM

## 2012-08-17 NOTE — Assessment & Plan Note (Signed)
Findings in the past have been consistent with anemia of chronic disease.  A repeat CBC will be obtained.

## 2012-08-17 NOTE — Assessment & Plan Note (Addendum)
No elevated diastolic or systolic blood pressures have been recorded in the past 3 years.  Control of hypertension appears excellent with current medication.

## 2012-08-18 ENCOUNTER — Encounter: Payer: Self-pay | Admitting: *Deleted

## 2012-08-18 ENCOUNTER — Encounter: Payer: Self-pay | Admitting: Cardiology

## 2012-08-18 LAB — COMPREHENSIVE METABOLIC PANEL
Alkaline Phosphatase: 94 U/L (ref 39–117)
Glucose, Bld: 92 mg/dL (ref 70–99)
Sodium: 141 mEq/L (ref 135–145)
Total Bilirubin: 0.5 mg/dL (ref 0.3–1.2)
Total Protein: 6.5 g/dL (ref 6.0–8.3)

## 2012-08-18 LAB — CBC
HCT: 35 % — ABNORMAL LOW (ref 36.0–46.0)
Hemoglobin: 11.4 g/dL — ABNORMAL LOW (ref 12.0–15.0)
MCH: 28.6 pg (ref 26.0–34.0)
MCV: 87.9 fL (ref 78.0–100.0)
Platelets: 260 10*3/uL (ref 150–400)
RBC: 3.98 MIL/uL (ref 3.87–5.11)

## 2012-08-18 LAB — DIGOXIN LEVEL: Digoxin Level: 1.3 ng/mL (ref 0.8–2.0)

## 2012-09-02 ENCOUNTER — Other Ambulatory Visit (HOSPITAL_COMMUNITY): Payer: Self-pay | Admitting: Family Medicine

## 2012-09-02 ENCOUNTER — Other Ambulatory Visit: Payer: Self-pay | Admitting: Cardiology

## 2012-09-02 DIAGNOSIS — Z139 Encounter for screening, unspecified: Secondary | ICD-10-CM

## 2012-09-02 DIAGNOSIS — M810 Age-related osteoporosis without current pathological fracture: Secondary | ICD-10-CM

## 2012-09-09 ENCOUNTER — Ambulatory Visit (HOSPITAL_COMMUNITY)
Admission: RE | Admit: 2012-09-09 | Discharge: 2012-09-09 | Disposition: A | Discharge status: Home / Self Care | Payer: Medicare Other | Source: Ambulatory Visit | Attending: Family Medicine | Admitting: Family Medicine

## 2012-09-09 DIAGNOSIS — M810 Age-related osteoporosis without current pathological fracture: Secondary | ICD-10-CM

## 2012-09-09 DIAGNOSIS — Z139 Encounter for screening, unspecified: Secondary | ICD-10-CM

## 2012-09-09 DIAGNOSIS — R29890 Loss of height: Secondary | ICD-10-CM | POA: Insufficient documentation

## 2012-09-09 DIAGNOSIS — M818 Other osteoporosis without current pathological fracture: Secondary | ICD-10-CM | POA: Insufficient documentation

## 2012-09-09 DIAGNOSIS — Z780 Asymptomatic menopausal state: Secondary | ICD-10-CM | POA: Insufficient documentation

## 2012-09-09 DIAGNOSIS — Z1231 Encounter for screening mammogram for malignant neoplasm of breast: Secondary | ICD-10-CM | POA: Insufficient documentation

## 2012-10-29 ENCOUNTER — Ambulatory Visit (INDEPENDENT_AMBULATORY_CARE_PROVIDER_SITE_OTHER): Payer: Medicare Other | Admitting: *Deleted

## 2012-10-29 DIAGNOSIS — Z7901 Long term (current) use of anticoagulants: Secondary | ICD-10-CM

## 2012-10-29 DIAGNOSIS — Z48810 Encounter for surgical aftercare following surgery on the sense organs: Secondary | ICD-10-CM

## 2012-10-29 LAB — POC HEMOCCULT BLD/STL (HOME/3-CARD/SCREEN)
Card #3 Fecal Occult Blood, POC: NEGATIVE
Fecal Occult Blood, POC: NEGATIVE

## 2012-10-30 ENCOUNTER — Other Ambulatory Visit: Payer: Self-pay | Admitting: Cardiology

## 2012-11-29 ENCOUNTER — Other Ambulatory Visit: Payer: Self-pay | Admitting: Internal Medicine

## 2012-11-29 ENCOUNTER — Encounter: Payer: Self-pay | Admitting: Internal Medicine

## 2012-11-29 ENCOUNTER — Ambulatory Visit (INDEPENDENT_AMBULATORY_CARE_PROVIDER_SITE_OTHER): Payer: Medicare Other | Admitting: *Deleted

## 2012-11-29 DIAGNOSIS — I428 Other cardiomyopathies: Secondary | ICD-10-CM

## 2012-11-29 DIAGNOSIS — I482 Chronic atrial fibrillation, unspecified: Secondary | ICD-10-CM

## 2012-11-29 DIAGNOSIS — I4891 Unspecified atrial fibrillation: Secondary | ICD-10-CM

## 2012-11-29 DIAGNOSIS — I5022 Chronic systolic (congestive) heart failure: Secondary | ICD-10-CM

## 2012-11-29 LAB — ICD DEVICE OBSERVATION
AL AMPLITUDE: 0.9254 mv
ATRIAL PACING ICD: 4.66 pct
BAMS-0001: 170 {beats}/min
BATTERY VOLTAGE: 2.88 V
FVT: 0
LV LEAD IMPEDENCE ICD: 592 Ohm
LV LEAD THRESHOLD: 1.5 V
RV LEAD AMPLITUDE: 14.7274 mv
RV LEAD IMPEDENCE ICD: 424 Ohm
RV LEAD THRESHOLD: 0.5 V
TOT-0006: 20090504000000
TZAT-0001ATACH: 2
TZAT-0002ATACH: NEGATIVE
TZAT-0004SLOWVT: 8
TZAT-0004SLOWVT: 8
TZAT-0011SLOWVT: 10 ms
TZAT-0011SLOWVT: 10 ms
TZAT-0012ATACH: 150 ms
TZAT-0012FASTVT: 200 ms
TZAT-0012SLOWVT: 200 ms
TZAT-0018ATACH: NEGATIVE
TZAT-0018FASTVT: NEGATIVE
TZAT-0019ATACH: 6 V
TZAT-0019ATACH: 6 V
TZAT-0019FASTVT: 8 V
TZAT-0019SLOWVT: 8 V
TZAT-0019SLOWVT: 8 V
TZAT-0020ATACH: 1.5 ms
TZAT-0020ATACH: 1.5 ms
TZAT-0020ATACH: 1.5 ms
TZAT-0020FASTVT: 1.5 ms
TZAT-0020SLOWVT: 1.5 ms
TZAT-0020SLOWVT: 1.5 ms
TZON-0003SLOWVT: 330 ms
TZON-0003VSLOWVT: 400 ms
TZON-0004VSLOWVT: 20
TZST-0001FASTVT: 2
TZST-0001FASTVT: 5
TZST-0001FASTVT: 6
TZST-0001SLOWVT: 5
TZST-0002ATACH: NEGATIVE
TZST-0002FASTVT: NEGATIVE
TZST-0002FASTVT: NEGATIVE
TZST-0003SLOWVT: 15 J
TZST-0003SLOWVT: 35 J
VF: 1

## 2012-11-29 NOTE — Progress Notes (Signed)
ICD check in clinic. Normal function.1 treated VT episode with successful shock. ROV in 3 mths w/device clinic.

## 2013-01-18 ENCOUNTER — Encounter (INDEPENDENT_AMBULATORY_CARE_PROVIDER_SITE_OTHER): Payer: Self-pay | Admitting: *Deleted

## 2013-01-25 ENCOUNTER — Other Ambulatory Visit: Payer: Self-pay | Admitting: Cardiology

## 2013-02-23 ENCOUNTER — Encounter: Payer: Self-pay | Admitting: Internal Medicine

## 2013-02-23 ENCOUNTER — Ambulatory Visit (INDEPENDENT_AMBULATORY_CARE_PROVIDER_SITE_OTHER): Payer: Medicare Other | Admitting: Internal Medicine

## 2013-02-23 VITALS — BP 160/80 | HR 57 | Ht 65.0 in | Wt 176.0 lb

## 2013-02-23 DIAGNOSIS — I428 Other cardiomyopathies: Secondary | ICD-10-CM

## 2013-02-23 DIAGNOSIS — I5022 Chronic systolic (congestive) heart failure: Secondary | ICD-10-CM

## 2013-02-23 LAB — ICD DEVICE OBSERVATION
AL IMPEDENCE ICD: 568 Ohm
BATTERY VOLTAGE: 2.81 V
LV LEAD IMPEDENCE ICD: 680 Ohm
LV LEAD THRESHOLD: 1.5 V
PACEART VT: 0
RV LEAD AMPLITUDE: 15.2 mv
TOT-0002: 0
TOT-0006: 20090504000000
TZAT-0001ATACH: 2
TZAT-0001ATACH: 3
TZAT-0001SLOWVT: 1
TZAT-0001SLOWVT: 2
TZAT-0002ATACH: NEGATIVE
TZAT-0002ATACH: NEGATIVE
TZAT-0002FASTVT: NEGATIVE
TZAT-0004SLOWVT: 8
TZAT-0004SLOWVT: 8
TZAT-0005SLOWVT: 88 pct
TZAT-0005SLOWVT: 91 pct
TZAT-0012ATACH: 150 ms
TZAT-0018ATACH: NEGATIVE
TZAT-0018ATACH: NEGATIVE
TZAT-0018FASTVT: NEGATIVE
TZAT-0018SLOWVT: NEGATIVE
TZAT-0018SLOWVT: NEGATIVE
TZAT-0019ATACH: 6 V
TZAT-0019ATACH: 6 V
TZAT-0019FASTVT: 8 V
TZAT-0020ATACH: 1.5 ms
TZAT-0020SLOWVT: 1.5 ms
TZON-0003VSLOWVT: 400 ms
TZON-0004VSLOWVT: 20
TZST-0001ATACH: 6
TZST-0001FASTVT: 5
TZST-0001FASTVT: 6
TZST-0001SLOWVT: 3
TZST-0001SLOWVT: 5
TZST-0001SLOWVT: 6
TZST-0002ATACH: NEGATIVE
TZST-0002FASTVT: NEGATIVE
TZST-0002FASTVT: NEGATIVE
TZST-0003SLOWVT: 35 J

## 2013-02-23 NOTE — Patient Instructions (Addendum)
Your physician recommends that you schedule a follow-up appointment in: ONE YEAR WITH DR Ladona Ridgel AND 3 MONTH DEVICE CHECK

## 2013-02-23 NOTE — Progress Notes (Signed)
HPI [name] returns today for followup.she is a very pleasant 80 year old woman with a nonischemic cardiomyopathy, now persistent atrial fibrillation, chronic systolic heart failure, left bundle branch block, status post biventricular ICD implantation. The patient has developed increasingly persistent atrial fibrillation over the last year. Despite this, her ventricular rate has been well-controlled and her heart failure symptoms are very mild. She remains active cooking, and cleaning inside her house. She denies chest pain, shortness of breath, peripheral edema, or ICD shock.  Allergies  Allergen Reactions  . Tuberculin Tests Swelling     Current Outpatient Prescriptions  Medication Sig Dispense Refill  . atorvastatin (LIPITOR) 20 MG tablet Take 20 mg by mouth daily.      . colchicine 0.6 MG tablet Take 0.6 mg by mouth daily.      . Febuxostat (ULORIC) 80 MG TABS Take 1 tablet by mouth daily.        . furosemide (LASIX) 80 MG tablet TAKE 1 TABLET DAILY  90 tablet  3  . hydrALAZINE (APRESOLINE) 100 MG tablet TAKE 1 TABLET BY MOUTH THREE TIMES DAILY  270 tablet  0  . HYDROcodone-acetaminophen (NORCO) 7.5-325 MG per tablet Take 1 tablet by mouth every 4 (four) hours as needed.      . isosorbide mononitrate (IMDUR) 60 MG 24 hr tablet TAKE 2 TABLETS (120MG ) BY MOUTH DAILY  180 tablet  0  . LANOXIN 0.125 MG tablet TAKE 1 TABLET BY MOUTH DAILY  90 tablet  3  . metFORMIN (GLUCOPHAGE) 500 MG tablet Take 500 mg by mouth 2 (two) times daily with a meal.      . Multiple Vitamin (MULTIVITAMIN) tablet Take 0.5 tablets by mouth daily.       . potassium chloride SA (K-DUR,KLOR-CON) 20 MEQ tablet TAKE 2 TABLETS IN THE MORNING AND TAKE 1 TABLET IN THE EVENING  270 tablet  1  . warfarin (COUMADIN) 5 MG tablet Take 5 mg by mouth daily.      . carvedilol (COREG) 25 MG tablet Take 1 tablet (25 mg total) by mouth 2 (two) times daily.  180 tablet  1  . nitroGLYCERIN (NITROSTAT) 0.4 MG SL tablet Place 0.4 mg  under the tongue every 5 (five) minutes as needed.       No current facility-administered medications for this visit.     Past Medical History  Diagnosis Date  . Nonischemic cardiomyopathy     Initial dx 1992; nl coronary angios  in 1/03; AICD/bivent. pacemaker - 4/09; EF 20% in '92, 15-20% in '02, 20-25% in '08, 40-45% in 6/10.; digoxin toxicity in 2009; ACE Inhibitor discontinued in 2010 during hospital admission  for low output syndrome  . Hypertension     04/2011-normal electrolytes  . Chronic kidney disease, stage II (mild)     creatinine of 1.4 - 3/08, 2.1 - 2/09, 1.55 - 3/10, 1.4 - 7/10; 1.63 in 04/2011  . CVA (cerebral vascular accident) 58  . PSVT (paroxysmal supraventricular tachycardia)     s/p radiofrequency ablation  . Gout     uric acid of 10 6/11  . Diabetes mellitus     dietary management   . Anemia     hemoglobin of 11/33 in 11/09; 11/34.6 with MCV of 89 in 10/2010  . Thrombus     left upper extremity; following device insertion  . Fall     in bathroom; 12/10  . DJD (degenerative joint disease)     knees; right hip  . Chronic anticoagulation  Hemoccult-negative in 10/2010    ROS:   All systems reviewed and negative except as noted in the HPI.   Past Surgical History  Procedure Laterality Date  . Abdominal hysterectomy    . Pacemaker insertion  4/09    Medtronic: AICD/bi-V pacing     Family History  Problem Relation Age of Onset  . Diabetes Brother   . Heart attack Sister 33    also has liver problems      History   Social History  . Marital Status: Married    Spouse Name: N/A    Number of Children: N/A  . Years of Education: N/A   Occupational History  . Not on file.   Social History Main Topics  . Smoking status: Never Smoker   . Smokeless tobacco: Never Used     Comment: tobacco use  - no  . Alcohol Use: No  . Drug Use: No  . Sexually Active: Not on file   Other Topics Concern  . Not on file   Social History Narrative    Married, retired, does not get regular exercise, no caffeine.      BP 160/80  Pulse 57  Ht 5\' 5"  (1.651 m)  Wt 176 lb (79.833 kg)  BMI 29.29 kg/m2  Physical Exam:  Well appearing 79 year old woman,NAD HEENT: Unremarkable Neck:  6 cm JVD, no thyromegally Back:  No CVA tenderness Lungs:  Clear with no wheezes, rales, or rhonchi. HEART:  IRegular rate rhythm, no murmurs, no rubs, no clicks Abd:  soft, positive bowel sounds, no organomegally, no rebound, no guarding Ext:  2 plus pulses, no edema, no cyanosis, no clubbing Skin:  No rashes no nodules Neuro:  CN II through XII intact, motor grossly intact  EKG - atrial fibrillation with a controlled ventricular response and intermittent biventricular pacing  DEVICE  Normal device function.  See PaceArt for details.   Assess/Plan:

## 2013-03-21 ENCOUNTER — Telehealth: Payer: Self-pay | Admitting: *Deleted

## 2013-03-21 ENCOUNTER — Ambulatory Visit: Payer: Medicare Other | Admitting: Cardiology

## 2013-03-21 MED ORDER — DIGOXIN 125 MCG PO TABS: 0.1250 mg | ORAL_TABLET | Freq: Every day | ORAL | Status: DC

## 2013-03-21 NOTE — Telephone Encounter (Signed)
rx sent to pharmacy by e-script  

## 2013-03-21 NOTE — Telephone Encounter (Signed)
LANOXIN 0.125 MG tablet RX #[id number] [facility]

## 2013-03-28 ENCOUNTER — Telehealth: Payer: Self-pay | Admitting: *Deleted

## 2013-03-28 MED ORDER — ISOSORBIDE MONONITRATE ER 60 MG PO TB24: ORAL_TABLET | ORAL | Status: DC

## 2013-03-28 NOTE — Telephone Encounter (Signed)
PT NEEDS ISOSORBIDE CALLED IN TO [facility]

## 2013-03-29 ENCOUNTER — Encounter: Payer: Self-pay | Admitting: Adult Health

## 2013-03-29 ENCOUNTER — Ambulatory Visit (INDEPENDENT_AMBULATORY_CARE_PROVIDER_SITE_OTHER): Payer: Medicare Other | Admitting: Adult Health

## 2013-03-29 VITALS — BP 130/62 | HR 57 | Ht 66.0 in | Wt 186.0 lb

## 2013-03-29 DIAGNOSIS — I428 Other cardiomyopathies: Secondary | ICD-10-CM

## 2013-03-29 DIAGNOSIS — I10 Essential (primary) hypertension: Secondary | ICD-10-CM

## 2013-03-29 DIAGNOSIS — I5022 Chronic systolic (congestive) heart failure: Secondary | ICD-10-CM

## 2013-03-29 MED ORDER — POTASSIUM CHLORIDE CRYS ER 20 MEQ PO TBCR: EXTENDED_RELEASE_TABLET | ORAL | Status: DC

## 2013-03-29 MED ORDER — DIGOXIN 125 MCG PO TABS: 0.1250 mg | ORAL_TABLET | Freq: Every day | ORAL | Status: DC

## 2013-03-29 MED ORDER — HYDRALAZINE HCL 100 MG PO TABS: ORAL_TABLET | ORAL | Status: DC

## 2013-03-29 NOTE — Progress Notes (Signed)
HPI: [name] is a 79 year old female patient of Dr. Dietrich Pates we are following for ongoing assessment and management of long-standing nonischemic cardiomyopathy, with history of atrial fibrillation, left bundle branch block, status post biventricular ICD implantation. systolic heart failure, and hypertension. He was last seen by Dr. Dietrich Pates in December of 2013 for annual visit. He remained on hydralazine and nitrates, ACE inhibitor was discontinued on hospitalization one year ago. He was last seen by Dr. Sharrell Ku for ICD interrogation and management in June of 2014. Most recent echocardiogram was completed in 2010 the estimated ejection fraction was in the range of 20% to 25%.Diffuse hypokinesis.    She is without complaint today. No chest pain, heart racing, no shortness of breath.. She is not taking coreg as this had been discontinued. She is followed by Dr. Renard Matter for coumadin dosing.    Allergies  Allergen Reactions  . Tuberculin Tests Swelling    Current Outpatient Prescriptions  Medication Sig Dispense Refill  . atorvastatin (LIPITOR) 20 MG tablet Take 20 mg by mouth daily.      . colchicine 0.6 MG tablet Take 0.6 mg by mouth daily.      . digoxin (LANOXIN) 0.125 MG tablet Take 1 tablet (0.125 mg total) by mouth daily.  90 tablet  2  . Febuxostat (ULORIC) 80 MG TABS Take 1 tablet by mouth daily.        . furosemide (LASIX) 80 MG tablet TAKE 1 TABLET DAILY  90 tablet  3  . hydrALAZINE (APRESOLINE) 100 MG tablet TAKE 1 TABLET BY MOUTH THREE TIMES DAILY  270 tablet  0  . HYDROcodone-acetaminophen (NORCO) 7.5-325 MG per tablet Take 1 tablet by mouth every 4 (four) hours as needed.      . metFORMIN (GLUCOPHAGE) 500 MG tablet Take 500 mg by mouth 2 (two) times daily with a meal.      . Multiple Vitamin (MULTIVITAMIN) tablet Take 0.5 tablets by mouth daily.       . nitroGLYCERIN (NITROSTAT) 0.4 MG SL tablet Place 0.4 mg under the tongue every 5 (five) minutes as needed.      .  potassium chloride SA (K-DUR,KLOR-CON) 20 MEQ tablet TAKE 2 TABLETS IN THE MORNING AND TAKE 1 TABLET IN THE EVENING  270 tablet  1  . warfarin (COUMADIN) 5 MG tablet Take 5 mg by mouth daily.      . carvedilol (COREG) 25 MG tablet Take 1 tablet (25 mg total) by mouth 2 (two) times daily.  180 tablet  1  . isosorbide mononitrate (IMDUR) 60 MG 24 hr tablet TAKE 2 TABLETS (120MG ) BY MOUTH DAILY  180 tablet  0   No current facility-administered medications for this visit.    Past Medical History  Diagnosis Date  . Nonischemic cardiomyopathy     Initial dx 1992; nl coronary angios  in 1/03; AICD/bivent. pacemaker - 4/09; EF 20% in '92, 15-20% in '02, 20-25% in '08, 40-45% in 6/10.; digoxin toxicity in 2009; ACE Inhibitor discontinued in 2010 during hospital admission  for low output syndrome  . Hypertension     04/2011-normal electrolytes  . Chronic kidney disease, stage II (mild)     creatinine of 1.4 - 3/08, 2.1 - 2/09, 1.55 - 3/10, 1.4 - 7/10; 1.63 in 04/2011  . CVA (cerebral vascular accident) 53  . PSVT (paroxysmal supraventricular tachycardia)     s/p radiofrequency ablation  . Gout     uric acid of 10 6/11  . Diabetes mellitus  dietary management   . Anemia     hemoglobin of 11/33 in 11/09; 11/34.6 with MCV of 89 in 10/2010  . Thrombus     left upper extremity; following device insertion  . Fall     in bathroom; 12/10  . DJD (degenerative joint disease)     knees; right hip  . Chronic anticoagulation     Hemoccult-negative in 10/2010    Past Surgical History  Procedure Laterality Date  . Abdominal hysterectomy    . Pacemaker insertion  4/09    Medtronic: AICD/bi-V pacing    ZDG:LOVFIE of systems complete and found to be negative unless listed above  PHYSICAL EXAM BP 130/62  Pulse 57  Ht 5\' 6"  (1.676 m)  Wt 186 lb (84.369 kg)  BMI 30.04 kg/m2  General: Well developed, well nourished, in no acute distress Head: Eyes PERRLA, No xanthomas.   Normal cephalic and  atramatic  Lungs: Clear bilaterally to auscultation and percussion. Heart: HRRR S1 S2, without MRG.  Pulses are 2+ & equal.            No carotid bruit. No JVD.  No abdominal bruits. No femoral bruits. Abdomen: Bowel sounds are positive, abdomen soft and non-tender without masses or                  Hernia's noted. Msk:  Back normal, normal gait. Normal strength and tone for age. Extremities: No clubbing, cyanosis or edema.  DP +1 Neuro: Alert and oriented X 3. Psych:  Good affect, responds appropriately    ASSESSMENT AND PLAN

## 2013-03-29 NOTE — Progress Notes (Deleted)
Name: [name]    DOB: 03/22/1933  Age: 79 y.o.  MR#: [id number]       PCP:  Alice Reichert, MD      Insurance: Payor: [employer] MEDICARE / Plan: [employer] MEDICARE COMPLETE / Product Type: *No Product type* /   CC:   No chief complaint on file.   VS Filed Vitals:   03/29/13 1316  BP: 130/62  Pulse: 57  Height: 5\' 6"  (1.676 m)  Weight: 186 lb (84.369 kg)    Weights Current Weight  03/29/13 186 lb (84.369 kg)  02/23/13 176 lb (79.833 kg)  08/17/12 191 lb (86.637 kg)    Blood Pressure  BP Readings from Last 3 Encounters:  03/29/13 130/62  02/23/13 160/80  08/17/12 112/76     Admit date:  (Not on file) Last encounter with RMR:  Visit date not found   Allergy Tuberculin tests  Current Outpatient Prescriptions  Medication Sig Dispense Refill  . atorvastatin (LIPITOR) 20 MG tablet Take 20 mg by mouth daily.      . colchicine 0.6 MG tablet Take 0.6 mg by mouth daily.      . digoxin (LANOXIN) 0.125 MG tablet Take 1 tablet (0.125 mg total) by mouth daily.  90 tablet  2  . Febuxostat (ULORIC) 80 MG TABS Take 1 tablet by mouth daily.        . furosemide (LASIX) 80 MG tablet TAKE 1 TABLET DAILY  90 tablet  3  . hydrALAZINE (APRESOLINE) 100 MG tablet TAKE 1 TABLET BY MOUTH THREE TIMES DAILY  270 tablet  0  . HYDROcodone-acetaminophen (NORCO) 7.5-325 MG per tablet Take 1 tablet by mouth every 4 (four) hours as needed.      . metFORMIN (GLUCOPHAGE) 500 MG tablet Take 500 mg by mouth 2 (two) times daily with a meal.      . Multiple Vitamin (MULTIVITAMIN) tablet Take 0.5 tablets by mouth daily.       . nitroGLYCERIN (NITROSTAT) 0.4 MG SL tablet Place 0.4 mg under the tongue every 5 (five) minutes as needed.      . potassium chloride SA (K-DUR,KLOR-CON) 20 MEQ tablet TAKE 2 TABLETS IN THE MORNING AND TAKE 1 TABLET IN THE EVENING  270 tablet  1  . warfarin (COUMADIN) 5 MG tablet Take 5 mg by mouth daily.      . carvedilol (COREG) 25 MG tablet Take 1 tablet (25 mg total) by  mouth 2 (two) times daily.  180 tablet  1  . isosorbide mononitrate (IMDUR) 60 MG 24 hr tablet TAKE 2 TABLETS (120MG ) BY MOUTH DAILY  180 tablet  0   No current facility-administered medications for this visit.    Discontinued Meds:   There are no discontinued medications.  Patient Active Problem List   Diagnosis Date Noted  . ICD -Medtronic 08/07/2011  . Nonischemic cardiomyopathy   . Hypertension   . Anemia, normocytic normochromic   . Chronic anticoagulation   . DJD (degenerative joint disease)   . Atrial fibrillation, chronic 08/20/2010  . Chronic systolic heart failure 08/20/2010  . Chronic kidney disease, stage III (moderate) 02/27/2009  . Gout 02/12/2009  . CEREBROVASCULAR ACCIDENT 02/01/2009    LABS    Component Value Date/Time   NA 141 08/17/2012 1525   NA 140 08/13/2011 1130   NA 144 06/10/2011 1100   K 3.8 08/17/2012 1525   K 3.8 08/13/2011 1130   K 4.0 06/10/2011 1100   CL 103 08/17/2012 1525   CL  101 08/13/2011 1130   CL 108 06/10/2011 1100   CO2 28 08/17/2012 1525   CO2 27 08/13/2011 1130   CO2 24 06/10/2011 1100   GLUCOSE 92 08/17/2012 1525   GLUCOSE 109* 08/13/2011 1130   GLUCOSE 96 06/10/2011 1100   BUN 31* 08/17/2012 1525   BUN 34* 08/13/2011 1130   BUN 24* 06/10/2011 1100   CREATININE 1.66* 08/17/2012 1525   CREATININE 1.78* 08/13/2011 1130   CREATININE 1.53* 06/10/2011 1100   CREATININE 1.70* 11/06/2010 1724   CREATININE 1.43* 08/06/2010 0014   CREATININE 1.62* 05/31/2010 2311   CALCIUM 8.8 08/17/2012 1525   CALCIUM 9.0 08/13/2011 1130   CALCIUM 9.0 06/10/2011 1100   GFRNONAA 36* 02/21/2009 0350   GFRNONAA 33* 02/20/2009 0345   GFRNONAA 35* 02/19/2009 0540   GFRAA  Value: 43        The eGFR has been calculated using the MDRD equation. This calculation has not been validated in all clinical situations. eGFR's persistently <60 mL/min signify possible Chronic Kidney Disease.* 02/21/2009 0350   GFRAA  Value: 40        The eGFR has been calculated using the  MDRD equation. This calculation has not been validated in all clinical situations. eGFR's persistently <60 mL/min signify possible Chronic Kidney Disease.* 02/20/2009 0345   GFRAA  Value: 43        The eGFR has been calculated using the MDRD equation. This calculation has not been validated in all clinical situations. eGFR's persistently <60 mL/min signify possible Chronic Kidney Disease.* 02/19/2009 0540   CMP     Component Value Date/Time   NA 141 08/17/2012 1525   K 3.8 08/17/2012 1525   CL 103 08/17/2012 1525   CO2 28 08/17/2012 1525   GLUCOSE 92 08/17/2012 1525   BUN 31* 08/17/2012 1525   CREATININE 1.66* 08/17/2012 1525   CREATININE 1.70* 11/06/2010 1724   CALCIUM 8.8 08/17/2012 1525   PROT 6.5 08/17/2012 1525   ALBUMIN 4.0 08/17/2012 1525   AST 25 08/17/2012 1525   ALT 17 08/17/2012 1525   ALKPHOS 94 08/17/2012 1525   BILITOT 0.5 08/17/2012 1525   GFRNONAA 36* 02/21/2009 0350   GFRAA  Value: 43        The eGFR has been calculated using the MDRD equation. This calculation has not been validated in all clinical situations. eGFR's persistently <60 mL/min signify possible Chronic Kidney Disease.* 02/21/2009 0350       Component Value Date/Time   WBC 5.8 08/17/2012 1525   WBC 5.3 11/06/2010 1724   WBC 5.8 05/31/2010 2311   HGB 11.4* 08/17/2012 1525   HGB 11.0* 11/06/2010 1724   HGB 11.2* 05/31/2010 2311   HCT 35.0* 08/17/2012 1525   HCT 34.6* 11/06/2010 1724   HCT 35.0* 05/31/2010 2311   MCV 87.9 08/17/2012 1525   MCV 88.9 11/06/2010 1724   MCV 90.2 05/31/2010 2311    Lipid Panel     Component Value Date/Time   CHOL 230* 10/08/2009 2126   TRIG 190* 10/08/2009 2126   HDL 37* 10/08/2009 2126   CHOLHDL 6.2 Ratio 10/08/2009 2126   VLDL 38 10/08/2009 2126   LDLCALC 155* 10/08/2009 2126    ABG    Component Value Date/Time   PHART 7.427* 09/30/2007 1221   PCO2ART 34.5* 09/30/2007 1221   PO2ART 83.8 09/30/2007 1221   HCO3 21.4 02/19/2009 1619   TCO2 22 02/19/2009 1619   ACIDBASEDEF 3.0* 02/19/2009  1619   O2SAT 65.0 02/19/2009 1619  Lab Results  Component Value Date   TSH 1.417 02/05/2010   BNP (last 3 results) No results found for this basename: PROBNP,  in the last 8760 hours Cardiac Panel (last 3 results) No results found for this basename: CKTOTAL, CKMB, TROPONINI, RELINDX,  in the last 72 hours  Iron/TIBC/Ferritin    Component Value Date/Time   IRON 36* 02/04/2009 0400   TIBC 219* 02/04/2009 0400   FERRITIN 288 02/04/2009 0400     EKG Orders placed in visit on 02/23/13  . EKG 12-LEAD     Prior Assessment and Plan Problem List as of 03/29/2013     Cardiovascular and Mediastinum   CEREBROVASCULAR ACCIDENT   Last Assessment & Plan   10/13/2011 Office Visit Written 10/13/2011  2:44 PM by Kathlen Brunswick, MD     No symptoms to suggest progression of cerebrovascular disease.  Normal carotid ultrasound study 4 years ago.  Will defer repeat imaging at present.    Atrial fibrillation, chronic   Last Assessment & Plan   08/17/2012 Office Visit Written 08/17/2012  2:57 PM by Kathlen Brunswick, MD     No symptoms referable to atrial fibrillation; heart rate control remains good; anticoagulation has been well-tolerated and apparently effective.    Chronic systolic heart failure   Last Assessment & Plan   08/17/2012 Office Visit Written 08/17/2012  3:00 PM by Kathlen Brunswick, MD     She has been well compensated throughout most of her multi-decade history of cardiomyopathy and continues to be so.  ACE Inhibitor was discontinued during a hospitalization and never restarted.  Since she has done well on hydralazine and nitrates, this will continue to constitute the mainstay of her treatment for cardiomyopathy with a history of CHF.    Nonischemic cardiomyopathy   Last Assessment & Plan   10/13/2011 Office Visit Written 10/13/2011  2:50 PM by Kathlen Brunswick, MD     She continues to be remarkably stable with compensated CHF and a cardiomyopathic process that has been present for at  least 20 years.  Current medical therapy appears optimal.    Hypertension   Last Assessment & Plan   08/17/2012 Office Visit Edited 08/17/2012  4:54 PM by Kathlen Brunswick, MD     No elevated diastolic or systolic blood pressures have been recorded in the past 3 years.  Control of hypertension appears excellent with current medication.      Musculoskeletal and Integument   DJD (degenerative joint disease)   Last Assessment & Plan   04/14/2011 Office Visit Written 04/15/2011  9:47 AM by Kathlen Brunswick, MD     There has been spontaneous improvement in knee and hip pain.  She is getting around fairly well and need not consider surgical options at this time.      Genitourinary   Chronic kidney disease, stage III (moderate)   Last Assessment & Plan   08/17/2012 Office Visit Written 08/17/2012  4:56 PM by Kathlen Brunswick, MD     I have no assessment of renal function for the past year.  A chemistry profile has been ordered.      Other   Gout   Last Assessment & Plan   04/14/2011 Office Visit Written 04/15/2011  9:48 AM by Kathlen Brunswick, MD     No recent episodes nor assessment of uric acid level.    Anemia, normocytic normochromic   Last Assessment & Plan   08/17/2012 Office Visit Written 08/17/2012  2:57 PM  by Kathlen Brunswick, MD     Findings in the past have been consistent with anemia of chronic disease.  A repeat CBC will be obtained.    Chronic anticoagulation   Last Assessment & Plan   08/17/2012 Office Visit Written 08/17/2012  2:58 PM by Kathlen Brunswick, MD     We will continue to monitor for occult GI blood loss with serial CBCs and FOBT.    ICD -Medtronic   Last Assessment & Plan   08/02/2012 Office Visit Written 08/02/2012 10:09 AM by Marinus Maw, MD     Her Medtronic biventricular ICD is working normally. No change in outputs. We'll plan to recheck in several months.        Imaging: No results found.

## 2013-03-29 NOTE — Assessment & Plan Note (Signed)
Excellent control of BP at present.  Will continue current medical therapy.

## 2013-03-29 NOTE — Patient Instructions (Addendum)
Your physician recommends that you schedule a follow-up appointment in: 6 MONTHS You will receive a reminder letter in the mail in about 4 months reminding you to call and schedule your appointment. If you don't receive this letter, please contact our office.  Your physician recommends that you continue on your current medications as directed. Please refer to the Current Medication list given to you today.  Your physician has requested that you have an echocardiogram. Echocardiography is a painless test that uses sound waves to create images of your heart. It provides your doctor with information about the size and shape of your heart and how well your heart's chambers and valves are working. This procedure takes approximately one hour. There are no restrictions for this procedure.

## 2013-03-29 NOTE — Assessment & Plan Note (Signed)
Will repeat her echo to evaluate LV fx. She will continue to hold coreg until I revaluate her status. HR is slow on digoxin in the 50's. She will continue to follow with Dr. Ladona Ridgel for ICD checks. She will be seen again in 6 months unless symptomatic.

## 2013-04-05 ENCOUNTER — Ambulatory Visit (HOSPITAL_COMMUNITY)
Admission: RE | Admit: 2013-04-05 | Discharge: 2013-04-05 | Disposition: A | Discharge status: Home / Self Care | Payer: Medicare Other | Source: Ambulatory Visit | Attending: Adult Health | Admitting: Adult Health

## 2013-04-05 DIAGNOSIS — I5022 Chronic systolic (congestive) heart failure: Secondary | ICD-10-CM | POA: Insufficient documentation

## 2013-04-05 DIAGNOSIS — I4891 Unspecified atrial fibrillation: Secondary | ICD-10-CM | POA: Insufficient documentation

## 2013-04-05 DIAGNOSIS — I429 Cardiomyopathy, unspecified: Secondary | ICD-10-CM | POA: Insufficient documentation

## 2013-04-05 DIAGNOSIS — I10 Essential (primary) hypertension: Secondary | ICD-10-CM | POA: Insufficient documentation

## 2013-04-05 DIAGNOSIS — I059 Rheumatic mitral valve disease, unspecified: Secondary | ICD-10-CM

## 2013-04-05 DIAGNOSIS — I079 Rheumatic tricuspid valve disease, unspecified: Secondary | ICD-10-CM | POA: Insufficient documentation

## 2013-04-05 DIAGNOSIS — I379 Nonrheumatic pulmonary valve disorder, unspecified: Secondary | ICD-10-CM | POA: Insufficient documentation

## 2013-04-05 DIAGNOSIS — I428 Other cardiomyopathies: Secondary | ICD-10-CM

## 2013-04-21 NOTE — Progress Notes (Signed)
*  PRELIMINARY RESULTS* Echocardiogram 2D Echocardiogram has been performed.  Conrad Tradewinds 04/21/2013, 8:05 AM

## 2013-05-24 ENCOUNTER — Ambulatory Visit (INDEPENDENT_AMBULATORY_CARE_PROVIDER_SITE_OTHER): Payer: Medicare Other | Admitting: *Deleted

## 2013-05-24 DIAGNOSIS — I5022 Chronic systolic (congestive) heart failure: Secondary | ICD-10-CM

## 2013-05-24 DIAGNOSIS — I428 Other cardiomyopathies: Secondary | ICD-10-CM

## 2013-05-24 LAB — ICD DEVICE OBSERVATION
AL AMPLITUDE: 1.3461 mv
ATRIAL PACING ICD: 12.04 pct
BAMS-0001: 170 {beats}/min
BATTERY VOLTAGE: 2.71 V
FVT: 0
LV LEAD THRESHOLD: 1 V
RV LEAD AMPLITUDE: 15.0659 mv
RV LEAD IMPEDENCE ICD: 416 Ohm
RV LEAD THRESHOLD: 1 V
TOT-0006: 20090504000000
TZAT-0001ATACH: 2
TZAT-0002ATACH: NEGATIVE
TZAT-0004SLOWVT: 8
TZAT-0004SLOWVT: 8
TZAT-0011SLOWVT: 10 ms
TZAT-0011SLOWVT: 10 ms
TZAT-0012ATACH: 150 ms
TZAT-0012FASTVT: 200 ms
TZAT-0012SLOWVT: 200 ms
TZAT-0012SLOWVT: 200 ms
TZAT-0018ATACH: NEGATIVE
TZAT-0018FASTVT: NEGATIVE
TZAT-0019ATACH: 6 V
TZAT-0019FASTVT: 8 V
TZAT-0019SLOWVT: 8 V
TZAT-0019SLOWVT: 8 V
TZAT-0020ATACH: 1.5 ms
TZAT-0020ATACH: 1.5 ms
TZAT-0020ATACH: 1.5 ms
TZAT-0020FASTVT: 1.5 ms
TZAT-0020SLOWVT: 1.5 ms
TZAT-0020SLOWVT: 1.5 ms
TZON-0003ATACH: 350 ms
TZON-0003SLOWVT: 330 ms
TZON-0003VSLOWVT: 400 ms
TZON-0004VSLOWVT: 20
TZST-0001FASTVT: 2
TZST-0001FASTVT: 5
TZST-0001FASTVT: 6
TZST-0001SLOWVT: 5
TZST-0002ATACH: NEGATIVE
TZST-0002FASTVT: NEGATIVE
TZST-0003SLOWVT: 15 J
TZST-0003SLOWVT: 35 J
VF: 0

## 2013-05-24 NOTE — Progress Notes (Signed)
ICD check with ICM in office. 

## 2013-06-04 ENCOUNTER — Encounter: Payer: Self-pay | Admitting: Internal Medicine

## 2013-06-08 ENCOUNTER — Other Ambulatory Visit: Payer: Self-pay | Admitting: Adult Health

## 2013-06-09 ENCOUNTER — Other Ambulatory Visit: Payer: Self-pay | Admitting: Cardiology

## 2013-08-12 ENCOUNTER — Ambulatory Visit (INDEPENDENT_AMBULATORY_CARE_PROVIDER_SITE_OTHER): Payer: Medicare Other | Admitting: *Deleted

## 2013-08-12 DIAGNOSIS — I428 Other cardiomyopathies: Secondary | ICD-10-CM

## 2013-08-12 DIAGNOSIS — I5022 Chronic systolic (congestive) heart failure: Secondary | ICD-10-CM

## 2013-08-12 LAB — MDC_IDC_ENUM_SESS_TYPE_INCLINIC
Battery Voltage: 2.64 V
Date Time Interrogation Session: 20141212173854
HighPow Impedance: 47 Ohm
Lead Channel Impedance Value: 592 Ohm
Lead Channel Pacing Threshold Amplitude: 1.5 V
Lead Channel Setting Pacing Amplitude: 2.5 V
Lead Channel Setting Pacing Pulse Width: 0.4 ms
Zone Setting Detection Interval: 350 ms
Zone Setting Detection Interval: 400 ms

## 2013-08-12 NOTE — Progress Notes (Signed)
ICD check with ICM in office. 

## 2013-08-22 ENCOUNTER — Other Ambulatory Visit: Payer: Self-pay | Admitting: Adult Health

## 2013-08-30 ENCOUNTER — Encounter: Payer: Self-pay | Admitting: Internal Medicine

## 2013-10-21 ENCOUNTER — Encounter: Payer: Medicare Other | Admitting: Internal Medicine

## 2013-10-30 ENCOUNTER — Other Ambulatory Visit: Payer: Self-pay | Admitting: Adult Health

## 2013-10-30 ENCOUNTER — Other Ambulatory Visit: Payer: Self-pay | Admitting: Cardiology

## 2013-11-02 ENCOUNTER — Other Ambulatory Visit: Payer: Self-pay | Admitting: Adult Health

## 2013-11-28 ENCOUNTER — Encounter: Payer: Self-pay | Admitting: Internal Medicine

## 2013-11-28 ENCOUNTER — Encounter: Payer: Self-pay | Admitting: *Deleted

## 2013-11-28 ENCOUNTER — Ambulatory Visit (INDEPENDENT_AMBULATORY_CARE_PROVIDER_SITE_OTHER): Payer: Medicare HMO | Admitting: Internal Medicine

## 2013-11-28 VITALS — BP 103/73 | HR 102 | Ht 68.0 in | Wt 179.0 lb

## 2013-11-28 DIAGNOSIS — I5022 Chronic systolic (congestive) heart failure: Secondary | ICD-10-CM

## 2013-11-28 DIAGNOSIS — Z95810 Presence of automatic (implantable) cardiac defibrillator: Secondary | ICD-10-CM

## 2013-11-28 DIAGNOSIS — I10 Essential (primary) hypertension: Secondary | ICD-10-CM

## 2013-11-28 DIAGNOSIS — I428 Other cardiomyopathies: Secondary | ICD-10-CM

## 2013-11-28 LAB — MDC_IDC_ENUM_SESS_TYPE_INCLINIC
Battery Voltage: 2.62 V
Brady Statistic AS VS Percent: 33.5 %
Brady Statistic RV Percent Paced: 64.23 %
Date Time Interrogation Session: 20150330102043
HIGH POWER IMPEDANCE MEASURED VALUE: 46 Ohm
HighPow Impedance: 36 Ohm
Lead Channel Impedance Value: 408 Ohm
Lead Channel Impedance Value: 600 Ohm
Lead Channel Pacing Threshold Amplitude: 2 V
Lead Channel Pacing Threshold Pulse Width: 0.4 ms
Lead Channel Pacing Threshold Pulse Width: 0.8 ms
Lead Channel Setting Pacing Amplitude: 2 V
Lead Channel Setting Pacing Amplitude: 3 V
Lead Channel Setting Pacing Pulse Width: 0.4 ms
Lead Channel Setting Sensing Sensitivity: 0.45 mV
MDC IDC MSMT LEADCHNL RA SENSING INTR AMPL: 1.5984
MDC IDC MSMT LEADCHNL RV PACING THRESHOLD AMPLITUDE: 0.5 V
MDC IDC MSMT LEADCHNL RV SENSING INTR AMPL: 6.4326
MDC IDC SET LEADCHNL LV PACING PULSEWIDTH: 0.8 ms
MDC IDC SET LEADCHNL RV PACING AMPLITUDE: 2.5 V
MDC IDC STAT BRADY AP VP PERCENT: 17.96 %
MDC IDC STAT BRADY AP VS PERCENT: 2.27 %
MDC IDC STAT BRADY AS VP PERCENT: 46.27 %
MDC IDC STAT BRADY RA PERCENT PACED: 20.23 %
Zone Setting Detection Interval: 280 ms
Zone Setting Detection Interval: 330 ms
Zone Setting Detection Interval: 350 ms
Zone Setting Detection Interval: 400 ms

## 2013-11-28 MED ORDER — ATORVASTATIN CALCIUM 20 MG PO TABS: 20.0000 mg | ORAL_TABLET | Freq: Every day | ORAL | Status: DC

## 2013-11-28 MED ORDER — METOPROLOL TARTRATE 25 MG PO TABS: 12.5000 mg | ORAL_TABLET | Freq: Two times a day (BID) | ORAL | Status: DC

## 2013-11-28 NOTE — Assessment & Plan Note (Signed)
Her blood pressure is well controlled. Will follow. Hopefully she will be able to tolerate the addition of a beta blocker.

## 2013-11-28 NOTE — Addendum Note (Signed)
Addended by: Thompson Grayer on: 11/28/2013 10:55 AM   Modules accepted: Orders

## 2013-11-28 NOTE — Assessment & Plan Note (Signed)
Her symptoms remain class 2. I have asked the patient to maintain a low sodium diet. I would like to start her on low dose metoprolol with uptitration in a month.

## 2013-11-28 NOTE — Assessment & Plan Note (Signed)
She is approaching ERI. We will follow her closely.

## 2013-11-28 NOTE — Progress Notes (Signed)
HPI [name] returns today for followup.she is a very pleasant 80-year-old woman with a nonischemic cardiomyopathy, now persistent atrial fibrillation, chronic systolic heart failure, left bundle branch block, status post biventricular ICD implantation. The patient has developed increasingly persistent atrial fibrillation over the last year. Despite this, her ventricular rate has been fairly well-controlled and her heart failure symptoms are very mild. She remains active cooking, and cleaning inside her house. She denies chest pain, shortness of breath, peripheral edema, or ICD shock.  She is approaching ERI on her device.  Allergies  Allergen Reactions  . Tuberculin Tests Swelling     Current Outpatient Prescriptions  Medication Sig Dispense Refill  . atorvastatin (LIPITOR) 20 MG tablet Take 1 tablet (20 mg total) by mouth daily.  90 tablet  3  . colchicine 0.6 MG tablet Take 0.6 mg by mouth daily.      . digoxin (LANOXIN) 0.125 MG tablet Take 1 tablet (0.125 mg total) by mouth daily.  90 tablet  2  . furosemide (LASIX) 80 MG tablet TAKE 1 TABLET DAILY  90 tablet  2  . hydrALAZINE (APRESOLINE) 100 MG tablet TAKE 1 TABLET THREE TIMES A DAY  270 tablet  3  . HYDROcodone-acetaminophen (NORCO) 7.5-325 MG per tablet Take 1 tablet by mouth every 4 (four) hours as needed.      . isosorbide mononitrate (IMDUR) 60 MG 24 hr tablet TAKE 2 TABLETS (120 MG) DAILY  180 tablet  0  . Multiple Vitamin (MULTIVITAMIN) tablet Take 0.5 tablets by mouth daily.       . nitroGLYCERIN (NITROSTAT) 0.4 MG SL tablet Place 0.4 mg under the tongue every 5 (five) minutes as needed.      . potassium chloride SA (K-DUR,KLOR-CON) 20 MEQ tablet TAKE 2 TABLETS IN THE MORNING AND 1 TABLET IN THE EVENING  270 tablet  6  . warfarin (COUMADIN) 5 MG tablet Take 5 mg by mouth daily.       No current facility-administered medications for this visit.     Past Medical History  Diagnosis Date  . Nonischemic cardiomyopathy    Initial dx 1992; nl coronary angios  in 1/03; AICD/bivent. pacemaker - 4/09; EF 20% in '92, 15-20% in '02, 20-25% in '08, 40-45% in 6/10.; digoxin toxicity in 2009; ACE Inhibitor discontinued in 2010 during hospital admission  for low output syndrome  . Hypertension     04/2011-normal electrolytes  . Chronic kidney disease, stage II (mild)     creatinine of 1.4 - 3/08, 2.1 - 2/09, 1.55 - 3/10, 1.4 - 7/10; 1.63 in 04/2011  . CVA (cerebral vascular accident) 1992  . PSVT (paroxysmal supraventricular tachycardia)     s/p radiofrequency ablation  . Gout     uric acid of 10 6/11  . Diabetes mellitus     dietary management   . Anemia     hemoglobin of 11/33 in 11/09; 11/34.6 with MCV of 89 in 10/2010  . Thrombus     left upper extremity; following device insertion  . Fall     in bathroom; 12/10  . DJD (degenerative joint disease)     knees; right hip  . Chronic anticoagulation     Hemoccult-negative in 10/2010    ROS:   All systems reviewed and negative except as noted in the HPI.   Past Surgical History  Procedure Laterality Date  . Abdominal hysterectomy    . Pacemaker insertion  4/09    Medtronic: AICD/bi-V pacing     Family   History  Problem Relation Age of Onset  . Diabetes Brother   . Heart attack Sister 39    also has liver problems      History   Social History  . Marital Status: Married    Spouse Name: N/A    Number of Children: N/A  . Years of Education: N/A   Occupational History  . Not on file.   Social History Main Topics  . Smoking status: Never Smoker   . Smokeless tobacco: Never Used     Comment: tobacco use  - no  . Alcohol Use: No  . Drug Use: No  . Sexual Activity: Not on file   Other Topics Concern  . Not on file   Social History Narrative   Married, retired, does not get regular exercise, no caffeine.      BP 103/73  Pulse 102  Ht 5\' 8"  (1.727 m)  Wt 179 lb (81.194 kg)  BMI 27.22 kg/m2  Physical Exam:  Well appearing  80 year old woman,NAD HEENT: Unremarkable Neck:  6 cm JVD, no thyromegally Back:  No CVA tenderness Lungs:  Clear with no wheezes, rales, or rhonchi. HEART:  IRegular rate rhythm, no murmurs, no rubs, no clicks Abd:  soft, positive bowel sounds, no organomegally, no rebound, no guarding Ext:  2 plus pulses, no edema, no cyanosis, no clubbing Skin:  No rashes no nodules Neuro:  CN II through XII intact, motor grossly intact   DEVICE  Normal device function.  See PaceArt for details. BiV Pacing approx. 65%  Assess/Plan:

## 2013-11-28 NOTE — Patient Instructions (Addendum)
Your physician recommends that you schedule a follow-up appointment in: 3 months with Dr Court Joy are scheduled for ICD changeout on December 12, 2013 at 10:00am PLease arrive at Short stay at [facility] at 8:00am   Your physician has recommended you make the following change in your medication:  Start Metoprolol 12.5 mg twice a day for one month, then we will increase to 25 mg twice a day.

## 2013-11-29 ENCOUNTER — Encounter (HOSPITAL_COMMUNITY): Payer: Self-pay | Admitting: Pharmacy Technician

## 2013-12-10 LAB — BASIC METABOLIC PANEL
BUN: 33 mg/dL — AB (ref 6–23)
CALCIUM: 9 mg/dL (ref 8.4–10.5)
CHLORIDE: 109 meq/L (ref 96–112)
CO2: 25 meq/L (ref 19–32)
Creat: 1.97 mg/dL — ABNORMAL HIGH (ref 0.50–1.10)
GLUCOSE: 94 mg/dL (ref 70–99)
Potassium: 3.8 mEq/L (ref 3.5–5.3)
Sodium: 145 mEq/L (ref 135–145)

## 2013-12-10 LAB — CBC
HEMATOCRIT: 32.3 % — AB (ref 36.0–46.0)
HEMOGLOBIN: 10.7 g/dL — AB (ref 12.0–15.0)
MCH: 28.3 pg (ref 26.0–34.0)
MCHC: 33.1 g/dL (ref 30.0–36.0)
MCV: 85.4 fL (ref 78.0–100.0)
PLATELETS: 231 10*3/uL (ref 150–400)
RBC: 3.78 MIL/uL — AB (ref 3.87–5.11)
RDW: 14.8 % (ref 11.5–15.5)
WBC: 5.2 10*3/uL (ref 4.0–10.5)

## 2013-12-10 LAB — PROTIME-INR
INR: 2.58 — ABNORMAL HIGH (ref <1.50)
Prothrombin Time: 27 seconds — ABNORMAL HIGH (ref 11.6–15.2)

## 2013-12-10 LAB — APTT: APTT: 46 s — AB (ref 24–37)

## 2013-12-11 MED ORDER — SODIUM CHLORIDE 0.9 % IR SOLN
80.0000 mg | Status: AC
  Filled 2013-12-11: qty 2

## 2013-12-11 MED ORDER — CEFAZOLIN SODIUM-DEXTROSE 2-3 GM-% IV SOLR: 2.0000 g | INTRAVENOUS | Status: AC

## 2013-12-12 ENCOUNTER — Ambulatory Visit (HOSPITAL_COMMUNITY)
Admission: RE | Admit: 2013-12-12 | Discharge: 2013-12-12 | Disposition: A | Discharge status: Home / Self Care | Payer: Medicare HMO | Source: Ambulatory Visit | Attending: Internal Medicine | Admitting: Internal Medicine

## 2013-12-12 ENCOUNTER — Encounter (HOSPITAL_COMMUNITY)
Admission: RE | Disposition: A | Discharge status: Home / Self Care | Payer: Self-pay | Source: Ambulatory Visit | Attending: Internal Medicine

## 2013-12-12 DIAGNOSIS — I471 Supraventricular tachycardia, unspecified: Secondary | ICD-10-CM | POA: Insufficient documentation

## 2013-12-12 DIAGNOSIS — I442 Atrioventricular block, complete: Secondary | ICD-10-CM | POA: Insufficient documentation

## 2013-12-12 DIAGNOSIS — Z4502 Encounter for adjustment and management of automatic implantable cardiac defibrillator: Secondary | ICD-10-CM | POA: Insufficient documentation

## 2013-12-12 DIAGNOSIS — N182 Chronic kidney disease, stage 2 (mild): Secondary | ICD-10-CM | POA: Insufficient documentation

## 2013-12-12 DIAGNOSIS — Z8673 Personal history of transient ischemic attack (TIA), and cerebral infarction without residual deficits: Secondary | ICD-10-CM | POA: Insufficient documentation

## 2013-12-12 DIAGNOSIS — I129 Hypertensive chronic kidney disease with stage 1 through stage 4 chronic kidney disease, or unspecified chronic kidney disease: Secondary | ICD-10-CM | POA: Insufficient documentation

## 2013-12-12 DIAGNOSIS — Z86718 Personal history of other venous thrombosis and embolism: Secondary | ICD-10-CM | POA: Insufficient documentation

## 2013-12-12 DIAGNOSIS — M1610 Unilateral primary osteoarthritis, unspecified hip: Secondary | ICD-10-CM | POA: Insufficient documentation

## 2013-12-12 DIAGNOSIS — I428 Other cardiomyopathies: Secondary | ICD-10-CM

## 2013-12-12 DIAGNOSIS — M169 Osteoarthritis of hip, unspecified: Secondary | ICD-10-CM | POA: Insufficient documentation

## 2013-12-12 DIAGNOSIS — Z7901 Long term (current) use of anticoagulants: Secondary | ICD-10-CM | POA: Insufficient documentation

## 2013-12-12 DIAGNOSIS — I5022 Chronic systolic (congestive) heart failure: Secondary | ICD-10-CM | POA: Insufficient documentation

## 2013-12-12 DIAGNOSIS — I509 Heart failure, unspecified: Secondary | ICD-10-CM | POA: Insufficient documentation

## 2013-12-12 DIAGNOSIS — Z9181 History of falling: Secondary | ICD-10-CM | POA: Insufficient documentation

## 2013-12-12 DIAGNOSIS — Z79899 Other long term (current) drug therapy: Secondary | ICD-10-CM | POA: Insufficient documentation

## 2013-12-12 DIAGNOSIS — E119 Type 2 diabetes mellitus without complications: Secondary | ICD-10-CM | POA: Insufficient documentation

## 2013-12-12 DIAGNOSIS — M1710 Unilateral primary osteoarthritis, unspecified knee: Secondary | ICD-10-CM | POA: Insufficient documentation

## 2013-12-12 HISTORY — PX: BIV ICD GENERTAOR CHANGE OUT: SHX5745

## 2013-12-12 LAB — CBC
HCT: 31.3 % — ABNORMAL LOW (ref 36.0–46.0)
Hemoglobin: 10.6 g/dL — ABNORMAL LOW (ref 12.0–15.0)
MCH: 29 pg (ref 26.0–34.0)
MCHC: 33.9 g/dL (ref 30.0–36.0)
MCV: 85.8 fL (ref 78.0–100.0)
PLATELETS: 211 10*3/uL (ref 150–400)
RBC: 3.65 MIL/uL — ABNORMAL LOW (ref 3.87–5.11)
RDW: 14.5 % (ref 11.5–15.5)
WBC: 5.3 10*3/uL (ref 4.0–10.5)

## 2013-12-12 LAB — PROTIME-INR
INR: 2.28 — ABNORMAL HIGH (ref 0.00–1.49)
PROTHROMBIN TIME: 24.4 s — AB (ref 11.6–15.2)

## 2013-12-12 LAB — BASIC METABOLIC PANEL
BUN: 39 mg/dL — ABNORMAL HIGH (ref 6–23)
CALCIUM: 9 mg/dL (ref 8.4–10.5)
CO2: 22 mEq/L (ref 19–32)
Chloride: 104 mEq/L (ref 96–112)
Creatinine, Ser: 1.96 mg/dL — ABNORMAL HIGH (ref 0.50–1.10)
GFR calc Af Amer: 27 mL/min — ABNORMAL LOW (ref >90)
GFR, EST NON AFRICAN AMERICAN: 23 mL/min — AB (ref >90)
Glucose, Bld: 118 mg/dL — ABNORMAL HIGH (ref 70–99)
Potassium: 3.5 mEq/L — ABNORMAL LOW (ref 3.7–5.3)
SODIUM: 144 meq/L (ref 137–147)

## 2013-12-12 LAB — SURGICAL PCR SCREEN
MRSA, PCR: NEGATIVE
STAPHYLOCOCCUS AUREUS: NEGATIVE

## 2013-12-12 SURGERY — BIV ICD GENERTAOR CHANGE OUT
Anesthesia: LOCAL

## 2013-12-12 MED ORDER — SODIUM CHLORIDE 0.9 % IV SOLN
INTRAVENOUS | Status: DC
  Administered 2013-12-12: 09:00:00 via INTRAVENOUS

## 2013-12-12 MED ORDER — MIDAZOLAM HCL 5 MG/5ML IJ SOLN
INTRAMUSCULAR | Status: AC
  Filled 2013-12-12: qty 5

## 2013-12-12 MED ORDER — CHLORHEXIDINE GLUCONATE 4 % EX LIQD
60.0000 mL | Freq: Once | CUTANEOUS | Status: DC
  Filled 2013-12-12: qty 60

## 2013-12-12 MED ORDER — FENTANYL CITRATE 0.05 MG/ML IJ SOLN
INTRAMUSCULAR | Status: AC
  Filled 2013-12-12: qty 2

## 2013-12-12 MED ORDER — ONDANSETRON HCL 4 MG/2ML IJ SOLN: 4.0000 mg | Freq: Four times a day (QID) | INTRAMUSCULAR | Status: DC | PRN

## 2013-12-12 MED ORDER — MUPIROCIN 2 % EX OINT
TOPICAL_OINTMENT | CUTANEOUS | Status: AC
  Administered 2013-12-12: 1 via NASAL
  Filled 2013-12-12: qty 22

## 2013-12-12 MED ORDER — ACETAMINOPHEN 325 MG PO TABS: 325.0000 mg | ORAL_TABLET | ORAL | Status: DC | PRN

## 2013-12-12 MED ORDER — LIDOCAINE HCL (PF) 1 % IJ SOLN
INTRAMUSCULAR | Status: AC
  Filled 2013-12-12: qty 60

## 2013-12-12 MED ORDER — MUPIROCIN 2 % EX OINT
TOPICAL_OINTMENT | Freq: Two times a day (BID) | CUTANEOUS | Status: DC
  Administered 2013-12-12: 1 via NASAL

## 2013-12-12 NOTE — Discharge Instructions (Signed)
Pacemaker Battery Change, Care After  °Refer to this sheet in the next few weeks. These instructions provide you with information on caring for yourself after your procedure. Your health care provider may also give you more specific instructions. Your treatment has been planned according to current medical practices, but problems sometimes occur. Call your health care provider if you have any problems or questions after your procedure. °WHAT TO EXPECT AFTER THE PROCEDURE °After your procedure, it is typical to have the following sensations: °· Soreness at the pacemaker site. °HOME CARE INSTRUCTIONS  °· Keep the incision clean and dry. °· Unless advised otherwise, you may shower beginning 48 hours after your procedure. °· For the first week after the replacement, avoid stretching motions that pull at the incision site and avoid heavy exercise with the arm on the same side as the incision. °· Only take over-the-counter or prescription medicines for pain, discomfort, or fever as directed by your health care provider. °· Your health care provider will tell you when you will need to next test your pacemaker by telephone or when to return to the office for follow up for removal of stitches. °SEEK MEDICAL CARE IF:  °· You have pain at the incision site that is not relieved by over-the-counter or prescription medicine. °· There is drainage or pus from the incision site. °· There is swelling larger than a lime at the incision site. °· You develop red streaking that extends above or below the incision site. °· You feel brief, intermittent palpitations, lightheadedness, or any symptoms that you feel might be related to your heart. °SEEK IMMEDIATE MEDICAL CARE IF:  °· You experience chest pain that is different than the pain at the pacemaker site. °· Shortness of breath. °· Palpitations or irregular heart beat. °· Lightheadedness that does not go away quickly. °· Fainting. °· You have pain that gets worse and is not relieved by  medicine. °MAKE SURE YOU:  °· Understand these instructions. °· Will watch your condition. °· Will get help right away if you are not doing well or get worse. °Document Released: 06/08/2013 Document Reviewed: 03/02/2013 °[employer]® Patient Information ©2014 [employer]. ° °

## 2013-12-12 NOTE — H&P (View-Only) (Signed)
HPI [name] returns today for followup.she is a very pleasant 80 year old woman with a nonischemic cardiomyopathy, now persistent atrial fibrillation, chronic systolic heart failure, left bundle branch block, status post biventricular ICD implantation. The patient has developed increasingly persistent atrial fibrillation over the last year. Despite this, her ventricular rate has been fairly well-controlled and her heart failure symptoms are very mild. She remains active cooking, and cleaning inside her house. She denies chest pain, shortness of breath, peripheral edema, or ICD shock.  She is approaching ERI on her device.  Allergies  Allergen Reactions  . Tuberculin Tests Swelling     Current Outpatient Prescriptions  Medication Sig Dispense Refill  . atorvastatin (LIPITOR) 20 MG tablet Take 1 tablet (20 mg total) by mouth daily.  90 tablet  3  . colchicine 0.6 MG tablet Take 0.6 mg by mouth daily.      . digoxin (LANOXIN) 0.125 MG tablet Take 1 tablet (0.125 mg total) by mouth daily.  90 tablet  2  . furosemide (LASIX) 80 MG tablet TAKE 1 TABLET DAILY  90 tablet  2  . hydrALAZINE (APRESOLINE) 100 MG tablet TAKE 1 TABLET THREE TIMES A DAY  270 tablet  3  . HYDROcodone-acetaminophen (NORCO) 7.5-325 MG per tablet Take 1 tablet by mouth every 4 (four) hours as needed.      . isosorbide mononitrate (IMDUR) 60 MG 24 hr tablet TAKE 2 TABLETS (120 MG) DAILY  180 tablet  0  . Multiple Vitamin (MULTIVITAMIN) tablet Take 0.5 tablets by mouth daily.       . nitroGLYCERIN (NITROSTAT) 0.4 MG SL tablet Place 0.4 mg under the tongue every 5 (five) minutes as needed.      . potassium chloride SA (K-DUR,KLOR-CON) 20 MEQ tablet TAKE 2 TABLETS IN THE MORNING AND 1 TABLET IN THE EVENING  270 tablet  6  . warfarin (COUMADIN) 5 MG tablet Take 5 mg by mouth daily.       No current facility-administered medications for this visit.     Past Medical History  Diagnosis Date  . Nonischemic cardiomyopathy    Initial dx 1992; nl coronary angios  in 1/03; AICD/bivent. pacemaker - 4/09; EF 20% in '92, 15-20% in '02, 20-25% in '08, 40-45% in 6/10.; digoxin toxicity in 2009; ACE Inhibitor discontinued in 2010 during hospital admission  for low output syndrome  . Hypertension     04/2011-normal electrolytes  . Chronic kidney disease, stage II (mild)     creatinine of 1.4 - 3/08, 2.1 - 2/09, 1.55 - 3/10, 1.4 - 7/10; 1.63 in 04/2011  . CVA (cerebral vascular accident) 471992  . PSVT (paroxysmal supraventricular tachycardia)     s/p radiofrequency ablation  . Gout     uric acid of 10 6/11  . Diabetes mellitus     dietary management   . Anemia     hemoglobin of 11/33 in 11/09; 11/34.6 with MCV of 89 in 10/2010  . Thrombus     left upper extremity; following device insertion  . Fall     in bathroom; 12/10  . DJD (degenerative joint disease)     knees; right hip  . Chronic anticoagulation     Hemoccult-negative in 10/2010    ROS:   All systems reviewed and negative except as noted in the HPI.   Past Surgical History  Procedure Laterality Date  . Abdominal hysterectomy    . Pacemaker insertion  4/09    Medtronic: AICD/bi-V pacing     Family  History  Problem Relation Age of Onset  . Diabetes Brother   . Heart attack Sister 39    also has liver problems      History   Social History  . Marital Status: Married    Spouse Name: N/A    Number of Children: N/A  . Years of Education: N/A   Occupational History  . Not on file.   Social History Main Topics  . Smoking status: Never Smoker   . Smokeless tobacco: Never Used     Comment: tobacco use  - no  . Alcohol Use: No  . Drug Use: No  . Sexual Activity: Not on file   Other Topics Concern  . Not on file   Social History Narrative   Married, retired, does not get regular exercise, no caffeine.      BP 103/73  Pulse 102  Ht 5\' 8"  (1.727 m)  Wt 179 lb (81.194 kg)  BMI 27.22 kg/m2  Physical Exam:  Well appearing  80 year old woman,NAD HEENT: Unremarkable Neck:  6 cm JVD, no thyromegally Back:  No CVA tenderness Lungs:  Clear with no wheezes, rales, or rhonchi. HEART:  IRegular rate rhythm, no murmurs, no rubs, no clicks Abd:  soft, positive bowel sounds, no organomegally, no rebound, no guarding Ext:  2 plus pulses, no edema, no cyanosis, no clubbing Skin:  No rashes no nodules Neuro:  CN II through XII intact, motor grossly intact   DEVICE  Normal device function.  See PaceArt for details. BiV Pacing approx. 65%  Assess/Plan:

## 2013-12-12 NOTE — Interval H&P Note (Signed)
History and Physical Interval Note:  12/12/2013 10:34 AM  [name]  has presented today for surgery, with the diagnosis of ERI  The various methods of treatment have been discussed with the patient and family. After consideration of risks, benefits and other options for treatment, the patient has consented to  Procedure(s): BIV ICD GENERTAOR CHANGE OUT (N/A) as a surgical intervention .  The patient's history has been reviewed, patient examined, no change in status, stable for surgery.  I have reviewed the patient's chart and labs.  Questions were answered to the patient's satisfaction.     Marinus Maw

## 2013-12-12 NOTE — CV Procedure (Signed)
Electrophysiology procedure note  Procedure: Removal of a previous implanted biventricular ICD, and insertion of a new biventricular ICD with defibrillation threshold testing  Indication: Long-standing nonischemic cardiomyopathy, complete heart block, ejection fraction 20%, chronic systolic heart failure, previously class III, now class II with prior ICD at elective replacement  Description of procedure: After informed consent was obtained, the patient was taken to the diagnostic electrophysiology laboratory in the fasting state. After the usual preparation and draping, intravenous fentanyl and Versed were given for sedation. 30 cc of lidocaine was infiltrated into the left infraclavicular region. A 5 cm incision was carried out. Electrocautery was utilized to dissect down to the ICD pocket. The pocket was opened, and electrocautery was utilized to free up the dense fibrous adhesions. The ICD was removed with gentle traction. The pocket was irrigated with antibiotic irrigation. The old Medtronic biventricular ICD was removed, and the new Medtronic biventricular ICD, serial number [id number] was connected to the old ICD and pacing leads, and placed back in the subcutaneous pocket. The pocket was irrigated with additional antibiotic irrigation. Electrocautery was utilized to assure hemostasis. The incision was closed with 2 layers of Vicryl suture. The patient was more deeply sedated for defibrillation threshold testing.  At this point, I scrubbed out of the procedure.  Under my direct supervision, I sedated the patient more deeply with fentanyl and Versed. Ventricular fibrillation was induced with a T-wave shock. After appropriate sensing and charging, a 20 J shock was delivered, which terminated ventricular fibrillation, and restored sinus rhythm. The patient developed an atrial tachycardia. Her ventricular rate was well controlled secondary to underlying complete heart block. The shocking impedance was 35  ohms. The total duration was 10 seconds. The patient was allowed to awaken, and returned to the recovery area in satisfactory condition.  Complications: There were no major procedure complications  Conclusion: Successful removal and insertion of a new biventricular ICD in a patient with long-standing nonischemic cardiomyopathy, chronic systolic heart failure, complete heart block, status post ICD implantation with her old device at elective replacement. Satisfactory defibrillation threshold.  Lewayne Bunting, M.D.

## 2013-12-12 NOTE — H&P (Signed)
  ICD Criteria  Current LVEF:20% ;Obtained > 6 months ago.   NYHA Functional Classification: Class II  Heart Failure History:  Yes, Duration of heart failure since onset is > 9 months  Non-Ischemic Dilated Cardiomyopathy History:  Yes, timeframe is > 9 months  Atrial Fibrillation/Atrial Flutter:  Yes, A-Fib/A-Flutter type: Permanent (>1 year).  Ventricular Tachycardia History:  No.  Cardiac Arrest History:  No  History of Syndromes with Risk of Sudden Death:  No.  Previous ICD:  Yes, ICD Type:  CRT-D, Reason for ICD:  Primary prevention.  20%  Electrophysiology Study: No.  Prior MI: No.  PPM: No.  OSA:  No  Patient Life Expectancy of >=1 year: Yes.  Anticoagulation Therapy:  Patient is on anticoagulation therapy, anticoagulation was NOT held prior to procedure.   Beta Blocker Therapy:  Yes.   Ace Inhibitor/ARB Therapy:  No, Reason not on Ace Inhibitor/ARB therapy:  hypotension

## 2013-12-13 ENCOUNTER — Encounter (HOSPITAL_COMMUNITY): Payer: Self-pay | Admitting: *Deleted

## 2013-12-22 ENCOUNTER — Ambulatory Visit (INDEPENDENT_AMBULATORY_CARE_PROVIDER_SITE_OTHER): Payer: Medicare HMO | Admitting: *Deleted

## 2013-12-22 ENCOUNTER — Ambulatory Visit: Payer: Medicare HMO

## 2013-12-22 ENCOUNTER — Encounter: Payer: Self-pay | Admitting: Internal Medicine

## 2013-12-22 DIAGNOSIS — I482 Chronic atrial fibrillation, unspecified: Secondary | ICD-10-CM

## 2013-12-22 DIAGNOSIS — I4891 Unspecified atrial fibrillation: Secondary | ICD-10-CM

## 2013-12-22 DIAGNOSIS — I428 Other cardiomyopathies: Secondary | ICD-10-CM

## 2013-12-22 LAB — MDC_IDC_ENUM_SESS_TYPE_INCLINIC
Battery Voltage: 3.11 V
Brady Statistic AP VS Percent: 0.13 %
Brady Statistic AS VP Percent: 62.49 %
Brady Statistic AS VS Percent: 35.88 %
Brady Statistic RV Percent Paced: 66.93 %
Date Time Interrogation Session: 20150423092156
HIGH POWER IMPEDANCE MEASURED VALUE: 171 Ohm
HIGH POWER IMPEDANCE MEASURED VALUE: 35 Ohm
HIGH POWER IMPEDANCE MEASURED VALUE: 46 Ohm
Lead Channel Impedance Value: 247 Ohm
Lead Channel Impedance Value: 399 Ohm
Lead Channel Impedance Value: 399 Ohm
Lead Channel Impedance Value: 494 Ohm
Lead Channel Pacing Threshold Amplitude: 0.625 V
Lead Channel Pacing Threshold Pulse Width: 0.8 ms
Lead Channel Sensing Intrinsic Amplitude: 14 mV
Lead Channel Setting Pacing Amplitude: 2 V
Lead Channel Setting Pacing Amplitude: 2.5 V
Lead Channel Setting Sensing Sensitivity: 0.3 mV
MDC IDC MSMT BATTERY REMAINING LONGEVITY: 90 mo
MDC IDC MSMT LEADCHNL LV IMPEDANCE VALUE: 532 Ohm
MDC IDC MSMT LEADCHNL LV PACING THRESHOLD AMPLITUDE: 1.5 V
MDC IDC MSMT LEADCHNL RA SENSING INTR AMPL: 1.125 mV
MDC IDC MSMT LEADCHNL RV PACING THRESHOLD PULSEWIDTH: 0.4 ms
MDC IDC SET LEADCHNL LV PACING AMPLITUDE: 2.5 V
MDC IDC SET LEADCHNL LV PACING PULSEWIDTH: 0.8 ms
MDC IDC SET LEADCHNL RV PACING PULSEWIDTH: 0.4 ms
MDC IDC SET ZONE DETECTION INTERVAL: 330 ms
MDC IDC SET ZONE DETECTION INTERVAL: 400 ms
MDC IDC STAT BRADY AP VP PERCENT: 1.5 %
MDC IDC STAT BRADY RA PERCENT PACED: 1.63 %
Zone Setting Detection Interval: 280 ms
Zone Setting Detection Interval: 450 ms

## 2013-12-22 NOTE — Progress Notes (Signed)
Wound check ICD in clinic. Normal device function. Pt in AF 99.9 of time. + Warfarin. Site well healed with no swelling or redness. Changed VF NID from 18/24 to 30/40 and VT NID to 32. Changed RV output from 2.00 to 2.50 V. ROV in 3 mths w/GT in RDS.

## 2013-12-26 ENCOUNTER — Other Ambulatory Visit: Payer: Self-pay | Admitting: *Deleted

## 2013-12-26 MED ORDER — METOPROLOL TARTRATE 25 MG PO TABS
25.0000 mg | ORAL_TABLET | Freq: Two times a day (BID) | ORAL | Status: DC
Start: 2013-12-26 — End: 2014-04-21

## 2014-01-08 ENCOUNTER — Other Ambulatory Visit: Payer: Self-pay | Admitting: Adult Health

## 2014-01-10 ENCOUNTER — Telehealth: Payer: Self-pay | Admitting: Adult Health

## 2014-01-10 NOTE — Telephone Encounter (Signed)
Please see papers in refill bin / tgs  °

## 2014-01-11 ENCOUNTER — Telehealth: Payer: Self-pay | Admitting: Adult Health

## 2014-01-11 NOTE — Telephone Encounter (Signed)
Form from [facility] put in [facility], NP folder

## 2014-01-11 NOTE — Telephone Encounter (Signed)
Please see paper in refill bin / tgs  °

## 2014-01-13 ENCOUNTER — Other Ambulatory Visit: Payer: Self-pay | Admitting: Adult Health

## 2014-04-03 ENCOUNTER — Emergency Department (HOSPITAL_COMMUNITY): Payer: Medicare HMO

## 2014-04-03 ENCOUNTER — Telehealth: Payer: Self-pay

## 2014-04-03 ENCOUNTER — Inpatient Hospital Stay (HOSPITAL_COMMUNITY)
Admission: EM | Admit: 2014-04-03 | Discharge: 2014-04-21 | DRG: 250 | Disposition: A | Discharge status: Home Health | Payer: Medicare HMO | Attending: Internal Medicine | Admitting: Internal Medicine

## 2014-04-03 ENCOUNTER — Encounter (HOSPITAL_COMMUNITY): Payer: Self-pay | Admitting: Emergency Medicine

## 2014-04-03 DIAGNOSIS — I5043 Acute on chronic combined systolic (congestive) and diastolic (congestive) heart failure: Principal | ICD-10-CM | POA: Diagnosis present

## 2014-04-03 DIAGNOSIS — R5381 Other malaise: Secondary | ICD-10-CM | POA: Diagnosis present

## 2014-04-03 DIAGNOSIS — I129 Hypertensive chronic kidney disease with stage 1 through stage 4 chronic kidney disease, or unspecified chronic kidney disease: Secondary | ICD-10-CM | POA: Diagnosis present

## 2014-04-03 DIAGNOSIS — N179 Acute kidney failure, unspecified: Secondary | ICD-10-CM

## 2014-04-03 DIAGNOSIS — N039 Chronic nephritic syndrome with unspecified morphologic changes: Secondary | ICD-10-CM

## 2014-04-03 DIAGNOSIS — D631 Anemia in chronic kidney disease: Secondary | ICD-10-CM | POA: Diagnosis present

## 2014-04-03 DIAGNOSIS — M549 Dorsalgia, unspecified: Secondary | ICD-10-CM | POA: Diagnosis present

## 2014-04-03 DIAGNOSIS — M109 Gout, unspecified: Secondary | ICD-10-CM | POA: Diagnosis present

## 2014-04-03 DIAGNOSIS — R17 Unspecified jaundice: Secondary | ICD-10-CM | POA: Diagnosis present

## 2014-04-03 DIAGNOSIS — I5023 Acute on chronic systolic (congestive) heart failure: Secondary | ICD-10-CM | POA: Diagnosis present

## 2014-04-03 DIAGNOSIS — G8929 Other chronic pain: Secondary | ICD-10-CM | POA: Diagnosis present

## 2014-04-03 DIAGNOSIS — Z7901 Long term (current) use of anticoagulants: Secondary | ICD-10-CM | POA: Diagnosis not present

## 2014-04-03 DIAGNOSIS — R570 Cardiogenic shock: Secondary | ICD-10-CM | POA: Diagnosis not present

## 2014-04-03 DIAGNOSIS — Y849 Medical procedure, unspecified as the cause of abnormal reaction of the patient, or of later complication, without mention of misadventure at the time of the procedure: Secondary | ICD-10-CM | POA: Diagnosis not present

## 2014-04-03 DIAGNOSIS — I428 Other cardiomyopathies: Secondary | ICD-10-CM | POA: Diagnosis present

## 2014-04-03 DIAGNOSIS — I4891 Unspecified atrial fibrillation: Secondary | ICD-10-CM | POA: Diagnosis present

## 2014-04-03 DIAGNOSIS — D649 Anemia, unspecified: Secondary | ICD-10-CM | POA: Diagnosis present

## 2014-04-03 DIAGNOSIS — R112 Nausea with vomiting, unspecified: Secondary | ICD-10-CM

## 2014-04-03 DIAGNOSIS — N184 Chronic kidney disease, stage 4 (severe): Secondary | ICD-10-CM | POA: Diagnosis present

## 2014-04-03 DIAGNOSIS — Y9210 Unspecified residential institution as the place of occurrence of the external cause: Secondary | ICD-10-CM | POA: Diagnosis not present

## 2014-04-03 DIAGNOSIS — N183 Chronic kidney disease, stage 3 unspecified: Secondary | ICD-10-CM

## 2014-04-03 DIAGNOSIS — I4729 Other ventricular tachycardia: Secondary | ICD-10-CM | POA: Diagnosis not present

## 2014-04-03 DIAGNOSIS — R740 Nonspecific elevation of levels of transaminase and lactic acid dehydrogenase [LDH]: Secondary | ICD-10-CM

## 2014-04-03 DIAGNOSIS — E876 Hypokalemia: Secondary | ICD-10-CM | POA: Diagnosis not present

## 2014-04-03 DIAGNOSIS — I509 Heart failure, unspecified: Secondary | ICD-10-CM | POA: Diagnosis present

## 2014-04-03 DIAGNOSIS — E119 Type 2 diabetes mellitus without complications: Secondary | ICD-10-CM | POA: Diagnosis present

## 2014-04-03 DIAGNOSIS — Z79899 Other long term (current) drug therapy: Secondary | ICD-10-CM

## 2014-04-03 DIAGNOSIS — I472 Ventricular tachycardia, unspecified: Secondary | ICD-10-CM | POA: Diagnosis not present

## 2014-04-03 DIAGNOSIS — I749 Embolism and thrombosis of unspecified artery: Secondary | ICD-10-CM | POA: Diagnosis not present

## 2014-04-03 DIAGNOSIS — Z887 Allergy status to serum and vaccine status: Secondary | ICD-10-CM | POA: Diagnosis not present

## 2014-04-03 DIAGNOSIS — T859XXA Unspecified complication of internal prosthetic device, implant and graft, initial encounter: Secondary | ICD-10-CM | POA: Diagnosis not present

## 2014-04-03 DIAGNOSIS — Z8249 Family history of ischemic heart disease and other diseases of the circulatory system: Secondary | ICD-10-CM

## 2014-04-03 DIAGNOSIS — K761 Chronic passive congestion of liver: Secondary | ICD-10-CM | POA: Diagnosis present

## 2014-04-03 DIAGNOSIS — I498 Other specified cardiac arrhythmias: Secondary | ICD-10-CM | POA: Diagnosis not present

## 2014-04-03 DIAGNOSIS — Z8673 Personal history of transient ischemic attack (TIA), and cerebral infarction without residual deficits: Secondary | ICD-10-CM

## 2014-04-03 DIAGNOSIS — I2510 Atherosclerotic heart disease of native coronary artery without angina pectoris: Secondary | ICD-10-CM | POA: Diagnosis present

## 2014-04-03 DIAGNOSIS — K810 Acute cholecystitis: Secondary | ICD-10-CM | POA: Diagnosis present

## 2014-04-03 DIAGNOSIS — R0600 Dyspnea, unspecified: Secondary | ICD-10-CM | POA: Diagnosis present

## 2014-04-03 DIAGNOSIS — Z833 Family history of diabetes mellitus: Secondary | ICD-10-CM | POA: Diagnosis not present

## 2014-04-03 DIAGNOSIS — R1011 Right upper quadrant pain: Secondary | ICD-10-CM

## 2014-04-03 DIAGNOSIS — I482 Chronic atrial fibrillation, unspecified: Secondary | ICD-10-CM | POA: Diagnosis present

## 2014-04-03 DIAGNOSIS — R7401 Elevation of levels of liver transaminase levels: Secondary | ICD-10-CM

## 2014-04-03 DIAGNOSIS — Z95810 Presence of automatic (implantable) cardiac defibrillator: Secondary | ICD-10-CM

## 2014-04-03 DIAGNOSIS — I10 Essential (primary) hypertension: Secondary | ICD-10-CM | POA: Diagnosis present

## 2014-04-03 HISTORY — DX: Unspecified osteoarthritis, unspecified site: M19.90

## 2014-04-03 HISTORY — DX: Presence of automatic (implantable) cardiac defibrillator: Z95.810

## 2014-04-03 HISTORY — DX: Dorsalgia, unspecified: M54.9

## 2014-04-03 HISTORY — DX: Inflammatory liver disease, unspecified: K75.9

## 2014-04-03 HISTORY — DX: Personal history of other medical treatment: Z92.89

## 2014-04-03 HISTORY — DX: Atherosclerotic heart disease of native coronary artery without angina pectoris: I25.10

## 2014-04-03 HISTORY — DX: Pain in thoracic spine: M54.6

## 2014-04-03 HISTORY — DX: Other chronic pain: G89.29

## 2014-04-03 HISTORY — DX: Heart failure, unspecified: I50.9

## 2014-04-03 LAB — PROTIME-INR
INR: 2.73 — AB (ref 0.00–1.49)
PROTHROMBIN TIME: 28.9 s — AB (ref 11.6–15.2)

## 2014-04-03 LAB — HEPATIC FUNCTION PANEL
ALT: 46 U/L — ABNORMAL HIGH (ref 0–35)
AST: 51 U/L — ABNORMAL HIGH (ref 0–37)
Albumin: 3.4 g/dL — ABNORMAL LOW (ref 3.5–5.2)
Alkaline Phosphatase: 122 U/L — ABNORMAL HIGH (ref 39–117)
BILIRUBIN DIRECT: 2.2 mg/dL — AB (ref 0.0–0.3)
BILIRUBIN INDIRECT: 1.6 mg/dL — AB (ref 0.3–0.9)
Total Bilirubin: 3.8 mg/dL — ABNORMAL HIGH (ref 0.3–1.2)
Total Protein: 7.2 g/dL (ref 6.0–8.3)

## 2014-04-03 LAB — BASIC METABOLIC PANEL
Anion gap: 17 — ABNORMAL HIGH (ref 5–15)
BUN: 38 mg/dL — AB (ref 6–23)
CHLORIDE: 107 meq/L (ref 96–112)
CO2: 19 meq/L (ref 19–32)
CREATININE: 2.07 mg/dL — AB (ref 0.50–1.10)
Calcium: 9.2 mg/dL (ref 8.4–10.5)
GFR calc Af Amer: 25 mL/min — ABNORMAL LOW (ref >90)
GFR calc non Af Amer: 22 mL/min — ABNORMAL LOW (ref >90)
Glucose, Bld: 164 mg/dL — ABNORMAL HIGH (ref 70–99)
Potassium: 4 mEq/L (ref 3.7–5.3)
Sodium: 143 mEq/L (ref 137–147)

## 2014-04-03 LAB — HEMOGLOBIN A1C
HEMOGLOBIN A1C: 5.7 % — AB (ref <5.7)
Mean Plasma Glucose: 117 mg/dL — ABNORMAL HIGH (ref <117)

## 2014-04-03 LAB — CBC WITH DIFFERENTIAL/PLATELET
Basophils Absolute: 0 10*3/uL (ref 0.0–0.1)
Basophils Relative: 0 % (ref 0–1)
EOS PCT: 0 % (ref 0–5)
Eosinophils Absolute: 0 10*3/uL (ref 0.0–0.7)
HCT: 34.9 % — ABNORMAL LOW (ref 36.0–46.0)
HEMOGLOBIN: 11.7 g/dL — AB (ref 12.0–15.0)
LYMPHS PCT: 20 % (ref 12–46)
Lymphs Abs: 1.7 10*3/uL (ref 0.7–4.0)
MCH: 29 pg (ref 26.0–34.0)
MCHC: 33.5 g/dL (ref 30.0–36.0)
MCV: 86.6 fL (ref 78.0–100.0)
MONO ABS: 0.8 10*3/uL (ref 0.1–1.0)
Monocytes Relative: 9 % (ref 3–12)
NEUTROS ABS: 6 10*3/uL (ref 1.7–7.7)
Neutrophils Relative %: 71 % (ref 43–77)
Platelets: 192 10*3/uL (ref 150–400)
RBC: 4.03 MIL/uL (ref 3.87–5.11)
RDW: 14.7 % (ref 11.5–15.5)
WBC: 8.5 10*3/uL (ref 4.0–10.5)

## 2014-04-03 LAB — URINALYSIS, ROUTINE W REFLEX MICROSCOPIC
Bilirubin Urine: NEGATIVE
GLUCOSE, UA: NEGATIVE mg/dL
Ketones, ur: NEGATIVE mg/dL
LEUKOCYTES UA: NEGATIVE
Nitrite: NEGATIVE
PROTEIN: 30 mg/dL — AB
SPECIFIC GRAVITY, URINE: 1.012 (ref 1.005–1.030)
Urobilinogen, UA: 1 mg/dL (ref 0.0–1.0)
pH: 5 (ref 5.0–8.0)

## 2014-04-03 LAB — TROPONIN I: Troponin I: <0.3 ng/mL (ref <0.30)

## 2014-04-03 LAB — URINE MICROSCOPIC-ADD ON

## 2014-04-03 LAB — PRO B NATRIURETIC PEPTIDE: Pro B Natriuretic peptide (BNP): 19030 pg/mL — ABNORMAL HIGH (ref 0–450)

## 2014-04-03 LAB — LACTIC ACID, PLASMA: LACTIC ACID, VENOUS: 1.7 mmol/L (ref 0.5–2.2)

## 2014-04-03 MED ORDER — HYDRALAZINE HCL 50 MG PO TABS
100.0000 mg | ORAL_TABLET | Freq: Three times a day (TID) | ORAL | Status: DC
  Administered 2014-04-03 – 2014-04-05 (×6): 100 mg via ORAL
  Filled 2014-04-03 (×8): qty 2

## 2014-04-03 MED ORDER — BISACODYL 10 MG RE SUPP: 10.0000 mg | Freq: Every day | RECTAL | Status: DC | PRN

## 2014-04-03 MED ORDER — SODIUM CHLORIDE 0.9 % IJ SOLN
3.0000 mL | INTRAMUSCULAR | Status: DC | PRN
Start: 2014-04-03 — End: 2014-04-07

## 2014-04-03 MED ORDER — SODIUM CHLORIDE 0.9 % IJ SOLN
3.0000 mL | Freq: Two times a day (BID) | INTRAMUSCULAR | Status: DC
  Administered 2014-04-04 – 2014-04-06 (×5): 3 mL via INTRAVENOUS

## 2014-04-03 MED ORDER — POTASSIUM CHLORIDE CRYS ER 20 MEQ PO TBCR
20.0000 meq | EXTENDED_RELEASE_TABLET | Freq: Two times a day (BID) | ORAL | Status: DC
  Filled 2014-04-03: qty 2

## 2014-04-03 MED ORDER — DIGOXIN 125 MCG PO TABS
0.1250 mg | ORAL_TABLET | Freq: Every day | ORAL | Status: DC
  Administered 2014-04-03 – 2014-04-05 (×3): 0.125 mg via ORAL
  Filled 2014-04-03 (×3): qty 1

## 2014-04-03 MED ORDER — INSULIN ASPART 100 UNIT/ML ~~LOC~~ SOLN: 0.0000 [IU] | Freq: Three times a day (TID) | SUBCUTANEOUS | Status: DC

## 2014-04-03 MED ORDER — ISOSORBIDE MONONITRATE ER 60 MG PO TB24
60.0000 mg | ORAL_TABLET | Freq: Every day | ORAL | Status: DC
  Administered 2014-04-03 – 2014-04-21 (×19): 60 mg via ORAL
  Filled 2014-04-03 (×19): qty 1

## 2014-04-03 MED ORDER — ACETAMINOPHEN 325 MG PO TABS: 650.0000 mg | ORAL_TABLET | Freq: Four times a day (QID) | ORAL | Status: DC | PRN

## 2014-04-03 MED ORDER — CARVEDILOL 25 MG PO TABS
25.0000 mg | ORAL_TABLET | Freq: Two times a day (BID) | ORAL | Status: DC
  Administered 2014-04-03 – 2014-04-04 (×3): 25 mg via ORAL
  Filled 2014-04-03 (×6): qty 1

## 2014-04-03 MED ORDER — NITROGLYCERIN 2 % TD OINT
1.0000 [in_us] | TOPICAL_OINTMENT | Freq: Once | TRANSDERMAL | Status: AC
  Administered 2014-04-03: 1 [in_us] via TOPICAL
  Filled 2014-04-03: qty 1

## 2014-04-03 MED ORDER — COLCHICINE 0.6 MG PO TABS
0.6000 mg | ORAL_TABLET | Freq: Every day | ORAL | Status: DC
  Administered 2014-04-03: 0.6 mg via ORAL
  Filled 2014-04-03 (×2): qty 1

## 2014-04-03 MED ORDER — ATORVASTATIN CALCIUM 20 MG PO TABS
20.0000 mg | ORAL_TABLET | Freq: Every day | ORAL | Status: DC
  Administered 2014-04-03 – 2014-04-21 (×19): 20 mg via ORAL
  Filled 2014-04-03 (×19): qty 1

## 2014-04-03 MED ORDER — METOPROLOL TARTRATE 1 MG/ML IV SOLN
2.5000 mg | Freq: Once | INTRAVENOUS | Status: DC
  Filled 2014-04-03 (×2): qty 5

## 2014-04-03 MED ORDER — SODIUM CHLORIDE 0.9 % IJ SOLN
3.0000 mL | Freq: Two times a day (BID) | INTRAMUSCULAR | Status: DC
Start: 2014-04-03 — End: 2014-04-07
  Administered 2014-04-04 – 2014-04-06 (×4): 3 mL via INTRAVENOUS

## 2014-04-03 MED ORDER — HYDROCODONE-ACETAMINOPHEN 7.5-325 MG PO TABS
1.0000 | ORAL_TABLET | ORAL | Status: DC | PRN
  Administered 2014-04-05 – 2014-04-07 (×2): 1 via ORAL
  Filled 2014-04-03 (×2): qty 1

## 2014-04-03 MED ORDER — NITROGLYCERIN 0.4 MG SL SUBL: 0.4000 mg | SUBLINGUAL_TABLET | SUBLINGUAL | Status: DC | PRN

## 2014-04-03 MED ORDER — SODIUM CHLORIDE 0.9 % IV SOLN: 250.0000 mL | INTRAVENOUS | Status: DC | PRN

## 2014-04-03 MED ORDER — ONDANSETRON HCL 4 MG PO TABS: 4.0000 mg | ORAL_TABLET | Freq: Four times a day (QID) | ORAL | Status: DC | PRN

## 2014-04-03 MED ORDER — INSULIN ASPART 100 UNIT/ML ~~LOC~~ SOLN: 0.0000 [IU] | Freq: Every day | SUBCUTANEOUS | Status: DC

## 2014-04-03 MED ORDER — FEBUXOSTAT 80 MG PO TABS
1.0000 | ORAL_TABLET | Freq: Every day | ORAL | Status: DC
  Administered 2014-04-03 – 2014-04-21 (×19): 80 mg via ORAL
  Filled 2014-04-03 (×20): qty 1

## 2014-04-03 MED ORDER — FUROSEMIDE 10 MG/ML IJ SOLN
80.0000 mg | Freq: Once | INTRAMUSCULAR | Status: AC
  Administered 2014-04-03: 80 mg via INTRAVENOUS
  Filled 2014-04-03: qty 8

## 2014-04-03 MED ORDER — ONDANSETRON HCL 4 MG/2ML IJ SOLN: 4.0000 mg | Freq: Four times a day (QID) | INTRAMUSCULAR | Status: DC | PRN

## 2014-04-03 MED ORDER — METOPROLOL TARTRATE 25 MG PO TABS
25.0000 mg | ORAL_TABLET | Freq: Two times a day (BID) | ORAL | Status: DC
  Administered 2014-04-04 – 2014-04-05 (×4): 25 mg via ORAL
  Filled 2014-04-03 (×5): qty 1

## 2014-04-03 MED ORDER — FUROSEMIDE 10 MG/ML IJ SOLN
80.0000 mg | Freq: Two times a day (BID) | INTRAMUSCULAR | Status: DC
  Administered 2014-04-04 (×2): 80 mg via INTRAVENOUS
  Filled 2014-04-03 (×3): qty 8

## 2014-04-03 MED ORDER — ACETAMINOPHEN 650 MG RE SUPP: 650.0000 mg | Freq: Four times a day (QID) | RECTAL | Status: DC | PRN

## 2014-04-03 NOTE — Telephone Encounter (Signed)
Spouse called to states they had travelled 6 hrs to [place] over the weekend and noted when they got there patient had become SOB with CP.She also vomited once.They returned home last night and patient is still SOB.I advised them to go to the ED for immediate evaluation.

## 2014-04-03 NOTE — ED Provider Notes (Signed)
CSN: [id number]     Arrival date & time 04/03/14  0957 History   First MD Initiated Contact with Patient 04/03/14 1012    This chart was scribed for Jeralyn Bennett, MD by Marica Otter, ED Scribe. This patient was seen in room APA18/AP[facility]18 and the patient's care was started at 10:42 AM.  Chief Complaint  Patient presents with  . Shortness of Breath   PCP: Alice Reichert, MD  The history is provided by the patient. No language interpreter was used.   HPI Comments: [name] is a 80 y.o. female, with an extensive medical Hx noted below significant for stroke, DM, CHF, HTN, and CAD, who presents to the Emergency Department complaining of intermittent SOB with associated nausea onset yesterday. Pt also complains of associated v/d (2 episodes) and mild abd pain onset yesterday. Pt denies chest pain. Pt states she did quite a bit of traveling this past weekend returning yesterday.   Denies any diarrhea or constipation. Rates abdominal pain at 2/10. It is mostly epigastric and right upper quadrant.  Past Medical History  Diagnosis Date  . Nonischemic cardiomyopathy     Initial dx 1992; nl coronary angios  in 1/03; AICD/bivent. pacemaker - 4/09; EF 20% in '92, 15-20% in '02, 20-25% in '08, 40-45% in 6/10.; digoxin toxicity in 2009; ACE Inhibitor discontinued in 2010 during hospital admission  for low output syndrome  . Hypertension     04/2011-normal electrolytes  . Chronic kidney disease, stage II (mild)     creatinine of 1.4 - 3/08, 2.1 - 2/09, 1.55 - 3/10, 1.4 - 7/10; 1.63 in 04/2011  . CVA (cerebral vascular accident) 47  . PSVT (paroxysmal supraventricular tachycardia)     s/p radiofrequency ablation  . Gout     uric acid of 10 6/11  . Diabetes mellitus     dietary management   . Anemia     hemoglobin of 11/33 in 11/09; 11/34.6 with MCV of 89 in 10/2010  . Thrombus     left upper extremity; following device insertion  . DJD (degenerative joint disease)     knees; right hip  .  Chronic anticoagulation     Hemoccult-negative in 10/2010  . Coronary artery disease   . CHF (congestive heart failure)    Past Surgical History  Procedure Laterality Date  . Abdominal hysterectomy    . Bi-ventricular implantable cardioverter defibrillator  (crt-d)  4/09; 12/13/2013    MDT CRTD implanted by Dr Ladona Ridgel; gen change to MDT CRTD 4-15 by Dr Ladona Ridgel   Family History  Problem Relation Age of Onset  . Diabetes Brother   . Heart attack Sister 57    also has liver problems    History  Substance Use Topics  . Smoking status: Never Smoker   . Smokeless tobacco: Never Used     Comment: tobacco use  - no  . Alcohol Use: No   OB History   Grav Para Term Preterm Abortions TAB SAB Ect Mult Living                 Review of Systems  Constitutional: Negative for fever.  Respiratory: Positive for shortness of breath. Negative for cough and chest tightness.   Cardiovascular: Positive for leg swelling. Negative for chest pain.  Gastrointestinal: Positive for nausea and abdominal pain. Negative for vomiting, diarrhea and blood in stool.  Genitourinary: Negative for dysuria.  Musculoskeletal: Negative for back pain.  Skin: Negative for wound.  Psychiatric/Behavioral: Negative for confusion.  All other systems reviewed and are negative.     Allergies  Tuberculin tests  Home Medications   Prior to Admission medications   Medication Sig Start Date End Date Taking? Authorizing Provider  atorvastatin (LIPITOR) 20 MG tablet Take 20 mg by mouth daily. 11/28/13  Yes Marinus MawGregg W Taylor, MD  carvedilol (COREG) 25 MG tablet Take 25 mg by mouth 2 (two) times daily with a meal.   Yes Historical Provider, MD  colchicine 0.6 MG tablet Take 0.6 mg by mouth daily.   Yes Historical Provider, MD  Febuxostat (ULORIC) 80 MG TABS Take 1 tablet by mouth daily.   Yes Historical Provider, MD  furosemide (LASIX) 80 MG tablet Take 80 mg by mouth daily.   Yes Historical Provider, MD  hydrALAZINE  (APRESOLINE) 100 MG tablet Take 100 mg by mouth 3 (three) times daily.    Yes Historical Provider, MD  HYDROcodone-acetaminophen (NORCO) 7.5-325 MG per tablet Take 1 tablet by mouth every 4 (four) hours as needed for moderate pain.    Yes Historical Provider, MD  isosorbide mononitrate (IMDUR) 60 MG 24 hr tablet TAKE 2 TABLETS DAILY 01/13/14  Yes Jodelle GrossKathryn M Lawrence, NP  LANOXIN 125 MCG tablet TAKE 1 TABLET DAILY   Yes Jodelle GrossKathryn M Lawrence, NP  metFORMIN (GLUCOPHAGE) 500 MG tablet Take 500 mg by mouth 2 (two) times daily with a meal.   Yes Historical Provider, MD  metoprolol tartrate (LOPRESSOR) 25 MG tablet Take 1 tablet (25 mg total) by mouth 2 (two) times daily. 12/26/13  Yes Jodelle GrossKathryn M Lawrence, NP  Multiple Vitamin (MULTIVITAMIN) tablet Take 0.5 tablets by mouth daily.    Yes Historical Provider, MD  nitroGLYCERIN (NITROSTAT) 0.4 MG SL tablet Place 0.4 mg under the tongue every 5 (five) minutes as needed for chest pain.    Yes Historical Provider, MD  Omega-3 Fatty Acids (FISH OIL) 1000 MG CAPS Take 1 capsule by mouth daily.   Yes Historical Provider, MD  potassium chloride SA (K-DUR,KLOR-CON) 20 MEQ tablet Take 20-40 mEq by mouth 2 (two) times daily. Take 40 mg by mouth in the morning and 20 mg by mouth in the evening.   Yes Historical Provider, MD  warfarin (COUMADIN) 5 MG tablet Take 5-7.5 mg by mouth daily. Take 7.5 mg by mouth on Thursday.  Take 5 mg by mouth on all other days.   Yes Historical Provider, MD   Triage Vitals: BP 112/78  Pulse 91  Temp(Src) 99.4 F (37.4 C) (Oral)  Resp 20  Ht 5\' 9"  (1.753 m)  Wt 176 lb (79.833 kg)  BMI 25.98 kg/m2  SpO2 97% Physical Exam  Nursing note and vitals reviewed. Constitutional: She is oriented to person, place, and time. No distress.  HENT:  Head: Normocephalic and atraumatic.  Neck: JVD present.  Cardiovascular: Normal rate and normal heart sounds.   Irregular rhythm  Pulmonary/Chest: Effort normal and breath sounds normal. No respiratory  distress. She has no wheezes.  Defibrillator palpated in left upper chest, no acute distress, speaking in short sentences  Abdominal: Soft. Bowel sounds are normal. There is no tenderness. There is no rebound and no guarding.  Musculoskeletal: She exhibits edema.  1+ bilateral lower extremity edema  Neurological: She is alert and oriented to person, place, and time.  Skin: Skin is warm and dry.  Psychiatric: She has a normal mood and affect.    ED Course  Procedures (including critical care time) DIAGNOSTIC STUDIES: Oxygen Saturation is 97% on RA, nl by my interpretation.  COORDINATION OF CARE: 10:46 AM-Discussed treatment plan which includes meds, imaging, EKG and IV saline with pt at bedside and pt agreed to plan.   Labs Review Labs Reviewed  CBC WITH DIFFERENTIAL - Abnormal; Notable for the following:    Hemoglobin 11.7 (*)    HCT 34.9 (*)    All other components within normal limits  BASIC METABOLIC PANEL - Abnormal; Notable for the following:    Glucose, Bld 164 (*)    BUN 38 (*)    Creatinine, Ser 2.07 (*)    GFR calc non Af Amer 22 (*)    GFR calc Af Amer 25 (*)    Anion gap 17 (*)    All other components within normal limits  PROTIME-INR - Abnormal; Notable for the following:    Prothrombin Time 28.9 (*)    INR 2.73 (*)    All other components within normal limits  PRO B NATRIURETIC PEPTIDE - Abnormal; Notable for the following:    Pro B Natriuretic peptide (BNP) 19030.0 (*)    All other components within normal limits  HEPATIC FUNCTION PANEL - Abnormal; Notable for the following:    Albumin 3.4 (*)    AST 51 (*)    ALT 46 (*)    Alkaline Phosphatase 122 (*)    Total Bilirubin 3.8 (*)    Bilirubin, Direct 2.2 (*)    Indirect Bilirubin 1.6 (*)    All other components within normal limits  TROPONIN I  LACTIC ACID, PLASMA    Imaging Review Dg Chest 2 View  04/03/2014   CLINICAL DATA:  Shortness of breath, weakness.  EXAM: CHEST  2 VIEW  COMPARISON:   10/03/2011  FINDINGS: Left AICD remains in place, unchanged. Cardiomegaly. No overt edema. Small right pleural effusion. No confluent opacity. No acute bony abnormality.  IMPRESSION: Cardiomegaly.  Small right pleural effusion.  No overt failure.   Electronically Signed   By: Charlett Nose M.D.   On: 04/03/2014 10:51   US Abdomen Limited Ruq  04/03/2014   CLINICAL DATA:  Elevated LFTs, vomiting  EXAM: US ABDOMEN LIMITED - RIGHT UPPER QUADRANT  COMPARISON:  None.  FINDINGS: Gallbladder:  No gallstones are identified. Wall thickening is identified however with edema within the gallbladder wall. A negative sonographic Eulah Pont sign is noted.  Common bile duct:  Diameter: 3.4 mm.  Liver:  No focal lesion identified. Within normal limits in parenchymal echogenicity.  Small right-sided pleural effusion is seen.  IMPRESSION: Thickened edematous gallbladder wall without definitive gallstones.   Electronically Signed   By: Alcide Clever M.D.   On: 04/03/2014 12:13     EKG Interpretation   Date/Time:  Monday April 03 2014 10:14:58 EDT Ventricular Rate:  93 PR Interval:    QRS Duration: 148 QT Interval:  444 QTC Calculation: 552 R Axis:   114 Text Interpretation:  Atrial fibrillation V paced Nonspecific  intraventricular conduction delay Abnormal lateral Q waves Probable  anteroseptal infarct, recent Abnormal T, consider ischemia, lateral leads  Inferior flipped Ts Confirmed by Adriel Kessen  MD, Corabelle Spackman ([id number]) on 04/03/2014  10:20:03 AM      MDM   Final diagnoses:  Acute on chronic systolic congestive heart failure  Transaminitis  Hyperbilirubinemia    Patient presents with shortness of breath. Also reports nausea and abdominal pain. Nontoxic on exam. He is speaking in short sentences.  No oxygen requirement at this time. Does have bilateral lower extremity edema. Patient is on Coumadin given prior strokes. No history of  blood clots but has had recent travel. Edema is symmetric. Basic labwork was  obtained.  Lab work notable for a BNP of 19,000 and a chest x-ray with a pleural effusion. Suspect this is related to the patient's heart failure. Last EF 20-25 percent.  Also notable is mild transaminitis and a bilirubin of 3.8. Right upper quadrant ultrasound was obtained. Patient was given 80 mg of IV Lasix for presumed CHF exacerbation. Given therapeutic, would have a suspicion at this time for acute PE given other findings of CHF.  Right upper quadrant ultrasound with diffuse gallbladder wall thickening without gallstones. Discussed with hospitalist for admission. We'll also consult general surgery who would like to evaluate the patient at [facility].  I personally performed the services described in this documentation, which was scribed in my presence. The recorded information has been reviewed and is accurate.     Shon Baton, MD 04/03/14 [phone]

## 2014-04-03 NOTE — Progress Notes (Signed)
ANTICOAGULATION CONSULT NOTE - Initial Consult  Pharmacy Consult for coumadin Indication: atrial fibrillation  Allergies  Allergen Reactions  . Tuberculin Tests Swelling    Patient Measurements: Height: 5\' 9"  (175.3 cm) Weight: 176 lb (79.833 kg) IBW/kg (Calculated) : 66.2   Vital Signs: Temp: 97.8 F (36.6 C) (08/03 1743) Temp src: Oral (08/03 1743) BP: 138/109 mmHg (08/03 1743) Pulse Rate: 87 (08/03 1743)  Labs:  Recent Labs  04/03/14 1020  HGB 11.7*  HCT 34.9*  PLT 192  LABPROT 28.9*  INR 2.73*  CREATININE 2.07*  TROPONINI <0.30    Estimated Creatinine Clearance: 25.3 ml/min (by C-G formula based on Cr of 2.07).   Medical History: Past Medical History  Diagnosis Date  . Nonischemic cardiomyopathy     Initial dx 1992; nl coronary angios  in 1/03; AICD/bivent. pacemaker - 4/09; EF 20% in '92, 15-20% in '02, 20-25% in '08, 40-45% in 6/10.; digoxin toxicity in 2009; ACE Inhibitor discontinued in 2010 during hospital admission  for low output syndrome  . Hypertension     04/2011-normal electrolytes  . Chronic kidney disease, stage II (mild)     creatinine of 1.4 - 3/08, 2.1 - 2/09, 1.55 - 3/10, 1.4 - 7/10; 1.63 in 04/2011  . CVA (cerebral vascular accident) 74  . PSVT (paroxysmal supraventricular tachycardia)     s/p radiofrequency ablation  . Gout     uric acid of 10 6/11  . Diabetes mellitus     dietary management   . Anemia     hemoglobin of 11/33 in 11/09; 11/34.6 with MCV of 89 in 10/2010  . Thrombus     left upper extremity; following device insertion  . DJD (degenerative joint disease)     knees; right hip  . Chronic anticoagulation     Hemoccult-negative in 10/2010  . Coronary artery disease   . CHF (congestive heart failure)     Medications:  Prescriptions prior to admission  Medication Sig Dispense Refill  . atorvastatin (LIPITOR) 20 MG tablet Take 20 mg by mouth daily.      . carvedilol (COREG) 25 MG tablet Take 25 mg by mouth 2 (two)  times daily with a meal.      . colchicine 0.6 MG tablet Take 0.6 mg by mouth daily.      . Febuxostat (ULORIC) 80 MG TABS Take 1 tablet by mouth daily.      . furosemide (LASIX) 80 MG tablet Take 80 mg by mouth daily.      . hydrALAZINE (APRESOLINE) 100 MG tablet Take 100 mg by mouth 3 (three) times daily.       Marland Kitchen HYDROcodone-acetaminophen (NORCO) 7.5-325 MG per tablet Take 1 tablet by mouth every 4 (four) hours as needed for moderate pain.       . isosorbide mononitrate (IMDUR) 60 MG 24 hr tablet TAKE 2 TABLETS DAILY  180 tablet  2  . LANOXIN 125 MCG tablet TAKE 1 TABLET DAILY  90 tablet  1  . metFORMIN (GLUCOPHAGE) 500 MG tablet Take 500 mg by mouth 2 (two) times daily with a meal.      . metoprolol tartrate (LOPRESSOR) 25 MG tablet Take 1 tablet (25 mg total) by mouth 2 (two) times daily.  60 tablet  3  . Multiple Vitamin (MULTIVITAMIN) tablet Take 0.5 tablets by mouth daily.       . nitroGLYCERIN (NITROSTAT) 0.4 MG SL tablet Place 0.4 mg under the tongue every 5 (five) minutes as needed for chest pain.       Marland Kitchen  Omega-3 Fatty Acids (FISH OIL) 1000 MG CAPS Take 1 capsule by mouth daily.      . potassium chloride SA (K-DUR,KLOR-CON) 20 MEQ tablet Take 20-40 mEq by mouth 2 (two) times daily. Take 40 mg by mouth in the morning and 20 mg by mouth in the evening.      . warfarin (COUMADIN) 5 MG tablet Take 5-7.5 mg by mouth daily. Take 7.5 mg by mouth on Thursday.  Take 5 mg by mouth on all other days.        Assessment: 80 yo [facility].  Her admit INR is  2.73.  Coumadin will be on hold for possible surgical intervention. Goal of Therapy:  Monitor platelets by anticoagulation protocol: Yes   Plan:  Hold coumadin for now. F/u surgical plans  Talbert CageSeay, Jaheem Hedgepath Poteet 04/03/2014,7:41 PM

## 2014-04-03 NOTE — H&P (Signed)
Patient seen and examined.  Note reviewed.  This is a 80 year old female who presents to the emergency room with complaints of nausea, vomiting, abdominal pain. She has known systolic dysfunction with an ejection fraction of 20-25%, atrial fibrillation on anticoagulation and reports that she is mildly short of breath. She has had decreased by mouth intake for the last several days. She was evaluated in the emergency room where renal function appears to be near baseline. BNP is markedly elevated from prior level which was obtained several years ago. Chest x-ray does not show any significant interstitial edema, and she does not have any significant lower extremity edema. LFTs and bilirubin are elevated. Ultrasound of right upper quadrant indicates thickened gallbladder. Case was discussed by EDP with general surgery at [facility]. Recommendations were to transfer the patient to [facility] for surgical evaluation for cholecystitis. She'll be placed on gentle intravenous diuresis since she does have an element of volume overload, although this may not be too far from her baseline. She may require cardiology consultation, especially surgical intervention will be required. Case was discussed with Dr. Vanessa Barbara who has agreed to accept the patient in transfer.  Ebonie Westerlund

## 2014-04-03 NOTE — H&P (Signed)
[facility] History and Physical  Brandi HackMargaret L [name] NGE:[id number] DOB: 06/27/1933 DOA: 04/03/2014  Referring physician:  PCP: Alice ReichertMCINNIS,ANGUS G, MD   Chief Complaint: nausea/vomiting/sob  HPI: [name] is a very pleasant 80 y.o. female with a past medical history that includes nonischemic cardiomyopathy status post insertion of new biventricular ICD with defibrillation testing in April, hypertension, chronic systolic heart failure, A. Fib, chronic kidney disease presents to the emergency room with the chief complaint of 2 day history of persistent nausea vomiting associated with worsening shortness of breath. Initial evaluation in the emergency department reveals acute on chronic systolic heart failure and Thickened edematous gallbladder wall without definitive gallstones, total bilirubin of 3.8 proBNP of 19,003 and acute on chronic renal failure.  Patient reports having traveled to [place] over the past weekend. 2 days ago she developed sudden onset of nausea and decreased appetite. She states the nausea persisted and progressed to some vomiting. She has been unable to tolerate by mouth nourishment over the last day and a half. Yesterday she developed worsening shortness of breath and had 2 episodes of diarrhea. She denies chest pain palpitation. She denies abdominal pain. She denies lower extremity edema or orthopnea. She does not wear oxygen at home. She denies any fever chills or recent sick contacts. She denies dysuria hematuria frequency or urgency.  Workup in the emergency department significant for a creatinine of 2.7, alkaline phosphatase 122 AST 51 ALT 46 total bili 3.8. ProBNP 19,030, lactic acid 1.7. Chest x-ray Cardiomegaly. Small right pleural effusion. No overt failure. , ultrasound as above. EKG with afib. She received lasix 80mg  IV in ED.  The time my exam she is hemodynamically stable afebrile and nontoxic appearing. General surgery consulted at [place] and  recommended transfer.  Review of Systems:  10 point review of systems completed and all systems are negative except as indicated in the history of present illness Past Medical History  Diagnosis Date  . Nonischemic cardiomyopathy     Initial dx 1992; nl coronary angios  in 1/03; AICD/bivent. pacemaker - 4/09; EF 20% in '92, 15-20% in '02, 20-25% in '08, 40-45% in 6/10.; digoxin toxicity in 2009; ACE Inhibitor discontinued in 2010 during hospital admission  for low output syndrome  . Hypertension     04/2011-normal electrolytes  . Chronic kidney disease, stage II (mild)     creatinine of 1.4 - 3/08, 2.1 - 2/09, 1.55 - 3/10, 1.4 - 7/10; 1.63 in 04/2011  . CVA (cerebral vascular accident) 61992  . PSVT (paroxysmal supraventricular tachycardia)     s/p radiofrequency ablation  . Gout     uric acid of 10 6/11  . Diabetes mellitus     dietary management   . Anemia     hemoglobin of 11/33 in 11/09; 11/34.6 with MCV of 89 in 10/2010  . Thrombus     left upper extremity; following device insertion  . DJD (degenerative joint disease)     knees; right hip  . Chronic anticoagulation     Hemoccult-negative in 10/2010  . Coronary artery disease   . CHF (congestive heart failure)    Past Surgical History  Procedure Laterality Date  . Abdominal hysterectomy    . Bi-ventricular implantable cardioverter defibrillator  (crt-d)  4/09; 12/13/2013    MDT CRTD implanted by Dr Ladona Ridgelaylor; gen change to MDT CRTD 4-15 by Dr Ladona Ridgelaylor   Social History:  reports that she has never smoked. She has never used smokeless tobacco. She reports that she does  not drink alcohol or use illicit drugs. She is married she lives with her husband she is a retired Scientist, product/process development. She does not use home oxygen but does report getting short of breath with activities of daily living Allergies  Allergen Reactions  . Tuberculin Tests Swelling    Family History  Problem Relation Age of Onset  . Diabetes Brother   . Heart attack  Sister 24    also has liver problems      Prior to Admission medications   Medication Sig Start Date End Date Taking? Authorizing Provider  atorvastatin (LIPITOR) 20 MG tablet Take 20 mg by mouth daily. 11/28/13  Yes Marinus Maw, MD  carvedilol (COREG) 25 MG tablet Take 25 mg by mouth 2 (two) times daily with a meal.   Yes Historical Provider, MD  colchicine 0.6 MG tablet Take 0.6 mg by mouth daily.   Yes Historical Provider, MD  Febuxostat (ULORIC) 80 MG TABS Take 1 tablet by mouth daily.   Yes Historical Provider, MD  furosemide (LASIX) 80 MG tablet Take 80 mg by mouth daily.   Yes Historical Provider, MD  hydrALAZINE (APRESOLINE) 100 MG tablet Take 100 mg by mouth 3 (three) times daily.    Yes Historical Provider, MD  HYDROcodone-acetaminophen (NORCO) 7.5-325 MG per tablet Take 1 tablet by mouth every 4 (four) hours as needed for moderate pain.    Yes Historical Provider, MD  isosorbide mononitrate (IMDUR) 60 MG 24 hr tablet TAKE 2 TABLETS DAILY 01/13/14  Yes Jodelle Gross, NP  LANOXIN 125 MCG tablet TAKE 1 TABLET DAILY   Yes Jodelle Gross, NP  metFORMIN (GLUCOPHAGE) 500 MG tablet Take 500 mg by mouth 2 (two) times daily with a meal.   Yes Historical Provider, MD  metoprolol tartrate (LOPRESSOR) 25 MG tablet Take 1 tablet (25 mg total) by mouth 2 (two) times daily. 12/26/13  Yes Jodelle Gross, NP  Multiple Vitamin (MULTIVITAMIN) tablet Take 0.5 tablets by mouth daily.    Yes Historical Provider, MD  nitroGLYCERIN (NITROSTAT) 0.4 MG SL tablet Place 0.4 mg under the tongue every 5 (five) minutes as needed for chest pain.    Yes Historical Provider, MD  Omega-3 Fatty Acids (FISH OIL) 1000 MG CAPS Take 1 capsule by mouth daily.   Yes Historical Provider, MD  potassium chloride SA (K-DUR,KLOR-CON) 20 MEQ tablet Take 20-40 mEq by mouth 2 (two) times daily. Take 40 mg by mouth in the morning and 20 mg by mouth in the evening.   Yes Historical Provider, MD  warfarin (COUMADIN) 5 MG  tablet Take 5-7.5 mg by mouth daily. Take 7.5 mg by mouth on Thursday.  Take 5 mg by mouth on all other days.   Yes Historical Provider, MD   Physical Exam: Filed Vitals:   04/03/14 1230 04/03/14 1300 04/03/14 1330 04/03/14 1430  BP: 128/94 135/84 136/69 118/86  Pulse: 83 79 92 89  Temp:      TempSrc:      Resp: 28 23 22 25   Height:      Weight:      SpO2: 97% 90% 96% 93%    Wt Readings from Last 3 Encounters:  04/03/14 79.833 kg (176 lb)  12/12/13 79.833 kg (176 lb)  12/12/13 79.833 kg (176 lb)    General:  Appears calm and comfortable Eyes: PERRL, normal lids, irises & conjunctiva ENT: is clear nose without drainage oropharynx without erythema or exudate. Mucous membranes of her mouth are pink and moist  Neck: full ROM, +JVD Cardiovascular: irregularly irregular no m/r/g. No LE edema.  Respiratory: mild to moderate increased work of breathing with conversation. Rest sounds with fine crackles particularly on the right base. Otherwise air flow is fairly good I hear no wheeze no rhonchi Abdomen: soft, positive bowel sounds but sluggish mild tenderness to deep palpation in right upper quadrant otherwise nontender no guarding or rebound Skin: no rash or induration seen on limited exam Musculoskeletal: grossly normal tone BUE/BLE Psychiatric: grossly normal mood and affect, speech fluent and appropriate Neurologic: grossly non-focal. Speech is clear facial symmetry          Labs on Admission:  Basic Metabolic Panel:  Recent Labs Lab 04/03/14 1020  NA 143  K 4.0  CL 107  CO2 19  GLUCOSE 164*  BUN 38*  CREATININE 2.07*  CALCIUM 9.2   Liver Function Tests:  Recent Labs Lab 04/03/14 1056  AST 51*  ALT 46*  ALKPHOS 122*  BILITOT 3.8*  PROT 7.2  ALBUMIN 3.4*   No results found for this basename: LIPASE, AMYLASE,  in the last 168 hours No results found for this basename: AMMONIA,  in the last 168 hours CBC:  Recent Labs Lab 04/03/14 1020  WBC 8.5  NEUTROABS  6.0  HGB 11.7*  HCT 34.9*  MCV 86.6  PLT 192   Cardiac Enzymes:  Recent Labs Lab 04/03/14 1020  TROPONINI <0.30    BNP (last 3 results)  Recent Labs  04/03/14 1056  PROBNP 19030.0*   CBG: No results found for this basename: GLUCAP,  in the last 168 hours  Radiological Exams on Admission: Dg Chest 2 View  04/03/2014   CLINICAL DATA:  Shortness of breath, weakness.  EXAM: CHEST  2 VIEW  COMPARISON:  10/03/2011  FINDINGS: Left AICD remains in place, unchanged. Cardiomegaly. No overt edema. Small right pleural effusion. No confluent opacity. No acute bony abnormality.  IMPRESSION: Cardiomegaly.  Small right pleural effusion.  No overt failure.   Electronically Signed   By: Charlett Nose M.D.   On: 04/03/2014 10:51   US Abdomen Limited Ruq  04/03/2014   CLINICAL DATA:  Elevated LFTs, vomiting  EXAM: US ABDOMEN LIMITED - RIGHT UPPER QUADRANT  COMPARISON:  None.  FINDINGS: Gallbladder:  No gallstones are identified. Wall thickening is identified however with edema within the gallbladder wall. A negative sonographic Eulah Pont sign is noted.  Common bile duct:  Diameter: 3.4 mm.  Liver:  No focal lesion identified. Within normal limits in parenchymal echogenicity.  Small right-sided pleural effusion is seen.  IMPRESSION: Thickened edematous gallbladder wall without definitive gallstones.   Electronically Signed   By: Alcide Clever M.D.   On: 04/03/2014 12:13    EKG: Independently reviewed. afib  Assessment/Plan Principal Problem:   Acute on chronic systolic HF (heart failure); trigger unclear. Will admit to telemetry. Will continue IV Lasix for diuresis. Will monitor her intake and output and obtain daily weight. Chart review indicates echo a year ago with EF of 25%. Will repeat echo. Cycle CE. Continue home meds. Of note has ICD devise that was changed electively in April. May benefit from cardiology consult if no improvement.  monitor Active Problems: Cholecystitis, acute: no hx of same.  Bili. Requested general surgery consult (Dr Rayburn Ma). Will keep npo except sips until evaluated by surgery. Anti-emetic as needed    Acute renal failure superimposed on stage 3 chronic kidney disease: current creatinine slightly above baseline. Likely related to above. Will continue diuresis with close  monitoring of renal function. Monitor intake and output.     Atrial fibrillation, chronic: rate controlled. Inr 2.73. Coumadin per pharmacy. Continue home meds    Nonischemic cardiomyopathy:s/p ICD replacement 4/15. Echo 8/14 EF 25%. See #1.     Hypertension: controlled. Continue home meds    Anemia, normocytic normochromic: stable. Monitor    Chronic anticoagulation: coumadin per pharmacy  Thrombus: left upper extremity after device insertion      Dr Vanessa Barbara Team 9 [facility] Dr. Rayburn Ma General surgery  Code Status: full DVT Prophylaxis: Family Communication: husband at bedside Disposition Plan: home when ready  Time spent: 65 minutes  [facility] Pager [phone]  **Disclaimer: This note may have been dictated with voice recognition software. Similar sounding words can inadvertently be transcribed and this note may contain transcription errors which may not have been corrected upon publication of note.**

## 2014-04-03 NOTE — ED Notes (Addendum)
Pt c/o sob and nausea since yesterday. Also reports v/d x 2 episodes. Pt also reports riding to and from [place] over the weekend and being in car a total of 16 hours. Pt has history of thrombus and is currently taking coumadin.

## 2014-04-03 NOTE — Consult Note (Addendum)
Reason for Consult: cholecystitis  Referring Physician: Dr Sydell Axon is an 80 y.o. female.  HPI: 80 year old African American female transferred from [facility] because of nausea, vomiting, and volume overloaded and for evaluation for possible cholecystitis. She states that she hasn't really been feeling well for about 2 weeks. Her main complaint is poor appetite. She says really she doesn't have much abdominal pain. She states that she had nausea and vomiting over the weekend and her appetite really diminished over the weekend. She denies any fever or chills. She has been having bowel movements somewhat loose. She denies any antibiotics. She denies any significant NSAID use. She does endorse some weight loss. She is somewhat of a poor historian. She denies any acholic stools or jaundice.  Past Medical History  Diagnosis Date  . Nonischemic cardiomyopathy     Initial dx 1992; nl coronary angios  in 1/03; AICD/bivent. pacemaker - 4/09; EF 20% in '92, 15-20% in '02, 20-25% in '08, 40-45% in 6/10.; digoxin toxicity in 2009; ACE Inhibitor discontinued in 2010 during hospital admission  for low output syndrome  . Hypertension     04/2011-normal electrolytes  . Chronic kidney disease, stage II (mild)     creatinine of 1.4 - 3/08, 2.1 - 2/09, 1.55 - 3/10, 1.4 - 7/10; 1.63 in 04/2011  . CVA (cerebral vascular accident) 30  . PSVT (paroxysmal supraventricular tachycardia)     s/p radiofrequency ablation  . Gout     uric acid of 10 6/11  . Diabetes mellitus     dietary management   . Anemia     hemoglobin of 11/33 in 11/09; 11/34.6 with MCV of 89 in 10/2010  . Thrombus     left upper extremity; following device insertion  . DJD (degenerative joint disease)     knees; right hip  . Chronic anticoagulation     Hemoccult-negative in 10/2010  . Coronary artery disease   . CHF (congestive heart failure)     Past Surgical History  Procedure Laterality Date  . Abdominal  hysterectomy    . Bi-ventricular implantable cardioverter defibrillator  (crt-d)  4/09; 12/13/2013    MDT CRTD implanted by Dr Lovena Le; gen change to MDT CRTD 4-15 by Dr Lovena Le    Family History  Problem Relation Age of Onset  . Diabetes Brother   . Heart attack Sister 57    also has liver problems     Social History:  reports that she has never smoked. She has never used smokeless tobacco. She reports that she does not drink alcohol or use illicit drugs.  Allergies:  Allergies  Allergen Reactions  . Tuberculin Tests Swelling    Medications: I have reviewed the patient's current medications.  Results for orders placed during the hospital encounter of 04/03/14 (from the past 48 hour(s))  CBC WITH DIFFERENTIAL     Status: Abnormal   Collection Time    04/03/14 10:20 AM      Result Value Ref Range   WBC 8.5  4.0 - 10.5 K/uL   RBC 4.03  3.87 - 5.11 MIL/uL   Hemoglobin 11.7 (*) 12.0 - 15.0 g/dL   HCT 34.9 (*) 36.0 - 46.0 %   MCV 86.6  78.0 - 100.0 fL   MCH 29.0  26.0 - 34.0 pg   MCHC 33.5  30.0 - 36.0 g/dL   RDW 14.7  11.5 - 15.5 %   Platelets 192  150 - 400 K/uL   Neutrophils Relative %  71  43 - 77 %   Neutro Abs 6.0  1.7 - 7.7 K/uL   Lymphocytes Relative 20  12 - 46 %   Lymphs Abs 1.7  0.7 - 4.0 K/uL   Monocytes Relative 9  3 - 12 %   Monocytes Absolute 0.8  0.1 - 1.0 K/uL   Eosinophils Relative 0  0 - 5 %   Eosinophils Absolute 0.0  0.0 - 0.7 K/uL   Basophils Relative 0  0 - 1 %   Basophils Absolute 0.0  0.0 - 0.1 K/uL  BASIC METABOLIC PANEL     Status: Abnormal   Collection Time    04/03/14 10:20 AM      Result Value Ref Range   Sodium 143  137 - 147 mEq/L   Potassium 4.0  3.7 - 5.3 mEq/L   Chloride 107  96 - 112 mEq/L   CO2 19  19 - 32 mEq/L   Glucose, Bld 164 (*) 70 - 99 mg/dL   BUN 38 (*) 6 - 23 mg/dL   Creatinine, Ser 2.07 (*) 0.50 - 1.10 mg/dL   Calcium 9.2  8.4 - 10.5 mg/dL   GFR calc non Af Amer 22 (*) >90 mL/min   GFR calc Af Amer 25 (*) >90 mL/min    Comment: (NOTE)     The eGFR has been calculated using the CKD EPI equation.     This calculation has not been validated in all clinical situations.     eGFR's persistently <90 mL/min signify possible Chronic Kidney     Disease.   Anion gap 17 (*) 5 - 15  TROPONIN I     Status: None   Collection Time    04/03/14 10:20 AM      Result Value Ref Range   Troponin I <0.30  <0.30 ng/mL   Comment:            Due to the release kinetics of cTnI,     a negative result within the first hours     of the onset of symptoms does not rule out     myocardial infarction with certainty.     If myocardial infarction is still suspected,     repeat the test at appropriate intervals.  PROTIME-INR     Status: Abnormal   Collection Time    04/03/14 10:20 AM      Result Value Ref Range   Prothrombin Time 28.9 (*) 11.6 - 15.2 seconds   INR 2.73 (*) 0.00 - 1.49  PRO B NATRIURETIC PEPTIDE     Status: Abnormal   Collection Time    04/03/14 10:56 AM      Result Value Ref Range   Pro B Natriuretic peptide (BNP) 19030.0 (*) 0 - 450 pg/mL  LACTIC ACID, PLASMA     Status: None   Collection Time    04/03/14 10:56 AM      Result Value Ref Range   Lactic Acid, Venous 1.7  0.5 - 2.2 mmol/L  HEPATIC FUNCTION PANEL     Status: Abnormal   Collection Time    04/03/14 10:56 AM      Result Value Ref Range   Total Protein 7.2  6.0 - 8.3 g/dL   Albumin 3.4 (*) 3.5 - 5.2 g/dL   AST 51 (*) 0 - 37 U/L   ALT 46 (*) 0 - 35 U/L   Alkaline Phosphatase 122 (*) 39 - 117 U/L   Total Bilirubin 3.8 (*) 0.3 -  1.2 mg/dL   Bilirubin, Direct 2.2 (*) 0.0 - 0.3 mg/dL   Indirect Bilirubin 1.6 (*) 0.3 - 0.9 mg/dL  URINALYSIS, ROUTINE W REFLEX MICROSCOPIC     Status: Abnormal   Collection Time    04/03/14  5:59 PM      Result Value Ref Range   Color, Urine YELLOW  YELLOW   APPearance CLEAR  CLEAR   Specific Gravity, Urine 1.012  1.005 - 1.030   pH 5.0  5.0 - 8.0   Glucose, UA NEGATIVE  NEGATIVE mg/dL   Hgb urine dipstick TRACE  (*) NEGATIVE   Bilirubin Urine NEGATIVE  NEGATIVE   Ketones, ur NEGATIVE  NEGATIVE mg/dL   Protein, ur 30 (*) NEGATIVE mg/dL   Urobilinogen, UA 1.0  0.0 - 1.0 mg/dL   Nitrite NEGATIVE  NEGATIVE   Leukocytes, UA NEGATIVE  NEGATIVE  URINE MICROSCOPIC-ADD ON     Status: None   Collection Time    04/03/14  5:59 PM      Result Value Ref Range   Squamous Epithelial / LPF RARE  RARE   WBC, UA 0-2  <3 WBC/hpf   RBC / HPF 0-2  <3 RBC/hpf   Bacteria, UA RARE  RARE   Urine-Other AMORPHOUS URATES/PHOSPHATES      Dg Chest 2 View  04/03/2014   CLINICAL DATA:  Shortness of breath, weakness.  EXAM: CHEST  2 VIEW  COMPARISON:  10/03/2011  FINDINGS: Left AICD remains in place, unchanged. Cardiomegaly. No overt edema. Small right pleural effusion. No confluent opacity. No acute bony abnormality.  IMPRESSION: Cardiomegaly.  Small right pleural effusion.  No overt failure.   Electronically Signed   By: Rolm Baptise M.D.   On: 04/03/2014 10:51   US Abdomen Limited Ruq  04/03/2014   CLINICAL DATA:  Elevated LFTs, vomiting  EXAM: US ABDOMEN LIMITED - RIGHT UPPER QUADRANT  COMPARISON:  None.  FINDINGS: Gallbladder:  No gallstones are identified. Wall thickening is identified however with edema within the gallbladder wall. A negative sonographic Percell Miller sign is noted.  Common bile duct:  Diameter: 3.4 mm.  Liver:  No focal lesion identified. Within normal limits in parenchymal echogenicity.  Small right-sided pleural effusion is seen.  IMPRESSION: Thickened edematous gallbladder wall without definitive gallstones.   Electronically Signed   By: Inez Catalina M.D.   On: 04/03/2014 12:13    Review of Systems  Constitutional: Positive for weight loss. Negative for fever and chills.  HENT: Negative for nosebleeds.   Eyes: Negative for blurred vision.  Respiratory: Positive for shortness of breath.   Cardiovascular: Positive for leg swelling. Negative for chest pain, palpitations, orthopnea and PND.       + DOE, and some  SOB  Gastrointestinal: Positive for nausea and vomiting. Negative for diarrhea, blood in stool and melena.  Genitourinary: Negative for dysuria and hematuria.  Musculoskeletal: Negative.   Skin: Negative for itching and rash.  Neurological: Negative for dizziness, focal weakness, seizures, loss of consciousness and headaches.       Denies TIAs, amaurosis fugax  Endo/Heme/Allergies: Does not bruise/bleed easily.  Psychiatric/Behavioral: The patient is not nervous/anxious.    Blood pressure 138/109, pulse 87, temperature 97.8 F (36.6 C), temperature source Oral, resp. rate 24, height $RemoveBe'5\' 9"'[id number]$  (1.753 m), weight 176 lb (79.833 kg), SpO2 99.00%. Physical Exam  Vitals reviewed. Constitutional: She is oriented to person, place, and time. She appears well-developed and well-nourished. No distress.  Some sob while speaking with her  HENT:  Head:  Normocephalic and atraumatic.  Right Ear: External ear normal.  Left Ear: External ear normal.  Eyes: Conjunctivae are normal. No scleral icterus.  Neck: Normal range of motion. Neck supple. No tracheal deviation present. No thyromegaly present.  Cardiovascular: Normal rate and normal heart sounds.   Respiratory: Effort normal and breath sounds normal. No stridor. No respiratory distress. She has no wheezes.  GI: Soft. She exhibits no distension. There is tenderness in the right upper quadrant and epigastric area. There is no rebound and no guarding.    Musculoskeletal: She exhibits no edema and no tenderness.  Lymphadenopathy:    She has no cervical adenopathy.  Neurological: She is alert and oriented to person, place, and time. She exhibits normal muscle tone.  Skin: Skin is warm. No rash noted. She is diaphoretic (a little). No erythema. No pallor.  Psychiatric: She has a normal mood and affect. Her behavior is normal. Judgment and thought content normal.    Assessment/Plan: RUQ tenderness suggestive of acalculous cholecystitis.  Elevated  LFTs Elevated INR secondary to chronic anticoagulation non ischemic heart myopathy Acute on chroninc Systolic heart failure Acute on Chronic kidney disease  It is a little bit unusual for her to have this significant LFT elevation in the setting of No gallstones. Agree with empiric antibiotics for probable cholecystitis. Hold anticoagulation Repeat INR, CBC, comprehensive metabolic panel in the morning N.p.o. After midnight  -If liver function test increase or do not decrease will recommend a nuclear medicine scan of gallbladder &/or GI consult -if we end up recommending surgery, will needs to cardiac evaluation. If deemed high risk - may need perc drain  Leighton Ruff. Redmond Pulling, MD, FACS General, Bariatric, & Minimally Invasive Surgery [facility]  Note: This dictation was prepared with Dragon/digital dictation along with [facility] technology. Any transcriptional errors that result from this process are unintentional.    Gayland Curry 04/03/2014, 7:28 PM

## 2014-04-04 ENCOUNTER — Inpatient Hospital Stay (HOSPITAL_COMMUNITY): Payer: Medicare HMO

## 2014-04-04 DIAGNOSIS — R17 Unspecified jaundice: Secondary | ICD-10-CM

## 2014-04-04 DIAGNOSIS — Z95810 Presence of automatic (implantable) cardiac defibrillator: Secondary | ICD-10-CM

## 2014-04-04 DIAGNOSIS — R0989 Other specified symptoms and signs involving the circulatory and respiratory systems: Secondary | ICD-10-CM

## 2014-04-04 DIAGNOSIS — N179 Acute kidney failure, unspecified: Secondary | ICD-10-CM

## 2014-04-04 DIAGNOSIS — I059 Rheumatic mitral valve disease, unspecified: Secondary | ICD-10-CM

## 2014-04-04 DIAGNOSIS — R0609 Other forms of dyspnea: Secondary | ICD-10-CM

## 2014-04-04 LAB — CBC
HCT: 34.1 % — ABNORMAL LOW (ref 36.0–46.0)
HEMOGLOBIN: 11.3 g/dL — AB (ref 12.0–15.0)
MCH: 29.4 pg (ref 26.0–34.0)
MCHC: 33.1 g/dL (ref 30.0–36.0)
MCV: 88.8 fL (ref 78.0–100.0)
Platelets: 170 10*3/uL (ref 150–400)
RBC: 3.84 MIL/uL — AB (ref 3.87–5.11)
RDW: 14.8 % (ref 11.5–15.5)
WBC: 7.1 10*3/uL (ref 4.0–10.5)

## 2014-04-04 LAB — COMPREHENSIVE METABOLIC PANEL
ALBUMIN: 3.1 g/dL — AB (ref 3.5–5.2)
ALT: 38 U/L — ABNORMAL HIGH (ref 0–35)
ANION GAP: 19 — AB (ref 5–15)
AST: 37 U/L (ref 0–37)
Alkaline Phosphatase: 105 U/L (ref 39–117)
BUN: 43 mg/dL — AB (ref 6–23)
CALCIUM: 9.1 mg/dL (ref 8.4–10.5)
CO2: 20 mEq/L (ref 19–32)
Chloride: 107 mEq/L (ref 96–112)
Creatinine, Ser: 2.03 mg/dL — ABNORMAL HIGH (ref 0.50–1.10)
GFR calc Af Amer: 26 mL/min — ABNORMAL LOW (ref >90)
GFR calc non Af Amer: 22 mL/min — ABNORMAL LOW (ref >90)
Glucose, Bld: 86 mg/dL (ref 70–99)
Potassium: 3.4 mEq/L — ABNORMAL LOW (ref 3.7–5.3)
SODIUM: 146 meq/L (ref 137–147)
TOTAL PROTEIN: 6.4 g/dL (ref 6.0–8.3)
Total Bilirubin: 2.1 mg/dL — ABNORMAL HIGH (ref 0.3–1.2)

## 2014-04-04 LAB — GLUCOSE, CAPILLARY
GLUCOSE-CAPILLARY: 108 mg/dL — AB (ref 70–99)
GLUCOSE-CAPILLARY: 85 mg/dL (ref 70–99)
Glucose-Capillary: 88 mg/dL (ref 70–99)
Glucose-Capillary: 90 mg/dL (ref 70–99)

## 2014-04-04 LAB — PROTIME-INR
INR: 2.28 — ABNORMAL HIGH (ref 0.00–1.49)
Prothrombin Time: 25.1 seconds — ABNORMAL HIGH (ref 11.6–15.2)

## 2014-04-04 MED ORDER — POTASSIUM CHLORIDE CRYS ER 20 MEQ PO TBCR
20.0000 meq | EXTENDED_RELEASE_TABLET | Freq: Two times a day (BID) | ORAL | Status: DC
  Administered 2014-04-04 – 2014-04-08 (×9): 20 meq via ORAL
  Filled 2014-04-04 (×13): qty 1

## 2014-04-04 MED ORDER — FUROSEMIDE 10 MG/ML IJ SOLN
80.0000 mg | Freq: Every day | INTRAMUSCULAR | Status: DC
  Administered 2014-04-05: 80 mg via INTRAVENOUS
  Filled 2014-04-04: qty 8

## 2014-04-04 MED ORDER — TECHNETIUM TC 99M MEBROFENIN IV KIT
5.0000 | PACK | Freq: Once | INTRAVENOUS | Status: AC | PRN
  Administered 2014-04-04: 5 via INTRAVENOUS

## 2014-04-04 MED ORDER — WARFARIN SODIUM 5 MG PO TABS
5.0000 mg | ORAL_TABLET | Freq: Once | ORAL | Status: AC
Start: 2014-04-04 — End: 2014-04-04
  Administered 2014-04-04: 5 mg via ORAL
  Filled 2014-04-04: qty 1

## 2014-04-04 MED ORDER — WARFARIN - PHARMACIST DOSING INPATIENT
Freq: Every day | Status: DC
  Administered 2014-04-05 – 2014-04-21 (×8)

## 2014-04-04 MED ORDER — COLCHICINE 0.6 MG PO TABS
0.3000 mg | ORAL_TABLET | Freq: Every day | ORAL | Status: DC
  Administered 2014-04-04 – 2014-04-21 (×18): 0.3 mg via ORAL
  Filled 2014-04-04 (×19): qty 0.5

## 2014-04-04 NOTE — Evaluation (Signed)
Physical Therapy Evaluation Patient Details Name: [name] MRN: [id number] DOB: 11-23-1932 Today's Date: 04/04/2014   History of Present Illness  Pt is a 80 y/o female admitted s/p complaints of nausea, vomiting, abdominal pain. Ultrasound of right upper quadrant indicates thickened gallbladder. Pt was admitted for surgical evaluation for cholecystitis.  Clinical Impression  Pt admitted with the above. Pt currently with functional limitations due to the deficits listed below (see PT Problem List). At the time of PT eval pt with functional decline - unable to don socks independently, and although usually uses SPC when in community, RW was more appropriate for safety. Pt will benefit from skilled PT to increase their independence and safety with mobility to allow discharge to the venue listed below.       Follow Up Recommendations Home health PT;Supervision for mobility/OOB    Equipment Recommendations  None recommended by PT    Recommendations for Other Services OT consult     Precautions / Restrictions Precautions Precautions: Fall Restrictions Weight Bearing Restrictions: No      Mobility  Bed Mobility Overal bed mobility: Needs Assistance Bed Mobility: Supine to Sit     Supine to sit: Supervision     General bed mobility comments: Pt able to transition to EOB with no physical assist from therapist, and minimal use of bed rails for support.   Transfers Overall transfer level: Needs assistance Equipment used: Rolling walker (2 wheeled) Transfers: Sit to/from Stand Sit to Stand: Min guard         General transfer comment: VC's for hand placement on seated surface for safety.   Ambulation/Gait Ambulation/Gait assistance: Min guard Ambulation Distance (Feet): 60 Feet Assistive device: Rolling walker (2 wheeled) Gait Pattern/deviations: Step-through pattern;Decreased stride length;Trunk flexed;Narrow base of support Gait velocity: Decreased Gait velocity  interpretation: Below normal speed for age/gender General Gait Details: VC's for energy conservation and improved posture. Pt fatigues quickly but declines standing rest breaks.   Stairs            Wheelchair Mobility    Modified Rankin (Stroke Patients Only)       Balance Overall balance assessment: Needs assistance Sitting-balance support: Feet supported;No upper extremity supported Sitting balance-Leahy Scale: Good     Standing balance support: Bilateral upper extremity supported;During functional activity Standing balance-Leahy Scale: Fair Standing balance comment: Feel pt could manage static standing without UE assist, however during dynamic/functional tasks, requires at least 1 UE for support.                              Pertinent Vitals/Pain Vitals stable throughout session. Pt reports fatigue after gait training.     Home Living Family/patient expects to be discharged to:: Private residence Living Arrangements: Spouse/significant other Available Help at Discharge: Family;Available 24 hours/day Type of Home: House Home Access: Stairs to enter Entrance Stairs-Rails:  (Not rails, but have something to hold onto) Entrance Stairs-Number of Steps: 3 Home Layout: Laundry or work area in basement;Two level Home Equipment: Walker - 2 wheels;Bedside commode;Cane - single point;Shower seat      Prior Function Level of Independence: Independent with assistive device(s)         Comments: Uses cane inside occasionally, all the time when out in the community. Cooks, cleans, drives. Husband does the grocery shopping.      Hand Dominance   Dominant Hand: Right    Extremity/Trunk Assessment   Upper Extremity Assessment: Defer to OT evaluation  Lower Extremity Assessment: Generalized weakness      Cervical / Trunk Assessment: Normal  Communication   Communication: No difficulties  Cognition Arousal/Alertness: Awake/alert Behavior  During Therapy: WFL for tasks assessed/performed Overall Cognitive Status: Within Functional Limits for tasks assessed                      General Comments      Exercises        Assessment/Plan    PT Assessment Patient needs continued PT services  PT Diagnosis Difficulty walking;Generalized weakness   PT Problem List Decreased strength;Decreased range of motion;Decreased activity tolerance;Decreased balance;Decreased mobility;Decreased knowledge of use of DME;Decreased safety awareness;Decreased knowledge of precautions;Cardiopulmonary status limiting activity  PT Treatment Interventions DME instruction;Gait training;Stair training;Functional mobility training;Therapeutic activities;Therapeutic exercise;Neuromuscular re-education;Patient/family education   PT Goals (Current goals can be found in the Care Plan section) Acute Rehab PT Goals Patient Stated Goal: To return to her home independently PT Goal Formulation: With patient/family Time For Goal Achievement: 04/11/14 Potential to Achieve Goals: Good    Frequency Min 3X/week   Barriers to discharge        Co-evaluation               End of Session Equipment Utilized During Treatment: Gait belt Activity Tolerance: Patient limited by fatigue Patient left: in chair;with call bell/phone within reach;with family/visitor present Nurse Communication: Mobility status         Time: [id number] PT Time Calculation (min): 26 min   Charges:   PT Evaluation $Initial PT Evaluation Tier I: 1 Procedure PT Treatments $Therapeutic Activity: 8-22 mins   PT G CodesRuthann Cancer:          [name], Jacquelyn Antony 04/04/2014, 5:21 PM  Ruthann CancerLaura [name], PT, DPT Acute Rehabilitation Services Pager: [phone]

## 2014-04-04 NOTE — Progress Notes (Signed)
*  PRELIMINARY RESULTS* Echocardiogram 2D Echocardiogram has been performed.  [name] 04/04/2014, 2:33 PM

## 2014-04-04 NOTE — Progress Notes (Signed)
PT Cancellation Note  Patient Details Name: [name] MRN: [id number] DOB: 1932-10-22   Cancelled Treatment:    Reason Eval/Treat Not Completed: Patient at procedure or test/unavailable. Will continue to follow and check back this afternoon, time permitting.    Ruthann Cancer 04/04/2014, 2:53 PM  Ruthann Cancer, PT, DPT Acute Rehabilitation Services Pager: [phone]

## 2014-04-04 NOTE — Care Management Note (Addendum)
  Page 2 of 2   04/21/2014     1:47:05 PM CARE MANAGEMENT NOTE 04/21/2014  Patient:  ASHLEY, BULTEMA   Account Number:  [id number]  Date Initiated:  04/04/2014  Documentation initiated by:  [facility]  Subjective/Objective Assessment:   80 year old female who presents to the emergency room with complaints of nausea, vomiting, abdominal pain.// Home with spouse.     Action/Plan:   IV abx; surgery consult.//Access for [facility] services.   Anticipated DC Date:  04/09/2014   Anticipated DC Plan:  [place]  CM consult  Medication Assistance      Choice offered to / List presented to:          [facility] arranged  HH-1 RN  [place] [facility]      [place].   Status of service:  Completed, signed off Medicare Important Message given?  YES (If response is "NO", the following Medicare IM given date fields will be blank) Date Medicare IM given:  04/20/2014 Medicare IM given by:  Shifra Swartzentruber Date Additional Medicare IM given:  04/14/2014 Additional Medicare IM given by:  CAMELLIA WOOD  Discharge Disposition:  [place]  Per UR Regulation:  Reviewed for med. necessity/level of care/duration of stay  If discussed at [place] of Stay Meetings, dates discussed:   04/13/2014  04/11/2014  04/06/2014  04/04/2014    Comments:  Donella Stade Keymoni Mccaster RN, BSN, MSHL, CCM  Nurse - Case Manager,  (Unit [place])  [phone]  04/21/2014 Home IV MED MGMT Referral sent to [facility] Dispo Plan:  Home with HHS:  RN, PT and IV Milrinone ([facility] notified of HHS order) PICC 04/19/14 CHF clinic: Dr. Corbin Ade RN, BSN, MSHL, CCM  Nurse - Case Manager,  ([facility])  [phone]  04/18/2014 hx/o  failed milrinone wean and milrionone and Lasix resumed. Disposition Plan:  LTAC Screen v SNF   04/17/2014 1357  Date Additional Medicare IM given: 04/17/2014 Additional Medicare IM given  by:  Lenox Ponds RN CCM Case Mgmt phone [phone]     04/14/14 Steward, RN, BSN, [place] [phone] CSW met with patient 04/13/14- she is now agreeable to short term SNF- has been admantly refusing in the past. She lives in [place] with her husband Gwyndolyn Saxon and wants to return home as soon as possible  CSW discussed with Dr. Maryland Pink- may be medically stable today for d/c. Patient is requesting the [facility].   04/10/14 Camellia J. Clydene Laming, RN, [place], [place] [phone] Medicare Important Message given 04/06/14;  04/10/14 .   04/07/14 Robbins, RN, BSN, NCM [phone] Charissa Bash  has presented today for surgery, with the diagnosis of heart failure The various methods of treatment have been discussed with the patient and family. After consideration of risks, benefits and other options for treatment, the patient has consented to  Procedure(s): RIGHT HEART CATH  as a surgical intervention .  04/06/14 Ulmer, RN, BSN, [employer] [phone] Spoke to pt at bedside 04/05/14 regarding discharge planning, pt dyspenic at rest and adomately declines home health services.  In to see pt 04/06/14 again to  recommend that this patient have [facility] but she declines at this time. I have discussed the risks and benefits of this service with her. The patient verbalizes understanding.

## 2014-04-04 NOTE — Progress Notes (Signed)
No stones on u/s.  HIDA normal - no evidence of cholecystitis.   Not sure of etiology of increased LFTs rec following LFTs til normalize If don't normalize rec GI medicine consult Since nothing to add from surgical standpoint, will sign off Please call with questions.   Mary Sella. Andrey Campanile, MD, FACS General, Bariatric, & Minimally Invasive Surgery [facility], [place]

## 2014-04-04 NOTE — Progress Notes (Signed)
Patient ID: [name], female   DOB: 04/21/1933, 80 y.o.   MRN: [id number]    Subjective: Pt without complaints.  No abdominal pain.  No further nausea  Objective: Vital signs in last 24 hours: Temp:  [97.4 F (36.3 C)-99.4 F (37.4 C)] 97.4 F (36.3 C) (08/04 0509) Pulse Rate:  [79-98] 84 (08/04 0509) Resp:  [20-32] 20 (08/04 0509) BP: (98-146)/(54-112) 117/63 mmHg (08/04 0509) SpO2:  [90 %-100 %] 98 % (08/04 0509) Weight:  [176 lb (79.833 kg)-180 lb 5.4 oz (81.8 kg)] 180 lb 5.4 oz (81.8 kg) (08/04 0509) Last BM Date: 04/04/14  Intake/Output from previous day: 08/03 0701 - 08/04 0700 In: 3 [I.V.:3] Out: 2500 [Urine:2500] Intake/Output this shift:    PE: Abd: soft, NT, ND, +BS Heart: regular   Lab Results:   Recent Labs  04/03/14 1020 04/04/14 0520  WBC 8.5 7.1  HGB 11.7* 11.3*  HCT 34.9* 34.1*  PLT 192 170   BMET  Recent Labs  04/03/14 1020 04/04/14 0520  NA 143 146  K 4.0 3.4*  CL 107 107  CO2 19 20  GLUCOSE 164* 86  BUN 38* 43*  CREATININE 2.07* 2.03*  CALCIUM 9.2 9.1   PT/INR  Recent Labs  04/03/14 1020 04/04/14 0520  LABPROT 28.9* 25.1*  INR 2.73* 2.28*   CMP     Component Value Date/Time   NA 146 04/04/2014 0520   K 3.4* 04/04/2014 0520   CL 107 04/04/2014 0520   CO2 20 04/04/2014 0520   GLUCOSE 86 04/04/2014 0520   BUN 43* 04/04/2014 0520   CREATININE 2.03* 04/04/2014 0520   CREATININE 1.97* 12/10/2013 0958   CALCIUM 9.1 04/04/2014 0520   PROT 6.4 04/04/2014 0520   ALBUMIN 3.1* 04/04/2014 0520   AST 37 04/04/2014 0520   ALT 38* 04/04/2014 0520   ALKPHOS 105 04/04/2014 0520   BILITOT 2.1* 04/04/2014 0520   GFRNONAA 22* 04/04/2014 0520   GFRAA 26* 04/04/2014 0520   Lipase  No results found for this basename: lipase       Studies/Results: Dg Chest 2 View  04/03/2014   CLINICAL DATA:  Shortness of breath, weakness.  EXAM: CHEST  2 VIEW  COMPARISON:  10/03/2011  FINDINGS: Left AICD remains in place, unchanged. Cardiomegaly. No overt edema. Small  right pleural effusion. No confluent opacity. No acute bony abnormality.  IMPRESSION: Cardiomegaly.  Small right pleural effusion.  No overt failure.   Electronically Signed   By: Charlett Nose M.D.   On: 04/03/2014 10:51   US Abdomen Limited Ruq  04/03/2014   CLINICAL DATA:  Elevated LFTs, vomiting  EXAM: US ABDOMEN LIMITED - RIGHT UPPER QUADRANT  COMPARISON:  None.  FINDINGS: Gallbladder:  No gallstones are identified. Wall thickening is identified however with edema within the gallbladder wall. A negative sonographic Eulah Pont sign is noted.  Common bile duct:  Diameter: 3.4 mm.  Liver:  No focal lesion identified. Within normal limits in parenchymal echogenicity.  Small right-sided pleural effusion is seen.  IMPRESSION: Thickened edematous gallbladder wall without definitive gallstones.   Electronically Signed   By: Alcide Clever M.D.   On: 04/03/2014 12:13    Anti-infectives: Anti-infectives   None       Assessment/Plan  1. Elevated LFTs 2. N/V, improved 3. CHF 4. NICM  Plan: 1. Pt's LFTs have trended down some, TB 2.1.  Pt without pain  Today.  Will order a HIDA scan to be evaluate the gb for cholecystitis.  Keep NPO  and without narcs until after test.   LOS: 1 day    Jaidence Geisler E 04/04/2014, 7:51 AM Pager: [phone]

## 2014-04-04 NOTE — Progress Notes (Signed)
Nutrition Brief Note  Patient identified on the Malnutrition Screening Tool (MST) Report  Wt Readings from Last 15 Encounters:  04/04/14 180 lb 5.4 oz (81.8 kg)  12/12/13 176 lb (79.833 kg)  12/12/13 176 lb (79.833 kg)  11/28/13 179 lb (81.194 kg)  03/29/13 186 lb (84.369 kg)  02/23/13 176 lb (79.833 kg)  08/17/12 191 lb (86.637 kg)  08/02/12 190 lb 1.9 oz (86.238 kg)  10/13/11 194 lb (87.998 kg)  08/07/11 197 lb (89.359 kg)  04/14/11 193 lb 1.9 oz (87.599 kg)  10/08/10 202 lb (91.627 kg)  08/20/10 206 lb (93.441 kg)  05/31/10 205 lb (92.987 kg)  02/05/10 196 lb (88.905 kg)    Body mass index is 26.62 kg/(m^2). Patient meets criteria for Overweight based on current BMI. Pt reports losing 10 lbs in the past year; weight loss not significant for time frame. She reports following a No Added Salt diet PTA and eating well with 3 meals daily.   Current diet order is NPO. Pt states her appetite is good and she will eat well once her diet is advanced. She denies any diet education needs at this time. Labs and medications reviewed.   No nutrition interventions warranted at this time. If nutrition issues arise, please consult RD.   Ian Malkin RD, LDN Inpatient Clinical Dietitian Pager: [phone] After Hours Pager: [phone]

## 2014-04-04 NOTE — Progress Notes (Signed)
[facility] PROGRESS NOTE  [name] LNL:[id number] DOB: February 15, 1933 DOA: 04/03/2014 PCP: Brandi Hampshire, MD Interim Summary Patient is a pleasant 80 year old female with a past medical history of chronic systolic congestive heart failure having an ejection fraction of 20-25%, atrial fibrillation currently on anticoagulation who presented to the emergency room at [facility] on 04/03/2014 complaining of shortness of breath, nausea vomiting and right upper quadrant pain. Initial workup in the emergency room included a chest x-ray that showed cardiomegaly without overt failure, BNP of 19,030, Creatinine of 2.07. She was also found to have an elevated bilirubin of 3.0 and alk phos of 122. RUQ US showing thickened gallbladder. She was transferred from [facility] to [facility] for surgical evaluation. Liver enzymes today trending down. HIDA scan did not show evidence of cholecystitis or biliary obstruction.With regard to CHF she was diureses with Lasix 80 mg IV BID having a urinary output of 2.5L.    Assessment/Plan: 1. Right Upper Quadrant Pain.  -Initially there was concern for a cute cholecystitis with her u/s showing thickened gallbladder wal -She was seen and evaluated by surgery as a HIDA scan did not reveal evidence of cholecystitis.  -Liver enzymes trending down.  2. Acute on Chronic Systolic Congestive Heart Failure -Patient presented with signs symptoms consistent with acute CHF. She had a transthoracic echocardiogram that was performed on 04/05/2013 showing an ejection fraction of 20-25% with diffuse hypokinesis. -Repeat transthoracic echocardiogram pending at the time of this dictation -She was started on 80 mg IV twice a day of Lasix with good urine output. Overnight had a urine output of 2.5 L, currently at a net negative fluid balance of 4.1 L Continue Lasix at $Remove'80mg'PiOesPJ$  IV q daily -Will likely transition to oral Lasix in the next 24 hours -Follow up on echo  results  3. Stage III chronic kidney disease -Patient's baseline creatinine near 1.7-1.9 -A.M. lab work showing creatinine of 2.03, will continue to monitor her kidney function closely as she is on IV diuretic therapy for acute decompensated congestive heart failure  4. Hypokalemia -Likely secondary to IV Lasix, was administered potassium replacement  5. History of chronic atrial fibrillation -Presently rate controlled with heart rates in the 80s to 90s -Continue metoprolol  25 mg BID, carvedilol 25 mg twice a day and digoxin 0.125 mg by mouth daily  6. Anticoagulation -Patient's INR therapeutic at 2.28 -Pharmacy consult for warfarin dosing  Code Status: Full Code Family Communication:  Disposition Plan: Continue IV diuresis, will likely be discharged home when medically stable.    Consultants:  Surgery  Procedures:  HIDA scan Impression:  Normal hepatobiliary nuclear medicine scan demonstrating no evidence  of cholecystitis or biliary obstruction. Ultrasound findings may  have related to relative contraction of the gallbladder at the time  of imaging   HPI/Subjective: Patient reports feeling better today, breathing easier. Denies fevers, chills, nausea vomiting.   Objective: Filed Vitals:   04/04/14 1439  BP: 107/63  Pulse: 83  Temp: 97.6 F (36.4 C)  Resp: 20    Intake/Output Summary (Last 24 hours) at 04/04/14 1442 Last data filed at 04/04/14 1435  Gross per 24 hour  Intake      3 ml  Output   3651 ml  Net  -3648 ml   Filed Weights   04/03/14 1012 04/04/14 0509  Weight: 79.833 kg (176 lb) 81.8 kg (180 lb 5.4 oz)    Exam:   General:  No acute distress, awake and alert, following commands,  no acute distress  Cardiovascular: Regular rate and rhythm, normal S1S2, no extremity edema  Respiratory: She has a few bilateral crackles, normal inspiratory effort  Abdomen: Soft, nondistended, having right upper quadrant pain with palpation, no rebound  tenderness  Musculoskeletal: No edema   Data Reviewed: Basic Metabolic Panel:  Recent Labs Lab 04/03/14 1020 04/04/14 0520  NA 143 146  K 4.0 3.4*  CL 107 107  CO2 19 20  GLUCOSE 164* 86  BUN 38* 43*  CREATININE 2.07* 2.03*  CALCIUM 9.2 9.1   Liver Function Tests:  Recent Labs Lab 04/03/14 1056 04/04/14 0520  AST 51* 37  ALT 46* 38*  ALKPHOS 122* 105  BILITOT 3.8* 2.1*  PROT 7.2 6.4  ALBUMIN 3.4* 3.1*   No results found for this basename: LIPASE, AMYLASE,  in the last 168 hours No results found for this basename: AMMONIA,  in the last 168 hours CBC:  Recent Labs Lab 04/03/14 1020 04/04/14 0520  WBC 8.5 7.1  NEUTROABS 6.0  --   HGB 11.7* 11.3*  HCT 34.9* 34.1*  MCV 86.6 88.8  PLT 192 170   Cardiac Enzymes:  Recent Labs Lab 04/03/14 1020 04/03/14 1850  TROPONINI <0.30 <0.30   BNP (last 3 results)  Recent Labs  04/03/14 1056  PROBNP 19030.0*   CBG:  Recent Labs Lab 04/04/14 0036 04/04/14 0512  GLUCAP 85 88    No results found for this or any previous visit (from the past 240 hour(s)).   Studies: Dg Chest 2 View  04/03/2014   CLINICAL DATA:  Shortness of breath, weakness.  EXAM: CHEST  2 VIEW  COMPARISON:  10/03/2011  FINDINGS: Left AICD remains in place, unchanged. Cardiomegaly. No overt edema. Small right pleural effusion. No confluent opacity. No acute bony abnormality.  IMPRESSION: Cardiomegaly.  Small right pleural effusion.  No overt failure.   Electronically Signed   By: [name] M.D.   On: 04/03/2014 10:51   Nm Hepatobiliary  04/04/2014   CLINICAL DATA:  Nausea, vomiting and thickened gallbladder wall by ultrasound.  EXAM: NUCLEAR MEDICINE HEPATOBILIARY IMAGING  TECHNIQUE: Sequential images of the abdomen were obtained out to 60 minutes following intravenous administration of radiopharmaceutical.  RADIOPHARMACEUTICALS:  5.0 Millicurie DG-38V Choletec  COMPARISON:  Right upper quadrant ultrasound on 04/03/2014  FINDINGS: There is  normal hepatic uptake of radiopharmaceutical. Normal visualization of the gallbladder at 15 min. There is no evidence of biliary obstruction with activity noted to transit throughout the small bowel.  IMPRESSION: Normal hepatobiliary nuclear medicine scan demonstrating no evidence of cholecystitis or biliary obstruction. Ultrasound findings may have related to relative contraction of the gallbladder at the time of imaging.   Electronically Signed   By: Aletta Edouard M.D.   On: 04/04/2014 12:42   US Abdomen Limited Ruq  04/03/2014   CLINICAL DATA:  Elevated LFTs, vomiting  EXAM: US ABDOMEN LIMITED - RIGHT UPPER QUADRANT  COMPARISON:  None.  FINDINGS: Gallbladder:  No gallstones are identified. Wall thickening is identified however with edema within the gallbladder wall. A negative sonographic Percell Miller sign is noted.  Common bile duct:  Diameter: 3.4 mm.  Liver:  No focal lesion identified. Within normal limits in parenchymal echogenicity.  Small right-sided pleural effusion is seen.  IMPRESSION: Thickened edematous gallbladder wall without definitive gallstones.   Electronically Signed   By: Inez Catalina M.D.   On: 04/03/2014 12:13    Scheduled Meds: . atorvastatin  20 mg Oral Daily  . carvedilol  25 mg  Oral BID WC  . colchicine  0.3 mg Oral Daily  . digoxin  0.125 mg Oral Daily  . Febuxostat  1 tablet Oral Daily  . furosemide  80 mg Intravenous BID  . hydrALAZINE  100 mg Oral TID  . insulin aspart  0-15 Units Subcutaneous TID WC  . insulin aspart  0-5 Units Subcutaneous QHS  . isosorbide mononitrate  60 mg Oral Daily  . metoprolol  2.5 mg Intravenous Once  . metoprolol tartrate  25 mg Oral BID  . potassium chloride SA  20 mEq Oral BID  . sodium chloride  3 mL Intravenous Q12H  . sodium chloride  3 mL Intravenous Q12H   Continuous Infusions:   Principal Problem:   Acute on chronic systolic HF (heart failure) Active Problems:   Atrial fibrillation, chronic   Nonischemic cardiomyopathy    Hypertension   Anemia, normocytic normochromic   Chronic anticoagulation   Cholecystitis, acute   Acute renal failure superimposed on stage 3 chronic kidney disease   Acute on chronic systolic heart failure   Dyspnea    Time spent: 35 min    Kelvin Cellar  [facility] Pager [phone]. If 7PM-7AM, please contact night-coverage at [email or web address], password [facility] 04/04/2014, 2:42 PM  LOS: 1 day

## 2014-04-04 NOTE — Progress Notes (Addendum)
ANTICOAGULATION CONSULT NOTE - Follow-up Consult  Pharmacy Consult for coumadin Indication: atrial fibrillation  Allergies  Allergen Reactions  . Tuberculin Tests Swelling    Patient Measurements: Height: 5\' 9"  (175.3 cm) Weight: 180 lb 5.4 oz (81.8 kg) IBW/kg (Calculated) : 66.2   Vital Signs: Temp: 97.4 F (36.3 C) (08/04 0509) Temp src: Oral (08/04 0509) BP: 117/63 mmHg (08/04 0509) Pulse Rate: 84 (08/04 0509)  Labs:  Recent Labs  04/03/14 1020 04/03/14 1850 04/04/14 0520  HGB 11.7*  --  11.3*  HCT 34.9*  --  34.1*  PLT 192  --  170  LABPROT 28.9*  --  25.1*  INR 2.73*  --  2.28*  CREATININE 2.07*  --  2.03*  TROPONINI <0.30 <0.30  --     Estimated Creatinine Clearance: 26.1 ml/min (by C-G formula based on Cr of 2.03).   Assessment: 80 yo female transferred from [facility] 8/3 for surgical evaluation for possible cholecystitis. Coumadin on hold in case surgery needed. Plan for HIDA scan today. INR down to 2.28.  Home dose: 5mg  daily except for 7.5mg  on Thur. Last dose 7/31.  Goal of Therapy:  Monitor platelets by anticoagulation protocol: Yes   Plan:  Hold coumadin for now F/u surgical plans  Christoper Fabian, PharmD, BCPS Clinical pharmacist, pager [phone] 04/04/2014,11:05 AM  Addendum:  HIDA scan negative so unlikely pt will need surgery. Dr. Vanessa Barbara wants to restart coumadin tonight.  Plan: 1) Daily INR 2) Coumadin 5mg  today  Christoper Fabian, PharmD, BCPS Clinical pharmacist, pager [phone] 04/04/2014  3:06 PM

## 2014-04-05 DIAGNOSIS — D649 Anemia, unspecified: Secondary | ICD-10-CM

## 2014-04-05 DIAGNOSIS — I5043 Acute on chronic combined systolic (congestive) and diastolic (congestive) heart failure: Principal | ICD-10-CM

## 2014-04-05 LAB — BASIC METABOLIC PANEL
Anion gap: 17 — ABNORMAL HIGH (ref 5–15)
BUN: 52 mg/dL — AB (ref 6–23)
CO2: 21 meq/L (ref 19–32)
Calcium: 8.7 mg/dL (ref 8.4–10.5)
Chloride: 106 mEq/L (ref 96–112)
Creatinine, Ser: 1.86 mg/dL — ABNORMAL HIGH (ref 0.50–1.10)
GFR calc non Af Amer: 25 mL/min — ABNORMAL LOW (ref >90)
GFR, EST AFRICAN AMERICAN: 29 mL/min — AB (ref >90)
Glucose, Bld: 87 mg/dL (ref 70–99)
POTASSIUM: 3.3 meq/L — AB (ref 3.7–5.3)
Sodium: 144 mEq/L (ref 137–147)

## 2014-04-05 LAB — GLUCOSE, CAPILLARY: GLUCOSE-CAPILLARY: 94 mg/dL (ref 70–99)

## 2014-04-05 LAB — CBC
HEMATOCRIT: 34.5 % — AB (ref 36.0–46.0)
Hemoglobin: 11.4 g/dL — ABNORMAL LOW (ref 12.0–15.0)
MCH: 29.1 pg (ref 26.0–34.0)
MCHC: 33 g/dL (ref 30.0–36.0)
MCV: 88 fL (ref 78.0–100.0)
Platelets: 199 10*3/uL (ref 150–400)
RBC: 3.92 MIL/uL (ref 3.87–5.11)
RDW: 14.6 % (ref 11.5–15.5)
WBC: 6.4 10*3/uL (ref 4.0–10.5)

## 2014-04-05 LAB — PRO B NATRIURETIC PEPTIDE: PRO B NATRI PEPTIDE: 5310 pg/mL — AB (ref 0–450)

## 2014-04-05 LAB — TROPONIN I: Troponin I: <0.3 ng/mL (ref <0.30)

## 2014-04-05 MED ORDER — FUROSEMIDE 10 MG/ML IJ SOLN
80.0000 mg | Freq: Two times a day (BID) | INTRAMUSCULAR | Status: DC
  Administered 2014-04-05 – 2014-04-07 (×4): 80 mg via INTRAVENOUS
  Filled 2014-04-05 (×5): qty 8

## 2014-04-05 MED ORDER — CARVEDILOL 6.25 MG PO TABS
6.2500 mg | ORAL_TABLET | Freq: Two times a day (BID) | ORAL | Status: DC
  Administered 2014-04-05 – 2014-04-10 (×10): 6.25 mg via ORAL
  Filled 2014-04-05 (×14): qty 1

## 2014-04-05 MED ORDER — CARVEDILOL 12.5 MG PO TABS
12.5000 mg | ORAL_TABLET | Freq: Two times a day (BID) | ORAL | Status: DC
  Filled 2014-04-05 (×2): qty 1

## 2014-04-05 MED ORDER — CARVEDILOL 25 MG PO TABS
25.0000 mg | ORAL_TABLET | Freq: Two times a day (BID) | ORAL | Status: DC
  Administered 2014-04-05: 25 mg via ORAL
  Filled 2014-04-05 (×3): qty 1

## 2014-04-05 MED ORDER — WARFARIN SODIUM 5 MG PO TABS
5.0000 mg | ORAL_TABLET | Freq: Once | ORAL | Status: AC
  Filled 2014-04-05 (×2): qty 1

## 2014-04-05 NOTE — Progress Notes (Signed)
1320 feeling better with Fritch 2 . o2 sat 100 % Bp low Dr . Sunnie Nielsen . Repeat  Bp  Done and charted . Dr Sunnie Nielsen aware

## 2014-04-05 NOTE — Progress Notes (Signed)
PHARMACIST - PHYSICIAN COMMUNICATION DR:      DESCRIPTION: Pt is on two beta blockers PTA (carvedilol and metoprolol) and both have been resumed as inpt.  Consider maximizing one BB and adding additional therapy if needed from another class.  Thank you.   Vernard Gambles, PharmD, BCPS 04/05/2014 5:22 AM

## 2014-04-05 NOTE — Plan of Care (Signed)
Problem: Phase I Progression Outcomes Goal: EF % per last Echo/documented,Core Reminder form on chart Outcome: Completed/Met Date Met:  04/05/14 EF 20-25% per ECHO performed on 04/04/14.

## 2014-04-05 NOTE — Consult Note (Addendum)
Advanced Heart Failure Team History and Physical Note   Primary Physician: Alice Reichert, MD   Primary Cardiologist:  Dr. Ladona Ridgel  Reason for Admission: A/C HF   HPI:    [name] is a 80 yo female with a history of NICM (EF 15-20%) s/p BiV ICD, HTN, chronic systolic HF, afib, CVA, DM2 and CKD stage IV.  She presented to [facility] ED with 2d history of persistent nausea and vomiting, abdominal pain, loss of appetitie and worsening SOB. Pertinent labs in the ED were creatinine 2.7, alkaline phosphatase 122, AST 51, ALT, 46 total bili 3.8, proBNP 19,030 and lactic acid 1.7. Chest x-ray Cardiomegaly. Small right pleural effusion. Concern for cholecystitis d/t thickened edematous gallbladder wall without definitive gallstones. Surgery consulted and started on empiric abx. She was started on IV diuretics and admitted to the hospital.   Reports since April when she got her BiV that she has not done that well. She is only able to walk about 100 ft before having to stop to get her breath. She is active at home and able to perform all ADLs and cook but has to take breaks. She reports CP that lasts a couple of seconds that comes and goes and happens at least once a week. Does not weigh daily and reports that weight at home has actually been trending down.  ECHO: EF 15-20%, global HK, grade III DD, severe MR, RA mod dilated, mod TR, RV fx nl  Review of Systems: [y] = yes, [ ]  = no   General: Weight gain [ ] ; Weight loss [Y ]; Anorexia [ ] ; Fatigue [Y ]; Fever [ ] ; Chills Klaus.Mock ]; Weakness [Y ]  Cardiac: Chest pain/pressure [Y ]; Resting SOB [ ] ; Exertional SOB [Y ]; Orthopnea [ ] ; Pedal Edema Klaus.Mock ]; Palpitations [ Y]; Syncope [ ] ; Presyncope [ ] ; Paroxysmal nocturnal dyspnea[ ]   Pulmonary: Cough [ ] ; Wheezing[ ] ; Hemoptysis[ ] ; Sputum [ ] ; Snoring [ ]   GI: Vomiting[ ] ; Dysphagia[ ] ; Melena[ ] ; Hematochezia [ ] ; Heartburn[ ] ; Abdominal pain [ Y]; Constipation [ ] ; Diarrhea [ ] ; BRBPR [ ]   GU:  Hematuria[ ] ; Dysuria [ ] ; Nocturia[ ]   Vascular: Pain in legs with walking [ ] ; Pain in feet with lying flat [ ] ; Non-healing sores [ ] ; Stroke [ ] ; TIA [ ] ; Slurred speech [ ] ;  Neuro: Headaches[ ] ; Vertigo[ ] ; Seizures[ ] ; Paresthesias[ ] ;Blurred vision [ ] ; Diplopia [ ] ; Vision changes [ ]   Ortho/Skin: Arthritis [ ] ; Joint pain [Y ]; Muscle pain [ ] ; Joint swelling [ ] ; Back Pain [ ] ; Rash [ ]   Psych: Depression[ ] ; Anxiety[ ]   Heme: Bleeding problems [ ] ; Clotting disorders [ ] ; Anemia [ ]   Endocrine: Diabetes [Y ]; Thyroid dysfunction[ ]   Home Medications Prior to Admission medications   Medication Sig Start Date End Date Taking? Authorizing Provider  atorvastatin (LIPITOR) 20 MG tablet Take 20 mg by mouth daily. 11/28/13  Yes Marinus Maw, MD  carvedilol (COREG) 25 MG tablet Take 25 mg by mouth 2 (two) times daily with a meal.   Yes Historical Provider, MD  colchicine 0.6 MG tablet Take 0.6 mg by mouth daily.   Yes Historical Provider, MD  Febuxostat (ULORIC) 80 MG TABS Take 1 tablet by mouth daily.   Yes Historical Provider, MD  furosemide (LASIX) 80 MG tablet Take 80 mg by mouth daily.   Yes Historical Provider, MD  hydrALAZINE (APRESOLINE) 100 MG tablet Take 100 mg by mouth  3 (three) times daily.    Yes Historical Provider, MD  HYDROcodone-acetaminophen (NORCO) 7.5-325 MG per tablet Take 1 tablet by mouth every 4 (four) hours as needed for moderate pain.    Yes Historical Provider, MD  isosorbide mononitrate (IMDUR) 60 MG 24 hr tablet TAKE 2 TABLETS DAILY 01/13/14  Yes Jodelle GrossKathryn M Lawrence, NP  LANOXIN 125 MCG tablet TAKE 1 TABLET DAILY   Yes Jodelle GrossKathryn M Lawrence, NP  metFORMIN (GLUCOPHAGE) 500 MG tablet Take 500 mg by mouth 2 (two) times daily with a meal.   Yes Historical Provider, MD  metoprolol tartrate (LOPRESSOR) 25 MG tablet Take 1 tablet (25 mg total) by mouth 2 (two) times daily. 12/26/13  Yes Jodelle GrossKathryn M Lawrence, NP  Multiple Vitamin (MULTIVITAMIN) tablet Take 0.5 tablets by mouth  daily.    Yes Historical Provider, MD  nitroGLYCERIN (NITROSTAT) 0.4 MG SL tablet Place 0.4 mg under the tongue every 5 (five) minutes as needed for chest pain.    Yes Historical Provider, MD  Omega-3 Fatty Acids (FISH OIL) 1000 MG CAPS Take 1 capsule by mouth daily.   Yes Historical Provider, MD  potassium chloride SA (K-DUR,KLOR-CON) 20 MEQ tablet Take 20-40 mEq by mouth 2 (two) times daily. Take 40 mg by mouth in the morning and 20 mg by mouth in the evening.   Yes Historical Provider, MD  warfarin (COUMADIN) 5 MG tablet Take 5-7.5 mg by mouth daily. Take 7.5 mg by mouth on Thursday.  Take 5 mg by mouth on all other days.   Yes Historical Provider, MD    Past Medical History: Past Medical History  Diagnosis Date  . Nonischemic cardiomyopathy     Initial dx 1992; nl coronary angios  in 1/03; AICD/bivent. pacemaker - 4/09; EF 20% in '92, 15-20% in '02, 20-25% in '08, 40-45% in 6/10.; digoxin toxicity in 2009; ACE Inhibitor discontinued in 2010 during hospital admission  for low output syndrome  . Hypertension     04/2011-normal electrolytes  . Chronic kidney disease, stage II (mild)     creatinine of 1.4 - 3/08, 2.1 - 2/09, 1.55 - 3/10, 1.4 - 7/10; 1.63 in 04/2011  . PSVT (paroxysmal supraventricular tachycardia)     s/p radiofrequency ablation  . Gout     uric acid of 10 6/11  . Anemia     hemoglobin of 11/33 in 11/09; 11/34.6 with MCV of 89 in 10/2010  . Thrombus     left upper extremity; following device insertion  . Chronic anticoagulation     Hemoccult-negative in 10/2010  . Coronary artery disease   . CHF (congestive heart failure)   . Automatic implantable cardioverter-defibrillator in situ   . History of blood transfusion 1959  . Hepatitis 1962    "don't know which kind"  . CVA (cerebral vascular accident) 1992    "memory not always clear since"  . DJD (degenerative joint disease)     knees; right hip  . Arthritis     "knees, back" (04/03/2014)  . Chronic mid back pain      Past Surgical History: Past Surgical History  Procedure Laterality Date  . Abdominal hysterectomy    . Bi-ventricular implantable cardioverter defibrillator  (crt-d)  4/09; 12/13/2013    MDT CRTD implanted by Dr Ladona Ridgelaylor; gen change to MDT CRTD 4-15 by Dr Ladona Ridgelaylor  . Cataract extraction w/ intraocular lens implant Right ~ 2009    Family History: Family History  Problem Relation Age of Onset  . Diabetes Brother   .  Heart attack Sister 48    also has liver problems     Social History: History   Social History  . Marital Status: Married    Spouse Name: N/A    Number of Children: N/A  . Years of Education: N/A   Social History Main Topics  . Smoking status: Never Smoker   . Smokeless tobacco: Never Used  . Alcohol Use: No  . Drug Use: No  . Sexual Activity: No   Other Topics Concern  . None   Social History Narrative   Married, retired, does not get regular exercise, no caffeine.     Allergies:  Allergies  Allergen Reactions  . Tuberculin Tests Swelling    Objective:    Vital Signs:   Temp:  [97.6 F (36.4 C)-98.7 F (37.1 C)] 98.6 F (37 C) (08/05 1350) Pulse Rate:  [70-91] 70 (08/05 1350) Resp:  [18-20] 18 (08/05 1350) BP: (82-115)/(36-101) 86/44 mmHg (08/05 1350) SpO2:  [98 %-100 %] 100 % (08/05 1350) Weight:  [171 lb 8.3 oz (77.8 kg)] 171 lb 8.3 oz (77.8 kg) (08/05 0613) Last BM Date: 04/04/14 Filed Weights   04/03/14 1012 04/04/14 0509 04/05/14 1610  Weight: 176 lb (79.833 kg) 180 lb 5.4 oz (81.8 kg) 171 lb 8.3 oz (77.8 kg)    Physical Exam: General:  Fatigued appearing. No resp difficulty, lying flat in bed HEENT: normal Neck: supple. JVP 10-11 with prominent CV waves . Carotids 2+ bilat; no bruits. No lymphadenopathy or thryomegaly appreciated. Cor: PMI laterally displaced. Regular rate & rhythm. Palpable s3, + systolic murmur  Lungs: clear Abdomen: soft, tender RUQ, nondistended. Lived edge down No bruits or masses. Good bowel  sounds. Extremities: no cyanosis, clubbing, rash, 1+ bilateral edema Neuro: alert & orientedx3, cranial nerves grossly intact. moves all 4 extremities w/o difficulty. Affect pleasant  Telemetry: V pacing 60s  Labs: Basic Metabolic Panel:  Recent Labs Lab 04/03/14 1020 04/04/14 0520 04/05/14 0420  NA 143 146 144  K 4.0 3.4* 3.3*  CL 107 107 106  CO2 19 20 21   GLUCOSE 164* 86 87  BUN 38* 43* 52*  CREATININE 2.07* 2.03* 1.86*  CALCIUM 9.2 9.1 8.7    Liver Function Tests:  Recent Labs Lab 04/03/14 1056 04/04/14 0520  AST 51* 37  ALT 46* 38*  ALKPHOS 122* 105  BILITOT 3.8* 2.1*  PROT 7.2 6.4  ALBUMIN 3.4* 3.1*   No results found for this basename: LIPASE, AMYLASE,  in the last 168 hours No results found for this basename: AMMONIA,  in the last 168 hours  CBC:  Recent Labs Lab 04/03/14 1020 04/04/14 0520 04/05/14 0420  WBC 8.5 7.1 6.4  NEUTROABS 6.0  --   --   HGB 11.7* 11.3* 11.4*  HCT 34.9* 34.1* 34.5*  MCV 86.6 88.8 88.0  PLT 192 170 199    Cardiac Enzymes:  Recent Labs Lab 04/03/14 1020 04/03/14 1850  TROPONINI <0.30 <0.30    BNP: BNP (last 3 results)  Recent Labs  04/03/14 1056  PROBNP 19030.0*    CBG:  Recent Labs Lab 04/04/14 0036 04/04/14 0512 04/04/14 1612 04/04/14 2104 04/05/14 0633  GLUCAP 85 88 90 108* 94    Coagulation Studies:  Recent Labs  04/03/14 1020 04/04/14 0520  LABPROT 28.9* 25.1*  INR 2.73* 2.28*    Other results: EKG: AF with V paced 69 bpm  Imaging: Nm Hepatobiliary  04/04/2014   CLINICAL DATA:  Nausea, vomiting and thickened gallbladder wall by ultrasound.  EXAM:  NUCLEAR MEDICINE HEPATOBILIARY IMAGING  TECHNIQUE: Sequential images of the abdomen were obtained out to 60 minutes following intravenous administration of radiopharmaceutical.  RADIOPHARMACEUTICALS:  5.0 Millicurie Tc-9m Choletec  COMPARISON:  Right upper quadrant ultrasound on 04/03/2014  FINDINGS: There is normal hepatic uptake of  radiopharmaceutical. Normal visualization of the gallbladder at 15 min. There is no evidence of biliary obstruction with activity noted to transit throughout the small bowel.  IMPRESSION: Normal hepatobiliary nuclear medicine scan demonstrating no evidence of cholecystitis or biliary obstruction. Ultrasound findings may have related to relative contraction of the gallbladder at the time of imaging.   Electronically Signed   By: Irish Lack M.D.   On: 04/04/2014 12:42         Assessment:   1) A/C combined systolic/disatolic HF 2) NICM - s/p BiV ICD 3) Afib, chronic 4) Chronic kidney disease, stage IV 5) Hypokalemia 6) Mod TR  Plan/Discussion:    [name] is a pleasant 80 yo female with a history of NICM s/p BiV ICD, chronic combined systolic/diastolic HF, afib, HTN and CKD stage IV. She presented to [facility] with increased SOB, nausea/vomiting, abdominal pain and loss of appetite. There were concerns for cholecystitis and surgery consulted and started on empiric abx. She underwent HIDA scal with no evidence of cholecystitis or bilary obstruction.   I am concerned that she maybe suffering from low-output. She does appear volume overloaded with a palpable s3. Increase lasix to 80 mg IV BID. Renal function stable and will continue to follow. Will cut coreg back to 12.5 mg BID with concerns for low output. She is not on ACE-I/spiro with CKD. SBP soft currently so will hold off on adding hydralazine but will continue Imdur. Will stop digoxin with renal failure. Could consider RHC to assess hemodynamics and if low-output could use milrinone.   With recent palpitations will get device interrogated.   Continue to work with PT.  Continue coumadin, pharmacy dosing.    Length of Stay: 2 Brandi Rud NP-C 04/05/2014, 2:01 PM  Advanced Heart Failure Team Pager [phone] (M-F; 7a - 4p)  Please contact [facility] for night-coverage after hours (4p -7a ) and weekends on  [email or web address]  Patient seen and examined with Brandi Potash, NP. We discussed all aspects of the encounter. I agree with the assessment and plan as stated above.   She has severe NICM and reports she is much worse since CRT changeout. However, this also corresponds to increasing persistence of her AF.  I suspect all her  belly symptoms are related to low output and liver capsular distension. Would continue IV diuresis and will ask [employer] to assess CRT to see if we can optimize programming. That said, I suspect this is just progression of her HF +/- AF. We discussed possible trial of inotropes. She is reluctant but willing to consider. Will plan RHC on Friday with possible milrinone initiation. Will also need to consider if it would be worthwhile to start amio and attempt to restore NSR. Can d/w Dr. Ladona Ridgel.  Would cut carvedilol down to 6.25 bid. Stop digoxin with renal failure. BP too soft for other agents. We will follow closely. May be nearing point where we need to address EOL issues.   Brandi Bensimhon,MD 4:21 PM

## 2014-04-05 NOTE — Progress Notes (Signed)
Pharmacist Heart Failure Core Measure Documentation  Assessment: [name] has an EF documented as 20-25% on 04/04/14 by ECHO.  Rationale: Heart failure patients with left ventricular systolic dysfunction (LVSD) and an EF < 40% should be prescribed an angiotensin converting enzyme inhibitor (ACEI) or angiotensin receptor blocker (ARB) at discharge unless a contraindication is documented in the medical record.  This patient is not currently on an ACEI or ARB for HF.  This note is being placed in the record in order to provide documentation that a contraindication to the use of these agents is present for this encounter.  ACE Inhibitor or Angiotensin Receptor Blocker is contraindicated (specify all that apply)  []   ACEI allergy AND ARB allergy []   Angioedema []   Moderate or severe aortic stenosis []   Hyperkalemia []   Hypotension []   Renal artery stenosis [x]   Worsening renal function, preexisting renal disease or dysfunction  Vinnie Level, PharmD.  Clinical Pharmacist Pager [phone]

## 2014-04-05 NOTE — Progress Notes (Signed)
[facility] PROGRESS NOTE  [name] [name] DOB: 01-13-1933 DOA: 04/03/2014 PCP: Lanette Hampshire, MD Interim Summary Patient is a pleasant 80 year old female with a past medical history of chronic systolic congestive heart failure having an ejection fraction of 20-25%, atrial fibrillation currently on anticoagulation who presented to the emergency room at [facility] on 04/03/2014 complaining of shortness of breath, nausea vomiting and right upper quadrant pain. Initial workup in the emergency room included a chest x-ray that showed cardiomegaly without overt failure, BNP of 19,030, Creatinine of 2.07. She was also found to have an elevated bilirubin of 3.0 and alk phos of 122. RUQ US showing thickened gallbladder. She was transferred from [facility] to [facility] for surgical evaluation. Liver enzymes today trending down. HIDA scan did not show evidence of cholecystitis or biliary obstruction.With regard to CHF she was diureses with Lasix 80 mg IV BID having a urinary output of 2.5L.    Assessment/Plan: 1.Right Upper Quadrant Pain.  -Initially there was concern for a cute cholecystitis with her u/s showing thickened gallbladder wal -She was seen and evaluated by surgery as a HIDA scan did not reveal evidence of cholecystitis.  -Liver enzymes trending down. -Suspect hepatic congestion from Hear failure.   2. Acute on Chronic Combine Systolic and diastolic Congestive Heart Failure -Patient presented with signs symptoms consistent with acute CHF. She had a transthoracic echocardiogram that was performed on 04/05/2013 showing an ejection fraction of 20-25% with diffuse hypokinesis. -Repeat transthoracic echocardiogram EF 15 to 20 %. Grade 3 diastolic dysfunction. -on  Lasix at $Remove'80mg'LmfjOnP$  IV q daily.  -Relates dyspnea on and off. SBP running soft 89 to 100. Will hold hydralazine. Will consult heart failure team. Cycle enzymes.  -Oxygen saturation 98 RA.   3. Stage III  chronic kidney disease -Patient's baseline creatinine near 1.7-1.9 -Monitor on IV lasix.   4. Hypokalemia -replete with 20 meq BID.   5. History of chronic atrial fibrillation -Presently rate controlled with heart rates in the 80s to 90s -discontinue metoprolol  25 mg BID. Continue with carvedilol 25 mg twice a day and digoxin 0.125 mg by mouth daily  6. Anticoagulation -Patient's INR therapeutic at 2.28 -Pharmacy consult for warfarin dosing  Code Status: Full Code Family Communication:  Care discussed with patient.  Disposition Plan: Continue IV diuresis, will likely be discharged home when medically stable.    Consultants:  Surgery  Procedures:  HIDA scan Impression:  Normal hepatobiliary nuclear medicine scan demonstrating no evidence  of cholecystitis or biliary obstruction. Ultrasound findings may  have related to relative contraction of the gallbladder at the time  of imaging   HPI/Subjective: Patient reports SOB, relates dyspnea is on and off. She is speaking full sentences.   Objective: Filed Vitals:   04/05/14 1035  BP: 115/101  Pulse: 85  Temp:   Resp:     Intake/Output Summary (Last 24 hours) at 04/05/14 1212 Last data filed at 04/05/14 1000  Gross per 24 hour  Intake    580 ml  Output   2576 ml  Net  -1996 ml   Filed Weights   04/03/14 1012 04/04/14 0509 04/05/14 0613  Weight: 79.833 kg (176 lb) 81.8 kg (180 lb 5.4 oz) 77.8 kg (171 lb 8.3 oz)    Exam:   General: Awake and alert, following commands, no acute distress  Cardiovascular: Regular rate and rhythm, normal S1S2, no extremity edema, positive JVD  Respiratory:  normal inspiratory effort, bilateral crackles.   Abdomen: Soft,  nondistended, having right upper quadrant pain with palpation, no rebound tenderness  Musculoskeletal: No edema   Data Reviewed: Basic Metabolic Panel:  Recent Labs Lab 04/03/14 1020 04/04/14 0520 04/05/14 0420  NA 143 146 144  K 4.0 3.4* 3.3*  CL  107 107 106  CO2 $Re'19 20 21  'lHx$ GLUCOSE 164* 86 87  BUN 38* 43* 52*  CREATININE 2.07* 2.03* 1.86*  CALCIUM 9.2 9.1 8.7   Liver Function Tests:  Recent Labs Lab 04/03/14 1056 04/04/14 0520  AST 51* 37  ALT 46* 38*  ALKPHOS 122* 105  BILITOT 3.8* 2.1*  PROT 7.2 6.4  ALBUMIN 3.4* 3.1*   No results found for this basename: LIPASE, AMYLASE,  in the last 168 hours No results found for this basename: AMMONIA,  in the last 168 hours CBC:  Recent Labs Lab 04/03/14 1020 04/04/14 0520 04/05/14 0420  WBC 8.5 7.1 6.4  NEUTROABS 6.0  --   --   HGB 11.7* 11.3* 11.4*  HCT 34.9* 34.1* 34.5*  MCV 86.6 88.8 88.0  PLT 192 170 199   Cardiac Enzymes:  Recent Labs Lab 04/03/14 1020 04/03/14 1850  TROPONINI <0.30 <0.30   BNP (last 3 results)  Recent Labs  04/03/14 1056  PROBNP 19030.0*   CBG:  Recent Labs Lab 04/04/14 0036 04/04/14 0512 04/04/14 1612 04/04/14 2104 04/05/14 0633  GLUCAP 85 88 90 108* 94    No results found for this or any previous visit (from the past 240 hour(s)).   Studies: Nm Hepatobiliary  04/04/2014   CLINICAL DATA:  Nausea, vomiting and thickened gallbladder wall by ultrasound.  EXAM: NUCLEAR MEDICINE HEPATOBILIARY IMAGING  TECHNIQUE: Sequential images of the abdomen were obtained out to 60 minutes following intravenous administration of radiopharmaceutical.  RADIOPHARMACEUTICALS:  5.0 Millicurie DG-64Q Choletec  COMPARISON:  Right upper quadrant ultrasound on 04/03/2014  FINDINGS: There is normal hepatic uptake of radiopharmaceutical. Normal visualization of the gallbladder at 15 min. There is no evidence of biliary obstruction with activity noted to transit throughout the small bowel.  IMPRESSION: Normal hepatobiliary nuclear medicine scan demonstrating no evidence of cholecystitis or biliary obstruction. Ultrasound findings may have related to relative contraction of the gallbladder at the time of imaging.   Electronically Signed   By: Aletta Edouard  M.D.   On: 04/04/2014 12:42    Scheduled Meds: . atorvastatin  20 mg Oral Daily  . carvedilol  25 mg Oral BID WC  . colchicine  0.3 mg Oral Daily  . digoxin  0.125 mg Oral Daily  . Febuxostat  1 tablet Oral Daily  . furosemide  80 mg Intravenous Daily  . hydrALAZINE  100 mg Oral TID  . insulin aspart  0-15 Units Subcutaneous TID WC  . insulin aspart  0-5 Units Subcutaneous QHS  . isosorbide mononitrate  60 mg Oral Daily  . metoprolol  2.5 mg Intravenous Once  . potassium chloride SA  20 mEq Oral BID  . sodium chloride  3 mL Intravenous Q12H  . sodium chloride  3 mL Intravenous Q12H  . Warfarin - Pharmacist Dosing Inpatient   Does not apply q1800   Continuous Infusions:   Principal Problem:   Acute on chronic systolic HF (heart failure) Active Problems:   Atrial fibrillation, chronic   Nonischemic cardiomyopathy   Hypertension   Anemia, normocytic normochromic   Chronic anticoagulation   Cholecystitis, acute   Acute renal failure superimposed on stage 3 chronic kidney disease   Acute on chronic systolic heart failure  Dyspnea    Time spent: 35 min    Myrtha Tonkovich, [facility] Pager [phone] If 7PM-7AM, please contact night-coverage at [email or web address], password [facility] 04/05/2014, 12:12 PM  LOS: 2 days

## 2014-04-06 LAB — BASIC METABOLIC PANEL
ANION GAP: 16 — AB (ref 5–15)
BUN: 55 mg/dL — ABNORMAL HIGH (ref 6–23)
CALCIUM: 8.7 mg/dL (ref 8.4–10.5)
CO2: 21 mEq/L (ref 19–32)
CREATININE: 2.08 mg/dL — AB (ref 0.50–1.10)
Chloride: 103 mEq/L (ref 96–112)
GFR calc Af Amer: 25 mL/min — ABNORMAL LOW (ref >90)
GFR calc non Af Amer: 22 mL/min — ABNORMAL LOW (ref >90)
Glucose, Bld: 90 mg/dL (ref 70–99)
Potassium: 3.5 mEq/L — ABNORMAL LOW (ref 3.7–5.3)
SODIUM: 140 meq/L (ref 137–147)

## 2014-04-06 LAB — CBC
HCT: 33.7 % — ABNORMAL LOW (ref 36.0–46.0)
Hemoglobin: 11.2 g/dL — ABNORMAL LOW (ref 12.0–15.0)
MCH: 28.8 pg (ref 26.0–34.0)
MCHC: 33.2 g/dL (ref 30.0–36.0)
MCV: 86.6 fL (ref 78.0–100.0)
Platelets: 220 10*3/uL (ref 150–400)
RBC: 3.89 MIL/uL (ref 3.87–5.11)
RDW: 14.5 % (ref 11.5–15.5)
WBC: 6.1 10*3/uL (ref 4.0–10.5)

## 2014-04-06 LAB — PROTIME-INR
INR: 2.22 — ABNORMAL HIGH (ref 0.00–1.49)
PROTHROMBIN TIME: 24.6 s — AB (ref 11.6–15.2)

## 2014-04-06 LAB — TROPONIN I

## 2014-04-06 MED ORDER — CHLORHEXIDINE GLUCONATE CLOTH 2 % EX PADS: 6.0000 | MEDICATED_PAD | Freq: Once | CUTANEOUS | Status: AC

## 2014-04-06 MED ORDER — CHLORHEXIDINE GLUCONATE CLOTH 2 % EX PADS
6.0000 | MEDICATED_PAD | Freq: Once | CUTANEOUS | Status: AC
  Administered 2014-04-07: 6 via TOPICAL

## 2014-04-06 MED ORDER — POTASSIUM CHLORIDE CRYS ER 10 MEQ PO TBCR
10.0000 meq | EXTENDED_RELEASE_TABLET | Freq: Once | ORAL | Status: AC
  Administered 2014-04-06: 10 meq via ORAL
  Filled 2014-04-06: qty 1

## 2014-04-06 NOTE — Progress Notes (Signed)
[facility] PROGRESS NOTE  [name] OZH:[id number] DOB: [birth year] DOA: 04/03/2014 PCP: Lanette Hampshire, MD Interim Summary Patient is a pleasant 80 year old female with a past medical history of chronic systolic congestive heart failure having an ejection fraction of 20-25%, atrial fibrillation currently on anticoagulation who presented to the emergency room at [facility] on 04/03/2014 complaining of shortness of breath, nausea vomiting and right upper quadrant pain. Initial workup in the emergency room included a chest x-ray that showed cardiomegaly without overt failure, BNP of 19,030, Creatinine of 2.07. She was also found to have an elevated bilirubin of 3.0 and alk phos of 122. RUQ US showing thickened gallbladder. She was transferred from [facility] to [facility] for surgical evaluation. Liver enzymes today trending down. HIDA scan did not show evidence of cholecystitis or biliary obstruction.With regard to CHF she was diureses with Lasix 80 mg IV BID having a urinary output of 2.5L.    Assessment/Plan:  2. Acute on Chronic Combine Systolic and diastolic Congestive Heart Failure -Patient presented with signs symptoms consistent with acute CHF. She had a transthoracic echocardiogram that was performed on 04/05/2013 showing an ejection fraction of 20-25% with diffuse hypokinesis. -Repeat transthoracic echocardiogram EF 15 to 20 %. Grade 3 diastolic dysfunction. -Change to Lasix at $Remove'80mg'kOEkywB$  IV BID.  -Relates dyspnea on and off. SBP running soft 89 to 100. Will hold hydralazine. Will consult heart failure team. Cycle enzymes.  -Oxygen saturation 98 RA.  -Appreciate Heart Failure team help. Considering Right side Cath on Friday.    1.Right Upper Quadrant Pain.  -Initially there was concern for a cute cholecystitis with her u/s showing thickened gallbladder wal -She was seen and evaluated by surgery as a HIDA scan did not reveal evidence of cholecystitis.  -Liver  enzymes trending down. -Suspect hepatic congestion from Hear failure.    3. Stage III chronic kidney disease -Patient's baseline creatinine near 1.7-1.9 -Monitor on IV lasix.  -Cr increasing to 2.0.   4. Hypokalemia -replete with 20 meq BID.   5. History of chronic atrial fibrillation -Presently rate controlled with heart rates in the 80s to 90s -discontinue metoprolol  25 mg BID. -Continue with carvedilol dose decrease to 6.2.  -Digoxin discontinue.   6. Anticoagulation -Patient's INR therapeutic at 2.28 -Pharmacy consult for warfarin dosing  Code Status: Full Code Family Communication:  Care discussed with patient.  Disposition Plan: Continue IV diuresis.    Consultants:  Surgery  Procedures:  HIDA scan Impression:  Normal hepatobiliary nuclear medicine scan demonstrating no evidence  of cholecystitis or biliary obstruction. Ultrasound findings may  have related to relative contraction of the gallbladder at the time  of imaging   HPI/Subjective: Feels she is breathing better today.  She is thinking about cath.   Objective: Filed Vitals:   04/06/14 1353  BP: 90/50  Pulse: 84  Temp: 97.8 F (36.6 C)  Resp: 18    Intake/Output Summary (Last 24 hours) at 04/06/14 1427 Last data filed at 04/06/14 1300  Gross per 24 hour  Intake    840 ml  Output   2000 ml  Net  -1160 ml   Filed Weights   04/04/14 0509 04/05/14 0613 04/06/14 0620  Weight: 81.8 kg (180 lb 5.4 oz) 77.8 kg (171 lb 8.3 oz) 77.474 kg (170 lb 12.8 oz)    Exam:   General: Awake and alert, following commands, no acute distress  Cardiovascular: Regular rate and rhythm, normal S1S2, no extremity edema, positive JVD  Respiratory:  normal inspiratory effort, bilateral crackles.   Abdomen: Soft, nondistended, having right upper quadrant pain with palpation, no rebound tenderness  Musculoskeletal: trace edema.   Data Reviewed: Basic Metabolic Panel:  Recent Labs Lab 04/03/14 1020  04/04/14 0520 04/05/14 0420 04/06/14 0010  NA 143 146 144 140  K 4.0 3.4* 3.3* 3.5*  CL 107 107 106 103  CO2 $Re'19 20 21 21  'dhI$ GLUCOSE 164* 86 87 90  BUN 38* 43* 52* 55*  CREATININE 2.07* 2.03* 1.86* 2.08*  CALCIUM 9.2 9.1 8.7 8.7   Liver Function Tests:  Recent Labs Lab 04/03/14 1056 04/04/14 0520  AST 51* 37  ALT 46* 38*  ALKPHOS 122* 105  BILITOT 3.8* 2.1*  PROT 7.2 6.4  ALBUMIN 3.4* 3.1*   No results found for this basename: LIPASE, AMYLASE,  in the last 168 hours No results found for this basename: AMMONIA,  in the last 168 hours CBC:  Recent Labs Lab 04/03/14 1020 04/04/14 0520 04/05/14 0420 04/06/14 0010  WBC 8.5 7.1 6.4 6.1  NEUTROABS 6.0  --   --   --   HGB 11.7* 11.3* 11.4* 11.2*  HCT 34.9* 34.1* 34.5* 33.7*  MCV 86.6 88.8 88.0 86.6  PLT 192 170 199 220   Cardiac Enzymes:  Recent Labs Lab 04/03/14 1020 04/03/14 1850 04/05/14 1312 04/05/14 1844 04/06/14 0010  TROPONINI <0.30 <0.30 <0.30 <0.30 <0.30   BNP (last 3 results)  Recent Labs  04/03/14 1056 04/05/14 1312  PROBNP 19030.0* 5310.0*   CBG:  Recent Labs Lab 04/04/14 0036 04/04/14 0512 04/04/14 1612 04/04/14 2104 04/05/14 0633  GLUCAP 85 88 90 108* 94    No results found for this or any previous visit (from the past 240 hour(s)).   Studies: No results found.  Scheduled Meds: . atorvastatin  20 mg Oral Daily  . carvedilol  6.25 mg Oral BID WC  . colchicine  0.3 mg Oral Daily  . Febuxostat  1 tablet Oral Daily  . furosemide  80 mg Intravenous BID  . isosorbide mononitrate  60 mg Oral Daily  . metoprolol  2.5 mg Intravenous Once  . potassium chloride SA  20 mEq Oral BID  . sodium chloride  3 mL Intravenous Q12H  . sodium chloride  3 mL Intravenous Q12H  . warfarin  5 mg Oral ONCE-1800  . Warfarin - Pharmacist Dosing Inpatient   Does not apply q1800   Continuous Infusions:   Principal Problem:   Acute on chronic systolic HF (heart failure) Active Problems:   Atrial  fibrillation, chronic   Nonischemic cardiomyopathy   Hypertension   Anemia, normocytic normochromic   Chronic anticoagulation   Cholecystitis, acute   Acute renal failure superimposed on stage 3 chronic kidney disease   Acute on chronic systolic heart failure   Dyspnea    Time spent: 25 min    Shania Bjelland A  [facility] Pager [phone] If 7PM-7AM, please contact night-coverage at [email or web address], password [facility] 04/06/2014, 2:27 PM  LOS: 3 days

## 2014-04-06 NOTE — Progress Notes (Signed)
Advanced Heart Failure Rounding Note   Subjective:    [name] is a 80 yo female with a history of NICM (EF 15-20%) s/p BiV ICD, HTN, chronic systolic HF, afib s/p AV node ablation, CVA, DM2 and CKD stage IV.   She presented to [facility] ED with 2d history of persistent nausea and vomiting, abdominal pain, loss of appetitie and worsening SOB. Pertinent labs in the ED were creatinine 2.7, alkaline phosphatase 122, AST 51, ALT, 46 total bili 3.8, proBNP 19,030 and lactic acid 1.7. Chest x-ray Cardiomegaly. Small right pleural effusion. Concern for cholecystitis d/t thickened edematous gallbladder wall without definitive gallstones. Surgery consulted and started on empiric abx. She was started on IV diuretics and admitted to the hospital.   Reports since April when she got her BiV that she has not done that well. She is only able to walk about 100 ft before having to stop to get her breath. She is active at home and able to perform all ADLs and cook but has to take breaks. She reports CP that lasts a couple of seconds that comes and goes and happens at least once a week. Does not weigh daily and reports that weight at home has actually been trending down.   ECHO: EF 15-20%, global HK, grade III DD, severe MR, RA mod dilated, mod TR, RV fx nl  ICD interrogated: BiV pacing only about 2/3 time, some NSR but more afib. Weight down 1 lb and 24 hr I/O -1.2 liters.    Objective:   Weight Range:  Vital Signs:   Temp:  [97.5 F (36.4 C)-98.6 F (37 C)] 97.5 F (36.4 C) (08/06 0620) Pulse Rate:  [66-89] 66 (08/06 0900) Resp:  [18-20] 18 (08/06 0900) BP: (86-99)/(44-63) 99/56 mmHg (08/06 1137) SpO2:  [100 %] 100 % (08/06 0900) Weight:  [170 lb 12.8 oz (77.474 kg)] 170 lb 12.8 oz (77.474 kg) (08/06 0620) Last BM Date: 04/04/14  Weight change: Filed Weights   04/04/14 0509 04/05/14 0613 04/06/14 0620  Weight: 180 lb 5.4 oz (81.8 kg) 171 lb 8.3 oz (77.8 kg) 170 lb 12.8 oz (77.474 kg)     Intake/Output:   Intake/Output Summary (Last 24 hours) at 04/06/14 1258 Last data filed at 04/06/14 1243  Gross per 24 hour  Intake    840 ml  Output   2175 ml  Net  -1335 ml     Physical Exam: General: Fatigued appearing. No resp difficulty, lying flat in bed  HEENT: normal  Neck: supple. JVP 10-11 with prominent CV waves . Carotids 2+ bilat; no bruits. No lymphadenopathy or thryomegaly appreciated.  Cor: PMI laterally displaced. Regular rate & rhythm. Palpable s3, + systolic murmur  Lungs: clear  Abdomen: soft, tender RUQ, nondistended. Lived edge down No bruits or masses. Good bowel sounds.  Extremities: no cyanosis, clubbing, rash, 1+ bilateral edema  Neuro: alert & orientedx3, cranial nerves grossly intact. moves all 4 extremities w/o difficulty. Affect pleasant  Telemetry: V pacing 70s  Labs: Basic Metabolic Panel:  Recent Labs Lab 04/03/14 1020 04/04/14 0520 04/05/14 0420 04/06/14 0010  NA 143 146 144 140  K 4.0 3.4* 3.3* 3.5*  CL 107 107 106 103  CO2 19 20 21 21   GLUCOSE 164* 86 87 90  BUN 38* 43* 52* 55*  CREATININE 2.07* 2.03* 1.86* 2.08*  CALCIUM 9.2 9.1 8.7 8.7    Liver Function Tests:  Recent Labs Lab 04/03/14 1056 04/04/14 0520  AST 51* 37  ALT 46* 38*  ALKPHOS 122* 105  BILITOT 3.8* 2.1*  PROT 7.2 6.4  ALBUMIN 3.4* 3.1*   No results found for this basename: LIPASE, AMYLASE,  in the last 168 hours No results found for this basename: AMMONIA,  in the last 168 hours  CBC:  Recent Labs Lab 04/03/14 1020 04/04/14 0520 04/05/14 0420 04/06/14 0010  WBC 8.5 7.1 6.4 6.1  NEUTROABS 6.0  --   --   --   HGB 11.7* 11.3* 11.4* 11.2*  HCT 34.9* 34.1* 34.5* 33.7*  MCV 86.6 88.8 88.0 86.6  PLT 192 170 199 220    Cardiac Enzymes:  Recent Labs Lab 04/03/14 1020 04/03/14 1850 04/05/14 1312 04/05/14 1844 04/06/14 0010  TROPONINI <0.30 <0.30 <0.30 <0.30 <0.30    BNP: BNP (last 3 results)  Recent Labs  04/03/14 1056  04/05/14 1312  PROBNP 19030.0* 5310.0*     Other results:    Imaging:  No results found.   Medications:     Scheduled Medications: . atorvastatin  20 mg Oral Daily  . carvedilol  6.25 mg Oral BID WC  . colchicine  0.3 mg Oral Daily  . Febuxostat  1 tablet Oral Daily  . furosemide  80 mg Intravenous BID  . isosorbide mononitrate  60 mg Oral Daily  . metoprolol  2.5 mg Intravenous Once  . potassium chloride SA  20 mEq Oral BID  . sodium chloride  3 mL Intravenous Q12H  . sodium chloride  3 mL Intravenous Q12H  . warfarin  5 mg Oral ONCE-1800  . Warfarin - Pharmacist Dosing Inpatient   Does not apply q1800     Infusions:     PRN Medications:  sodium chloride, acetaminophen, acetaminophen, bisacodyl, HYDROcodone-acetaminophen, nitroGLYCERIN, ondansetron (ZOFRAN) IV, ondansetron, sodium chloride   Assessment:   1) A/C combined systolic/disatolic HF  2) NICM  - s/p BiV ICD  3) Afib, chronic  - s/p AV node ablation 4) Chronic kidney disease, stage IV  5) Hypokalemia  6) Mod TR   Plan/Discussion:     Length of Stay: 3 Aundria RudCosgrove, Ali B 04/06/2014, 12:58 PM  Advanced Heart Failure Team Pager [phone] (M-F; 7a - 4p)  Please contact [facility] for night-coverage after hours (4p -7a ) and weekends on [email or web address]  Patient seen and examined with Ulla PotashAli Cosgrove, NP. We discussed all aspects of the encounter. I agree with the assessment and plan as stated above.   She is feeling some better with diuresis but still with very prominent S3 and renal function getting worse. I suspect she is low output. ICD interrogation reviewed. She has mostly AF. Apparently she had AVN ablation in 2010 (though I can find no record of this in Epic anywhere including notes from 2010). There was a mention of CHB. On ICD interrogation she conducts through the AVN about 1/3 of the time and this has interfered with BiV pacing. I discussed the options with Dr. Ladona Ridgelaylor including attempting to  restore NSR with amio support vs AVN ablation. He feels AVN ablation would be the quickest way to restore BiV pacing and may help her. We will plan for this tomorrow. We will also plan for RHC. If low output may benefit from milrinone as well. We will then revisit attempting to restore NSR. Hold coumadin tonight. Would hold lasix for now as volume not that high on Optivol.   Truman Haywardaniel Corbitt Cloke,MD 3:47 PM

## 2014-04-06 NOTE — Progress Notes (Signed)
Physical Therapy Treatment Patient Details Name: [name] MRN: [id number] DOB: 07-30-1933 Today's Date: 04/25/2014    History of Present Illness Pt is a 80 y/o female admitted s/p complaints of nausea, vomiting, abdominal pain. Ultrasound of right upper quadrant indicates thickened gallbladder. Pt was admitted for surgical evaluation for cholecystitis.    PT Comments    Session limited by hypotension today. Per RN, pt appropriate for bed activity only, and session focused on supine therapeutic exercise. Will continue to follow and progress as able per POC.   Follow Up Recommendations  Home health PT;Supervision for mobility/OOB     Equipment Recommendations  None recommended by PT    Recommendations for Other Services       Precautions / Restrictions Precautions Precautions: Fall Restrictions Weight Bearing Restrictions: No    Mobility  Bed Mobility Overal bed mobility: Needs Assistance Bed Mobility: Rolling Rolling: Min assist         General bed mobility comments: Min assist for rolling to adjust bed pads under her, as well as to scoot to [facility]. VC's for hand plaement and technique.   Transfers                    Ambulation/Gait                 Stairs            Wheelchair Mobility    Modified Rankin (Stroke Patients Only)       Balance                                    Cognition Arousal/Alertness: Awake/alert Behavior During Therapy: WFL for tasks assessed/performed Overall Cognitive Status: Within Functional Limits for tasks assessed                      Exercises General Exercises - Lower Extremity Ankle Circles/Pumps: 20 reps Quad Sets: 15 reps Short Arc Quad: 15 reps Heel Slides: 15 reps Hip ABduction/ADduction: 15 reps Straight Leg Raises: 10 reps    General Comments        Pertinent Vitals/Pain Pain Assessment: No/denies pain    Home Living                      Prior  Function            PT Goals (current goals can now be found in the care plan section) Acute Rehab PT Goals Patient Stated Goal: To return to her home independently PT Goal Formulation: With patient/family Time For Goal Achievement: 04/11/14 Potential to Achieve Goals: Good Progress towards PT goals: Progressing toward goals    Frequency  Min 3X/week    PT Plan Current plan remains appropriate    Co-evaluation             End of Session   Activity Tolerance: Patient tolerated treatment well Patient left: in bed;with call bell/phone within reach     Time: 1143-1204 PT Time Calculation (min): 21 min  Charges:  $Therapeutic Exercise: 8-22 mins                    G Codes:      Ruthann Cancer 2014/04/25, 5:42 PM  Ruthann Cancer, PT, DPT Acute Rehabilitation Services Pager: [phone]

## 2014-04-06 NOTE — Consult Note (Signed)
ELECTROPHYSIOLOGY CONSULT NOTE    Patient ID: Brandi HackMargaret L Schlack MRN: [id number], DOB/AGE: 78/04/1933 78 y.o.  Admit date: 04/03/2014 Date of Consult: 04-07-2014  Primary Physician: Alice ReichertMCINNIS,ANGUS G, MD Primary Cardiologist: Ladona Ridgelaylor  Reason for Consultation: atrial fibrillation/heart failure  HPI:  [name] is a 80 y.o. female with past medical history significant for non ischemic cardiomyopathy (s/p CRTD implantation), hypertension, permanent atrial fibrillation (s/p AVN ablation 2010), CKD and heart failure.  She was admitted on 04-03-14 with a  2 day history of persistent nausea and vomiting, abdominal pain, loss of appetite and worsening shortness of breath.  She was found to have cholecystitis - surgery was consulted and she was starting on empiric antibiotics.  [facility] and management.  She has been evaluated by heart failure this admission with concern for low-output.  RHC is scheduled for tomorrow.  CRTD interrogation demonstrates only true BiV pacing 68% of the time due to atrial fibrillation with return of intrinsic conduction. EP has been asked to consult for treatment options.   Echo 04-04-14 demonstrated EF 15-20%, grad 3 diastolic dysfunction, severe MR, biatrial enlargement, moderate TR, LA 40.    She states that her shortness of breath is improved.  She has not had recent chest pain.  She denies palpitations, dizziness, fevers, chills, or frank syncope.  ROS is otherwise negative except as outlined above.   Past Medical History  Diagnosis Date  . Nonischemic cardiomyopathy     Initial dx 1992; nl coronary angios  in 1/03; AICD/bivent. pacemaker - 4/09; EF 20% in '92, 15-20% in '02, 20-25% in '08, 40-45% in 6/10.; digoxin toxicity in 2009; ACE Inhibitor discontinued in 2010 during hospital admission  for low output syndrome  . Hypertension     04/2011-normal electrolytes  . Chronic kidney disease, stage II (mild)     creatinine  of 1.4 - 3/08, 2.1 - 2/09, 1.55 - 3/10, 1.4 - 7/10; 1.63 in 04/2011  . PSVT (paroxysmal supraventricular tachycardia)     s/p radiofrequency ablation  . Gout     uric acid of 10 6/11  . Anemia     hemoglobin of 11/33 in 11/09; 11/34.6 with MCV of 89 in 10/2010  . Thrombus     left upper extremity; following device insertion  . Chronic anticoagulation     Hemoccult-negative in 10/2010  . Coronary artery disease   . CHF (congestive heart failure)   . Automatic implantable cardioverter-defibrillator in situ   . History of blood transfusion 1959  . Hepatitis 1962    "don't know which kind"  . CVA (cerebral vascular accident) 1992    "memory not always clear since"  . DJD (degenerative joint disease)     knees; right hip  . Arthritis     "knees, back" (04/03/2014)  . Chronic mid back pain      Surgical History:  Past Surgical History  Procedure Laterality Date  . Abdominal hysterectomy    . Bi-ventricular implantable cardioverter defibrillator  (crt-d)  4/09; 12/13/2013    MDT CRTD implanted by Dr Ladona Ridgelaylor; gen change to MDT CRTD 4-15 by Dr Ladona Ridgelaylor  . Cataract extraction w/ intraocular lens implant Right ~ 2009     Prescriptions prior to admission  Medication Sig Dispense Refill  . atorvastatin (LIPITOR) 20 MG tablet Take 20 mg by mouth daily.      . carvedilol (COREG) 25 MG tablet Take 25 mg by mouth 2 (two) times daily with a meal.      .  colchicine 0.6 MG tablet Take 0.6 mg by mouth daily.      . Febuxostat (ULORIC) 80 MG TABS Take 1 tablet by mouth daily.      . furosemide (LASIX) 80 MG tablet Take 80 mg by mouth daily.      . hydrALAZINE (APRESOLINE) 100 MG tablet Take 100 mg by mouth 3 (three) times daily.       Marland Kitchen HYDROcodone-acetaminophen (NORCO) 7.5-325 MG per tablet Take 1 tablet by mouth every 4 (four) hours as needed for moderate pain.       . isosorbide mononitrate (IMDUR) 60 MG 24 hr tablet TAKE 2 TABLETS DAILY  180 tablet  2  . LANOXIN 125 MCG tablet TAKE 1 TABLET DAILY   90 tablet  1  . metFORMIN (GLUCOPHAGE) 500 MG tablet Take 500 mg by mouth 2 (two) times daily with a meal.      . metoprolol tartrate (LOPRESSOR) 25 MG tablet Take 1 tablet (25 mg total) by mouth 2 (two) times daily.  60 tablet  3  . Multiple Vitamin (MULTIVITAMIN) tablet Take 0.5 tablets by mouth daily.       . nitroGLYCERIN (NITROSTAT) 0.4 MG SL tablet Place 0.4 mg under the tongue every 5 (five) minutes as needed for chest pain.       . Omega-3 Fatty Acids (FISH OIL) 1000 MG CAPS Take 1 capsule by mouth daily.      . potassium chloride SA (K-DUR,KLOR-CON) 20 MEQ tablet Take 20-40 mEq by mouth 2 (two) times daily. Take 40 mg by mouth in the morning and 20 mg by mouth in the evening.      . warfarin (COUMADIN) 5 MG tablet Take 5-7.5 mg by mouth daily. Take 7.5 mg by mouth on Thursday.  Take 5 mg by mouth on all other days.        Inpatient Medications:  . atorvastatin  20 mg Oral Daily  . carvedilol  6.25 mg Oral BID WC  . colchicine  0.3 mg Oral Daily  . Febuxostat  1 tablet Oral Daily  . furosemide  80 mg Intravenous BID  . isosorbide mononitrate  60 mg Oral Daily  . metoprolol  2.5 mg Intravenous Once  . potassium chloride SA  20 mEq Oral BID  . sodium chloride  3 mL Intravenous Q12H  . sodium chloride  3 mL Intravenous Q12H  . warfarin  5 mg Oral ONCE-1800  . Warfarin - Pharmacist Dosing Inpatient   Does not apply q1800    Allergies:  Allergies  Allergen Reactions  . Tuberculin Tests Swelling    History   Social History  . Marital Status: Married    Spouse Name: N/A    Number of Children: N/A  . Years of Education: N/A   Occupational History  . Not on file.   Social History Main Topics  . Smoking status: Never Smoker   . Smokeless tobacco: Never Used  . Alcohol Use: No  . Drug Use: No  . Sexual Activity: No   Other Topics Concern  . Not on file   Social History Narrative   Married, retired, does not get regular exercise, no caffeine.      Family History    Problem Relation Age of Onset  . Diabetes Brother   . Heart attack Sister 62    also has liver problems     BP 99/56  Pulse 66  Temp(Src) 97.5 F (36.4 C) (Oral)  Resp 18  Ht 5\' 9"  (1.753  m)  Wt 170 lb 12.8 oz (77.474 kg)  BMI 25.21 kg/m2  SpO2 100%  Physical Exam: stable appearing 80 yo woman, NAD HEENT: Unremarkable,[place], AT Neck:  8 JVD, no thyromegally Back:  No CVA tenderness Lungs:  Clear with no wheezes, rales, or rhonchi HEART:  Regular rate rhythm, no murmurs, no rubs, no clicks Abd:  soft, positive bowel sounds, no organomegally, no rebound, no guarding Ext:  2 plus pulses, no edema, no cyanosis, no clubbing Skin:  No rashes no nodules Neuro:  CN II through XII intact, motor grossly intact   Labs:   Lab Results  Component Value Date   WBC 6.1 04/06/2014   HGB 11.2* 04/06/2014   HCT 33.7* 04/06/2014   MCV 86.6 04/06/2014   PLT 220 04/06/2014    Recent Labs Lab 04/04/14 0520  04/06/14 0010  NA 146  < > 140  K 3.4*  < > 3.5*  CL 107  < > 103  CO2 20  < > 21  BUN 43*  < > 55*  CREATININE 2.03*  < > 2.08*  CALCIUM 9.1  < > 8.7  PROT 6.4  --   --   BILITOT 2.1*  --   --   ALKPHOS 105  --   --   ALT 38*  --   --   AST 37  --   --   GLUCOSE 86  < > 90  < > = values in this interval not displayed.  Radiology/Studies: Dg Chest 2 View 04/03/2014   CLINICAL DATA:  Shortness of breath, weakness.  EXAM: CHEST  2 VIEW  COMPARISON:  10/03/2011  FINDINGS: Left AICD remains in place, unchanged. Cardiomegaly. No overt edema. Small right pleural effusion. No confluent opacity. No acute bony abnormality.  IMPRESSION: Cardiomegaly.  Small right pleural effusion.  No overt failure.   Electronically Signed   By: Charlett Nose M.D.   On: 04/03/2014 10:51   ZOX:[id number] fibrillation with ventricular pacing, rate 69  TELEMETRY: atrial fibrillation with intermittent ventricular pacing  DEVICE HISTORY: MDT CRTD implanted 2009 with generator change 11-2013.  Normal device function, no  treated ventricular arrhythmias.    A/P 1. Acute on chronic systolic heart failure 2. Persistent atrial fibrillation 3. 68% BiV pacing after return of AV node conduction following ablation in 2010.  Rec: In the short term, repeating her AV node ablation will allow for BiV pacing. She will feel best in NSR but her persistent atrial fib will not resolve unless she has been treated with anti-arrhythmic meds. Amiodarone will be the best option but she will need a load for a month or so before repeating DCCV.   Leonia Reeves.D.

## 2014-04-06 NOTE — Progress Notes (Signed)
ANTICOAGULATION CONSULT NOTE - Follow-up Consult  Pharmacy Consult for coumadin Indication: atrial fibrillation  Allergies  Allergen Reactions  . Tuberculin Tests Swelling    Patient Measurements: Height: 5\' 9"  (175.3 cm) Weight: 170 lb 12.8 oz (77.474 kg) (c scale) IBW/kg (Calculated) : 66.2   Vital Signs: Temp: 97.8 F (36.6 C) (08/06 1353) Temp src: Oral (08/06 1353) BP: 90/50 mmHg (08/06 1353) Pulse Rate: 84 (08/06 1353)  Labs:  Recent Labs  04/04/14 0520 04/05/14 0420 04/05/14 1312 04/05/14 1844 04/06/14 0010  HGB 11.3* 11.4*  --   --  11.2*  HCT 34.1* 34.5*  --   --  33.7*  PLT 170 199  --   --  220  LABPROT 25.1*  --   --   --  24.6*  INR 2.28*  --   --   --  2.22*  CREATININE 2.03* 1.86*  --   --  2.08*  TROPONINI  --   --  <0.30 <0.30 <0.30    Estimated Creatinine Clearance: 23.3 ml/min (by C-G formula based on Cr of 2.08).   Assessment: 80 yo female transferred from [facility] 8/3 for surgical evaluation for possible cholecystitis. Coumadin was on hold in case surgery needed. HIDA scan negative on 8/5. INR down to 2.28 on admit, now remains therapeutic at 2.22 after dose charted as not given yesterday evening. H/H is slightly low but stable, plt wnl with no reported s/s bleeding.  Home dose: 5mg  daily except for 7.5mg  on Thur. Last dose 7/31.  Goal of Therapy:  Monitor platelets by anticoagulation protocol: Yes   Plan:  - Hold Coumadin this evening for planned RHC tomorrow - Continue daily INR - Monitor CBC, s/s bleeding - F/u restart of Coumadin after RHC  Margie Billet, PharmD Clinical Pharmacist - Resident Pager: [phone] Pharmacy: [phone] 04/06/2014 3:15 PM

## 2014-04-07 ENCOUNTER — Encounter (HOSPITAL_COMMUNITY)
Admission: EM | Disposition: A | Discharge status: Home Health | Payer: Self-pay | Source: Home / Self Care | Attending: Internal Medicine

## 2014-04-07 DIAGNOSIS — I4891 Unspecified atrial fibrillation: Secondary | ICD-10-CM

## 2014-04-07 DIAGNOSIS — I509 Heart failure, unspecified: Secondary | ICD-10-CM

## 2014-04-07 LAB — CBC
HCT: 35.2 % — ABNORMAL LOW (ref 36.0–46.0)
Hemoglobin: 11.3 g/dL — ABNORMAL LOW (ref 12.0–15.0)
MCH: 28.8 pg (ref 26.0–34.0)
MCHC: 32.1 g/dL (ref 30.0–36.0)
MCV: 89.8 fL (ref 78.0–100.0)
PLATELETS: 230 10*3/uL (ref 150–400)
RBC: 3.92 MIL/uL (ref 3.87–5.11)
RDW: 14.8 % (ref 11.5–15.5)
WBC: 5.4 10*3/uL (ref 4.0–10.5)

## 2014-04-07 LAB — POCT ACTIVATED CLOTTING TIME
ACTIVATED CLOTTING TIME: 174 s
Activated Clotting Time: 236 seconds
Activated Clotting Time: 185 seconds
Activated Clotting Time: 197 seconds

## 2014-04-07 LAB — BASIC METABOLIC PANEL
ANION GAP: 14 (ref 5–15)
Anion gap: 14 (ref 5–15)
BUN: 50 mg/dL — AB (ref 6–23)
BUN: 43 mg/dL — AB (ref 6–23)
CALCIUM: 8.7 mg/dL (ref 8.4–10.5)
CO2: 24 mEq/L (ref 19–32)
CO2: 26 mEq/L (ref 19–32)
Calcium: 9.1 mg/dL (ref 8.4–10.5)
Chloride: 103 mEq/L (ref 96–112)
Chloride: 102 mEq/L (ref 96–112)
Creatinine, Ser: 1.84 mg/dL — ABNORMAL HIGH (ref 0.50–1.10)
Creatinine, Ser: 1.71 mg/dL — ABNORMAL HIGH (ref 0.50–1.10)
GFR calc Af Amer: 29 mL/min — ABNORMAL LOW (ref >90)
GFR calc non Af Amer: 27 mL/min — ABNORMAL LOW (ref >90)
GFR, EST AFRICAN AMERICAN: 32 mL/min — AB (ref >90)
GFR, EST NON AFRICAN AMERICAN: 25 mL/min — AB (ref >90)
GLUCOSE: 86 mg/dL (ref 70–99)
Glucose, Bld: 111 mg/dL — ABNORMAL HIGH (ref 70–99)
POTASSIUM: 4.2 meq/L (ref 3.7–5.3)
Potassium: 3.5 mEq/L — ABNORMAL LOW (ref 3.7–5.3)
Sodium: 141 mEq/L (ref 137–147)
Sodium: 142 mEq/L (ref 137–147)

## 2014-04-07 LAB — MAGNESIUM
MAGNESIUM: 2.1 mg/dL (ref 1.5–2.5)
Magnesium: 2.1 mg/dL (ref 1.5–2.5)

## 2014-04-07 LAB — POCT I-STAT 3, VENOUS BLOOD GAS (G3P V)
Acid-Base Excess: 4 mmol/L — ABNORMAL HIGH (ref 0.0–2.0)
BICARBONATE: 28.4 meq/L — AB (ref 20.0–24.0)
Bicarbonate: 25.2 mEq/L — ABNORMAL HIGH (ref 20.0–24.0)
O2 Saturation: 50 %
O2 Saturation: 52 %
TCO2: 30 mmol/L (ref 0–100)
TCO2: 26 mmol/L (ref 0–100)
pCO2, Ven: 40.1 mmHg — ABNORMAL LOW (ref 45.0–50.0)
pCO2, Ven: 42.4 mmHg — ABNORMAL LOW (ref 45.0–50.0)
pH, Ven: 7.406 — ABNORMAL HIGH (ref 7.250–7.300)
pH, Ven: 7.434 — ABNORMAL HIGH (ref 7.250–7.300)
pO2, Ven: 26 mmHg — CL (ref 30.0–45.0)
pO2, Ven: 28 mmHg — CL (ref 30.0–45.0)

## 2014-04-07 LAB — PROTIME-INR
INR: 1.91 — ABNORMAL HIGH (ref 0.00–1.49)
Prothrombin Time: 21.9 seconds — ABNORMAL HIGH (ref 11.6–15.2)

## 2014-04-07 LAB — GLUCOSE, CAPILLARY: GLUCOSE-CAPILLARY: 96 mg/dL (ref 70–99)

## 2014-04-07 SURGERY — AV NODE ABLATION
Anesthesia: LOCAL

## 2014-04-07 SURGERY — RIGHT HEART CATH
Anesthesia: LOCAL

## 2014-04-07 MED ORDER — LIDOCAINE HCL (PF) 1 % IJ SOLN
INTRAMUSCULAR | Status: AC
  Filled 2014-04-07: qty 30

## 2014-04-07 MED ORDER — SODIUM CHLORIDE 0.9 % IJ SOLN: 3.0000 mL | INTRAMUSCULAR | Status: DC | PRN

## 2014-04-07 MED ORDER — FENTANYL CITRATE 0.05 MG/ML IJ SOLN
INTRAMUSCULAR | Status: AC
  Filled 2014-04-07: qty 2

## 2014-04-07 MED ORDER — SODIUM CHLORIDE 0.9 % IV SOLN: 250.0000 mL | INTRAVENOUS | Status: DC | PRN

## 2014-04-07 MED ORDER — SODIUM CHLORIDE 0.9 % IV SOLN
250.0000 mL | INTRAVENOUS | Status: DC | PRN
  Administered 2014-04-20: 250 mL via INTRAVENOUS

## 2014-04-07 MED ORDER — MIDAZOLAM HCL 2 MG/2ML IJ SOLN
INTRAMUSCULAR | Status: AC
  Filled 2014-04-07: qty 2

## 2014-04-07 MED ORDER — SODIUM CHLORIDE 0.9 % IJ SOLN: 3.0000 mL | Freq: Two times a day (BID) | INTRAMUSCULAR | Status: DC

## 2014-04-07 MED ORDER — WARFARIN SODIUM 7.5 MG PO TABS
7.5000 mg | ORAL_TABLET | Freq: Once | ORAL | Status: AC
  Administered 2014-04-07: 7.5 mg via ORAL
  Filled 2014-04-07: qty 1

## 2014-04-07 MED ORDER — ONDANSETRON HCL 4 MG/2ML IJ SOLN
4.0000 mg | Freq: Four times a day (QID) | INTRAMUSCULAR | Status: DC | PRN
  Administered 2014-04-13: 4 mg via INTRAVENOUS
  Filled 2014-04-07: qty 2

## 2014-04-07 MED ORDER — BUPIVACAINE HCL (PF) 0.25 % IJ SOLN
INTRAMUSCULAR | Status: AC
  Filled 2014-04-07: qty 30

## 2014-04-07 MED ORDER — ACETAMINOPHEN 325 MG PO TABS: 650.0000 mg | ORAL_TABLET | ORAL | Status: DC | PRN

## 2014-04-07 MED ORDER — SODIUM CHLORIDE 0.9 % IJ SOLN
3.0000 mL | Freq: Two times a day (BID) | INTRAMUSCULAR | Status: DC
  Administered 2014-04-07 – 2014-04-19 (×19): 3 mL via INTRAVENOUS

## 2014-04-07 MED ORDER — ONDANSETRON HCL 4 MG/2ML IJ SOLN: 4.0000 mg | Freq: Four times a day (QID) | INTRAMUSCULAR | Status: DC | PRN

## 2014-04-07 MED ORDER — HEPARIN (PORCINE) IN NACL 2-0.9 UNIT/ML-% IJ SOLN
INTRAMUSCULAR | Status: AC
  Filled 2014-04-07: qty 500

## 2014-04-07 MED ORDER — MIDAZOLAM HCL 5 MG/5ML IJ SOLN
INTRAMUSCULAR | Status: AC
  Filled 2014-04-07: qty 5

## 2014-04-07 MED ORDER — SODIUM CHLORIDE 0.9 % IJ SOLN
3.0000 mL | Freq: Two times a day (BID) | INTRAMUSCULAR | Status: DC
  Administered 2014-04-08 – 2014-04-19 (×8): 3 mL via INTRAVENOUS

## 2014-04-07 MED ORDER — SODIUM CHLORIDE 0.9 % IV SOLN: INTRAVENOUS | Status: DC

## 2014-04-07 MED ORDER — HEPARIN (PORCINE) IN NACL 2-0.9 UNIT/ML-% IJ SOLN
INTRAMUSCULAR | Status: AC
Start: 2014-04-07 — End: 2014-04-07
  Filled 2014-04-07: qty 500

## 2014-04-07 MED ORDER — HEPARIN SODIUM (PORCINE) 1000 UNIT/ML IJ SOLN
INTRAMUSCULAR | Status: AC
  Filled 2014-04-07: qty 1

## 2014-04-07 NOTE — Progress Notes (Signed)
ANTICOAGULATION CONSULT NOTE - Follow-up Consult  Pharmacy Consult for coumadin Indication: atrial fibrillation  Allergies  Allergen Reactions  . Tuberculin Tests Swelling    Patient Measurements: Height: 5\' 9"  (175.3 cm) Weight: 171 lb 1.2 oz (77.6 kg) IBW/kg (Calculated) : 66.2   Vital Signs: Temp: 98 F (36.7 C) (08/07 0932) Temp src: Oral (08/07 0932) BP: 115/63 mmHg (08/07 0932) Pulse Rate: 81 (08/07 0932)  Labs:  Recent Labs  04/05/14 0420 04/05/14 1312 04/05/14 1844 04/06/14 0010 04/07/14 0429  HGB 11.4*  --   --  11.2* 11.3*  HCT 34.5*  --   --  33.7* 35.2*  PLT 199  --   --  220 230  LABPROT  --   --   --  24.6* 21.9*  INR  --   --   --  2.22* 1.91*  CREATININE 1.86*  --   --  2.08* 1.84*  TROPONINI  --  <0.30 <0.30 <0.30  --     Estimated Creatinine Clearance: 26.3 ml/min (by C-G formula based on Cr of 1.84).   Assessment: 80 yo female transferred from [facility] 8/3 for surgical evaluation for possible cholecystitis. Coumadin was on hold in case surgery needed. HIDA scan negative on 8/5. INR down to 2.28 on admit, now slightly SUBtherapeutic at 1.91 after missed dose on 8/5 and held dose yesterday for RHC today. H/H remains slightly low but stable, plt wnl with no reported s/s bleeding.  Home dose: 5mg  daily except for 7.5mg  on Thur. Last home dose 7/31.  Goal of Therapy:  Monitor platelets by anticoagulation protocol: Yes   Plan:  - Give increased dose of Coumadin 7.5 mg PO x 1 tonight - Continue daily INR - Monitor CBC, s/s bleeding  Margie Billet, PharmD Clinical Pharmacist - Resident Pager: [phone] Pharmacy: [phone] 04/07/2014 11:53 AM

## 2014-04-07 NOTE — Progress Notes (Signed)
Pt. With run of V-Tach.  Pt. Resting in bed and asymptomatic. No s/s of distress noted. On call NP, K. Schorr, notified via text page. RN will continue to monitor pt. For changes in condition. Pamela Intrieri, Cheryll Dessert

## 2014-04-07 NOTE — Progress Notes (Signed)
[facility] PROGRESS NOTE  [name] KVQ:[id number] DOB: December 06, 1932 DOA: 04/03/2014 PCP: Brandi Hampshire, MD Interim Summary Patient is a pleasant 80 year old female with a past medical history of chronic systolic congestive heart failure having an ejection fraction of 20-25%, atrial fibrillation currently on anticoagulation who presented to the emergency room at [facility] on 04/03/2014 complaining of shortness of breath, nausea vomiting and right upper quadrant pain. Initial workup in the emergency room included a chest x-ray that showed cardiomegaly without overt failure, BNP of 19,030, Creatinine of 2.07. She was also found to have an elevated bilirubin of 3.0 and alk phos of 122. RUQ US showing thickened gallbladder. She was transferred from [facility] to [facility] for surgical evaluation. Liver enzymes today trending down. HIDA scan did not show evidence of cholecystitis or biliary obstruction.With regard to CHF she was diureses with Lasix 80 mg IV BID having a urinary output of 2.5L.    Assessment/Plan:  2. Acute on Chronic Combine Systolic and diastolic Congestive Heart Failure -Patient presented with signs symptoms consistent with acute CHF. She had a transthoracic echocardiogram that was performed on 04/05/2013 showing an ejection fraction of 20-25% with diffuse hypokinesis. -Repeat transthoracic echocardiogram EF 15 to 20 %. Grade 3 diastolic dysfunction. -Relates dyspnea on and off. SBP running soft 89 to 100. Will hold hydralazine. Will consult heart failure team. Cycle enzymes.  -Oxygen saturation 98 RA.  -Appreciate Heart Failure team help.  -Patient S/P right side heart Cath, cardiac out put low, well compensated filling pressure.  -Plan for AV node ablation today.  -SVT runs. Will repeat b-met, check Mg level.  -Lasix on hold today.   1.Right Upper Quadrant Pain.  -Initially there was concern for a cute cholecystitis with her u/s showing thickened  gallbladder wal -She was seen and evaluated by surgery as a HIDA scan did not reveal evidence of cholecystitis.  -Liver enzymes trending down. -Suspect hepatic congestion from Heart failure.    3. Stage III chronic kidney disease -Patient's baseline creatinine near 1.7-1.9 -Monitor on IV lasix.  -Cr at 1.8 today.   4. Hypokalemia -replete with 20 meq BID.   5. History of chronic atrial fibrillation -Presently rate controlled with heart rates in the 80s to 90s -discontinue metoprolol  25 mg BID. -Continue with carvedilol dose decrease to 6.2.  -Digoxin discontinue.   6. Anticoagulation -INR at 1.9.  -Pharmacy consult for warfarin dosing  Code Status: Full Code Family Communication:  Care discussed with patient.  Disposition Plan: Continue IV diuresis.    Consultants:  Surgery  Procedures:  HIDA scan Impression:  Normal hepatobiliary nuclear medicine scan demonstrating no evidence  of cholecystitis or biliary obstruction. Ultrasound findings may  have related to relative contraction of the gallbladder at the time  of imaging   HPI/Subjective: She is sedated, just came from Cath. Waiting for AV node ablation. She denies dyspnea, no chest pain.   Objective: Filed Vitals:   04/07/14 1400  BP: 107/53  Pulse: 83  Temp:   Resp: 18    Intake/Output Summary (Last 24 hours) at 04/07/14 1440 Last data filed at 04/07/14 1412  Gross per 24 hour  Intake    540 ml  Output   2150 ml  Net  -1610 ml   Filed Weights   04/05/14 0613 04/06/14 0620 04/07/14 0443  Weight: 77.8 kg (171 lb 8.3 oz) 77.474 kg (170 lb 12.8 oz) 77.6 kg (171 lb 1.2 oz)    Exam:   General:  Awake and alert, following commands, no acute distress  Cardiovascular: Regular rate and rhythm, normal S1S2, no extremity edema, positive JVD  Respiratory:  normal inspiratory effort, bilateral crackles.   Abdomen: Soft, nondistended, having right upper quadrant pain with palpation, no rebound  tenderness  Musculoskeletal: trace edema.   Data Reviewed: Basic Metabolic Panel:  Recent Labs Lab 04/03/14 1020 04/04/14 0520 04/05/14 0420 04/06/14 0010 04/07/14 0429  NA 143 146 144 140 141  K 4.0 3.4* 3.3* 3.5* 3.5*  CL 107 107 106 103 103  CO2 $Re'19 20 21 21 24  'lnK$ GLUCOSE 164* 86 87 90 86  BUN 38* 43* 52* 55* 50*  CREATININE 2.07* 2.03* 1.86* 2.08* 1.84*  CALCIUM 9.2 9.1 8.7 8.7 8.7  MG  --   --   --   --  2.1   Liver Function Tests:  Recent Labs Lab 04/03/14 1056 04/04/14 0520  AST 51* 37  ALT 46* 38*  ALKPHOS 122* 105  BILITOT 3.8* 2.1*  PROT 7.2 6.4  ALBUMIN 3.4* 3.1*   No results found for this basename: LIPASE, AMYLASE,  in the last 168 hours No results found for this basename: AMMONIA,  in the last 168 hours CBC:  Recent Labs Lab 04/03/14 1020 04/04/14 0520 04/05/14 0420 04/06/14 0010 04/07/14 0429  WBC 8.5 7.1 6.4 6.1 5.4  NEUTROABS 6.0  --   --   --   --   HGB 11.7* 11.3* 11.4* 11.2* 11.3*  HCT 34.9* 34.1* 34.5* 33.7* 35.2*  MCV 86.6 88.8 88.0 86.6 89.8  PLT 192 170 199 220 230   Cardiac Enzymes:  Recent Labs Lab 04/03/14 1020 04/03/14 1850 04/05/14 1312 04/05/14 1844 04/06/14 0010  TROPONINI <0.30 <0.30 <0.30 <0.30 <0.30   BNP (last 3 results)  Recent Labs  04/03/14 1056 04/05/14 1312  PROBNP 19030.0* 5310.0*   CBG:  Recent Labs Lab 04/04/14 0036 04/04/14 0512 04/04/14 1612 04/04/14 2104 04/05/14 0633  GLUCAP 85 88 90 108* 94    No results found for this or any previous visit (from the past 240 hour(s)).   Studies: No results found.  Scheduled Meds: . [facility] HOLD] atorvastatin  20 mg Oral Daily  . [facility] HOLD] carvedilol  6.25 mg Oral BID WC  . [facility] HOLD] colchicine  0.3 mg Oral Daily  . [[facility] HOLD] Febuxostat  1 tablet Oral Daily  . [facility] HOLD] isosorbide mononitrate  60 mg Oral Daily  . [facility] HOLD] metoprolol  2.5 mg Intravenous Once  . [facility] HOLD] potassium chloride SA  20 mEq Oral BID  . sodium chloride  3 mL  Intravenous Q12H  . [facility] HOLD] warfarin  7.5 mg Oral ONCE-1800  . [name].Ping HOLD] Warfarin - Pharmacist Dosing Inpatient   Does not apply q1800   Continuous Infusions:   Principal Problem:   Acute on chronic systolic HF (heart failure) Active Problems:   Atrial fibrillation, chronic   Nonischemic cardiomyopathy   Hypertension   Anemia, normocytic normochromic   Chronic anticoagulation   Cholecystitis, acute   Acute renal failure superimposed on stage 3 chronic kidney disease   Acute on chronic systolic heart failure   Dyspnea    Time spent: 25 min    Damaria Vachon A  [facility] Pager [phone] If 7PM-7AM, please contact night-coverage at [email or web address], password [facility] 04/07/2014, 2:40 PM  LOS: 4 days

## 2014-04-07 NOTE — Progress Notes (Signed)
Pt.is A/ox4 and has no signs of distress and no c/o pain. She had right cardiac cath and a-v ablation today. No bleeding or swelling at the sites noted. Pt.arrived back to the nursing unit from procedural area around 1830. Patient this afternoon had a 5 beat run of v-tach. She was asymptomatic and was resting in bed. Karie Mainland, [place] was called and notified. No new orders written was told to continue to monitor.

## 2014-04-07 NOTE — CV Procedure (Signed)
Cardiac Cath Procedure Note:  Indication:   Procedures performed:  1) Right heart catheterization  Description of procedure:   The risks and indication of the procedure were explained. Consent was signed and placed on the chart. An appropriate timeout was taken prior to the procedure. The right neck was prepped and draped in the routine sterile fashion and anesthetized with 1% local lidocaine.   A 5 FR venous sheath was placed in the right internal jugular vein using a modified Seldinger technique. A standard Swan-Ganz catheter was used for the procedure.   Complications: None apparent.  Findings:  RA = 9 RV = 34/2/6  PA = 34/16 (24)  PCW = 12 Fick cardiac output/index = 3.6/1.8 PVR = 3.3 WU FA sat = 98% PA sat = 50%, 52%  Assessment: 1. Well compensated filling pressures 2. Low cardiac output  Plan/Discussion:  Volume status looks ok. Cardiac output is low but not as low as I expected. Will see how she responds to AVN ablation and possible restoration of sinus rhythm per EP. If not improved will use milrinone. Hold diuretics today. Will need f/u in HF clinic.   Arvilla Meres MD 8:21 AM

## 2014-04-07 NOTE — Progress Notes (Signed)
Site area: rt groin femoral venous and arterial sheath Site Prior to Removal:  Level 0 Pressure Applied For: 20 Manual:   yes Patient Status During Pull:  stable Post Pull Site:  Level 0 Post Pull Instructions Given:  yes Post Pull Pulses Present: yes Dressing Applied:  tegaderm Bedrest begins @ 1815 Comments: no complications

## 2014-04-07 NOTE — Progress Notes (Signed)
Patient had a 5 beat run of v-tach. She is asymptomatic. VS were done. Paged MD to notify.

## 2014-04-07 NOTE — H&P (View-Only) (Signed)
Advanced Heart Failure Rounding Note   Subjective:    [name] is a 80 yo female with a history of NICM (EF 15-20%) s/p BiV ICD, HTN, chronic systolic HF, afib s/p AV node ablation, CVA, DM2 and CKD stage IV.   She presented to [facility] ED with 2d history of persistent nausea and vomiting, abdominal pain, loss of appetitie and worsening SOB. Pertinent labs in the ED were creatinine 2.7, alkaline phosphatase 122, AST 51, ALT, 46 total bili 3.8, proBNP 19,030 and lactic acid 1.7. Chest x-ray Cardiomegaly. Small right pleural effusion. Concern for cholecystitis d/t thickened edematous gallbladder wall without definitive gallstones. Surgery consulted and started on empiric abx. She was started on IV diuretics and admitted to the hospital.   Reports since April when she got her BiV that she has not done that well. She is only able to walk about 100 ft before having to stop to get her breath. She is active at home and able to perform all ADLs and cook but has to take breaks. She reports CP that lasts a couple of seconds that comes and goes and happens at least once a week. Does not weigh daily and reports that weight at home has actually been trending down.   ECHO: EF 15-20%, global HK, grade III DD, severe MR, RA mod dilated, mod TR, RV fx nl  ICD interrogated: BiV pacing only about 2/3 time, some NSR but more afib. Weight down 1 lb and 24 hr I/O -1.2 liters.    Objective:   Weight Range:  Vital Signs:   Temp:  [97.5 F (36.4 C)-98.6 F (37 C)] 97.5 F (36.4 C) (08/06 0620) Pulse Rate:  [66-89] 66 (08/06 0900) Resp:  [18-20] 18 (08/06 0900) BP: (86-99)/(44-63) 99/56 mmHg (08/06 1137) SpO2:  [100 %] 100 % (08/06 0900) Weight:  [170 lb 12.8 oz (77.474 kg)] 170 lb 12.8 oz (77.474 kg) (08/06 0620) Last BM Date: 04/04/14  Weight change: Filed Weights   04/04/14 0509 04/05/14 0613 04/06/14 0620  Weight: 180 lb 5.4 oz (81.8 kg) 171 lb 8.3 oz (77.8 kg) 170 lb 12.8 oz (77.474 kg)     Intake/Output:   Intake/Output Summary (Last 24 hours) at 04/06/14 1258 Last data filed at 04/06/14 1243  Gross per 24 hour  Intake    840 ml  Output   2175 ml  Net  -1335 ml     Physical Exam: General: Fatigued appearing. No resp difficulty, lying flat in bed  HEENT: normal  Neck: supple. JVP 10-11 with prominent CV waves . Carotids 2+ bilat; no bruits. No lymphadenopathy or thryomegaly appreciated.  Cor: PMI laterally displaced. Regular rate & rhythm. Palpable s3, + systolic murmur  Lungs: clear  Abdomen: soft, tender RUQ, nondistended. Lived edge down No bruits or masses. Good bowel sounds.  Extremities: no cyanosis, clubbing, rash, 1+ bilateral edema  Neuro: alert & orientedx3, cranial nerves grossly intact. moves all 4 extremities w/o difficulty. Affect pleasant  Telemetry: V pacing 70s  Labs: Basic Metabolic Panel:  Recent Labs Lab 04/03/14 1020 04/04/14 0520 04/05/14 0420 04/06/14 0010  NA 143 146 144 140  K 4.0 3.4* 3.3* 3.5*  CL 107 107 106 103  CO2 19 20 21 21   GLUCOSE 164* 86 87 90  BUN 38* 43* 52* 55*  CREATININE 2.07* 2.03* 1.86* 2.08*  CALCIUM 9.2 9.1 8.7 8.7    Liver Function Tests:  Recent Labs Lab 04/03/14 1056 04/04/14 0520  AST 51* 37  ALT 46* 38*  ALKPHOS 122* 105  BILITOT 3.8* 2.1*  PROT 7.2 6.4  ALBUMIN 3.4* 3.1*   No results found for this basename: LIPASE, AMYLASE,  in the last 168 hours No results found for this basename: AMMONIA,  in the last 168 hours  CBC:  Recent Labs Lab 04/03/14 1020 04/04/14 0520 04/05/14 0420 04/06/14 0010  WBC 8.5 7.1 6.4 6.1  NEUTROABS 6.0  --   --   --   HGB 11.7* 11.3* 11.4* 11.2*  HCT 34.9* 34.1* 34.5* 33.7*  MCV 86.6 88.8 88.0 86.6  PLT 192 170 199 220    Cardiac Enzymes:  Recent Labs Lab 04/03/14 1020 04/03/14 1850 04/05/14 1312 04/05/14 1844 04/06/14 0010  TROPONINI <0.30 <0.30 <0.30 <0.30 <0.30    BNP: BNP (last 3 results)  Recent Labs  04/03/14 1056  04/05/14 1312  PROBNP 19030.0* 5310.0*     Other results:    Imaging:  No results found.   Medications:     Scheduled Medications: . atorvastatin  20 mg Oral Daily  . carvedilol  6.25 mg Oral BID WC  . colchicine  0.3 mg Oral Daily  . Febuxostat  1 tablet Oral Daily  . furosemide  80 mg Intravenous BID  . isosorbide mononitrate  60 mg Oral Daily  . metoprolol  2.5 mg Intravenous Once  . potassium chloride SA  20 mEq Oral BID  . sodium chloride  3 mL Intravenous Q12H  . sodium chloride  3 mL Intravenous Q12H  . warfarin  5 mg Oral ONCE-1800  . Warfarin - Pharmacist Dosing Inpatient   Does not apply q1800     Infusions:     PRN Medications:  sodium chloride, acetaminophen, acetaminophen, bisacodyl, HYDROcodone-acetaminophen, nitroGLYCERIN, ondansetron (ZOFRAN) IV, ondansetron, sodium chloride   Assessment:   1) A/C combined systolic/disatolic HF  2) NICM  - s/p BiV ICD  3) Afib, chronic  - s/p AV node ablation 4) Chronic kidney disease, stage IV  5) Hypokalemia  6) Mod TR   Plan/Discussion:     Length of Stay: 3 [name], [name] 04/06/2014, 12:58 PM  Advanced Heart Failure Team Pager [phone] (M-F; 7a - 4p)  Please contact [facility] for night-coverage after hours (4p -7a ) and weekends on [email or web address]  Patient seen and examined with Brandi Cosgrove, NP. We discussed all aspects of the encounter. I agree with the assessment and plan as stated above.   She is feeling some better with diuresis but still with very prominent S3 and renal function getting worse. I suspect she is low output. ICD interrogation reviewed. She has mostly AF. Apparently she had AVN ablation in 2010 (though I can find no record of this in Epic anywhere including notes from 2010). There was a mention of CHB. On ICD interrogation she conducts through the AVN about 1/3 of the time and this has interfered with BiV pacing. I discussed the options with Dr. Taylor including attempting to  restore NSR with amio support vs AVN ablation. He feels AVN ablation would be the quickest way to restore BiV pacing and may help her. We will plan for this tomorrow. We will also plan for RHC. If low output may benefit from milrinone as well. We will then revisit attempting to restore NSR. Hold coumadin tonight. Would hold lasix for now as volume not that high on Optivol.   Daniel Bensimhon,MD 3:47 PM   

## 2014-04-07 NOTE — Progress Notes (Signed)
Order for sheath removal verified per post procedural orders. Procedure explained to patient and Rt Internal Juglar vein  access site assessed: level 0.,*5 [place] Sheath removed and manual pressure applied for 15 minutes. Pre, peri, & post procedural vitals: HR 78,16, O2 Sat 98% RA, BP 109/67, Pain 0. Distal pulses remained intact after sheath removal. Access site level 0 and dressed with 4X4 gauze and tegaderm.  Vernona Rieger, RN confirmed condition of site. Post procedural instructions discussed with return demonstration from patient. Pt tranfered back to [facility].  Report called to [facility] RN

## 2014-04-07 NOTE — Interval H&P Note (Signed)
History and Physical Interval Note:  04/07/2014 8:21 AM  [name]  has presented today for surgery, with the diagnosis of heart failure  The various methods of treatment have been discussed with the patient and family. After consideration of risks, benefits and other options for treatment, the patient has consented to  Procedure(s): RIGHT HEART CATH (N/A) as a surgical intervention .  The patient's history has been reviewed, patient examined, no change in status, stable for surgery.  I have reviewed the patient's chart and labs.  Questions were answered to the patient's satisfaction.     Daniel Bensimhon

## 2014-04-07 NOTE — CV Procedure (Signed)
EP Procedure Note  Procedure: AV node ablation  Indication: recurrent AV node conduction  Description of the procedure: After informed consent was obtained, the patient was taken to the diagnostic EP lab in the fasting state. After the usual preparation and draping, a 7 French quadripolar ablation catheter was inserted percutaneously into the right femoral vein and advanced under fluoroscopic guidance to the AV node. There appear to be extensive scar tissue in this region. 3 radiofrequency energy applications were delivered. The patient at baseline had what appeared to be junctional rhythm at a rate of 45 beats per minute. During RF energy application, the accelerated junctional rhythm increased to rates of up to over 60 beats per minute. In addition there was ventricular ectopy. At this point there appeared to be persistent AV conduction with a junctional escape rate of 40 beats per minute. The right femoral artery was then punctured and a 7 [place] quadripolar ablation catheter was inserted percutaneously into the right femoral artery and advanced under fluoroscopic guidance, retrograde across the aortic valve. Additional radiofrequency energy application was delivered, and there was again accelerated junctional rhythm. AV conduction appeared to persist although slower. In addition there was dense ventricular ectopy. 5000 units of heparin was given. Additional RF energy application was delivered both to the left and right side of the AV node. The patient had persistent junctional rhythm. Careful mapping was carried out demonstrating no HV interval. At this point it appeared that there was no longer any intact AV conduction, but that there was accelerated junctional rhythm with a narrow QRS escape and intermittent PVCs. No additional radiofrequency energy application was delivered, and the patient's catheters were removed, and she was returned to the recovery area in satisfactory condition.  Complications:  There were no medial complications. Conclusion. Successful repeat AV node ablation. The patient did have accelerated junctional rhythm persistent. There is also dense ventricular ectopy.  Lewayne Bunting, M.D.

## 2014-04-08 DIAGNOSIS — I428 Other cardiomyopathies: Secondary | ICD-10-CM

## 2014-04-08 LAB — CBC
HCT: 34.9 % — ABNORMAL LOW (ref 36.0–46.0)
HEMOGLOBIN: 11.2 g/dL — AB (ref 12.0–15.0)
MCH: 28 pg (ref 26.0–34.0)
MCHC: 32.1 g/dL (ref 30.0–36.0)
MCV: 87.3 fL (ref 78.0–100.0)
Platelets: 234 10*3/uL (ref 150–400)
RBC: 4 MIL/uL (ref 3.87–5.11)
RDW: 14.5 % (ref 11.5–15.5)
WBC: 5.5 10*3/uL (ref 4.0–10.5)

## 2014-04-08 LAB — BASIC METABOLIC PANEL
Anion gap: 14 (ref 5–15)
BUN: 49 mg/dL — AB (ref 6–23)
CHLORIDE: 103 meq/L (ref 96–112)
CO2: 25 mEq/L (ref 19–32)
Calcium: 8.9 mg/dL (ref 8.4–10.5)
Creatinine, Ser: 1.86 mg/dL — ABNORMAL HIGH (ref 0.50–1.10)
GFR calc Af Amer: 29 mL/min — ABNORMAL LOW (ref >90)
GFR, EST NON AFRICAN AMERICAN: 25 mL/min — AB (ref >90)
GLUCOSE: 95 mg/dL (ref 70–99)
Potassium: 3.9 mEq/L (ref 3.7–5.3)
Sodium: 142 mEq/L (ref 137–147)

## 2014-04-08 LAB — PROTIME-INR
INR: 1.63 — ABNORMAL HIGH (ref 0.00–1.49)
Prothrombin Time: 19.3 seconds — ABNORMAL HIGH (ref 11.6–15.2)

## 2014-04-08 MED ORDER — MILRINONE IN DEXTROSE 20 MG/100ML IV SOLN
0.1250 ug/kg/min | INTRAVENOUS | Status: DC
  Administered 2014-04-08 – 2014-04-09 (×2): 0.125 ug/kg/min via INTRAVENOUS
  Filled 2014-04-08 (×2): qty 100

## 2014-04-08 MED ORDER — WARFARIN SODIUM 7.5 MG PO TABS
7.5000 mg | ORAL_TABLET | Freq: Once | ORAL | Status: AC
  Administered 2014-04-08: 7.5 mg via ORAL
  Filled 2014-04-08: qty 1

## 2014-04-08 NOTE — Progress Notes (Signed)
8 beat run of VTACH noted.  Asymptomatic.  Episodic tachycardia, heart rate 130-140.  Dr. Sunnie Nielsen notified via text message.  Millirone gtt infusing as ordered.  Less short of breath at this time.

## 2014-04-08 NOTE — Progress Notes (Signed)
ANTICOAGULATION CONSULT NOTE - Follow-up Consult  Pharmacy Consult for coumadin Indication: atrial fibrillation  Allergies  Allergen Reactions  . Tuberculin Tests Swelling    Patient Measurements: Height: 5\' 9"  (175.3 cm) Weight: 169 lb 8.5 oz (76.9 kg) IBW/kg (Calculated) : 66.2   Vital Signs: Temp: 97.4 F (36.3 C) (08/08 1043) Temp src: Oral (08/08 1043) BP: 122/94 mmHg (08/08 1043) Pulse Rate: 76 (08/08 1043)  Labs:  Recent Labs  04/05/14 1312 04/05/14 1844  04/06/14 0010 04/07/14 0429 04/07/14 1907 04/08/14 0400  HGB  --   --   < > 11.2* 11.3*  --  11.2*  HCT  --   --   --  33.7* 35.2*  --  34.9*  PLT  --   --   --  220 230  --  234  LABPROT  --   --   --  24.6* 21.9*  --  19.3*  INR  --   --   --  2.22* 1.91*  --  1.63*  CREATININE  --   --   < > 2.08* 1.84* 1.71* 1.86*  TROPONINI <0.30 <0.30  --  <0.30  --   --   --   < > = values in this interval not displayed.  Estimated Creatinine Clearance: 26.1 ml/min (by C-G formula based on Cr of 1.86).   Assessment: 80 yo female transferred from [facility] 8/3 for surgical evaluation for possible cholecystitis. Coumadin was on hold in case surgery needed. HIDA scan negative on 8/5. INR now slightly SUBtherapeutic at 1.63 after missed dose on 8/5 and held dose yesterday for RHC on 8/6. H/H remains slightly low but stable, plt wnl with no reported s/s bleeding.  Home dose: 5mg  daily except for 7.5mg  on Thur. Last home dose 7/31.  Goal of Therapy:  Monitor platelets by anticoagulation protocol: Yes   Plan:  - Give increased dose of Coumadin 7.5 mg PO x 1 tonight - Continue daily INR - Monitor CBC, s/s bleeding  Vinnie Level, PharmD.  Clinical Pharmacist Pager [phone]

## 2014-04-08 NOTE — Progress Notes (Signed)
[facility] PROGRESS NOTE  [name] BMW:[id number] DOB: 08-29-1933 DOA: 04/03/2014 PCP: Lanette Hampshire, MD Interim Summary Patient is a pleasant 80 year old female with a past medical history of chronic systolic congestive heart failure having an ejection fraction of 20-25%, atrial fibrillation currently on anticoagulation who presented to the emergency room at [facility] on 04/03/2014 complaining of shortness of breath, nausea vomiting and right upper quadrant pain. Initial workup in the emergency room included a chest x-ray that showed cardiomegaly without overt failure, BNP of 19,030, Creatinine of 2.07. She was also found to have an elevated bilirubin of 3.0 and alk phos of 122. RUQ US showing thickened gallbladder. She was transferred from [facility] to [facility] for surgical evaluation. Liver enzymes today trending down. HIDA scan did not show evidence of cholecystitis or biliary obstruction.With regard to CHF she was diureses with Lasix 80 mg IV BID. Heart failure team consulted. She underwent right side heart cath.    Assessment/Plan:  2. Acute on Chronic Combine Systolic and diastolic Congestive Heart Failure -Patient presented with signs symptoms consistent with acute CHF.  - transthoracic echocardiogram EF 15 to 20 %. Grade 3 diastolic dysfunction. -Appreciate Heart Failure team help.  -Patient S/P right side heart Cath, cardiac out put low, well compensated filling pressure.  -Patient S/P AV node ablation 8-7. -Started on Milrinone Gtt on 8-8.  -Lasix on hold.   1.Right Upper Quadrant Pain.  -Initially there was concern for a cute cholecystitis with her u/s showing thickened gallbladder wal -She was seen and evaluated by surgery as a HIDA scan did not reveal evidence of cholecystitis.  -Liver enzymes trending down. -Suspect hepatic congestion from Heart failure.    3. Stage III chronic kidney disease -Patient's baseline creatinine near  1.7-1.9 -Monitor on IV lasix.  -Cr at 1.8 today.   4. Hypokalemia K at 3.9. Lasix on hold. Will discontinue schedule potassium.   5. History of chronic atrial fibrillation -Presently rate controlled with heart rates in the 80s to 90s -discontinue metoprolol  25 mg BID. -Continue with carvedilol dose decrease to 6.2.  -Digoxin discontinue.   6. Anticoagulation -INR at 1.6.  -Pharmacy consult for warfarin dosing  Code Status: Full Code Family Communication:  Care discussed with patient.  Disposition Plan: Continue IV diuresis.    Consultants:  Surgery  Procedures:  HIDA scan Impression:  Normal hepatobiliary nuclear medicine scan demonstrating no evidence  of cholecystitis or biliary obstruction. Ultrasound findings may  have related to relative contraction of the gallbladder at the time  of imaging   HPI/Subjective: Relates nausea, no abdominal pain.  No worsening dyspnea.   Objective: Filed Vitals:   04/08/14 1043  BP: 122/94  Pulse: 76  Temp: 97.4 F (36.3 C)  Resp:     Intake/Output Summary (Last 24 hours) at 04/08/14 1500 Last data filed at 04/08/14 2725  Gross per 24 hour  Intake      0 ml  Output    950 ml  Net   -950 ml   Filed Weights   04/06/14 0620 04/07/14 0443 04/08/14 0545  Weight: 77.474 kg (170 lb 12.8 oz) 77.6 kg (171 lb 1.2 oz) 76.9 kg (169 lb 8.5 oz)    Exam:   General: Awake and alert, following commands, no acute distress, chronic ill appearing  Cardiovascular: normal S1S2,  positive JVD  Respiratory:  normal inspiratory effort, bilateral crackles.   Abdomen: Soft, nondistended, NT, no rebound tenderness  Musculoskeletal: trace edema. Upper extremities with  edema.   Data Reviewed: Basic Metabolic Panel:  Recent Labs Lab 04/05/14 0420 04/06/14 0010 04/07/14 0429 04/07/14 1907 04/08/14 0400  NA 144 140 141 142 142  K 3.3* 3.5* 3.5* 4.2 3.9  CL 106 103 103 102 103  CO2 $Re'21 21 24 26 25  'fbA$ GLUCOSE 87 90 86 111* 95   BUN 52* 55* 50* 43* 49*  CREATININE 1.86* 2.08* 1.84* 1.71* 1.86*  CALCIUM 8.7 8.7 8.7 9.1 8.9  MG  --   --  2.1 2.1  --    Liver Function Tests:  Recent Labs Lab 04/03/14 1056 04/04/14 0520  AST 51* 37  ALT 46* 38*  ALKPHOS 122* 105  BILITOT 3.8* 2.1*  PROT 7.2 6.4  ALBUMIN 3.4* 3.1*   No results found for this basename: LIPASE, AMYLASE,  in the last 168 hours No results found for this basename: AMMONIA,  in the last 168 hours CBC:  Recent Labs Lab 04/03/14 1020 04/04/14 0520 04/05/14 0420 04/06/14 0010 04/07/14 0429 04/08/14 0400  WBC 8.5 7.1 6.4 6.1 5.4 5.5  NEUTROABS 6.0  --   --   --   --   --   HGB 11.7* 11.3* 11.4* 11.2* 11.3* 11.2*  HCT 34.9* 34.1* 34.5* 33.7* 35.2* 34.9*  MCV 86.6 88.8 88.0 86.6 89.8 87.3  PLT 192 170 199 220 230 234   Cardiac Enzymes:  Recent Labs Lab 04/03/14 1020 04/03/14 1850 04/05/14 1312 04/05/14 1844 04/06/14 0010  TROPONINI <0.30 <0.30 <0.30 <0.30 <0.30   BNP (last 3 results)  Recent Labs  04/03/14 1056 04/05/14 1312  PROBNP 19030.0* 5310.0*   CBG:  Recent Labs Lab 04/04/14 0512 04/04/14 1612 04/04/14 2104 04/05/14 0633 04/07/14 1631  GLUCAP 88 90 108* 94 96    No results found for this or any previous visit (from the past 240 hour(s)).   Studies: No results found.  Scheduled Meds: . atorvastatin  20 mg Oral Daily  . carvedilol  6.25 mg Oral BID WC  . colchicine  0.3 mg Oral Daily  . Febuxostat  1 tablet Oral Daily  . isosorbide mononitrate  60 mg Oral Daily  . metoprolol  2.5 mg Intravenous Once  . sodium chloride  3 mL Intravenous Q12H  . sodium chloride  3 mL Intravenous Q12H  . warfarin  7.5 mg Oral ONCE-1800  . Warfarin - Pharmacist Dosing Inpatient   Does not apply q1800   Continuous Infusions: . milrinone 0.125 mcg/kg/min (04/08/14 1241)    Principal Problem:   Acute on chronic systolic HF (heart failure) Active Problems:   Atrial fibrillation, chronic   Nonischemic cardiomyopathy    Hypertension   Anemia, normocytic normochromic   Chronic anticoagulation   Cholecystitis, acute   Acute renal failure superimposed on stage 3 chronic kidney disease   Acute on chronic systolic heart failure   Dyspnea    Time spent: 25 min    Declynn Lopresti, [place] [facility] Pager [phone] If 7PM-7AM, please contact night-coverage at [email or web address], password [facility] 04/08/2014, 3:00 PM  LOS: 5 days

## 2014-04-08 NOTE — Progress Notes (Signed)
DAILY PROGRESS NOTE  Subjective:  No appetite today. Feels full across her belly. Nauseated. S/p AVN re-ablation yesterday with hopefully a successful result. There was a junctional rhythm with ventricular ectopy noted at the end of the case. Recent RHC demonstrated compensated low output heart failure.  She is another -1.9L negative yesterday.  Objective:  Temp:  [97.4 F (36.3 C)-98 F (36.7 C)] 97.4 F (36.3 C) (08/08 1043) Pulse Rate:  [40-86] 76 (08/08 1043) Resp:  [14-20] 20 (08/08 0545) BP: (94-139)/(49-94) 122/94 mmHg (08/08 1043) SpO2:  [98 %-100 %] 99 % (08/08 1043) Weight:  [169 lb 8.5 oz (76.9 kg)] 169 lb 8.5 oz (76.9 kg) (08/08 0545) Weight change: -1 lb 8.7 oz (-0.7 kg)  Intake/Output from previous day: 08/07 0701 - 08/08 0700 In: -  Out: 1900 [Urine:1900]  Intake/Output from this shift:    Medications: Current Facility-Administered Medications  Medication Dose Route Frequency Provider Last Rate Last Dose  . 0.9 %  sodium chloride infusion  250 mL Intravenous PRN Jolaine Artist, MD      . 0.9 %  sodium chloride infusion  250 mL Intravenous PRN Evans Lance, MD      . acetaminophen (TYLENOL) suppository 650 mg  650 mg Rectal Q6H PRN Radene Gunning, NP      . acetaminophen (TYLENOL) tablet 650 mg  650 mg Oral Q4H PRN Evans Lance, MD      . atorvastatin (LIPITOR) tablet 20 mg  20 mg Oral Daily Lezlie Octave Black, NP   20 mg at 04/08/14 1045  . bisacodyl (DULCOLAX) suppository 10 mg  10 mg Rectal Daily PRN Radene Gunning, NP      . carvedilol (COREG) tablet 6.25 mg  6.25 mg Oral BID WC Jolaine Artist, MD   6.25 mg at 04/08/14 0839  . colchicine tablet 0.3 mg  0.3 mg Oral Daily Kelvin Cellar, MD   0.3 mg at 04/08/14 1045  . Febuxostat TABS 80 mg  1 tablet Oral Daily Lezlie Octave Black, NP   80 mg at 04/08/14 1044  . HYDROcodone-acetaminophen (NORCO) 7.5-325 MG per tablet 1 tablet  1 tablet Oral Q4H PRN Radene Gunning, NP   1 tablet at 04/07/14 1054  .  isosorbide mononitrate (IMDUR) 24 hr tablet 60 mg  60 mg Oral Daily Lezlie Octave Black, NP   60 mg at 04/08/14 1045  . metoprolol (LOPRESSOR) injection 2.5 mg  2.5 mg Intravenous Once Kathie Dike, MD      . nitroGLYCERIN (NITROSTAT) SL tablet 0.4 mg  0.4 mg Sublingual Q5 min PRN Radene Gunning, NP      . ondansetron [facility]) injection 4 mg  4 mg Intravenous Q6H PRN Evans Lance, MD      . potassium chloride SA (K-DUR,KLOR-CON) CR tablet 20 mEq  20 mEq Oral BID Ritta Slot, NP   20 mEq at 04/08/14 1045  . sodium chloride 0.9 % injection 3 mL  3 mL Intravenous Q12H Jolaine Artist, MD   3 mL at 04/08/14 1046  . sodium chloride 0.9 % injection 3 mL  3 mL Intravenous PRN Jolaine Artist, MD      . sodium chloride 0.9 % injection 3 mL  3 mL Intravenous Q12H Evans Lance, MD   3 mL at 04/08/14 1046  . sodium chloride 0.9 % injection 3 mL  3 mL Intravenous PRN Evans Lance, MD      . warfarin (COUMADIN)  tablet 7.5 mg  7.5 mg Oral ONCE-1800 Darnell Level Mancheril, RPH      . Warfarin - Pharmacist Dosing Inpatient   Does not apply q1800 Sindy Guadeloupe, [facility]        Physical Exam: General appearance: alert and no distress Neck: JVD - 3 cm above sternal notch, no carotid bruit and supple, symmetrical, trachea midline Lungs: diminished breath sounds bilaterally Heart: regular rate and rhythm Abdomen: soft, mild TTP, mild hepatomegaly Extremities: edema 1+ bilateral edema Pulses: 2+ and symmetric Skin: cool, dry  Lab Results: Results for orders placed during the hospital encounter of 04/03/14 (from the past 48 hour(s))  PROTIME-INR     Status: Abnormal   Collection Time    04/07/14  4:29 AM      Result Value Ref Range   Prothrombin Time 21.9 (*) 11.6 - 15.2 seconds   INR 1.91 (*) 0.00 - 7.10  BASIC METABOLIC PANEL     Status: Abnormal   Collection Time    04/07/14  4:29 AM      Result Value Ref Range   Sodium 141  137 - 147 mEq/L   Potassium 3.5 (*) 3.7 - 5.3 mEq/L   Chloride 103   96 - 112 mEq/L   CO2 24  19 - 32 mEq/L   Glucose, Bld 86  70 - 99 mg/dL   BUN 50 (*) 6 - 23 mg/dL   Creatinine, Ser 1.84 (*) 0.50 - 1.10 mg/dL   Calcium 8.7  8.4 - 10.5 mg/dL   GFR calc non Af Amer 25 (*) >90 mL/min   GFR calc Af Amer 29 (*) >90 mL/min   Comment: (NOTE)     The eGFR has been calculated using the CKD EPI equation.     This calculation has not been validated in all clinical situations.     eGFR's persistently <90 mL/min signify possible Chronic Kidney     Disease.   Anion gap 14  5 - 15  CBC     Status: Abnormal   Collection Time    04/07/14  4:29 AM      Result Value Ref Range   WBC 5.4  4.0 - 10.5 K/uL   RBC 3.92  3.87 - 5.11 MIL/uL   Hemoglobin 11.3 (*) 12.0 - 15.0 g/dL   HCT 35.2 (*) 36.0 - 46.0 %   MCV 89.8  78.0 - 100.0 fL   MCH 28.8  26.0 - 34.0 pg   MCHC 32.1  30.0 - 36.0 g/dL   RDW 14.8  11.5 - 15.5 %   Platelets 230  150 - 400 K/uL  MAGNESIUM     Status: None   Collection Time    04/07/14  4:29 AM      Result Value Ref Range   Magnesium 2.1  1.5 - 2.5 mg/dL  POCT I-STAT 3, VENOUS BLOOD GAS (G3P V)     Status: Abnormal   Collection Time    04/07/14  8:21 AM      Result Value Ref Range   pH, Ven 7.434 (*) 7.250 - 7.300   pCO2, Ven 42.4 (*) 45.0 - 50.0 mmHg   pO2, Ven 26.0 (*) 30.0 - 45.0 mmHg   Bicarbonate 28.4 (*) 20.0 - 24.0 mEq/L   TCO2 30  0 - 100 mmol/L   O2 Saturation 50.0     Acid-Base Excess 4.0 (*) 0.0 - 2.0 mmol/L   Sample type VENOUS     Comment NOTIFIED PHYSICIAN  POCT I-STAT 3, VENOUS BLOOD GAS (G3P V)     Status: Abnormal   Collection Time    04/07/14  8:21 AM      Result Value Ref Range   pH, Ven 7.406 (*) 7.250 - 7.300   pCO2, Ven 40.1 (*) 45.0 - 50.0 mmHg   pO2, Ven 28.0 (*) 30.0 - 45.0 mmHg   Bicarbonate 25.2 (*) 20.0 - 24.0 mEq/L   TCO2 26  0 - 100 mmol/L   O2 Saturation 52.0     Sample type VENOUS     Comment NOTIFIED PHYSICIAN    POCT ACTIVATED CLOTTING TIME     Status: None   Collection Time    04/07/14  3:50 PM       Result Value Ref Range   Activated Clotting Time 236    GLUCOSE, CAPILLARY     Status: None   Collection Time    04/07/14  4:31 PM      Result Value Ref Range   Glucose-Capillary 96  70 - 99 mg/dL  POCT ACTIVATED CLOTTING TIME     Status: None   Collection Time    04/07/14  4:34 PM      Result Value Ref Range   Activated Clotting Time 197    POCT ACTIVATED CLOTTING TIME     Status: None   Collection Time    04/07/14  5:13 PM      Result Value Ref Range   Activated Clotting Time 185    POCT ACTIVATED CLOTTING TIME     Status: None   Collection Time    04/07/14  5:48 PM      Result Value Ref Range   Activated Clotting Time 174    MAGNESIUM     Status: None   Collection Time    04/07/14  7:07 PM      Result Value Ref Range   Magnesium 2.1  1.5 - 2.5 mg/dL  BASIC METABOLIC PANEL     Status: Abnormal   Collection Time    04/07/14  7:07 PM      Result Value Ref Range   Sodium 142  137 - 147 mEq/L   Potassium 4.2  3.7 - 5.3 mEq/L   Chloride 102  96 - 112 mEq/L   CO2 26  19 - 32 mEq/L   Glucose, Bld 111 (*) 70 - 99 mg/dL   BUN 43 (*) 6 - 23 mg/dL   Creatinine, Ser 1.71 (*) 0.50 - 1.10 mg/dL   Calcium 9.1  8.4 - 10.5 mg/dL   GFR calc non Af Amer 27 (*) >90 mL/min   GFR calc Af Amer 32 (*) >90 mL/min   Comment: (NOTE)     The eGFR has been calculated using the CKD EPI equation.     This calculation has not been validated in all clinical situations.     eGFR's persistently <90 mL/min signify possible Chronic Kidney     Disease.   Anion gap 14  5 - 15  PROTIME-INR     Status: Abnormal   Collection Time    04/08/14  4:00 AM      Result Value Ref Range   Prothrombin Time 19.3 (*) 11.6 - 15.2 seconds   INR 1.63 (*) 0.00 - 8.18  BASIC METABOLIC PANEL     Status: Abnormal   Collection Time    04/08/14  4:00 AM      Result Value Ref Range   Sodium 142  137 -  147 mEq/L   Potassium 3.9  3.7 - 5.3 mEq/L   Chloride 103  96 - 112 mEq/L   CO2 25  19 - 32 mEq/L   Glucose,  Bld 95  70 - 99 mg/dL   BUN 49 (*) 6 - 23 mg/dL   Creatinine, Ser 1.86 (*) 0.50 - 1.10 mg/dL   Calcium 8.9  8.4 - 10.5 mg/dL   GFR calc non Af Amer 25 (*) >90 mL/min   GFR calc Af Amer 29 (*) >90 mL/min   Comment: (NOTE)     The eGFR has been calculated using the CKD EPI equation.     This calculation has not been validated in all clinical situations.     eGFR's persistently <90 mL/min signify possible Chronic Kidney     Disease.   Anion gap 14  5 - 15  CBC     Status: Abnormal   Collection Time    04/08/14  4:00 AM      Result Value Ref Range   WBC 5.5  4.0 - 10.5 K/uL   RBC 4.00  3.87 - 5.11 MIL/uL   Hemoglobin 11.2 (*) 12.0 - 15.0 g/dL   HCT 34.9 (*) 36.0 - 46.0 %   MCV 87.3  78.0 - 100.0 fL   MCH 28.0  26.0 - 34.0 pg   MCHC 32.1  30.0 - 36.0 g/dL   RDW 14.5  11.5 - 15.5 %   Platelets 234  150 - 400 K/uL    Imaging: No results found.  Assessment:  1. Principal Problem: 2.   Acute on chronic systolic HF (heart failure) 3. Active Problems: 4.   Atrial fibrillation, chronic 5.   Nonischemic cardiomyopathy 6.   Hypertension 7.   Anemia, normocytic normochromic 8.   Chronic anticoagulation 9.   Cholecystitis, acute 10.   Acute renal failure superimposed on stage 3 chronic kidney disease 11.   Acute on chronic systolic heart failure 12.   Dyspnea 13.   Plan:  1. Poor appetite, nausea and abdominal fullness. Suspect this all related to low output failure. CO-ox was low at 40 (PA sat at cath was 50-52%). BNP remains elevated at 5310. Rhythm appears to be paced. Would recommend trial of milrinone today.  Time Spent Directly with Patient:  15 minutes  Length of Stay:  LOS: 5 days   Pixie Casino, MD, [facility] Attending Cardiologist [facility]  Tamitha Norell C 04/08/2014, 12:14 PM

## 2014-04-09 LAB — BASIC METABOLIC PANEL
Anion gap: 14 (ref 5–15)
BUN: 45 mg/dL — ABNORMAL HIGH (ref 6–23)
CHLORIDE: 99 meq/L (ref 96–112)
CO2: 25 mEq/L (ref 19–32)
CREATININE: 1.7 mg/dL — AB (ref 0.50–1.10)
Calcium: 9.1 mg/dL (ref 8.4–10.5)
GFR, EST AFRICAN AMERICAN: 32 mL/min — AB (ref >90)
GFR, EST NON AFRICAN AMERICAN: 28 mL/min — AB (ref >90)
Glucose, Bld: 95 mg/dL (ref 70–99)
Potassium: 3.9 mEq/L (ref 3.7–5.3)
Sodium: 138 mEq/L (ref 137–147)

## 2014-04-09 LAB — CBC
HCT: 35 % — ABNORMAL LOW (ref 36.0–46.0)
Hemoglobin: 11.5 g/dL — ABNORMAL LOW (ref 12.0–15.0)
MCH: 28.7 pg (ref 26.0–34.0)
MCHC: 32.9 g/dL (ref 30.0–36.0)
MCV: 87.3 fL (ref 78.0–100.0)
PLATELETS: 223 10*3/uL (ref 150–400)
RBC: 4.01 MIL/uL (ref 3.87–5.11)
RDW: 14.6 % (ref 11.5–15.5)
WBC: 6.5 10*3/uL (ref 4.0–10.5)

## 2014-04-09 LAB — PRO B NATRIURETIC PEPTIDE: Pro B Natriuretic peptide (BNP): 3361 pg/mL — ABNORMAL HIGH (ref 0–450)

## 2014-04-09 LAB — PROTIME-INR
INR: 1.83 — AB (ref 0.00–1.49)
Prothrombin Time: 21.2 seconds — ABNORMAL HIGH (ref 11.6–15.2)

## 2014-04-09 MED ORDER — FUROSEMIDE 80 MG PO TABS
80.0000 mg | ORAL_TABLET | Freq: Every day | ORAL | Status: DC
  Administered 2014-04-09 – 2014-04-11 (×3): 80 mg via ORAL
  Filled 2014-04-09 (×4): qty 1

## 2014-04-09 MED ORDER — WARFARIN SODIUM 7.5 MG PO TABS
7.5000 mg | ORAL_TABLET | Freq: Once | ORAL | Status: AC
  Administered 2014-04-09: 7.5 mg via ORAL
  Filled 2014-04-09: qty 1

## 2014-04-09 NOTE — Plan of Care (Signed)
Problem: Phase I Progression Outcomes Goal: OOB as tolerated unless otherwise ordered Outcome: Progressing Up to bedside commode with one assist.

## 2014-04-09 NOTE — Progress Notes (Addendum)
DAILY PROGRESS NOTE  Subjective:  Blood pressure higher today on low dose milrinone. Creatinine stable. BNP has improved from 5310 down to 3361. Short run of NSVT noted overnight. Feels better today, less nauseated and bloated, about net even on fluids yesterday without diuresis.   Objective:  Temp:  [97.2 F (36.2 C)-98.1 F (36.7 C)] 98 F (36.7 C) (08/09 0500) Pulse Rate:  [51-80] 80 (08/08 2039) Resp:  [16-18] 16 (08/09 0500) BP: (104-136)/(61-98) 121/65 mmHg (08/09 0500) SpO2:  [99 %-100 %] 100 % (08/09 0500) Weight:  [174 lb 6.1 oz (79.1 kg)] 174 lb 6.1 oz (79.1 kg) (08/09 0500) Weight change: 4 lb 13.6 oz (2.2 kg)  Intake/Output from previous day: 08/08 0701 - 08/09 0700 In: 616 [P.O.:600; I.V.:16] Out: 850 [Urine:850]  Intake/Output from this shift:    Medications: Current Facility-Administered Medications  Medication Dose Route Frequency Provider Last Rate Last Dose  . 0.9 %  sodium chloride infusion  250 mL Intravenous PRN Jolaine Artist, MD      . 0.9 %  sodium chloride infusion  250 mL Intravenous PRN Evans Lance, MD      . acetaminophen (TYLENOL) suppository 650 mg  650 mg Rectal Q6H PRN Radene Gunning, NP      . acetaminophen (TYLENOL) tablet 650 mg  650 mg Oral Q4H PRN Evans Lance, MD      . atorvastatin (LIPITOR) tablet 20 mg  20 mg Oral Daily Lezlie Octave Black, NP   20 mg at 04/08/14 1045  . bisacodyl (DULCOLAX) suppository 10 mg  10 mg Rectal Daily PRN Radene Gunning, NP      . carvedilol (COREG) tablet 6.25 mg  6.25 mg Oral BID WC Jolaine Artist, MD   6.25 mg at 04/09/14 0815  . colchicine tablet 0.3 mg  0.3 mg Oral Daily Kelvin Cellar, MD   0.3 mg at 04/08/14 1045  . Febuxostat TABS 80 mg  1 tablet Oral Daily Lezlie Octave Black, NP   80 mg at 04/08/14 1044  . HYDROcodone-acetaminophen (NORCO) 7.5-325 MG per tablet 1 tablet  1 tablet Oral Q4H PRN Radene Gunning, NP   1 tablet at 04/07/14 1054  . isosorbide mononitrate (IMDUR) 24 hr tablet 60 mg  60  mg Oral Daily Lezlie Octave Black, NP   60 mg at 04/08/14 1045  . metoprolol (LOPRESSOR) injection 2.5 mg  2.5 mg Intravenous Once Kathie Dike, MD      . milrinone (PRIMACOR) 20 MG/100ML (0.2 mg/mL) infusion  0.125 mcg/kg/min Intravenous Continuous Pixie Casino, MD 2.9 mL/hr at 04/08/14 1241 0.125 mcg/kg/min at 04/08/14 1241  . nitroGLYCERIN (NITROSTAT) SL tablet 0.4 mg  0.4 mg Sublingual Q5 min PRN Radene Gunning, NP      . ondansetron [facility]) injection 4 mg  4 mg Intravenous Q6H PRN Evans Lance, MD      . sodium chloride 0.9 % injection 3 mL  3 mL Intravenous Q12H Jolaine Artist, MD   3 mL at 04/08/14 2256  . sodium chloride 0.9 % injection 3 mL  3 mL Intravenous PRN Jolaine Artist, MD      . sodium chloride 0.9 % injection 3 mL  3 mL Intravenous Q12H Evans Lance, MD   3 mL at 04/08/14 2256  . sodium chloride 0.9 % injection 3 mL  3 mL Intravenous PRN Evans Lance, MD      . Warfarin - Pharmacist Dosing Inpatient  Does not apply q1800 Sindy Guadeloupe, [facility]        Physical Exam: General appearance: alert and no distress Neck: JVD - 3 cm above sternal notch, no carotid bruit and supple, symmetrical, trachea midline Lungs: diminished breath sounds bilaterally Heart: regular rate and rhythm Abdomen: soft, mild TTP, mild hepatomegaly Extremities: edema 1+ bilateral edema Pulses: 2+ and symmetric Skin: cool, dry  Lab Results: Results for orders placed during the hospital encounter of 04/03/14 (from the past 48 hour(s))  POCT ACTIVATED CLOTTING TIME     Status: None   Collection Time    04/07/14  3:50 PM      Result Value Ref Range   Activated Clotting Time 236    GLUCOSE, CAPILLARY     Status: None   Collection Time    04/07/14  4:31 PM      Result Value Ref Range   Glucose-Capillary 96  70 - 99 mg/dL  POCT ACTIVATED CLOTTING TIME     Status: None   Collection Time    04/07/14  4:34 PM      Result Value Ref Range   Activated Clotting Time 197    POCT ACTIVATED  CLOTTING TIME     Status: None   Collection Time    04/07/14  5:13 PM      Result Value Ref Range   Activated Clotting Time 185    POCT ACTIVATED CLOTTING TIME     Status: None   Collection Time    04/07/14  5:48 PM      Result Value Ref Range   Activated Clotting Time 174    MAGNESIUM     Status: None   Collection Time    04/07/14  7:07 PM      Result Value Ref Range   Magnesium 2.1  1.5 - 2.5 mg/dL  BASIC METABOLIC PANEL     Status: Abnormal   Collection Time    04/07/14  7:07 PM      Result Value Ref Range   Sodium 142  137 - 147 mEq/L   Potassium 4.2  3.7 - 5.3 mEq/L   Chloride 102  96 - 112 mEq/L   CO2 26  19 - 32 mEq/L   Glucose, Bld 111 (*) 70 - 99 mg/dL   BUN 43 (*) 6 - 23 mg/dL   Creatinine, Ser 1.71 (*) 0.50 - 1.10 mg/dL   Calcium 9.1  8.4 - 10.5 mg/dL   GFR calc non Af Amer 27 (*) >90 mL/min   GFR calc Af Amer 32 (*) >90 mL/min   Comment: (NOTE)     The eGFR has been calculated using the CKD EPI equation.     This calculation has not been validated in all clinical situations.     eGFR's persistently <90 mL/min signify possible Chronic Kidney     Disease.   Anion gap 14  5 - 15  PROTIME-INR     Status: Abnormal   Collection Time    04/08/14  4:00 AM      Result Value Ref Range   Prothrombin Time 19.3 (*) 11.6 - 15.2 seconds   INR 1.63 (*) 0.00 - 1.61  BASIC METABOLIC PANEL     Status: Abnormal   Collection Time    04/08/14  4:00 AM      Result Value Ref Range   Sodium 142  137 - 147 mEq/L   Potassium 3.9  3.7 - 5.3 mEq/L   Chloride 103  96 -  112 mEq/L   CO2 25  19 - 32 mEq/L   Glucose, Bld 95  70 - 99 mg/dL   BUN 49 (*) 6 - 23 mg/dL   Creatinine, Ser 1.86 (*) 0.50 - 1.10 mg/dL   Calcium 8.9  8.4 - 10.5 mg/dL   GFR calc non Af Amer 25 (*) >90 mL/min   GFR calc Af Amer 29 (*) >90 mL/min   Comment: (NOTE)     The eGFR has been calculated using the CKD EPI equation.     This calculation has not been validated in all clinical situations.     eGFR's  persistently <90 mL/min signify possible Chronic Kidney     Disease.   Anion gap 14  5 - 15  CBC     Status: Abnormal   Collection Time    04/08/14  4:00 AM      Result Value Ref Range   WBC 5.5  4.0 - 10.5 K/uL   RBC 4.00  3.87 - 5.11 MIL/uL   Hemoglobin 11.2 (*) 12.0 - 15.0 g/dL   HCT 34.9 (*) 36.0 - 46.0 %   MCV 87.3  78.0 - 100.0 fL   MCH 28.0  26.0 - 34.0 pg   MCHC 32.1  30.0 - 36.0 g/dL   RDW 14.5  11.5 - 15.5 %   Platelets 234  150 - 400 K/uL  PROTIME-INR     Status: Abnormal   Collection Time    04/09/14  4:24 AM      Result Value Ref Range   Prothrombin Time 21.2 (*) 11.6 - 15.2 seconds   INR 1.83 (*) 0.00 - 8.84  BASIC METABOLIC PANEL     Status: Abnormal   Collection Time    04/09/14  4:24 AM      Result Value Ref Range   Sodium 138  137 - 147 mEq/L   Potassium 3.9  3.7 - 5.3 mEq/L   Chloride 99  96 - 112 mEq/L   CO2 25  19 - 32 mEq/L   Glucose, Bld 95  70 - 99 mg/dL   BUN 45 (*) 6 - 23 mg/dL   Creatinine, Ser 1.70 (*) 0.50 - 1.10 mg/dL   Calcium 9.1  8.4 - 10.5 mg/dL   GFR calc non Af Amer 28 (*) >90 mL/min   GFR calc Af Amer 32 (*) >90 mL/min   Comment: (NOTE)     The eGFR has been calculated using the CKD EPI equation.     This calculation has not been validated in all clinical situations.     eGFR's persistently <90 mL/min signify possible Chronic Kidney     Disease.   Anion gap 14  5 - 15  PRO B NATRIURETIC PEPTIDE     Status: Abnormal   Collection Time    04/09/14  4:24 AM      Result Value Ref Range   Pro B Natriuretic peptide (BNP) 3361.0 (*) 0 - 450 pg/mL  CBC     Status: Abnormal   Collection Time    04/09/14  4:24 AM      Result Value Ref Range   WBC 6.5  4.0 - 10.5 K/uL   RBC 4.01  3.87 - 5.11 MIL/uL   Hemoglobin 11.5 (*) 12.0 - 15.0 g/dL   HCT 35.0 (*) 36.0 - 46.0 %   MCV 87.3  78.0 - 100.0 fL   MCH 28.7  26.0 - 34.0 pg   MCHC 32.9  30.0 -  36.0 g/dL   RDW 14.6  11.5 - 15.5 %   Platelets 223  150 - 400 K/uL    Imaging: No results  found.  Assessment:  Principal Problem:   Acute on chronic systolic HF (heart failure) Active Problems:   Atrial fibrillation, chronic   Nonischemic cardiomyopathy   Hypertension   Anemia, normocytic normochromic   Chronic anticoagulation   Cholecystitis, acute   Acute renal failure superimposed on stage 3 chronic kidney disease   Acute on chronic systolic heart failure   Dyspnea   Plan:   Feeling better today on low dose milrinone. Restart home po lasix to maintain euvolemia. ? repeat right heart cath next week to re-evaluate cardiac output on milrinone. Will d/w with Dr. Haroldine Laws.  Time Spent Directly with Patient:  15 minutes  Length of Stay:  LOS: 6 days   Pixie Casino, MD, [facility] Attending Cardiologist [facility]  Canesha Tesfaye C 04/09/2014, 9:23 AM

## 2014-04-09 NOTE — Progress Notes (Signed)
[facility] PROGRESS NOTE  [name] XBM:[id number] DOB: 03/19/1933 DOA: 04/03/2014 PCP: Lanette Hampshire, MD Interim Summary Patient is a pleasant 80 year old female with a past medical history of chronic systolic congestive heart failure having an ejection fraction of 20-25%, atrial fibrillation currently on anticoagulation who presented to the emergency room at [facility] on 04/03/2014 complaining of shortness of breath, nausea vomiting and right upper quadrant pain. Initial workup in the emergency room included a chest x-ray that showed cardiomegaly without overt failure, BNP of 19,030, Creatinine of 2.07. She was also found to have an elevated bilirubin of 3.0 and alk phos of 122. RUQ US showing thickened gallbladder. She was transferred from [facility] to [facility] for surgical evaluation. Liver enzymes today trending down. HIDA scan did not show evidence of cholecystitis or biliary obstruction.With regard to CHF she was diureses with Lasix 80 mg IV BID. Heart failure team consulted. She underwent right side heart cath.    Assessment/Plan: 2. Acute on Chronic Combine Systolic and diastolic Congestive Heart Failure -Patient presented with signs symptoms consistent with acute CHF.  -Transthoracic echocardiogram EF 15 to 20 %. Grade 3 diastolic dysfunction. -Appreciate Heart Failure team help.  -Patient S/P right side heart Cath, cardiac out put low, well compensated filling pressure.  -Patient S/P AV node ablation 8-7. -Started on Milrinone Gtt on 8-8.  -Started on lasix 80 mg PO daily.   1.Right Upper Quadrant Pain. Improved.  -Initially there was concern for a cute cholecystitis with her u/s showing thickened gallbladder wal -She was seen and evaluated by surgery as a HIDA scan did not reveal evidence of cholecystitis.  -Liver enzymes trending down. -Suspect hepatic congestion from Heart failure.    3. Stage III chronic kidney disease -Patient's baseline  creatinine near 1.7-1.9 -Cr at 1.7 today. Stable.   4. Hypokalemia -Resolved.   5. History of chronic atrial fibrillation -Presently rate controlled with heart rates in the 80s to 90s -discontinue metoprolol  25 mg BID. -Continue with carvedilol dose decrease to 6.2.  -Digoxin discontinue.   6. Anticoagulation -INR at 1.8.  -Pharmacy consult for warfarin dosing  Code Status: Full Code Family Communication:  Care discussed with patient.  Disposition Plan: Continue IV diuresis.    Consultants:  Surgery  Procedures:  HIDA scan Impression:  Normal hepatobiliary nuclear medicine scan demonstrating no evidence  of cholecystitis or biliary obstruction. Ultrasound findings may  have related to relative contraction of the gallbladder at the time  of imaging   HPI/Subjective: Feeling ok, better than yesterday. Denies worsening dyspnea,. Abdominal distension improved.   Objective: Filed Vitals:   04/09/14 1358  BP: 92/51  Pulse: 90  Temp: 98.5 F (36.9 C)  Resp: 18    Intake/Output Summary (Last 24 hours) at 04/09/14 1610 Last data filed at 04/09/14 1529  Gross per 24 hour  Intake    816 ml  Output   1300 ml  Net   -484 ml   Filed Weights   04/07/14 0443 04/08/14 0545 04/09/14 0500  Weight: 77.6 kg (171 lb 1.2 oz) 76.9 kg (169 lb 8.5 oz) 79.1 kg (174 lb 6.1 oz)    Exam:   General: Awake and alert, following commands, no acute distress, chronic ill appearing  Cardiovascular: normal S1S2,  positive JVD  Respiratory:  normal inspiratory effort, bilateral crackles.   Abdomen: Soft, nondistended, NT, no rebound tenderness  Musculoskeletal: trace edema. Upper extremities with edema.   Data Reviewed: Basic Metabolic Panel:  Recent Labs  Lab 04/06/14 0010 04/07/14 0429 04/07/14 1907 04/08/14 0400 04/09/14 0424  NA 140 141 142 142 138  K 3.5* 3.5* 4.2 3.9 3.9  CL 103 103 102 103 99  CO2 $Re'21 24 26 25 25  'CUy$ GLUCOSE 90 86 111* 95 95  BUN 55* 50* 43* 49*  45*  CREATININE 2.08* 1.84* 1.71* 1.86* 1.70*  CALCIUM 8.7 8.7 9.1 8.9 9.1  MG  --  2.1 2.1  --   --    Liver Function Tests:  Recent Labs Lab 04/03/14 1056 04/04/14 0520  AST 51* 37  ALT 46* 38*  ALKPHOS 122* 105  BILITOT 3.8* 2.1*  PROT 7.2 6.4  ALBUMIN 3.4* 3.1*   No results found for this basename: LIPASE, AMYLASE,  in the last 168 hours No results found for this basename: AMMONIA,  in the last 168 hours CBC:  Recent Labs Lab 04/03/14 1020  04/05/14 0420 04/06/14 0010 04/07/14 0429 04/08/14 0400 04/09/14 0424  WBC 8.5  < > 6.4 6.1 5.4 5.5 6.5  NEUTROABS 6.0  --   --   --   --   --   --   HGB 11.7*  < > 11.4* 11.2* 11.3* 11.2* 11.5*  HCT 34.9*  < > 34.5* 33.7* 35.2* 34.9* 35.0*  MCV 86.6  < > 88.0 86.6 89.8 87.3 87.3  PLT 192  < > 199 220 230 234 223  < > = values in this interval not displayed. Cardiac Enzymes:  Recent Labs Lab 04/03/14 1020 04/03/14 1850 04/05/14 1312 04/05/14 1844 04/06/14 0010  TROPONINI <0.30 <0.30 <0.30 <0.30 <0.30   BNP (last 3 results)  Recent Labs  04/03/14 1056 04/05/14 1312 04/09/14 0424  PROBNP 19030.0* 5310.0* 3361.0*   CBG:  Recent Labs Lab 04/04/14 0512 04/04/14 1612 04/04/14 2104 04/05/14 0633 04/07/14 1631  GLUCAP 88 90 108* 94 96    No results found for this or any previous visit (from the past 240 hour(s)).   Studies: No results found.  Scheduled Meds: . atorvastatin  20 mg Oral Daily  . carvedilol  6.25 mg Oral BID WC  . colchicine  0.3 mg Oral Daily  . Febuxostat  1 tablet Oral Daily  . furosemide  80 mg Oral Daily  . isosorbide mononitrate  60 mg Oral Daily  . metoprolol  2.5 mg Intravenous Once  . sodium chloride  3 mL Intravenous Q12H  . sodium chloride  3 mL Intravenous Q12H  . warfarin  7.5 mg Oral ONCE-1800  . Warfarin - Pharmacist Dosing Inpatient   Does not apply q1800   Continuous Infusions: . milrinone 0.125 mcg/kg/min (04/08/14 1241)    Principal Problem:   Acute on chronic  systolic HF (heart failure) Active Problems:   Atrial fibrillation, chronic   Nonischemic cardiomyopathy   Hypertension   Anemia, normocytic normochromic   Chronic anticoagulation   Cholecystitis, acute   Acute renal failure superimposed on stage 3 chronic kidney disease   Acute on chronic systolic heart failure   Dyspnea    Time spent: 25 min    Reeya Bound, [facility] Pager [phone] If 7PM-7AM, please contact night-coverage at [email or web address], password [facility] 04/09/2014, 4:10 PM  LOS: 6 days

## 2014-04-09 NOTE — Progress Notes (Signed)
ANTICOAGULATION CONSULT NOTE - Follow-up Consult  Pharmacy Consult for coumadin Indication: atrial fibrillation  Allergies  Allergen Reactions  . Tuberculin Tests Swelling    Patient Measurements: Height: 5\' 9"  (175.3 cm) Weight: 174 lb 6.1 oz (79.1 kg) IBW/kg (Calculated) : 66.2   Vital Signs: Temp: 98 F (36.7 C) (08/09 0500) Temp src: Oral (08/09 0500) BP: 97/55 mmHg (08/09 1012) Pulse Rate: 80 (08/09 1012)  Labs:  Recent Labs  04/07/14 0429 04/07/14 1907 04/08/14 0400 04/09/14 0424  HGB 11.3*  --  11.2* 11.5*  HCT 35.2*  --  34.9* 35.0*  PLT 230  --  234 223  LABPROT 21.9*  --  19.3* 21.2*  INR 1.91*  --  1.63* 1.83*  CREATININE 1.84* 1.71* 1.86* 1.70*    Estimated Creatinine Clearance: 28.5 ml/min (by C-G formula based on Cr of 1.7).   Assessment: 80 yo female transferred from [facility] 8/3 for surgical evaluation for possible cholecystitis. Coumadin was on hold in case surgery needed. HIDA scan negative on 8/5. INR remains SUBtherapeutic but trended up to 1.83. H/H remains slightly low but stable, plt wnl with no reported s/s bleeding.  Home dose: 5mg  daily except for 7.5mg  on Thur. Last home dose 7/31.  Goal of Therapy:  Monitor platelets by anticoagulation protocol: Yes   Plan:  - Repeat increased dose of Coumadin 7.5 mg PO x 1 tonight - Continue daily INR - Monitor CBC, s/s bleeding  Vinnie Level, PharmD.  Clinical Pharmacist Pager [phone]

## 2014-04-10 LAB — BASIC METABOLIC PANEL
Anion gap: 12 (ref 5–15)
BUN: 48 mg/dL — AB (ref 6–23)
CALCIUM: 8.8 mg/dL (ref 8.4–10.5)
CO2: 25 mEq/L (ref 19–32)
Chloride: 102 mEq/L (ref 96–112)
Creatinine, Ser: 1.75 mg/dL — ABNORMAL HIGH (ref 0.50–1.10)
GFR, EST AFRICAN AMERICAN: 31 mL/min — AB (ref >90)
GFR, EST NON AFRICAN AMERICAN: 27 mL/min — AB (ref >90)
GLUCOSE: 94 mg/dL (ref 70–99)
Potassium: 4.3 mEq/L (ref 3.7–5.3)
Sodium: 139 mEq/L (ref 137–147)

## 2014-04-10 LAB — PROTIME-INR
INR: 2.28 — ABNORMAL HIGH (ref 0.00–1.49)
PROTHROMBIN TIME: 25.1 s — AB (ref 11.6–15.2)

## 2014-04-10 MED ORDER — HYDRALAZINE HCL 25 MG PO TABS
12.5000 mg | ORAL_TABLET | Freq: Three times a day (TID) | ORAL | Status: DC
  Administered 2014-04-10 – 2014-04-11 (×5): 12.5 mg via ORAL
  Filled 2014-04-10 (×10): qty 0.5

## 2014-04-10 MED ORDER — AMIODARONE HCL 200 MG PO TABS
200.0000 mg | ORAL_TABLET | Freq: Two times a day (BID) | ORAL | Status: DC
  Administered 2014-04-10 – 2014-04-21 (×23): 200 mg via ORAL
  Filled 2014-04-10 (×24): qty 1

## 2014-04-10 MED ORDER — WARFARIN SODIUM 5 MG PO TABS
5.0000 mg | ORAL_TABLET | Freq: Once | ORAL | Status: AC
  Administered 2014-04-10: 5 mg via ORAL
  Filled 2014-04-10: qty 1

## 2014-04-10 NOTE — Progress Notes (Signed)
ANTICOAGULATION CONSULT NOTE - Follow-up Consult  Pharmacy Consult for coumadin Indication: atrial fibrillation  Allergies  Allergen Reactions  . Tuberculin Tests Swelling    Patient Measurements: Height: 5\' 9"  (175.3 cm) Weight: 172 lb 6.4 oz (78.2 kg) IBW/kg (Calculated) : 66.2   Vital Signs: Temp: 98 F (36.7 C) (08/10 0549) Temp src: Oral (08/10 0549) BP: 104/67 mmHg (08/10 0549) Pulse Rate: 59 (08/10 0549)  Labs:  Recent Labs  04/08/14 0400 04/09/14 0424 04/10/14 0358  HGB 11.2* 11.5*  --   HCT 34.9* 35.0*  --   PLT 234 223  --   LABPROT 19.3* 21.2* 25.1*  INR 1.63* 1.83* 2.28*  CREATININE 1.86* 1.70* 1.75*    Estimated Creatinine Clearance: 27.7 ml/min (by C-G formula based on Cr of 1.75).   Assessment: 80 yo female transferred from [facility] 8/3 for surgical evaluation for possible cholecystitis. Coumadin was on hold in case surgery needed. HIDA scan negative on 8/5. INR now therapeutic at 2.28 after 3 days of increased dose. H/H remains slightly low but stable, plt wnl with no reported s/s bleeding.  Home dose: 5mg  daily except for 7.5mg  on Thur. Last home dose 7/31.  Goal of Therapy:  Monitor platelets by anticoagulation protocol: Yes   Plan:  - Coumadin 5 mg PO x 1 tonight - Continue daily INR - Monitor CBC, s/s bleeding  Margie Billet, PharmD Clinical Pharmacist - Resident Pager: [phone] Pharmacy: [phone] 04/10/2014 8:51 AM

## 2014-04-10 NOTE — Progress Notes (Signed)
Patient had 5 beat run of vtach at 1017.  Patient asymptomatic.  Vital signs stable.  HF team notified.

## 2014-04-10 NOTE — Progress Notes (Signed)
DAILY PROGRESS NOTE  Subjective:   Remains on milrinone. Weight and creatinine stable. Feels ok. Says she is not sure if she feels better on milrinone or not.   Objective:  Temp:  [98 F (36.7 C)-98.5 F (36.9 C)] 98 F (36.7 C) (08/10 0549) Pulse Rate:  [59-90] 59 (08/10 0549) Resp:  [16-18] 18 (08/10 0549) BP: (92-104)/(51-67) 104/67 mmHg (08/10 0549) SpO2:  [99 %-100 %] 100 % (08/10 0549) Weight:  [78.2 kg (172 lb 6.4 oz)] 78.2 kg (172 lb 6.4 oz) (08/10 0549) Weight change: -0.9 kg (-1 lb 15.7 oz)  Intake/Output from previous day: 08/09 0701 - 08/10 0700 In: 1040 [P.O.:1040] Out: 1150 [Urine:1150]  Intake/Output from this shift:    Medications: Current Facility-Administered Medications  Medication Dose Route Frequency Provider Last Rate Last Dose  . 0.9 %  sodium chloride infusion  250 mL Intravenous PRN Jolaine Artist, MD      . 0.9 %  sodium chloride infusion  250 mL Intravenous PRN Evans Lance, MD      . acetaminophen (TYLENOL) suppository 650 mg  650 mg Rectal Q6H PRN Radene Gunning, NP      . acetaminophen (TYLENOL) tablet 650 mg  650 mg Oral Q4H PRN Evans Lance, MD      . atorvastatin (LIPITOR) tablet 20 mg  20 mg Oral Daily Radene Gunning, NP   20 mg at 04/09/14 1014  . bisacodyl (DULCOLAX) suppository 10 mg  10 mg Rectal Daily PRN Radene Gunning, NP      . carvedilol (COREG) tablet 6.25 mg  6.25 mg Oral BID WC Jolaine Artist, MD   6.25 mg at 04/10/14 0755  . colchicine tablet 0.3 mg  0.3 mg Oral Daily Kelvin Cellar, MD   0.3 mg at 04/09/14 1014  . Febuxostat TABS 80 mg  1 tablet Oral Daily Lezlie Octave Black, NP   80 mg at 04/09/14 1015  . furosemide (LASIX) tablet 80 mg  80 mg Oral Daily Pixie Casino, MD   80 mg at 04/09/14 1041  . HYDROcodone-acetaminophen (NORCO) 7.5-325 MG per tablet 1 tablet  1 tablet Oral Q4H PRN Radene Gunning, NP   1 tablet at 04/07/14 1054  . isosorbide mononitrate (IMDUR) 24 hr tablet 60 mg  60 mg Oral Daily Radene Gunning, NP   60 mg at 04/09/14 1015  . metoprolol (LOPRESSOR) injection 2.5 mg  2.5 mg Intravenous Once Kathie Dike, MD      . milrinone (PRIMACOR) 20 MG/100ML (0.2 mg/mL) infusion  0.125 mcg/kg/min Intravenous Continuous Pixie Casino, MD 2.9 mL/hr at 04/09/14 2225 0.125 mcg/kg/min at 04/09/14 2225  . nitroGLYCERIN (NITROSTAT) SL tablet 0.4 mg  0.4 mg Sublingual Q5 min PRN Radene Gunning, NP      . ondansetron [facility]) injection 4 mg  4 mg Intravenous Q6H PRN Evans Lance, MD      . sodium chloride 0.9 % injection 3 mL  3 mL Intravenous Q12H Jolaine Artist, MD   3 mL at 04/10/14 0755  . sodium chloride 0.9 % injection 3 mL  3 mL Intravenous PRN Jolaine Artist, MD      . sodium chloride 0.9 % injection 3 mL  3 mL Intravenous Q12H Evans Lance, MD   3 mL at 04/10/14 0755  . sodium chloride 0.9 % injection 3 mL  3 mL Intravenous PRN Evans Lance, MD      . Warfarin -  Pharmacist Dosing Inpatient   Does not apply q1800 Sindy [place], [facility]        Physical Exam: General appearance: alert and no distress Neck: JVP 5-6  no carotid bruit and supple, symmetrical, trachea midline Lungs: diminished breath sounds bilaterally Heart: regular rate and rhythm Abdomen: soft,NT ND Extremities: warm no edema   Lab Results: Results for orders placed during the hospital encounter of 04/03/14 (from the past 48 hour(s))  PROTIME-INR     Status: Abnormal   Collection Time    04/09/14  4:24 AM      Result Value Ref Range   Prothrombin Time 21.2 (*) 11.6 - 15.2 seconds   INR 1.83 (*) 0.00 - 1.61  BASIC METABOLIC PANEL     Status: Abnormal   Collection Time    04/09/14  4:24 AM      Result Value Ref Range   Sodium 138  137 - 147 mEq/L   Potassium 3.9  3.7 - 5.3 mEq/L   Chloride 99  96 - 112 mEq/L   CO2 25  19 - 32 mEq/L   Glucose, Bld 95  70 - 99 mg/dL   BUN 45 (*) 6 - 23 mg/dL   Creatinine, Ser 1.70 (*) 0.50 - 1.10 mg/dL   Calcium 9.1  8.4 - 10.5 mg/dL   GFR calc non Af Amer 28  (*) >90 mL/min   GFR calc Af Amer 32 (*) >90 mL/min   Comment: (NOTE)     The eGFR has been calculated using the CKD EPI equation.     This calculation has not been validated in all clinical situations.     eGFR's persistently <90 mL/min signify possible Chronic Kidney     Disease.   Anion gap 14  5 - 15  PRO B NATRIURETIC PEPTIDE     Status: Abnormal   Collection Time    04/09/14  4:24 AM      Result Value Ref Range   Pro B Natriuretic peptide (BNP) 3361.0 (*) 0 - 450 pg/mL  CBC     Status: Abnormal   Collection Time    04/09/14  4:24 AM      Result Value Ref Range   WBC 6.5  4.0 - 10.5 K/uL   RBC 4.01  3.87 - 5.11 MIL/uL   Hemoglobin 11.5 (*) 12.0 - 15.0 g/dL   HCT 35.0 (*) 36.0 - 46.0 %   MCV 87.3  78.0 - 100.0 fL   MCH 28.7  26.0 - 34.0 pg   MCHC 32.9  30.0 - 36.0 g/dL   RDW 14.6  11.5 - 15.5 %   Platelets 223  150 - 400 K/uL  PROTIME-INR     Status: Abnormal   Collection Time    04/10/14  3:58 AM      Result Value Ref Range   Prothrombin Time 25.1 (*) 11.6 - 15.2 seconds   INR 2.28 (*) 0.00 - 0.96  BASIC METABOLIC PANEL     Status: Abnormal   Collection Time    04/10/14  3:58 AM      Result Value Ref Range   Sodium 139  137 - 147 mEq/L   Potassium 4.3  3.7 - 5.3 mEq/L   Chloride 102  96 - 112 mEq/L   CO2 25  19 - 32 mEq/L   Glucose, Bld 94  70 - 99 mg/dL   BUN 48 (*) 6 - 23 mg/dL   Creatinine, Ser 1.75 (*) 0.50 - 1.10 mg/dL  Calcium 8.8  8.4 - 10.5 mg/dL   GFR calc non Af Amer 27 (*) >90 mL/min   GFR calc Af Amer 31 (*) >90 mL/min   Comment: (NOTE)     The eGFR has been calculated using the CKD EPI equation.     This calculation has not been validated in all clinical situations.     eGFR's persistently <90 mL/min signify possible Chronic Kidney     Disease.   Anion gap 12  5 - 15    Imaging: No results found.  Assessment:  1. A/c systolic HF d/t NICM 2. Cardiogenic shock 3. CKD stage IV 4. Chronic AF - s/p AV node ablation 5. Frequent  PVCs  Plan:   She seems better to me on milrinone but she tells me that she is not sure if it has helped or not. She would prefer not to have PICC line if possible. Will stop milrinone today and see how she feels. If more dyspneic or bloated will place PICC and restart with plan for home milrinone.  I think restoration of sinus rhythm and abolition of PVCs may help her. Will load amio and plan DC-CV in 1 month. Continue coumadin  Carvedilol has been cut back. Restart hydralazine at 12.5 tid hold SBP < 90. No ACE due to CKD.  Will have cardiac rehab see to ambulate.    Length of Stay:  LOS: 7 days   Glori Bickers MD 04/10/2014, 8:40 AM

## 2014-04-10 NOTE — Progress Notes (Signed)
[facility] PROGRESS NOTE  [name] FGH:[id number] DOB: 1932/09/30 DOA: 04/03/2014 PCP: Brandi Hampshire, MD Interim Summary Patient is a pleasant 80 year old female with a past medical history of chronic systolic congestive heart failure having an ejection fraction of 20-25%, atrial fibrillation currently on anticoagulation who presented to the emergency room at [facility] on 04/03/2014 complaining of shortness of breath, nausea vomiting and right upper quadrant pain. Initial workup in the emergency room included a chest x-ray that showed cardiomegaly without overt failure, BNP of 19,030, Creatinine of 2.07. She was also found to have an elevated bilirubin of 3.0 and alk phos of 122. RUQ US showing thickened gallbladder. She was transferred from [facility] to [facility] for surgical evaluation. Liver enzymes today trending down. HIDA scan did not show evidence of cholecystitis or biliary obstruction.With regard to CHF she was diureses with Lasix 80 mg IV BID. Heart failure team consulted. She underwent right side heart cath.    Assessment/Plan: 2. Acute on Chronic Combine Systolic and diastolic Congestive Heart Failure -Patient presented with signs symptoms consistent with acute CHF.  -Transthoracic echocardiogram EF 15 to 20 %. Grade 3 diastolic dysfunction. -Appreciate Heart Failure team help.  -Patient S/P right side heart Cath, cardiac out put low, well compensated filling pressure.  -Patient S/P AV node ablation 8-7. -Started on Milrinone Gtt on 8-8.-- stopped 8-10.  -Lasix 80 mg PO daily.   1.Right Upper Quadrant Pain. Improved.  -Initially there was concern for a cute cholecystitis with her u/s showing thickened gallbladder wal -She was seen and evaluated by surgery as a HIDA scan did not reveal evidence of cholecystitis.  -Liver enzymes trending down. -Suspect hepatic congestion from Heart failure.    3. Stage III chronic kidney disease -Patient's  baseline creatinine near 1.7-1.9 -Cr at 1.7 today. Stable.   4. Hypokalemia -Resolved.   5. History of Chronic Atrial Fibrillation -Presently rate controlled with heart rates in the 80s to 90s -discontinue metoprolol  25 mg BID. -Continue with carvedilol dose decrease to 6.2.  -Digoxin discontinue.   6. Anticoagulation -INR at 2.28  -Pharmacy consult for warfarin dosing  Code Status: Full Code Family Communication:  Care discussed with patient.  Disposition Plan: Continue IV diuresis.    Consultants:  Surgery  Procedures:  HIDA scan Impression:  Normal hepatobiliary nuclear medicine scan demonstrating no evidence  of cholecystitis or biliary obstruction. Ultrasound findings may  have related to relative contraction of the gallbladder at the time  of imaging   HPI/Subjective: Breathing ok, abdominal fullness improved.   Objective: Filed Vitals:   04/10/14 0549  BP: 104/67  Pulse: 59  Temp: 98 F (36.7 C)  Resp: 18    Intake/Output Summary (Last 24 hours) at 04/10/14 1327 Last data filed at 04/10/14 1055  Gross per 24 hour  Intake    600 ml  Output   1375 ml  Net   -775 ml   Filed Weights   04/08/14 0545 04/09/14 0500 04/10/14 0549  Weight: 76.9 kg (169 lb 8.5 oz) 79.1 kg (174 lb 6.1 oz) 78.2 kg (172 lb 6.4 oz)    Exam:   General: Awake and alert, following commands, no acute distress, chronic ill appearing  Cardiovascular: normal S1S2,  positive JVD  Respiratory:  normal inspiratory effort, bilateral crackles.   Abdomen: Soft, nondistended, NT, no rebound tenderness  Musculoskeletal: trace edema. Upper extremities with edema.   Data Reviewed: Basic Metabolic Panel:  Recent Labs Lab 04/07/14 0429 04/07/14 1907 04/08/14  0400 04/09/14 0424 04/10/14 0358  NA 141 142 142 138 139  K 3.5* 4.2 3.9 3.9 4.3  CL 103 102 103 99 102  CO2 $Re'24 26 25 25 25  'Vca$ GLUCOSE 86 111* 95 95 94  BUN 50* 43* 49* 45* 48*  CREATININE 1.84* 1.71* 1.86* 1.70* 1.75*   CALCIUM 8.7 9.1 8.9 9.1 8.8  MG 2.1 2.1  --   --   --    Liver Function Tests:  Recent Labs Lab 04/04/14 0520  AST 37  ALT 38*  ALKPHOS 105  BILITOT 2.1*  PROT 6.4  ALBUMIN 3.1*   No results found for this basename: LIPASE, AMYLASE,  in the last 168 hours No results found for this basename: AMMONIA,  in the last 168 hours CBC:  Recent Labs Lab 04/05/14 0420 04/06/14 0010 04/07/14 0429 04/08/14 0400 04/09/14 0424  WBC 6.4 6.1 5.4 5.5 6.5  HGB 11.4* 11.2* 11.3* 11.2* 11.5*  HCT 34.5* 33.7* 35.2* 34.9* 35.0*  MCV 88.0 86.6 89.8 87.3 87.3  PLT 199 220 230 234 223   Cardiac Enzymes:  Recent Labs Lab 04/03/14 1850 04/05/14 1312 04/05/14 1844 04/06/14 0010  TROPONINI <0.30 <0.30 <0.30 <0.30   BNP (last 3 results)  Recent Labs  04/03/14 1056 04/05/14 1312 04/09/14 0424  PROBNP 19030.0* 5310.0* 3361.0*   CBG:  Recent Labs Lab 04/04/14 0512 04/04/14 1612 04/04/14 2104 04/05/14 0633 04/07/14 1631  GLUCAP 88 90 108* 94 96    No results found for this or any previous visit (from the past 240 hour(s)).   Studies: No results found.  Scheduled Meds: . amiodarone  200 mg Oral BID  . atorvastatin  20 mg Oral Daily  . carvedilol  6.25 mg Oral BID WC  . colchicine  0.3 mg Oral Daily  . Febuxostat  1 tablet Oral Daily  . furosemide  80 mg Oral Daily  . hydrALAZINE  12.5 mg Oral 3 times per day  . isosorbide mononitrate  60 mg Oral Daily  . sodium chloride  3 mL Intravenous Q12H  . sodium chloride  3 mL Intravenous Q12H  . warfarin  5 mg Oral ONCE-1800  . Warfarin - Pharmacist Dosing Inpatient   Does not apply q1800   Continuous Infusions:    Principal Problem:   Acute on chronic systolic HF (heart failure) Active Problems:   Atrial fibrillation, chronic   Nonischemic cardiomyopathy   Hypertension   Anemia, normocytic normochromic   Chronic anticoagulation   Cholecystitis, acute   Acute renal failure superimposed on stage 3 chronic kidney  disease   Acute on chronic systolic heart failure   Dyspnea    Time spent: 25 min    [name] A  [facility] Pager [phone] If 7PM-7AM, please contact night-coverage at [email or web address], password [facility] 04/10/2014, 1:27 PM  LOS: 7 days

## 2014-04-10 NOTE — Progress Notes (Signed)
Physical Therapy Treatment Patient Details Name: Brandi HackMargaret L Abebe MRN: [id number] DOB: 05/17/1933 Today's Date: 04/10/2014    History of Present Illness Pt is a 80 y/o female admitted s/p complaints of nausea, vomiting, abdominal pain. Ultrasound of right upper quadrant indicates thickened gallbladder. Pt was admitted for surgical evaluation for cholecystitis.    PT Comments    Pt progressing slowly towards physical therapy goals. Feel that pt may have an unrealistic idea of how much assist she will need at d/c, as she states she is prepared to go home and do the cooking/cleaning, etc. Feel that pt will benefit from STR at the SNF level prior to return home, as strength and tolerance for functional activity has declined since PTA. Pt was somewhat resistant to the idea of SNF at d/c, however I feel this will be a beneficial option for her in the long run.   Follow Up Recommendations  SNF;Supervision/Assistance - 24 hour     Equipment Recommendations  None recommended by PT    Recommendations for Other Services       Precautions / Restrictions Precautions Precautions: Fall Restrictions Weight Bearing Restrictions: No    Mobility  Bed Mobility Overal bed mobility: Needs Assistance Bed Mobility: Supine to Sit     Supine to sit: Supervision     General bed mobility comments: Pt able to come to full sitting position with increased time and supervision for safety.   Transfers Overall transfer level: Needs assistance Equipment used: Rolling walker (2 wheeled) Transfers: Sit to/from Stand Sit to Stand: Min guard         General transfer comment: VC's for hand placement on seated surface for safety.   Ambulation/Gait Ambulation/Gait assistance: Min guard Ambulation Distance (Feet): 75 Feet Assistive device: Rolling walker (2 wheeled) Gait Pattern/deviations: Step-through pattern;Decreased stride length;Trunk flexed Gait velocity: Decreased Gait velocity  interpretation: Below normal speed for age/gender General Gait Details: VC's for energy conservation and improved posture. Pt fatigues quickly but declines standing rest breaks.    Stairs            Wheelchair Mobility    Modified Rankin (Stroke Patients Only)       Balance Overall balance assessment: Needs assistance Sitting-balance support: Feet supported;No upper extremity supported Sitting balance-Leahy Scale: Good     Standing balance support: Bilateral upper extremity supported;During functional activity Standing balance-Leahy Scale: Poor Standing balance comment: Requires UE support for balance when standing and trying to perform dynamic activity.                     Cognition Arousal/Alertness: Awake/alert Behavior During Therapy: WFL for tasks assessed/performed Overall Cognitive Status: Within Functional Limits for tasks assessed                      Exercises      General Comments        Pertinent Vitals/Pain Pain Assessment: No/denies pain    Home Living                      Prior Function            PT Goals (current goals can now be found in the care plan section) Acute Rehab PT Goals Patient Stated Goal: To return to her home independently PT Goal Formulation: With patient/family Time For Goal Achievement: 04/11/14 Potential to Achieve Goals: Good Progress towards PT goals: Progressing toward goals    Frequency  Min 2X/week  PT Plan Discharge plan needs to be updated;Frequency needs to be updated    Co-evaluation             End of Session Equipment Utilized During Treatment: Gait belt Activity Tolerance: Patient tolerated treatment well Patient left: in chair;with call bell/phone within reach     Time: 6010-9323 PT Time Calculation (min): 25 min  Charges:  $Gait Training: 8-22 mins $Therapeutic Activity: 8-22 mins                    G Codes:      Ruthann Cancer 04/17/14, 5:02  PM  Ruthann Cancer, PT, DPT Acute Rehabilitation Services Pager: [phone]

## 2014-04-11 ENCOUNTER — Encounter: Payer: Self-pay | Admitting: *Deleted

## 2014-04-11 LAB — PROTIME-INR
INR: 2.79 — AB (ref 0.00–1.49)
Prothrombin Time: 29.4 seconds — ABNORMAL HIGH (ref 11.6–15.2)

## 2014-04-11 LAB — BASIC METABOLIC PANEL
ANION GAP: 14 (ref 5–15)
BUN: 46 mg/dL — AB (ref 6–23)
CALCIUM: 8.8 mg/dL (ref 8.4–10.5)
CHLORIDE: 101 meq/L (ref 96–112)
CO2: 25 meq/L (ref 19–32)
Creatinine, Ser: 1.71 mg/dL — ABNORMAL HIGH (ref 0.50–1.10)
GFR calc Af Amer: 32 mL/min — ABNORMAL LOW (ref >90)
GFR calc non Af Amer: 27 mL/min — ABNORMAL LOW (ref >90)
GLUCOSE: 95 mg/dL (ref 70–99)
Potassium: 3.4 mEq/L — ABNORMAL LOW (ref 3.7–5.3)
Sodium: 140 mEq/L (ref 137–147)

## 2014-04-11 MED ORDER — POTASSIUM CHLORIDE CRYS ER 20 MEQ PO TBCR
40.0000 meq | EXTENDED_RELEASE_TABLET | ORAL | Status: AC
  Administered 2014-04-11 (×2): 40 meq via ORAL

## 2014-04-11 MED ORDER — FUROSEMIDE 10 MG/ML IJ SOLN
80.0000 mg | Freq: Two times a day (BID) | INTRAMUSCULAR | Status: DC
  Administered 2014-04-11 – 2014-04-12 (×3): 80 mg via INTRAVENOUS
  Filled 2014-04-11 (×3): qty 8

## 2014-04-11 MED ORDER — CARVEDILOL 3.125 MG PO TABS
3.1250 mg | ORAL_TABLET | Freq: Two times a day (BID) | ORAL | Status: DC
  Administered 2014-04-11 – 2014-04-16 (×10): 3.125 mg via ORAL
  Filled 2014-04-11 (×14): qty 1

## 2014-04-11 MED ORDER — WARFARIN SODIUM 3 MG PO TABS
3.0000 mg | ORAL_TABLET | Freq: Once | ORAL | Status: AC
  Administered 2014-04-11: 3 mg via ORAL
  Filled 2014-04-11: qty 1

## 2014-04-11 NOTE — Progress Notes (Signed)
ANTICOAGULATION CONSULT NOTE - Follow-up Consult  Pharmacy Consult for coumadin Indication: atrial fibrillation  Allergies  Allergen Reactions  . Tuberculin Tests Swelling    Patient Measurements: Height: 5\' 9"  (175.3 cm) Weight: 176 lb 2.4 oz (79.9 kg) IBW/kg (Calculated) : 66.2   Vital Signs: Temp: 97.9 F (36.6 C) (08/11 0632) Temp src: Oral (08/11 6122) BP: 101/65 mmHg (08/11 0632) Pulse Rate: 75 (08/11 0632)  Labs:  Recent Labs  04/09/14 0424 04/10/14 0358 04/11/14 0516  HGB 11.5*  --   --   HCT 35.0*  --   --   PLT 223  --   --   LABPROT 21.2* 25.1* 29.4*  INR 1.83* 2.28* 2.79*  CREATININE 1.70* 1.75* 1.71*    Estimated Creatinine Clearance: 30.7 ml/min (by C-G formula based on Cr of 1.71).   Assessment: 80 yo female transferred from [facility] 8/3 for surgical evaluation for possible cholecystitis. Coumadin was on hold in case surgery needed. HIDA scan negative on 8/5.   INR continues to be therapeutic at 2.7 but trending up quickly. H/H stable on 8/9, plt wnl with no reported s/s bleeding.  Home dose: 5mg  daily except for 7.5mg  on Thur. Last home dose 7/31.  Goal of Therapy:  Monitor platelets by anticoagulation protocol: Yes   Plan:  - Coumadin 3mg  PO x 1 tonight - Continue daily INR - Monitor CBC, s/s bleeding  Sheppard Coil PharmD., BCPS Clinical Pharmacist Pager [phone] 04/11/2014 1:06 PM

## 2014-04-11 NOTE — Progress Notes (Signed)
DAILY PROGRESS NOTE  Subjective:   Milrinone stopped yesterday. Does not feel any different. Renal function is stable. Weight is up 4 pounds though. PT recommending SNF. This am she is dyspneic at rest  Objective:  Temp:  [97.9 F (36.6 C)-98.3 F (36.8 C)] 97.9 F (36.6 C) (08/11 3016) Pulse Rate:  [75-80] 75 (08/11 0632) Resp:  [17-18] 17 (08/11 0632) BP: (99-101)/(50-65) 101/65 mmHg (08/11 0632) SpO2:  [98 %-100 %] 100 % (08/11 0109) Weight:  [79.9 kg (176 lb 2.4 oz)] 79.9 kg (176 lb 2.4 oz) (08/11 3235) Weight change: 1.7 kg (3 lb 12 oz)  Intake/Output from previous day: 08/10 0701 - 08/11 0700 In: 820 [P.O.:820] Out: 1127 [Urine:1125; Stool:2]  Intake/Output from this shift:    Medications: Current Facility-Administered Medications  Medication Dose Route Frequency Provider Last Rate Last Dose  . 0.9 %  sodium chloride infusion  250 mL Intravenous PRN Jolaine Artist, MD      . 0.9 %  sodium chloride infusion  250 mL Intravenous PRN Evans Lance, MD      . acetaminophen (TYLENOL) suppository 650 mg  650 mg Rectal Q6H PRN Radene Gunning, NP      . acetaminophen (TYLENOL) tablet 650 mg  650 mg Oral Q4H PRN Evans Lance, MD      . amiodarone (PACERONE) tablet 200 mg  200 mg Oral BID Jolaine Artist, MD   200 mg at 04/10/14 2158  . atorvastatin (LIPITOR) tablet 20 mg  20 mg Oral Daily Radene Gunning, NP   20 mg at 04/10/14 0931  . bisacodyl (DULCOLAX) suppository 10 mg  10 mg Rectal Daily PRN Radene Gunning, NP      . carvedilol (COREG) tablet 6.25 mg  6.25 mg Oral BID WC Jolaine Artist, MD   6.25 mg at 04/10/14 1659  . colchicine tablet 0.3 mg  0.3 mg Oral Daily Kelvin Cellar, MD   0.3 mg at 04/10/14 0932  . Febuxostat TABS 80 mg  1 tablet Oral Daily Lezlie Octave Black, NP   80 mg at 04/10/14 0932  . furosemide (LASIX) tablet 80 mg  80 mg Oral Daily Pixie Casino, MD   80 mg at 04/10/14 0931  . hydrALAZINE (APRESOLINE) tablet 12.5 mg  12.5 mg Oral 3 times  per day Jolaine Artist, MD   12.5 mg at 04/11/14 0551  . HYDROcodone-acetaminophen (NORCO) 7.5-325 MG per tablet 1 tablet  1 tablet Oral Q4H PRN Radene Gunning, NP   1 tablet at 04/07/14 1054  . isosorbide mononitrate (IMDUR) 24 hr tablet 60 mg  60 mg Oral Daily Radene Gunning, NP   60 mg at 04/10/14 0931  . nitroGLYCERIN (NITROSTAT) SL tablet 0.4 mg  0.4 mg Sublingual Q5 min PRN Radene Gunning, NP      . ondansetron [facility]) injection 4 mg  4 mg Intravenous Q6H PRN Evans Lance, MD      . sodium chloride 0.9 % injection 3 mL  3 mL Intravenous Q12H Jolaine Artist, MD   3 mL at 04/10/14 0755  . sodium chloride 0.9 % injection 3 mL  3 mL Intravenous PRN Jolaine Artist, MD      . sodium chloride 0.9 % injection 3 mL  3 mL Intravenous Q12H Evans Lance, MD   3 mL at 04/10/14 2159  . sodium chloride 0.9 % injection 3 mL  3 mL Intravenous PRN Evans Lance,  MD      . Warfarin - Pharmacist Dosing Inpatient   Does not apply [place]        Physical Exam: General appearance: elderly female lying in bed  +dyspneic at res Neck: JVP jaw no carotid bruit and supple, symmetrical, trachea midline Lungs: diminished breath sounds bilaterally. Basilar crackles Heart: regular rate and rhythm Abdomen: soft,NT ND Extremities: warm no 1+ edema   Lab Results: Results for orders placed during the hospital encounter of 04/03/14 (from the past 48 hour(s))  PROTIME-INR     Status: Abnormal   Collection Time    04/10/14  3:58 AM      Result Value Ref Range   Prothrombin Time 25.1 (*) 11.6 - 15.2 seconds   INR 2.28 (*) 0.00 - 2.02  BASIC METABOLIC PANEL     Status: Abnormal   Collection Time    04/10/14  3:58 AM      Result Value Ref Range   Sodium 139  137 - 147 mEq/L   Potassium 4.3  3.7 - 5.3 mEq/L   Chloride 102  96 - 112 mEq/L   CO2 25  19 - 32 mEq/L   Glucose, Bld 94  70 - 99 mg/dL   BUN 48 (*) 6 - 23 mg/dL   Creatinine, Ser 1.75 (*) 0.50 - 1.10 mg/dL   Calcium 8.8   8.4 - 10.5 mg/dL   GFR calc non Af Amer 27 (*) >90 mL/min   GFR calc Af Amer 31 (*) >90 mL/min   Comment: (NOTE)     The eGFR has been calculated using the CKD EPI equation.     This calculation has not been validated in all clinical situations.     eGFR's persistently <90 mL/min signify possible Chronic Kidney     Disease.   Anion gap 12  5 - 15  PROTIME-INR     Status: Abnormal   Collection Time    04/11/14  5:16 AM      Result Value Ref Range   Prothrombin Time 29.4 (*) 11.6 - 15.2 seconds   INR 2.79 (*) 0.00 - 5.42  BASIC METABOLIC PANEL     Status: Abnormal   Collection Time    04/11/14  5:16 AM      Result Value Ref Range   Sodium 140  137 - 147 mEq/L   Potassium 3.4 (*) 3.7 - 5.3 mEq/L   Chloride 101  96 - 112 mEq/L   CO2 25  19 - 32 mEq/L   Glucose, Bld 95  70 - 99 mg/dL   BUN 46 (*) 6 - 23 mg/dL   Creatinine, Ser 1.71 (*) 0.50 - 1.10 mg/dL   Calcium 8.8  8.4 - 10.5 mg/dL   GFR calc non Af Amer 27 (*) >90 mL/min   GFR calc Af Amer 32 (*) >90 mL/min   Comment: (NOTE)     The eGFR has been calculated using the CKD EPI equation.     This calculation has not been validated in all clinical situations.     eGFR's persistently <90 mL/min signify possible Chronic Kidney     Disease.   Anion gap 14  5 - 15    Imaging: No results found.  Assessment:  1. A/c systolic HF d/t NICM 2. Cardiogenic shock 3. CKD stage IV 4. Chronic AF - s/p AV node ablation 5. Frequent PVCs  Plan:   She says she feels the same of milrinone but on exam  looks much worse. She is hesitant to go home on milrinone. I will resume IV diuresis today and see if we can make her better without milrinone but I think she will need it.   PT assessment reviewed and they are suggesting SNF. She is willing to consider if necessary. Wants to talk to husband.   Overall, I am concerned that she is nearing end-stage HF with severe debility. May be reasonable to discuss with Palliative if she and her husband  are open to it.   I think there is a small chance that restoration of sinus rhythm and abolition of PVCs may help her but it may not be enough. We are loading amio and tentatively planning DC-CV in 1 month. Continue coumadin  Carvedilol has been cut back. Restart hydralazine at 12.5 tid hold SBP < 90. No ACE due to CKD.   Length of Stay:  LOS: 8 days   Glori Bickers MD 04/11/2014, 8:53 AM

## 2014-04-11 NOTE — Progress Notes (Signed)
[facility] PROGRESS NOTE  [name] WCH:[id number] DOB: [birth year] DOA: 04/03/2014 PCP: Lanette Hampshire, MD Interim Summary Patient is a pleasant 80 year old female with a past medical history of chronic systolic congestive heart failure having an ejection fraction of 20-25%, atrial fibrillation currently on anticoagulation who presented to the emergency room at [facility] on 04/03/2014 complaining of shortness of breath, nausea vomiting and right upper quadrant pain. Initial workup in the emergency room included a chest x-ray that showed cardiomegaly without overt failure, BNP of 19,030, Creatinine of 2.07. She was also found to have an elevated bilirubin of 3.0 and alk phos of 122. RUQ US showing thickened gallbladder. She was transferred from [facility] to [facility] for surgical evaluation. Liver enzymes today trending down. HIDA scan did not show evidence of cholecystitis or biliary obstruction.With regard to CHF she is  Lasix 80 mg IV BID. Heart failure team consulted. She underwent right side heart cath which cardiac out put low, well compensated filling pressure. She also underwent AV node ablation. She was on Milrinone Gtt for 24 hours. Plan is to observed off milrinone, and IV lasix to see if she improved. PT is recommending SNF but she has decline this.  Disposition per Heart Failure Team.   Assessment/Plan: 2. Acute on Chronic Combine Systolic and diastolic Congestive Heart Failure -Patient presented with signs symptoms consistent with acute CHF.  -Transthoracic echocardiogram EF 15 to 20 %. Grade 3 diastolic dysfunction. -Appreciate Heart Failure team help.  -Patient S/P right side heart Cath, cardiac out put low, well compensated filling pressure.  -Patient S/P AV node ablation 8-7. -Started on Milrinone Gtt on 8-8.-- stopped 8-10.  -Worsening dyspnea, restarted on IV lasix 8-11.  -I spoke with patient today , she might agree to have PICC line place  and  Milrinone Gtt.  -She agree with Home health PT, RN.  -Dr Haroldine Laws  will discussed with patient and husband about consulting palliative care.   1.Right Upper Quadrant Pain. Improved.  -Initially there was concern for a cute cholecystitis with her u/s showing thickened gallbladder wal -She was seen and evaluated by surgery as a HIDA scan did not reveal evidence of cholecystitis.  -Liver enzymes trending down. -Suspect hepatic congestion from Heart failure.    3. Stage III chronic kidney disease -Patient's baseline creatinine near 1.7-1.9 -Cr at 1.7 . Stable.   4. Hypokalemia -Replete.   5. History of Chronic Atrial Fibrillation -Presently rate controlled with heart rates in the 80s to 90s -discontinue metoprolol  25 mg BID. -Continue with carvedilol dose decrease to 6.2.  -Digoxin discontinue.   6. Anticoagulation -INR at 2.7 -Pharmacy consult for warfarin dosing  Code Status: Full Code Family Communication:  Care discussed with patient.  Disposition Plan: Continue IV diuresis.    Consultants:  Surgery  Procedures:  HIDA scan Impression:  Normal hepatobiliary nuclear medicine scan demonstrating no evidence  of cholecystitis or biliary obstruction. Ultrasound findings may  have related to relative contraction of the gallbladder at the time  of imaging   HPI/Subjective: She appears more dyspneic today. SOB at rest.   Objective: Filed Vitals:   04/11/14 1407  BP: 116/66  Pulse: 78  Temp: 98.1 F (36.7 C)  Resp: 18    Intake/Output Summary (Last 24 hours) at 04/11/14 1718 Last data filed at 04/11/14 1500  Gross per 24 hour  Intake    560 ml  Output   2101 ml  Net  -1541 ml   [employer]  04/09/14 0500 04/10/14 0549 04/11/14 0632  Weight: 79.1 kg (174 lb 6.1 oz) 78.2 kg (172 lb 6.4 oz) 79.9 kg (176 lb 2.4 oz)    Exam:   General: Awake and alert, following commands, chronic ill appearing  Cardiovascular: normal S1S2,  positive  JVD  Respiratory:  Tachypnea, , bilateral crackles.   Abdomen: Soft, nondistended, NT, no rebound tenderness  Musculoskeletal: trace edema. Upper extremities with edema.   Data Reviewed: Basic Metabolic Panel:  Recent Labs Lab 04/07/14 0429 04/07/14 1907 04/08/14 0400 04/09/14 0424 04/10/14 0358 04/11/14 0516  NA 141 142 142 138 139 140  K 3.5* 4.2 3.9 3.9 4.3 3.4*  CL 103 102 103 99 102 101  CO2 $Re'24 26 25 25 25 25  'skh$ GLUCOSE 86 111* 95 95 94 95  BUN 50* 43* 49* 45* 48* 46*  CREATININE 1.84* 1.71* 1.86* 1.70* 1.75* 1.71*  CALCIUM 8.7 9.1 8.9 9.1 8.8 8.8  MG 2.1 2.1  --   --   --   --    Liver Function Tests: No results found for this basename: AST, ALT, ALKPHOS, BILITOT, PROT, ALBUMIN,  in the last 168 hours No results found for this basename: LIPASE, AMYLASE,  in the last 168 hours No results found for this basename: AMMONIA,  in the last 168 hours CBC:  Recent Labs Lab 04/05/14 0420 04/06/14 0010 04/07/14 0429 04/08/14 0400 04/09/14 0424  WBC 6.4 6.1 5.4 5.5 6.5  HGB 11.4* 11.2* 11.3* 11.2* 11.5*  HCT 34.5* 33.7* 35.2* 34.9* 35.0*  MCV 88.0 86.6 89.8 87.3 87.3  PLT 199 220 230 234 223   Cardiac Enzymes:  Recent Labs Lab 04/05/14 1312 04/05/14 1844 04/06/14 0010  TROPONINI <0.30 <0.30 <0.30   BNP (last 3 results)  Recent Labs  04/03/14 1056 04/05/14 1312 04/09/14 0424  PROBNP 19030.0* 5310.0* 3361.0*   CBG:  Recent Labs Lab 04/04/14 2104 04/05/14 0633 04/07/14 1631  GLUCAP 108* 94 96    No results found for this or any previous visit (from the past 240 hour(s)).   Studies: No results found.  Scheduled Meds: . amiodarone  200 mg Oral BID  . atorvastatin  20 mg Oral Daily  . carvedilol  3.125 mg Oral BID WC  . colchicine  0.3 mg Oral Daily  . Febuxostat  1 tablet Oral Daily  . furosemide  80 mg Intravenous BID  . hydrALAZINE  12.5 mg Oral 3 times per day  . isosorbide mononitrate  60 mg Oral Daily  . sodium chloride  3 mL  Intravenous Q12H  . sodium chloride  3 mL Intravenous Q12H  . warfarin  3 mg Oral ONCE-1800  . Warfarin - Pharmacist Dosing Inpatient   Does not apply q1800   Continuous Infusions:    Principal Problem:   Acute on chronic systolic HF (heart failure) Active Problems:   Atrial fibrillation, chronic   Nonischemic cardiomyopathy   Hypertension   Anemia, normocytic normochromic   Chronic anticoagulation   Cholecystitis, acute   Acute renal failure superimposed on stage 3 chronic kidney disease   Acute on chronic systolic heart failure   Dyspnea    Time spent: 25 min    Io Dieujuste A  [facility] Pager [phone] If 7PM-7AM, please contact night-coverage at [email or web address], password [facility] 04/11/2014, 5:18 PM  LOS: 8 days

## 2014-04-11 NOTE — Progress Notes (Signed)
Patient slept throughout the night.  No signs or symptoms of distress.  Output well. Will continue to monitor and give shift report to incoming nurse.

## 2014-04-12 DIAGNOSIS — I10 Essential (primary) hypertension: Secondary | ICD-10-CM

## 2014-04-12 LAB — BASIC METABOLIC PANEL
Anion gap: 14 (ref 5–15)
BUN: 40 mg/dL — AB (ref 6–23)
CHLORIDE: 102 meq/L (ref 96–112)
CO2: 25 meq/L (ref 19–32)
Calcium: 8.9 mg/dL (ref 8.4–10.5)
Creatinine, Ser: 1.89 mg/dL — ABNORMAL HIGH (ref 0.50–1.10)
GFR calc Af Amer: 28 mL/min — ABNORMAL LOW (ref >90)
GFR, EST NON AFRICAN AMERICAN: 24 mL/min — AB (ref >90)
GLUCOSE: 91 mg/dL (ref 70–99)
Potassium: 3.8 mEq/L (ref 3.7–5.3)
SODIUM: 141 meq/L (ref 137–147)

## 2014-04-12 LAB — PROTIME-INR
INR: 2.92 — ABNORMAL HIGH (ref 0.00–1.49)
Prothrombin Time: 30.5 seconds — ABNORMAL HIGH (ref 11.6–15.2)

## 2014-04-12 MED ORDER — HYDRALAZINE HCL 25 MG PO TABS
25.0000 mg | ORAL_TABLET | Freq: Three times a day (TID) | ORAL | Status: DC
  Administered 2014-04-12 – 2014-04-21 (×27): 25 mg via ORAL
  Filled 2014-04-12 (×31): qty 1

## 2014-04-12 NOTE — Progress Notes (Signed)
Physical Therapy Treatment Patient Details Name: [name] MRN: [id number] DOB: 07/02/1933 Today's Date: 04/12/2014    History of Present Illness Pt is a 80 y/o female admitted s/p complaints of nausea, vomiting, abdominal pain. Ultrasound of right upper quadrant indicates thickened gallbladder. Pt was admitted for surgical evaluation for cholecystitis.    PT Comments    Pt making slow but steady progress with all aspects of mobility.  Continues to demonstrate DOE and decreased activity tolerance.  Discussed energy conservation techniques, which pt states she does at home.  Also discussed D/C plan as it was noted in chart that pt does not wish to D/C to SNF.  PT discussed that it would be safer and pt would return home more independently if attending SNF for at least a week or so.  Pt somewhat more agreeable and states "I'll do whatever the doctor tells me is best."    Follow Up Recommendations  SNF;Supervision/Assistance - 24 hour     Equipment Recommendations  Rolling walker with 5" wheels    Recommendations for Other Services       Precautions / Restrictions Precautions Precautions: Fall Restrictions Weight Bearing Restrictions: No    Mobility  Bed Mobility Overal bed mobility: Modified Independent Bed Mobility: Supine to Sit Rolling: Modified independent (Device/Increase time)         General bed mobility comments: Pt able to get to EOB this morning with increased time but without use of rails and HOB flat  Transfers Overall transfer level: Needs assistance Equipment used: Rolling walker (2 wheeled) Transfers: Sit to/from Stand Sit to Stand: Min guard         General transfer comment: Min cues for hand placement and safety with and without use of RW  Ambulation/Gait Ambulation/Gait assistance: Min assist;Supervision Ambulation Distance (Feet): 20 Feet (and another 60) Assistive device: Rolling walker (2 wheeled);1 person hand held assist Gait  Pattern/deviations: Step-through pattern;Decreased stride length;Antalgic;Trunk flexed (issues with arthritis in back and LEs) Gait velocity: Decreased   General Gait Details: Assessed pts gait with and without AD.  Initially used only HHA  with pts R hand to simulate use of cane at home.  Pt requires min A for balance with increased forward flexed posture and increased energy expediture.  Then assessed with RW and note marked impovement in balance, however continues to have DOE 2/3 and forward flexed posture.  Pts SaO2 on RA was 90% to 94%.    Stairs            Wheelchair Mobility    Modified Rankin (Stroke Patients Only)       Balance Overall balance assessment: Needs assistance Sitting-balance support: Feet supported;No upper extremity supported Sitting balance-Leahy Scale: Good     Standing balance support: During functional activity;Single extremity supported Standing balance-Leahy Scale: Poor Standing balance comment: Pt requires up to min A and at least single UE support to maintain balance throughout session.                     Cognition Arousal/Alertness: Awake/alert Behavior During Therapy: WFL for tasks assessed/performed Overall Cognitive Status: Within Functional Limits for tasks assessed                      Exercises      General Comments        Pertinent Vitals/Pain Pain Assessment: No/denies pain    Home Living  Prior Function            PT Goals (current goals can now be found in the care plan section) Acute Rehab PT Goals Patient Stated Goal: To return to her home independently PT Goal Formulation: With patient/family Time For Goal Achievement: 04/19/14 Potential to Achieve Goals: Good Progress towards PT goals: Progressing toward goals    Frequency  Min 2X/week    PT Plan Current plan remains appropriate    Co-evaluation             End of Session Equipment Utilized During  Treatment: Gait belt Activity Tolerance: Patient tolerated treatment well Patient left: in chair;with call bell/phone within reach     Time: 0923-0952 PT Time Calculation (min): 29 min  Charges:  $Gait Training: 23-37 mins                    G Codes:      Vista Deck 04/12/2014, 9:57 AM

## 2014-04-12 NOTE — Progress Notes (Signed)
DAILY PROGRESS NOTE  Subjective:   Milrinone stopped Monday. Yesterday more dyspneic. IV lasix restarted.   Weight down 2 pounds. Labs pending. Breathing much better. PT recommending SNF.   Objective:  Temp:  [97.3 F (36.3 C)-98.7 F (37.1 C)] 97.3 F (36.3 C) (08/12 0521) Pulse Rate:  [72-78] 74 (08/12 0521) Resp:  [17-18] 17 (08/12 0521) BP: (101-116)/(56-72) 104/65 mmHg (08/12 0521) SpO2:  [96 %-100 %] 100 % (08/12 0521) Weight:  [79.2 kg (174 lb 9.7 oz)-79.9 kg (176 lb 2.4 oz)] 79.2 kg (174 lb 9.7 oz) (08/12 0521) Weight change: -0.7 kg (-1 lb 8.7 oz)  Intake/Output from previous day: 08/11 0701 - 08/12 0700 In: 620 [P.O.:620] Out: 2250 [Urine:2250]  Intake/Output from this shift: Total I/O In: 60 [P.O.:60] Out: 750 [Urine:750]  Medications: Current Facility-Administered Medications  Medication Dose Route Frequency Provider Last Rate Last Dose  . 0.9 %  sodium chloride infusion  250 mL Intravenous PRN Jolaine Artist, MD      . 0.9 %  sodium chloride infusion  250 mL Intravenous PRN Evans Lance, MD      . acetaminophen (TYLENOL) suppository 650 mg  650 mg Rectal Q6H PRN Radene Gunning, NP      . acetaminophen (TYLENOL) tablet 650 mg  650 mg Oral Q4H PRN Evans Lance, MD      . amiodarone (PACERONE) tablet 200 mg  200 mg Oral BID Jolaine Artist, MD   200 mg at 04/11/14 2136  . atorvastatin (LIPITOR) tablet 20 mg  20 mg Oral Daily Lezlie Octave Black, NP   20 mg at 04/11/14 1014  . bisacodyl (DULCOLAX) suppository 10 mg  10 mg Rectal Daily PRN Radene Gunning, NP      . carvedilol (COREG) tablet 3.125 mg  3.125 mg Oral BID WC Jolaine Artist, MD   3.125 mg at 04/11/14 1809  . colchicine tablet 0.3 mg  0.3 mg Oral Daily Kelvin Cellar, MD   0.3 mg at 04/11/14 1015  . Febuxostat TABS 80 mg  1 tablet Oral Daily Lezlie Octave Black, NP   80 mg at 04/11/14 1014  . furosemide (LASIX) injection 80 mg  80 mg Intravenous BID Jolaine Artist, MD   80 mg at 04/11/14 1809   . hydrALAZINE (APRESOLINE) tablet 12.5 mg  12.5 mg Oral 3 times per day Jolaine Artist, MD   12.5 mg at 04/11/14 2137  . HYDROcodone-acetaminophen (NORCO) 7.5-325 MG per tablet 1 tablet  1 tablet Oral Q4H PRN Radene Gunning, NP   1 tablet at 04/07/14 1054  . isosorbide mononitrate (IMDUR) 24 hr tablet 60 mg  60 mg Oral Daily Radene Gunning, NP   60 mg at 04/11/14 1014  . nitroGLYCERIN (NITROSTAT) SL tablet 0.4 mg  0.4 mg Sublingual Q5 min PRN Radene Gunning, NP      . ondansetron [facility]) injection 4 mg  4 mg Intravenous Q6H PRN Evans Lance, MD      . sodium chloride 0.9 % injection 3 mL  3 mL Intravenous Q12H Jolaine Artist, MD   3 mL at 04/11/14 1000  . sodium chloride 0.9 % injection 3 mL  3 mL Intravenous PRN Jolaine Artist, MD      . sodium chloride 0.9 % injection 3 mL  3 mL Intravenous Q12H Evans Lance, MD   3 mL at 04/11/14 2137  . sodium chloride 0.9 % injection 3 mL  3  mL Intravenous PRN Evans Lance, MD      . Warfarin - Pharmacist Dosing Inpatient   Does not apply [place]        Physical Exam: General appearance: elderly female lying in bed NAD Neck: JVP 9 no carotid bruit and supple, symmetrical, trachea midline Lungs: diminished breath sounds bilaterally. Basilar crackles Heart: regular rate and rhythm Abdomen: soft,NT ND Extremities: warm no tr edema   Lab Results: Results for orders placed during the hospital encounter of 04/03/14 (from the past 48 hour(s))  PROTIME-INR     Status: Abnormal   Collection Time    04/11/14  5:16 AM      Result Value Ref Range   Prothrombin Time 29.4 (*) 11.6 - 15.2 seconds   INR 2.79 (*) 0.00 - 4.74  BASIC METABOLIC PANEL     Status: Abnormal   Collection Time    04/11/14  5:16 AM      Result Value Ref Range   Sodium 140  137 - 147 mEq/L   Potassium 3.4 (*) 3.7 - 5.3 mEq/L   Chloride 101  96 - 112 mEq/L   CO2 25  19 - 32 mEq/L   Glucose, Bld 95  70 - 99 mg/dL   BUN 46 (*) 6 - 23 mg/dL    Creatinine, Ser 1.71 (*) 0.50 - 1.10 mg/dL   Calcium 8.8  8.4 - 10.5 mg/dL   GFR calc non Af Amer 27 (*) >90 mL/min   GFR calc Af Amer 32 (*) >90 mL/min   Comment: (NOTE)     The eGFR has been calculated using the CKD EPI equation.     This calculation has not been validated in all clinical situations.     eGFR's persistently <90 mL/min signify possible Chronic Kidney     Disease.   Anion gap 14  5 - 15    Imaging: No results found.  Assessment:  1. A/c systolic HF d/t NICM 2. Cardiogenic shock 3. CKD stage IV 4. Chronic AF - s/p AV node ablation 5. Frequent PVCs  Plan:   She is better with diuresis. She remains hesitant to go home on milrinone. Will continue with IV diuresis one more day. If renal function ok and we are able to manage volume with milrinone we will do so. Otherwise we will need to reconsider.  PT assessment reviewed and they are suggesting SNF. She prefers to go home but may consider.   Overall, I am concerned that she is nearing end-stage HF with severe debility.  I think there is a small chance that restoration of sinus rhythm and abolition of PVCs may help her but it may not be enough. We are loading amio and tentatively planning DC-CV in 1 month. Continue coumadin  Carvedilol has been cut back. titrate hydralazine to 25 tid hold SBP < 90. No ACE due to CKD.   Length of Stay:  LOS: 9 days   Glori Bickers MD 04/12/2014, 5:45 AM

## 2014-04-12 NOTE — Progress Notes (Signed)
[facility] PROGRESS NOTE  [name] WYO:[id number] DOB: [birth year] DOA: 04/03/2014  PCP: Lanette Hampshire, MD  Interim Summary Patient is a pleasant 80 year old female with a past medical history of chronic systolic congestive heart failure having an ejection fraction of 20-25%, atrial fibrillation currently on anticoagulation who presented to the emergency room at [facility] on 04/03/2014 complaining of shortness of breath, nausea vomiting and right upper quadrant pain. Initial workup in the emergency room included a chest x-ray that showed cardiomegaly without overt failure, BNP of 19,030, Creatinine of 2.07. She was also found to have an elevated bilirubin of 3.0 and alk phos of 122. RUQ US showing thickened gallbladder. She was transferred from [facility] to [facility] for surgical evaluation. Liver enzymes today trending down. HIDA scan did not show evidence of cholecystitis or biliary obstruction. Heart failure team consulted. She underwent right side heart cath which cardiac out put low, well compensated filling pressure. She also underwent AV node ablation. She was on Milrinone Gtt for 24 hours. Plan is to observed off milrinone, and IV lasix to see if she improved.   Assessment/Plan: 1. Acute on Chronic Combine Systolic and diastolic Congestive Heart Failure -Patient presented with signs symptoms consistent with acute CHF.  -Transthoracic echocardiogram EF 15 to 20 %. Grade 3 diastolic dysfunction. -Patient S/P right side heart Cath, cardiac out put low, well compensated filling pressure.  -Patient S/P AV node ablation 8-7. -Started on Milrinone Gtt on 8-8.-- stopped 8-10.  -Due to Worsening dyspnea, restarted on IV lasix 8-11.   2. Right Upper Quadrant Pain.  Improved.  -Initially there was concern for a cute cholecystitis with her u/s showing thickened gallbladder wal -She was seen and evaluated by surgery as a HIDA scan did not reveal evidence of  cholecystitis.  -Liver enzymes trending down. -Suspect hepatic congestion from Heart failure.   3. Stage III chronic kidney disease -Patient's baseline creatinine near 1.7-1.9 Monitor daily while being diuresed.   4. Hypokalemia -Repleted.   5. History of Chronic Atrial Fibrillation -Presently rate controlled with heart rates in the 80s to 90s -Continue Carvedilol and Amiodarone -Digoxin discontinued.   6. Anticoagulation with Warfarin -INR at 2.7 -Pharmacy consult for warfarin dosing  DVT Prophylaxis: On warfarin Code Status: Full Code Family Communication:  Care discussed with patient. Patient wants to think again about SNF. Agreeable to talk to CSW about SNF placement. Disposition Plan: On IV diuresis for now. Heart failure team is primarily managing.   Consultants:  Surgery  Heart Failure  Procedures:  HIDA scan Impression:  Normal hepatobiliary nuclear medicine scan demonstrating no evidence  of cholecystitis or biliary obstruction. Ultrasound findings may  have related to relative contraction of the gallbladder at the time  of imaging   HPI/Subjective: She feels better. No chest pain. States she is close to how she is at home.  Objective: Filed Vitals:   04/12/14 0852  BP: 101/64  Pulse: 81  Temp: 97.4 F (36.3 C)  Resp: 20    Intake/Output Summary (Last 24 hours) at 04/12/14 1224 Last data filed at 04/12/14 1057  Gross per 24 hour  Intake    785 ml  Output   1850 ml  Net  -1065 ml   Filed Weights   04/10/14 0549 04/11/14 0632 04/12/14 0521  Weight: 78.2 kg (172 lb 6.4 oz) 79.9 kg (176 lb 2.4 oz) 79.2 kg (174 lb 9.7 oz)    Exam:   General: Awake and alert, No distress  Cardiovascular: normal S1S2,  positive JVD. Systolic murmur over precordium  Respiratory:  Decreased air entry over bases. Few bilateral crackles.   Abdomen: Soft, nondistended, NT, no rebound tenderness  Musculoskeletal: trace edema. Upper extremities with edema.    Data Reviewed: Basic Metabolic Panel:  Recent Labs Lab 04/07/14 0429 04/07/14 1907 04/08/14 0400 04/09/14 0424 04/10/14 0358 04/11/14 0516 04/12/14 0550  NA 141 142 142 138 139 140 141  K 3.5* 4.2 3.9 3.9 4.3 3.4* 3.8  CL 103 102 103 99 102 101 102  CO2 _0 GLUCOSE 86 111* 95 95 94 95 91  BUN 50* 43* 49* 45* 48* 46* 40*  CREATININE 1.84* 1.71* 1.86* 1.70* 1.75* 1.71* 1.89*  CALCIUM 8.7 9.1 8.9 9.1 8.8 8.8 8.9  MG 2.1 2.1  --   --   --   --   --    CBC:  Recent Labs Lab 04/06/14 0010 04/07/14 0429 04/08/14 0400 04/09/14 0424  WBC 6.1 5.4 5.5 6.5  HGB 11.2* 11.3* 11.2* 11.5*  HCT 33.7* 35.2* 34.9* 35.0*  MCV 86.6 89.8 87.3 87.3  PLT 220 230 234 223   Cardiac Enzymes:  Recent Labs Lab 04/05/14 1312 04/05/14 1844 04/06/14 0010  TROPONINI <0.30 <0.30 <0.30   BNP (last 3 results)  Recent Labs  04/03/14 1056 04/05/14 1312 04/09/14 0424  PROBNP 19030.0* 5310.0* 3361.0*   CBG:  Recent Labs Lab 04/07/14 1631  GLUCAP 96    Studies: No results found.  Scheduled Meds: . amiodarone  200 mg Oral BID  . atorvastatin  20 mg Oral Daily  . carvedilol  3.125 mg Oral BID WC  . colchicine  0.3 mg Oral Daily  . Febuxostat  1 tablet Oral Daily  . hydrALAZINE  25 mg Oral 3 times per day  . isosorbide mononitrate  60 mg Oral Daily  . sodium chloride  3 mL Intravenous Q12H  . sodium chloride  3 mL Intravenous Q12H  . Warfarin - Pharmacist Dosing Inpatient   Does not apply q1800   Continuous Infusions:    Principal Problem:   Acute on chronic systolic HF (heart failure) Active Problems:   Atrial fibrillation, chronic   Nonischemic cardiomyopathy   Hypertension   Anemia, normocytic normochromic   Chronic anticoagulation   Cholecystitis, acute   Acute renal failure superimposed on stage 3 chronic kidney disease   Acute on chronic systolic heart failure   Dyspnea   Time spent: 25 min   [facility] Pager  [phone]   If 7PM-7AM, please contact night-coverage at [email or web address], password [facility] 04/12/2014, 12:24 PM  LOS: 9 days

## 2014-04-12 NOTE — Progress Notes (Signed)
Heart Failure Navigator Consult Note  Presentation: [name] is a very pleasant 80 y.o. female with a past medical history that includes nonischemic cardiomyopathy status post insertion of new biventricular ICD with defibrillation testing in April, hypertension, chronic systolic heart failure, A. Fib, chronic kidney disease presents to the emergency room with the chief complaint of 2 day history of persistent nausea vomiting associated with worsening shortness of breath. Initial evaluation in the emergency department reveals acute on chronic systolic heart failure and Thickened edematous gallbladder wall without definitive gallstones, total bilirubin of 3.8 proBNP of 19,003 and acute on chronic renal failure.  Patient reports having traveled to [place] over the past weekend. 2 days ago she developed sudden onset of nausea and decreased appetite. She states the nausea persisted and progressed to some vomiting. She has been unable to tolerate by mouth nourishment over the last day and a half. Yesterday she developed worsening shortness of breath and had 2 episodes of diarrhea. She denies chest pain palpitation. She denies abdominal pain. She denies lower extremity edema or orthopnea. She does not wear oxygen at home. She denies any fever chills or recent sick contacts. She denies dysuria hematuria frequency or urgency.  Workup in the emergency department significant for a creatinine of 2.7, alkaline phosphatase 122 AST 51 ALT 46 total bili 3.8. ProBNP 19,030, lactic acid 1.7. Chest x-ray Cardiomegaly. Small right pleural effusion. No overt failure. , ultrasound as above. EKG with afib. She received lasix 80mg  IV in ED. The time my exam she is hemodynamically stable afebrile and nontoxic appearing. General surgery consulted at [facility] and recommended transfer.   Past Medical History  Diagnosis Date  . Nonischemic cardiomyopathy     Initial dx 1992; nl coronary angios  in 1/03; AICD/bivent.  pacemaker - 4/09; EF 20% in '92, 15-20% in '02, 20-25% in '08, 40-45% in 6/10.; digoxin toxicity in 2009; ACE Inhibitor discontinued in 2010 during hospital admission  for low output syndrome  . Hypertension     04/2011-normal electrolytes  . Chronic kidney disease, stage II (mild)     creatinine of 1.4 - 3/08, 2.1 - 2/09, 1.55 - 3/10, 1.4 - 7/10; 1.63 in 04/2011  . PSVT (paroxysmal supraventricular tachycardia)     s/p radiofrequency ablation  . Gout     uric acid of 10 6/11  . Anemia     hemoglobin of 11/33 in 11/09; 11/34.6 with MCV of 89 in 10/2010  . Thrombus     left upper extremity; following device insertion  . Chronic anticoagulation     Hemoccult-negative in 10/2010  . Coronary artery disease   . CHF (congestive heart failure)   . Automatic implantable cardioverter-defibrillator in situ   . History of blood transfusion 1959  . Hepatitis 1962    "don't know which kind"  . CVA (cerebral vascular accident) 1992    "memory not always clear since"  . DJD (degenerative joint disease)     knees; right hip  . Arthritis     "knees, back" (04/03/2014)  . Chronic mid back pain     History   Social History  . Marital Status: Married    Spouse Name: N/A    Number of Children: N/A  . Years of Education: N/A   Social History Main Topics  . Smoking status: Never Smoker   . Smokeless tobacco: Never Used  . Alcohol Use: No  . Drug Use: No  . Sexual Activity: No   Other Topics Concern  .  None   Social History Narrative   Married, retired, does not get regular exercise, no caffeine.     ECHO:Study Conclusions--04/04/14  - Left ventricle: Severe global hypokinesis and paradoxical septal motion. The cavity size was severely dilated. Systolic function was severely reduced. The estimated ejection fraction was in the range of 15-20%. Doppler parameters are consistent with a restrictive pattern, indicative of decreased left ventricular diastolic compliance and/or increased left  atrial pressure (grade 3 diastolic dysfunction). Doppler parameters are consistent with elevated ventricular end-diastolic filling pressure. - Aortic valve: Trileaflet; normal thickness leaflets. There was no regurgitation. - Mitral valve: There was severe regurgitation directed posteriorly with systolic flow reversal in the pulmonary veins. - Left atrium: The atrium was moderately to severely dilated. - Right atrium: The atrium was moderately dilated. - Tricuspid valve: There was moderate regurgitation. - Pulmonary arteries: Systolic pressure was mildly increased. PA peak pressure: 41 mm Hg (S).  Impressions:  - No significant change when compared to the study from 04/05/2013   BNP    Component Value Date/Time   PROBNP 3361.0* 04/09/2014 0424    Education Assessment and Provision:  Detailed education and instructions provided on heart failure disease management including the following:  Signs and symptoms of Heart Failure When to call the physician Importance of daily weights Low sodium diet Fluid restriction Medication management Anticipated future follow-up appointments  Patient education given on each of the above topics.  Patient acknowledges understanding and acceptance of all instructions.  [name] lives with her husband.  She reports being fairly active at home previously.  She "cooks and feeds and waters chickens".  She says that at this time [facility] is [facility] nor going home with "IV medicine".  She admits that she does not weigh daily--only "sometimes".  She tries to eat low sodium--however neighbors sometimes provide meals and when her husband feels up to it--he goes to "pick something up".  I reinforced that eating out makes sodium control difficult.  She does say that she takes medications as prescribed at home.    Education Materials:  "Living Better With Heart Failure" Booklet, Daily Weight Tracker Tool and Heart Failure Educational Video.   High  Risk Criteria for Readmission and/or Poor Patient Outcomes:  (Recommend Follow-up with Advanced Heart Failure Clinic)--yes   EF <30%-15-20%--yes  2 or more admissions in 6 months- no  Difficult social situation- no  Demonstrates medication noncompliance- no    Barriers of Care: Knowledge and ability to remain compliant  Discharge Planning:   Plans to discharge to home with husband--Will follow-up with AHF clinic

## 2014-04-12 NOTE — Progress Notes (Signed)
ANTICOAGULATION CONSULT NOTE - Follow-up Consult  Pharmacy Consult for coumadin Indication: atrial fibrillation  Allergies  Allergen Reactions  . Tuberculin Tests Swelling    Patient Measurements: Height: 5\' 9"  (175.3 cm) Weight: 174 lb 9.7 oz (79.2 kg) IBW/kg (Calculated) : 66.2   Vital Signs: Temp: 97.4 F (36.3 C) (08/12 0852) Temp src: Oral (08/12 0852) BP: 101/64 mmHg (08/12 0852) Pulse Rate: 81 (08/12 0852)  Labs:  Recent Labs  04/10/14 0358 04/11/14 0516 04/12/14 0550  LABPROT 25.1* 29.4* 30.5*  INR 2.28* 2.79* 2.92*  CREATININE 1.75* 1.71* 1.89*    Estimated Creatinine Clearance: 25.6 ml/min (by C-G formula based on Cr of 1.89).   Assessment: 80 yo female transferred from [facility] 8/3 for surgical evaluation for possible cholecystitis. Coumadin was on hold in case surgery needed. HIDA scan negative on 8/5.  INR continues to be therapeutic at 2.92 but trending up quickly. H/H stable on 8/9, plt wnl with no reported s/s bleeding.  Home dose: 5mg  daily except for 7.5mg  on Thur. Last home dose 7/31.  Goal of Therapy:  Monitor platelets by anticoagulation protocol: Yes   Plan:  - Hold Coumadin tonight - Continue daily INR - Monitor CBC, s/s bleeding  Margie Billet, PharmD Clinical Pharmacist - Resident Pager: [phone] Pharmacy: [phone] 04/12/2014 9:34 AM

## 2014-04-13 DIAGNOSIS — R7401 Elevation of levels of liver transaminase levels: Secondary | ICD-10-CM

## 2014-04-13 DIAGNOSIS — R740 Nonspecific elevation of levels of transaminase and lactic acid dehydrogenase [LDH]: Secondary | ICD-10-CM

## 2014-04-13 LAB — PROTIME-INR
INR: 2.86 — ABNORMAL HIGH (ref 0.00–1.49)
Prothrombin Time: 30 seconds — ABNORMAL HIGH (ref 11.6–15.2)

## 2014-04-13 LAB — CBC
HEMATOCRIT: 33 % — AB (ref 36.0–46.0)
HEMOGLOBIN: 10.7 g/dL — AB (ref 12.0–15.0)
MCH: 29 pg (ref 26.0–34.0)
MCHC: 32.4 g/dL (ref 30.0–36.0)
MCV: 89.4 fL (ref 78.0–100.0)
Platelets: 223 10*3/uL (ref 150–400)
RBC: 3.69 MIL/uL — ABNORMAL LOW (ref 3.87–5.11)
RDW: 14.5 % (ref 11.5–15.5)
WBC: 5.2 10*3/uL (ref 4.0–10.5)

## 2014-04-13 LAB — BASIC METABOLIC PANEL
ANION GAP: 13 (ref 5–15)
BUN: 43 mg/dL — ABNORMAL HIGH (ref 6–23)
CHLORIDE: 102 meq/L (ref 96–112)
CO2: 24 meq/L (ref 19–32)
CREATININE: 1.81 mg/dL — AB (ref 0.50–1.10)
Calcium: 8.6 mg/dL (ref 8.4–10.5)
GFR calc non Af Amer: 26 mL/min — ABNORMAL LOW (ref >90)
GFR, EST AFRICAN AMERICAN: 30 mL/min — AB (ref >90)
Glucose, Bld: 89 mg/dL (ref 70–99)
POTASSIUM: 3.4 meq/L — AB (ref 3.7–5.3)
SODIUM: 139 meq/L (ref 137–147)

## 2014-04-13 MED ORDER — WARFARIN SODIUM 5 MG PO TABS
5.0000 mg | ORAL_TABLET | Freq: Once | ORAL | Status: AC
  Administered 2014-04-13: 5 mg via ORAL
  Filled 2014-04-13: qty 1

## 2014-04-13 MED ORDER — FUROSEMIDE 80 MG PO TABS
80.0000 mg | ORAL_TABLET | Freq: Every day | ORAL | Status: DC
  Administered 2014-04-14: 80 mg via ORAL
  Filled 2014-04-13: qty 1

## 2014-04-13 MED ORDER — POTASSIUM CHLORIDE CRYS ER 20 MEQ PO TBCR
20.0000 meq | EXTENDED_RELEASE_TABLET | Freq: Once | ORAL | Status: AC
  Administered 2014-04-13: 20 meq via ORAL
  Filled 2014-04-13: qty 1

## 2014-04-13 MED ORDER — FUROSEMIDE 40 MG PO TABS
40.0000 mg | ORAL_TABLET | Freq: Every evening | ORAL | Status: DC
  Administered 2014-04-13: 40 mg via ORAL
  Filled 2014-04-13 (×2): qty 1

## 2014-04-13 MED ORDER — FUROSEMIDE 80 MG PO TABS
80.0000 mg | ORAL_TABLET | Freq: Two times a day (BID) | ORAL | Status: DC
  Administered 2014-04-13: 80 mg via ORAL
  Filled 2014-04-13 (×3): qty 1

## 2014-04-13 NOTE — Progress Notes (Signed)
ANTICOAGULATION CONSULT NOTE - Follow-up Consult  Pharmacy Consult for coumadin Indication: atrial fibrillation  Allergies  Allergen Reactions  . Tuberculin Tests Swelling    Patient Measurements: Height: 5\' 7"  (170.2 cm) Weight: 174 lb 8 oz (79.153 kg) (scale c) IBW/kg (Calculated) : 61.6   Vital Signs: Temp: 97.8 F (36.6 C) (08/13 0555) Temp src: Oral (08/13 0555) BP: 102/50 mmHg (08/13 0900) Pulse Rate: 78 (08/13 0555)  Labs:  Recent Labs  04/11/14 0516 04/12/14 0550 04/13/14 0530  HGB  --   --  10.7*  HCT  --   --  33.0*  PLT  --   --  223  LABPROT 29.4* 30.5* 30.0*  INR 2.79* 2.92* 2.86*  CREATININE 1.71* 1.89* 1.81*    Estimated Creatinine Clearance: 27.7 ml/min (by C-G formula based on Cr of 1.81).   Assessment: 80 yo female transferred from [facility] 8/3 for surgical evaluation for possible cholecystitis. Coumadin was on hold in case surgery needed. HIDA scan negative on 8/5.  INR continues to be therapeutic at 2.86 and has trended down slightly since dose held yesterday for quick increase in INR. Of note, patient was started on amiodarone on 8/10 which may potentiate the effects of Coumadin so may require lower dose. H/H low but relatively stable, plt wnl and stable with no reported s/s bleeding.  Home dose: 5mg  daily except for 7.5mg  on Thur. Last home dose 7/31.  Goal of Therapy:  Monitor platelets by anticoagulation protocol: Yes   Plan:  - Coumadin 5 mg PO x 1 tonight - Continue daily INR - Monitor CBC, s/s bleeding  Margie Billet, PharmD Clinical Pharmacist - Resident Pager: [phone] Pharmacy: [phone] 04/13/2014 11:33 AM

## 2014-04-13 NOTE — Progress Notes (Signed)
The patient did not receive any PRN medications overnight and did not have any complaints of pain or otherwise during the shift.  She is A&Ox4 and called out for assistance to the bathroom.

## 2014-04-13 NOTE — Progress Notes (Signed)
[facility] PROGRESS NOTE  [name] [name] DOB: 1933-08-02 DOA: 04/03/2014  PCP: Lanette Hampshire, MD  Interim Summary Patient is a pleasant 80 year old female with a past medical history of chronic systolic congestive heart failure having an ejection fraction of 20-25%, atrial fibrillation currently on anticoagulation who presented to the emergency room at [facility] on 04/03/2014 complaining of shortness of breath, nausea vomiting and right upper quadrant pain. Initial workup in the emergency room included a chest x-ray that showed cardiomegaly without overt failure, BNP of 19,030, Creatinine of 2.07. She was also found to have an elevated bilirubin of 3.0 and alk phos of 122. RUQ US showing thickened gallbladder. She was transferred from [facility] to [facility] for surgical evaluation. Liver enzymes trending down. HIDA scan did not show evidence of cholecystitis or biliary obstruction. Heart failure team consulted. She underwent right side heart cath which cardiac out put low, well compensated filling pressure. She also underwent AV node ablation. She was on Milrinone Gtt for 24 hours. Now on oral Lasix.   Assessment/Plan: 1. Acute on Chronic Combine Systolic and diastolic Congestive Heart Failure -Patient presented with signs and symptoms consistent with acute CHF.  -Transthoracic echocardiogram EF 15 to 20 %. Grade 3 diastolic dysfunction. -Patient S/P right side heart Cath, cardiac out put low, well compensated filling pressure.  -Patient S/P AV node ablation 8-7. -Started on Milrinone Gtt on 8-8.-- stopped 8-10.  -Due to Worsening dyspnea, restarted on IV lasix 8-11 and now on oral lasix starting 8/13.   2. Right Upper Quadrant Pain.  Improved.  -Initially there was concern for a cute cholecystitis with her u/s showing thickened gallbladder wal -She was seen and evaluated by surgery as a HIDA scan did not reveal evidence of cholecystitis.  -Liver  enzymes trending down. -Suspect hepatic congestion from Heart failure.   3. Stage III chronic kidney disease -Patient's baseline creatinine near 1.7-1.9 Monitor daily while being diuresed.   4. Hypokalemia -Repleted.   5. History of Chronic Atrial Fibrillation -Presently rate controlled with heart rates in the 80s to 90s -Continue Carvedilol and Amiodarone -Digoxin discontinued.   6. Anticoagulation with Warfarin -INR at 2.7 -Pharmacy consult for warfarin dosing  DVT Prophylaxis: On warfarin Code Status: Full Code Family Communication:  Care discussed with patient and her husband. She is agreeable to SNF. CSW aware.  Disposition Plan: Stable. On oral diuretics. Plan is for DC to SNF 8/14.   Consultants:  Surgery  Heart Failure  Procedures:  HIDA scan Impression:  Normal hepatobiliary nuclear medicine scan demonstrating no evidence  of cholecystitis or biliary obstruction. Ultrasound findings may  have related to relative contraction of the gallbladder at the time  of imaging   HPI/Subjective: She denies complaints. No chest pain. Breathing is stable.   Objective: Filed Vitals:   04/13/14 0900  BP: 102/50  Pulse:   Temp:   Resp:     Intake/Output Summary (Last 24 hours) at 04/13/14 1047 Last data filed at 04/13/14 0921  Gross per 24 hour  Intake   1185 ml  Output   1426 ml  Net   -241 ml   Filed Weights   04/12/14 0521 04/13/14 0556 04/13/14 1003  Weight: 79.2 kg (174 lb 9.7 oz) 73.8 kg (162 lb 11.2 oz) 79.153 kg (174 lb 8 oz)    Exam:   General: Awake and alert, No distress  Cardiovascular: normal S1S2,  positive JVD. Systolic murmur over precordium  Respiratory:  Improved air entry  over bases today. Few bilateral crackles.   Abdomen: Soft, nondistended, NT, no rebound tenderness  Musculoskeletal: trace edema.  Data Reviewed: Basic Metabolic Panel:  Recent Labs Lab 04/07/14 0429 04/07/14 1907  04/09/14 0424 04/10/14 0358  04/11/14 0516 04/12/14 0550 04/13/14 0530  NA 141 142  < > 138 139 140 141 139  K 3.5* 4.2  < > 3.9 4.3 3.4* 3.8 3.4*  CL 103 102  < > 99 102 101 102 102  CO2 24 26  < > _0 GLUCOSE 86 111*  < > 95 94 95 91 89  BUN 50* 43*  < > 45* 48* 46* 40* 43*  CREATININE 1.84* 1.71*  < > 1.70* 1.75* 1.71* 1.89* 1.81*  CALCIUM 8.7 9.1  < > 9.1 8.8 8.8 8.9 8.6  MG 2.1 2.1  --   --   --   --   --   --   < > = values in this interval not displayed. CBC:  Recent Labs Lab 04/07/14 0429 04/08/14 0400 04/09/14 0424 04/13/14 0530  WBC 5.4 5.5 6.5 5.2  HGB 11.3* 11.2* 11.5* 10.7*  HCT 35.2* 34.9* 35.0* 33.0*  MCV 89.8 87.3 87.3 89.4  PLT 230 234 223 223   BNP (last 3 results)  Recent Labs  04/03/14 1056 04/05/14 1312 04/09/14 0424  PROBNP 19030.0* 5310.0* 3361.0*   CBG:  Recent Labs Lab 04/07/14 1631  GLUCAP 96    Studies: No results found.  Scheduled Meds: . amiodarone  200 mg Oral BID  . atorvastatin  20 mg Oral Daily  . carvedilol  3.125 mg Oral BID WC  . colchicine  0.3 mg Oral Daily  . Febuxostat  1 tablet Oral Daily  . hydrALAZINE  25 mg Oral 3 times per day  . isosorbide mononitrate  60 mg Oral Daily  . sodium chloride  3 mL Intravenous Q12H  . sodium chloride  3 mL Intravenous Q12H  . Warfarin - Pharmacist Dosing Inpatient   Does not apply q1800   Continuous Infusions:    Principal Problem:   Acute on chronic systolic HF (heart failure) Active Problems:   Atrial fibrillation, chronic   Nonischemic cardiomyopathy   Hypertension   Anemia, normocytic normochromic   Chronic anticoagulation   Cholecystitis, acute   Acute renal failure superimposed on stage 3 chronic kidney disease   Acute on chronic systolic heart failure   Dyspnea   Time spent: 25 min   [facility] Pager [phone]   If 7PM-7AM, please contact night-coverage at [email or web address], password [facility] 04/13/2014, 10:47 AM  LOS: 10 days

## 2014-04-13 NOTE — Progress Notes (Signed)
DAILY PROGRESS NOTE  Subjective:   Milrinone stopped Monday. IV lasix stopped after morning dose yesterday.  Weight stable.  Still SOB with minimal activity but ok at rest.  PT recommending SNF. Renal function stable.   Objective:  Temp:  [97.7 F (36.5 C)-98 F (36.7 C)] 97.8 F (36.6 C) (08/13 0555) Pulse Rate:  [70-83] 78 (08/13 0555) Resp:  [20] 20 (08/13 0555) BP: (102-123)/(49-83) 102/50 mmHg (08/13 0900) SpO2:  [98 %-100 %] 98 % (08/13 0555) Weight:  [73.8 kg (162 lb 11.2 oz)] 73.8 kg (162 lb 11.2 oz) (08/13 0556) Weight change: -5.4 kg (-11 lb 14.5 oz)  Intake/Output from previous day: 08/12 0701 - 08/13 0700 In: 1427 [P.O.:1424; I.V.:3] Out: 1426 [Urine:1425; Stool:1]  Intake/Output from this shift: Total I/O In: 240 [P.O.:240] Out: -   Medications: Current Facility-Administered Medications  Medication Dose Route Frequency Provider Last Rate Last Dose  . 0.9 %  sodium chloride infusion  250 mL Intravenous PRN Jolaine Artist, MD      . 0.9 %  sodium chloride infusion  250 mL Intravenous PRN Evans Lance, MD      . acetaminophen (TYLENOL) suppository 650 mg  650 mg Rectal Q6H PRN Radene Gunning, NP      . acetaminophen (TYLENOL) tablet 650 mg  650 mg Oral Q4H PRN Evans Lance, MD      . amiodarone (PACERONE) tablet 200 mg  200 mg Oral BID Jolaine Artist, MD   200 mg at 04/13/14 0915  . atorvastatin (LIPITOR) tablet 20 mg  20 mg Oral Daily Radene Gunning, NP   20 mg at 04/13/14 0915  . bisacodyl (DULCOLAX) suppository 10 mg  10 mg Rectal Daily PRN Radene Gunning, NP      . carvedilol (COREG) tablet 3.125 mg  3.125 mg Oral BID WC Jolaine Artist, MD   3.125 mg at 04/13/14 0914  . colchicine tablet 0.3 mg  0.3 mg Oral Daily Kelvin Cellar, MD   0.3 mg at 04/13/14 0916  . Febuxostat TABS 80 mg  1 tablet Oral Daily Lezlie Octave Black, NP   80 mg at 04/13/14 3790  . hydrALAZINE (APRESOLINE) tablet 25 mg  25 mg Oral 3 times per day Jolaine Artist, MD   25  mg at 04/13/14 0600  . HYDROcodone-acetaminophen (NORCO) 7.5-325 MG per tablet 1 tablet  1 tablet Oral Q4H PRN Radene Gunning, NP   1 tablet at 04/07/14 1054  . isosorbide mononitrate (IMDUR) 24 hr tablet 60 mg  60 mg Oral Daily Radene Gunning, NP   60 mg at 04/13/14 0915  . nitroGLYCERIN (NITROSTAT) SL tablet 0.4 mg  0.4 mg Sublingual Q5 min PRN Radene Gunning, NP      . ondansetron [facility]) injection 4 mg  4 mg Intravenous Q6H PRN Evans Lance, MD      . sodium chloride 0.9 % injection 3 mL  3 mL Intravenous Q12H Jolaine Artist, MD   3 mL at 04/13/14 0916  . sodium chloride 0.9 % injection 3 mL  3 mL Intravenous PRN Jolaine Artist, MD      . sodium chloride 0.9 % injection 3 mL  3 mL Intravenous Q12H Evans Lance, MD   3 mL at 04/13/14 0916  . sodium chloride 0.9 % injection 3 mL  3 mL Intravenous PRN Evans Lance, MD      . Warfarin - Pharmacist Dosing Inpatient  Does not apply [place]        Physical Exam: General appearance: elderly female lying in bed NAD Neck: JVP 7-8 no carotid bruit and supple, symmetrical, trachea midline Lungs: diminished breath sounds bilaterally. Heart: regular rate and rhythm Abdomen: soft,NT ND Extremities: warm no tr edema   Lab Results: Results for orders placed during the hospital encounter of 04/03/14 (from the past 48 hour(s))  PROTIME-INR     Status: Abnormal   Collection Time    04/12/14  5:50 AM      Result Value Ref Range   Prothrombin Time 30.5 (*) 11.6 - 15.2 seconds   INR 2.92 (*) 0.00 - 0.93  BASIC METABOLIC PANEL     Status: Abnormal   Collection Time    04/12/14  5:50 AM      Result Value Ref Range   Sodium 141  137 - 147 mEq/L   Potassium 3.8  3.7 - 5.3 mEq/L   Chloride 102  96 - 112 mEq/L   CO2 25  19 - 32 mEq/L   Glucose, Bld 91  70 - 99 mg/dL   BUN 40 (*) 6 - 23 mg/dL   Creatinine, Ser 1.89 (*) 0.50 - 1.10 mg/dL   Calcium 8.9  8.4 - 10.5 mg/dL   GFR calc non Af Amer 24 (*) >90 mL/min   GFR  calc Af Amer 28 (*) >90 mL/min   Comment: (NOTE)     The eGFR has been calculated using the CKD EPI equation.     This calculation has not been validated in all clinical situations.     eGFR's persistently <90 mL/min signify possible Chronic Kidney     Disease.   Anion gap 14  5 - 15  PROTIME-INR     Status: Abnormal   Collection Time    04/13/14  5:30 AM      Result Value Ref Range   Prothrombin Time 30.0 (*) 11.6 - 15.2 seconds   INR 2.86 (*) 0.00 - 2.35  BASIC METABOLIC PANEL     Status: Abnormal   Collection Time    04/13/14  5:30 AM      Result Value Ref Range   Sodium 139  137 - 147 mEq/L   Potassium 3.4 (*) 3.7 - 5.3 mEq/L   Chloride 102  96 - 112 mEq/L   CO2 24  19 - 32 mEq/L   Glucose, Bld 89  70 - 99 mg/dL   BUN 43 (*) 6 - 23 mg/dL   Creatinine, Ser 1.81 (*) 0.50 - 1.10 mg/dL   Calcium 8.6  8.4 - 10.5 mg/dL   GFR calc non Af Amer 26 (*) >90 mL/min   GFR calc Af Amer 30 (*) >90 mL/min   Comment: (NOTE)     The eGFR has been calculated using the CKD EPI equation.     This calculation has not been validated in all clinical situations.     eGFR's persistently <90 mL/min signify possible Chronic Kidney     Disease.   Anion gap 13  5 - 15  CBC     Status: Abnormal   Collection Time    04/13/14  5:30 AM      Result Value Ref Range   WBC 5.2  4.0 - 10.5 K/uL   RBC 3.69 (*) 3.87 - 5.11 MIL/uL   Hemoglobin 10.7 (*) 12.0 - 15.0 g/dL   HCT 33.0 (*) 36.0 - 46.0 %   MCV 89.4  78.0 - 100.0 fL   MCH 29.0  26.0 - 34.0 pg   MCHC 32.4  30.0 - 36.0 g/dL   RDW 14.5  11.5 - 15.5 %   Platelets 223  150 - 400 K/uL    Imaging: No results found.  Assessment:  1. A/c systolic HF d/t NICM 2. Cardiogenic shock 3. CKD stage IV 4. Chronic AF - s/p AV node ablation 5. Frequent PVCs  Plan:   She is better with diuresis but still very tenuous with NYHA IIIb symptoms. She remains hesitant to go home on milrinone as she feels it didn't help her. I think we have her as good as we  can get her for now. Will place on lasix 80/40 po.   PT assessment reviewed and they are suggesting SNF. She would like to go to [facility]  Overall, I am concerned that she is nearing end-stage HF with severe debility.  I think there is a small chance that restoration of sinus rhythm and abolition of PVCs may help her but it may not be enough. We are loading amio and tentatively planning DC-CV in 1 month. Continue coumadin  Carvedilol has been cut back. titrate hydralazine to 25 tid hold SBP < 90. No ACE due to CKD.   Length of Stay:  LOS: 10 days   Glori Bickers MD 04/13/2014, 9:56 AM

## 2014-04-14 LAB — BASIC METABOLIC PANEL
Anion gap: 13 (ref 5–15)
BUN: 45 mg/dL — ABNORMAL HIGH (ref 6–23)
CO2: 25 mEq/L (ref 19–32)
Calcium: 8.7 mg/dL (ref 8.4–10.5)
Chloride: 102 mEq/L (ref 96–112)
Creatinine, Ser: 2.08 mg/dL — ABNORMAL HIGH (ref 0.50–1.10)
GFR calc Af Amer: 25 mL/min — ABNORMAL LOW (ref >90)
GFR, EST NON AFRICAN AMERICAN: 22 mL/min — AB (ref >90)
GLUCOSE: 90 mg/dL (ref 70–99)
POTASSIUM: 3.4 meq/L — AB (ref 3.7–5.3)
Sodium: 140 mEq/L (ref 137–147)

## 2014-04-14 LAB — PROTIME-INR
INR: 2.86 — ABNORMAL HIGH (ref 0.00–1.49)
Prothrombin Time: 30 seconds — ABNORMAL HIGH (ref 11.6–15.2)

## 2014-04-14 MED ORDER — POTASSIUM CHLORIDE CRYS ER 20 MEQ PO TBCR
40.0000 meq | EXTENDED_RELEASE_TABLET | ORAL | Status: AC
  Administered 2014-04-14 (×2): 40 meq via ORAL
  Filled 2014-04-14 (×2): qty 2

## 2014-04-14 MED ORDER — WARFARIN SODIUM 7.5 MG PO TABS
7.5000 mg | ORAL_TABLET | Freq: Once | ORAL | Status: AC
  Administered 2014-04-14: 7.5 mg via ORAL
  Filled 2014-04-14: qty 1

## 2014-04-14 NOTE — Clinical Social Work Psychosocial (Signed)
     Clinical Social Work Department BRIEF PSYCHOSOCIAL ASSESSMENT 04/14/2014  Patient:  [name], [name]     Account Number:  [id number]     Admit date:  04/03/2014  Clinical Social Worker:  Iona Coach  Date/Time:  04/14/2014 10:34 AM  Referred by:  Physician  Date Referred:  04/12/2014 Referred for  SNF Placement   Other Referral:   Interview type:  Other - See comment Other interview type:   Patient and husband (via telephone)    PSYCHOSOCIAL DATA Living Status:  HUSBAND Admitted from facility:   Level of care:   Primary support name:  [name]  [phone] Primary support relationship to patient:  SPOUSE Degree of support available:   Strong support    CURRENT CONCERNS Current Concerns  Post-Acute Placement   Other Concerns:    SOCIAL WORK ASSESSMENT / PLAN CSW met with patient today- she is now agreeable to short term SNF- has been admantly refusing in the past. She lives in [place] with her husband [name] and wants to return home as soon as possible  CSW discussed with Dr. Maryland Pink- may be medically stable today for d/c. Patient is requesting the [facility]. She has [employer] and will require prior authorization.  Fl2 completed for MD signature. Patient is regular [employer] and may require letter of guarantee for placement.   Assessment/plan status:  Psychosocial Support/Ongoing Assessment of Needs Other assessment/ plan:   Information/referral to community resources:   SNF list provided. [place]    PATIENTS/FAMILYS RESPONSE TO PLAN OF CARE: Patient is alert and oriented; she is agreeable to short term SNF but is very anxious to return home.  Bed search process started and discussed with patient and her husband. They both want the [facility] if possible. Will assist with SNF placement. Possibly ready for d/c today per MD.

## 2014-04-14 NOTE — Progress Notes (Signed)
Physical Therapy Treatment Patient Details Name: [name] MRN: [id number] DOB: 02-10-1933 Today's Date: 16-Apr-2014    History of Present Illness Pt is a 80 y/o female admitted s/p complaints of nausea, vomiting, abdominal pain. Ultrasound of right upper quadrant indicates thickened gallbladder. Pt was admitted for surgical evaluation for cholecystitis.    PT Comments    Patient making slow progress with PT.  Follow Up Recommendations  SNF;Supervision/Assistance - 24 hour     Equipment Recommendations  Rolling walker with 5" wheels    Recommendations for Other Services       Precautions / Restrictions Precautions Precautions: Fall Restrictions Weight Bearing Restrictions: No    Mobility  Bed Mobility Overal bed mobility: Needs Assistance Bed Mobility: Supine to Sit;Sit to Supine     Supine to sit: Supervision Sit to supine: Supervision   General bed mobility comments: Requires increased time and supervision for safety.  Transfers Overall transfer level: Needs assistance Equipment used: Rolling walker (2 wheeled) Transfers: Sit to/from Stand Sit to Stand: Min guard         General transfer comment: Verbal cues for hand placement.  Ambulation/Gait Ambulation/Gait assistance: Min guard Ambulation Distance (Feet): 100 Feet Assistive device: Rolling walker (2 wheeled) Gait Pattern/deviations: Step-through pattern;Decreased step length - right;Decreased step length - left;Decreased stride length;Shuffle;Trunk flexed Gait velocity: Decreased Gait velocity interpretation: Below normal speed for age/gender General Gait Details: Verbal cues to stand upright and look ahead during gait.  Patient with very slow deliberate gait.  Patient with 2/4 dyspnea with gait - manages dyspnea with slow mobility.   Stairs            Wheelchair Mobility    Modified Rankin (Stroke Patients Only)       Balance                                     Cognition Arousal/Alertness: Awake/alert Behavior During Therapy: WFL for tasks assessed/performed Overall Cognitive Status: Within Functional Limits for tasks assessed                      Exercises      General Comments        Pertinent Vitals/Pain Pain Assessment: No/denies pain    Home Living                      Prior Function            PT Goals (current goals can now be found in the care plan section) Progress towards PT goals: Progressing toward goals    Frequency  Min 2X/week    PT Plan Current plan remains appropriate    Co-evaluation             End of Session Equipment Utilized During Treatment: Gait belt Activity Tolerance: Patient limited by fatigue Patient left: in bed;with call bell/phone within reach     Time: 1126-1149 PT Time Calculation (min): 23 min  Charges:  $Gait Training: 23-37 mins                    G Codes:      Vena Austria 2014/04/16, 3:02 PM Durenda Hurt. Renaldo Fiddler, [facility] Acute Rehab Services Pager [phone]

## 2014-04-14 NOTE — Clinical Social Work Placement (Addendum)
    Clinical Social Work Department CLINICAL SOCIAL WORK PLACEMENT NOTE 04/21/2014  Patient:  [name], [name]  Account Number:  [id number] Admit date:  04/03/2014  Clinical Social Worker:  Lupita Leash Tziporah Knoke, LCSWA  Date/time:  04/14/2014 10:52 AM  Clinical Social Work is seeking post-discharge placement for this patient at the following level of care:   SKILLED NURSING   (*CSW will update this form in Epic as items are completed)   04/14/2014  Patient/family provided with [facility] Department of Clinical Social Work's list of facilities offering this level of care within the geographic area requested by the patient (or if unable, by the patient's family).  04/14/2014  Patient/family informed of their freedom to choose among providers that offer the needed level of care, that participate in Medicare, Medicaid or managed care program needed by the patient, have an available bed and are willing to accept the patient.  04/14/2014  Patient/family informed of [facility]' ownership interest in [facility], as well as of the fact that they are under no obligation to receive care at this facility.  PASARR submitted to EDS on 04/14/2014 PASARR number received on 04/14/2014  FL2 transmitted to all facilities in geographic area requested by pt/family on  04/14/2014 FL2 transmitted to all facilities within larger geographic area on   Patient informed that his/her managed care company has contracts with or will negotiate with  certain facilities, including the following:   [employer]- not [employer]    Will require auth prior to admission vs LOG     Patient/family informed of bed offers received:  04/14/2014 Patient chooses bed at [facility] Physician recommends and patient chooses bed at    Patient to be transferred to  on   Patient to be transferred to facility by car Patient and family notified of transfer on  Name of family member notified:    The following  physician request were entered in Epic:   Additional Comments: 04/14/14  Patient received and accepted a bed offer from [facility]. Patient has [facility] which requires pre-auth.  CSW arranged for completion of Letter of Guarantee. CSW spoke to Dr. Rito Ehrlich who stated that patient is not medically stable for d/c and anticipates she will need to remain in the hospital over the weekend.  CSW notified patient and Lynnea Ferrier- Admissions at the [facility]. Lorri Frederick. Andria Rhein [phone]  04/21/14 Patient will d/c home today.  [employer] denied original request for SNF placement at the [facility]. She now requires Milrinone drip and thus would be authorized-however- the [facility] cannot accept patient's with Milrinone.  CSW discussed with patient mulitple times- she adamantly refuses any other placement and now demand to return home.  Patient and her husband are both agreeable to home health to be arranged by [facility]. CSW signing off.

## 2014-04-14 NOTE — Progress Notes (Signed)
ANTICOAGULATION CONSULT NOTE - Follow-up Consult  Pharmacy Consult for Coumadin Indication: atrial fibrillation  Allergies  Allergen Reactions  . Tuberculin Tests Swelling    Patient Measurements: Height: 5\' 7"  (170.2 cm) Weight: 175 lb (79.379 kg) (scale C) IBW/kg (Calculated) : 61.6   Vital Signs: Temp: 97.4 F (36.3 C) (08/14 0510) Temp src: Oral (08/14 0510) BP: 99/70 mmHg (08/14 0510) Pulse Rate: 71 (08/14 0510)  Labs:  Recent Labs  04/12/14 0550 04/13/14 0530 04/14/14 0447  HGB  --  10.7*  --   HCT  --  33.0*  --   PLT  --  223  --   LABPROT 30.5* 30.0* 30.0*  INR 2.92* 2.86* 2.86*  CREATININE 1.89* 1.81* 2.08*    Estimated Creatinine Clearance: 24.2 ml/min (by C-G formula based on Cr of 2.08).   Assessment: 80 yo female transferred from [facility] 8/3 for surgical evaluation for possible cholecystitis. Coumadin was on hold in case surgery needed. HIDA scan negative on 8/5.  INR continues to be therapeutic at 2.86. Marland Kitchen Of note, patient was started on amiodarone on 8/10 which may potentiate the effects of Coumadin so may require lower dose. H/H low but relatively stable, plt wnl and stable with no reported s/s bleeding.  Home dose: 5mg  daily except for 7.5mg  on Thur. Last home dose 7/31.  Goal of Therapy:  Monitor platelets by anticoagulation protocol: Yes   Plan:  - Coumadin 5 mg PO x 1 tonight - Continue daily INR - Monitor CBC, s/s bleeding  Tad Moore, BCPS  Clinical Pharmacist Pager [phone]  04/14/2014 10:41 AM

## 2014-04-14 NOTE — Progress Notes (Signed)
[facility] PROGRESS NOTE  DAYNE DEKAY UKG:[id number] DOB: 1933-06-21 DOA: 04/03/2014  PCP: Lanette Hampshire, MD  Interim Summary Patient is a pleasant 80 year old female with a past medical history of chronic systolic congestive heart failure having an ejection fraction of 20-25%, atrial fibrillation currently on anticoagulation who presented to the emergency room at [facility] on 04/03/2014 complaining of shortness of breath, nausea vomiting and right upper quadrant pain. Initial workup in the emergency room included a chest x-ray that showed cardiomegaly without overt failure, BNP of 19,030, Creatinine of 2.07. She was also found to have an elevated bilirubin of 3.0 and alk phos of 122. RUQ US showing thickened gallbladder. She was transferred from [facility] to [facility] for surgical evaluation. Liver enzymes trending down. HIDA scan did not show evidence of cholecystitis or biliary obstruction. Heart failure team consulted. She underwent right side heart cath which cardiac out put low, well compensated filling pressure. She also underwent AV node ablation. She was on Milrinone Gtt for 24 hours. She was transitioned to oral Lasix. On 8/14 her creatinine was noted to climb and she had low BP. Diuretics were held per cardiology.  Assessment/Plan: 1. Acute on Chronic Combine Systolic and diastolic Congestive Heart Failure -Patient presented with signs and symptoms consistent with acute CHF.  -Transthoracic echocardiogram EF 15 to 20 %. Grade 3 diastolic dysfunction. -Patient S/P right side heart Cath, cardiac out put low, well compensated filling pressure.  -Patient S/P AV node ablation 8-7. -Started on Milrinone Gtt on 8-8.-- stopped 8-10.  -Due to Worsening dyspnea, restarted on IV lasix 8-11 and was transitioned to oral lasix starting 8/13. But due to worsening renal function lasix has been held now.   2. Right Upper Quadrant Pain.  Improved.  -Initially there  was concern for a cute cholecystitis with her u/s showing thickened gallbladder wal -She was seen and evaluated by surgery as a HIDA scan did not reveal evidence of cholecystitis.  -Liver enzymes trending down. -Suspect hepatic congestion from Heart failure.   3. Stage III chronic kidney disease with Acute worsening Creatinine elevated from baseline today. Patient's baseline creatinine near 1.7-1.9. Diuretics held for now.  4. Hypokalemia -Repleted.   5. History of Chronic Atrial Fibrillation -Presently rate controlled with heart rates in the 80s to 90s -Continue Carvedilol and Amiodarone -Digoxin discontinued.   6. Anticoagulation with Warfarin -INR at 2.7 -Pharmacy consult for warfarin dosing  DVT Prophylaxis: On warfarin Code Status: Full Code Family Communication:  Care discussed with patient. Disposition Plan: SNF eventually but not yet ready for discharge.    Consultants:  Surgery  Heart Failure  Procedures:  HIDA scan Impression:  Normal hepatobiliary nuclear medicine scan demonstrating no evidence  of cholecystitis or biliary obstruction. Ultrasound findings may  have related to relative contraction of the gallbladder at the time  of imaging   HPI/Subjective: She feels well. Denies worsening shortness of breath.   Objective: Filed Vitals:   04/14/14 1053  BP: 88/62  Pulse:   Temp:   Resp:     Intake/Output Summary (Last 24 hours) at 04/14/14 1156 Last data filed at 04/14/14 1133  Gross per 24 hour  Intake    720 ml  Output   1300 ml  Net   -580 ml   Filed Weights   04/13/14 0556 04/13/14 1003 04/14/14 0510  Weight: 73.8 kg (162 lb 11.2 oz) 79.153 kg (174 lb 8 oz) 79.379 kg (175 lb)    Exam:   General:  Awake and alert, No distress  Cardiovascular: normal S1S2,  positive JVD. Systolic murmur over precordium  Respiratory:  Improved air entry over bases. No crackles.   Abdomen: Soft, nondistended, NT, no rebound  tenderness  Musculoskeletal: trace edema.  Data Reviewed: Basic Metabolic Panel:  Recent Labs Lab 04/07/14 1907  04/10/14 0358 04/11/14 0516 04/12/14 0550 04/13/14 0530 04/14/14 0447  NA 142  < > 139 140 141 139 140  K 4.2  < > 4.3 3.4* 3.8 3.4* 3.4*  CL 102  < > 102 101 102 102 102  CO2 26  < > _0 GLUCOSE 111*  < > 94 95 91 89 90  BUN 43*  < > 48* 46* 40* 43* 45*  CREATININE 1.71*  < > 1.75* 1.71* 1.89* 1.81* 2.08*  CALCIUM 9.1  < > 8.8 8.8 8.9 8.6 8.7  MG 2.1  --   --   --   --   --   --   < > = values in this interval not displayed. CBC:  Recent Labs Lab 04/08/14 0400 04/09/14 0424 04/13/14 0530  WBC 5.5 6.5 5.2  HGB 11.2* 11.5* 10.7*  HCT 34.9* 35.0* 33.0*  MCV 87.3 87.3 89.4  PLT 234 223 223   BNP (last 3 results)  Recent Labs  04/03/14 1056 04/05/14 1312 04/09/14 0424  PROBNP 19030.0* 5310.0* 3361.0*   CBG:  Recent Labs Lab 04/07/14 1631  GLUCAP 96    Studies: No results found.  Scheduled Meds: . amiodarone  200 mg Oral BID  . atorvastatin  20 mg Oral Daily  . carvedilol  3.125 mg Oral BID WC  . colchicine  0.3 mg Oral Daily  . Febuxostat  1 tablet Oral Daily  . hydrALAZINE  25 mg Oral 3 times per day  . isosorbide mononitrate  60 mg Oral Daily  . potassium chloride  40 mEq Oral Q4H  . sodium chloride  3 mL Intravenous Q12H  . sodium chloride  3 mL Intravenous Q12H  . warfarin  7.5 mg Oral ONCE-1800  . Warfarin - Pharmacist Dosing Inpatient   Does not apply q1800   Continuous Infusions:    Principal Problem:   Acute on chronic systolic HF (heart failure) Active Problems:   Atrial fibrillation, chronic   Nonischemic cardiomyopathy   Hypertension   Anemia, normocytic normochromic   Chronic anticoagulation   Cholecystitis, acute   Acute renal failure superimposed on stage 3 chronic kidney disease   Acute on chronic systolic heart failure   Dyspnea   Time spent: 25 min   [place]  [facility] Pager [phone]   If 7PM-7AM, please contact night-coverage at [email or web address], password [facility] 04/14/2014, 11:56 AM  LOS: 11 days

## 2014-04-14 NOTE — Progress Notes (Addendum)
DAILY PROGRESS NOTE  Subjective:   80 y/o with severe NICM and chronic AF s/p AV node ablation. Admitted with worsening HF. ICD interrogation showed 1/3 of ventricular beats actually conducted through AV node limiting BivPacing to 2/3 of time. Underwent repeat AV node ablation this admit. Also was on milrinone for brief time but stopped as she felt it didn't help.  RHC on 8/7 as below. Amio started with eye toward DC-CV in 4 weeks.   RA = 9  RV = 34/2/6  PA = 34/16 (24)  PCW = 12  Fick cardiac output/index = 3.6/1.8  PVR = 3.3 WU  FA sat = 98%  PA sat = 50%, 52%   Milrinone stopped Monday.Switched to po lasix yesterday. SBP now in 80s. Renal function worse. Weight stable  Denies dizziness or SOB.   Objective:  Temp:  [97.4 F (36.3 C)-98.4 F (36.9 C)] 97.4 F (36.3 C) (08/14 0510) Pulse Rate:  [71-72] 71 (08/14 0510) Resp:  [16-18] 16 (08/14 0510) BP: (88-114)/(62-70) 88/62 mmHg (08/14 1053) SpO2:  [99 %-100 %] 99 % (08/14 0510) Weight:  [79.379 kg (175 lb)] 79.379 kg (175 lb) (08/14 0510) Weight change: 5.353 kg (11 lb 12.8 oz)  Intake/Output from previous day: 08/13 0701 - 08/14 0700 In: 720 [P.O.:720] Out: 1100 [Urine:1100]  Intake/Output from this shift: Total I/O In: 240 [P.O.:240] Out: 450 [Urine:450]  Medications: Current Facility-Administered Medications  Medication Dose Route Frequency Provider Last Rate Last Dose  . 0.9 %  sodium chloride infusion  250 mL Intravenous PRN Jolaine Artist, MD      . 0.9 %  sodium chloride infusion  250 mL Intravenous PRN Evans Lance, MD      . acetaminophen (TYLENOL) suppository 650 mg  650 mg Rectal Q6H PRN Radene Gunning, NP      . acetaminophen (TYLENOL) tablet 650 mg  650 mg Oral Q4H PRN Evans Lance, MD      . amiodarone (PACERONE) tablet 200 mg  200 mg Oral BID Jolaine Artist, MD   200 mg at 04/14/14 1046  . atorvastatin (LIPITOR) tablet 20 mg  20 mg Oral Daily Lezlie Octave Black, NP   20 mg at 04/14/14  1045  . bisacodyl (DULCOLAX) suppository 10 mg  10 mg Rectal Daily PRN Radene Gunning, NP      . carvedilol (COREG) tablet 3.125 mg  3.125 mg Oral BID WC Jolaine Artist, MD   3.125 mg at 04/13/14 1702  . colchicine tablet 0.3 mg  0.3 mg Oral Daily Kelvin Cellar, MD   0.3 mg at 04/14/14 1044  . Febuxostat TABS 80 mg  1 tablet Oral Daily Lezlie Octave Black, NP   80 mg at 04/14/14 1045  . furosemide (LASIX) tablet 80 mg  80 mg Oral Daily Jolaine Artist, MD   80 mg at 04/14/14 1044   And  . furosemide (LASIX) tablet 40 mg  40 mg Oral QPM Jolaine Artist, MD   40 mg at 04/13/14 1701  . hydrALAZINE (APRESOLINE) tablet 25 mg  25 mg Oral 3 times per day Jolaine Artist, MD   25 mg at 04/14/14 0515  . HYDROcodone-acetaminophen (NORCO) 7.5-325 MG per tablet 1 tablet  1 tablet Oral Q4H PRN Radene Gunning, NP   1 tablet at 04/07/14 1054  . isosorbide mononitrate (IMDUR) 24 hr tablet 60 mg  60 mg Oral Daily Radene Gunning, NP   60 mg at 04/14/14  1045  . nitroGLYCERIN (NITROSTAT) SL tablet 0.4 mg  0.4 mg Sublingual Q5 min PRN Radene Gunning, NP      . ondansetron [facility]) injection 4 mg  4 mg Intravenous Q6H PRN Evans Lance, MD   4 mg at 04/13/14 1820  . potassium chloride SA (K-DUR,KLOR-CON) CR tablet 40 mEq  40 mEq Oral Q4H Jolaine Artist, MD   40 mEq at 04/14/14 1051  . sodium chloride 0.9 % injection 3 mL  3 mL Intravenous Q12H Jolaine Artist, MD   3 mL at 04/14/14 1046  . sodium chloride 0.9 % injection 3 mL  3 mL Intravenous PRN Jolaine Artist, MD      . sodium chloride 0.9 % injection 3 mL  3 mL Intravenous Q12H Evans Lance, MD   3 mL at 04/14/14 1046  . sodium chloride 0.9 % injection 3 mL  3 mL Intravenous PRN Evans Lance, MD      . warfarin (COUMADIN) tablet 7.5 mg  7.5 mg Oral ONCE-1800 Gay Filler Carney, RPH      . Warfarin - Pharmacist Dosing Inpatient   Does not apply [facility], [facility]        Physical Exam: General appearance: elderly female lying in bed  NAD Neck: JVP flat no carotid bruit and supple, symmetrical, trachea midline Lungs: diminished breath sounds bilaterally. Heart:IRR. Prominent s3 Abdomen: soft,NT ND Extremities: warm no tr edema   Lab Results: Results for orders placed during the hospital encounter of 04/03/14 (from the past 48 hour(s))  PROTIME-INR     Status: Abnormal   Collection Time    04/13/14  5:30 AM      Result Value Ref Range   Prothrombin Time 30.0 (*) 11.6 - 15.2 seconds   INR 2.86 (*) 0.00 - 7.74  BASIC METABOLIC PANEL     Status: Abnormal   Collection Time    04/13/14  5:30 AM      Result Value Ref Range   Sodium 139  137 - 147 mEq/L   Potassium 3.4 (*) 3.7 - 5.3 mEq/L   Chloride 102  96 - 112 mEq/L   CO2 24  19 - 32 mEq/L   Glucose, Bld 89  70 - 99 mg/dL   BUN 43 (*) 6 - 23 mg/dL   Creatinine, Ser 1.81 (*) 0.50 - 1.10 mg/dL   Calcium 8.6  8.4 - 10.5 mg/dL   GFR calc non Af Amer 26 (*) >90 mL/min   GFR calc Af Amer 30 (*) >90 mL/min   Comment: (NOTE)     The eGFR has been calculated using the CKD EPI equation.     This calculation has not been validated in all clinical situations.     eGFR's persistently <90 mL/min signify possible Chronic Kidney     Disease.   Anion gap 13  5 - 15  CBC     Status: Abnormal   Collection Time    04/13/14  5:30 AM      Result Value Ref Range   WBC 5.2  4.0 - 10.5 K/uL   RBC 3.69 (*) 3.87 - 5.11 MIL/uL   Hemoglobin 10.7 (*) 12.0 - 15.0 g/dL   HCT 33.0 (*) 36.0 - 46.0 %   MCV 89.4  78.0 - 100.0 fL   MCH 29.0  26.0 - 34.0 pg   MCHC 32.4  30.0 - 36.0 g/dL   RDW 14.5  11.5 - 15.5 %  Platelets 223  150 - 400 K/uL  PROTIME-INR     Status: Abnormal   Collection Time    04/14/14  4:47 AM      Result Value Ref Range   Prothrombin Time 30.0 (*) 11.6 - 15.2 seconds   INR 2.86 (*) 0.00 - 6.31  BASIC METABOLIC PANEL     Status: Abnormal   Collection Time    04/14/14  4:47 AM      Result Value Ref Range   Sodium 140  137 - 147 mEq/L   Potassium 3.4 (*) 3.7 -  5.3 mEq/L   Chloride 102  96 - 112 mEq/L   CO2 25  19 - 32 mEq/L   Glucose, Bld 90  70 - 99 mg/dL   BUN 45 (*) 6 - 23 mg/dL   Creatinine, Ser 2.08 (*) 0.50 - 1.10 mg/dL   Calcium 8.7  8.4 - 10.5 mg/dL   GFR calc non Af Amer 22 (*) >90 mL/min   GFR calc Af Amer 25 (*) >90 mL/min   Comment: (NOTE)     The eGFR has been calculated using the CKD EPI equation.     This calculation has not been validated in all clinical situations.     eGFR's persistently <90 mL/min signify possible Chronic Kidney     Disease.   Anion gap 13  5 - 15    Imaging: No results found.  Assessment:  1. A/c systolic HF d/t NICM 2. Cardiogenic shock 3. Acute on CKD stage IV 4. Chronic AF - s/p AV node ablation 5. Frequent PVCs  Plan:   She appears dry. BP low. Renal function worse. Will hold diuretics today. Can restart lasix 80 daily in day or two. Would hold d/c until renal function improving.   Remains very tenuous with NYHA IIIb symptoms. She was hesitant to go home on milrinone as she feels it didn't help her. I think we have her as good as we can get her for now.   PT assessment reviewed and they are suggesting SNF. She would like to go to [facility]  Overall, I am concerned that she is nearing end-stage HF with severe debility.  I think there is a small chance that restoration of sinus rhythm and abolition of PVCs may help her but it may not be enough. We are loading amio and tentatively planning DC-CV in 1 month. Continue coumadin  Carvedilol has been cut back. titrate hydralazine to 25 tid hold SBP < 90. No ACE due to CKD.  F/u appt in HF Clinic on 8/21 at 9:15a in [place]  Length of Stay:  LOS: 11 days   Glori Bickers MD 04/14/2014, 11:51 AM

## 2014-04-14 NOTE — Progress Notes (Signed)
Pt BP 88/62 manually, pt asymptomatic.  PA notified and ordered to still give imdur and lasix.  Will carry out orders and continue to monitor.

## 2014-04-15 LAB — BASIC METABOLIC PANEL
ANION GAP: 14 (ref 5–15)
BUN: 50 mg/dL — ABNORMAL HIGH (ref 6–23)
CHLORIDE: 104 meq/L (ref 96–112)
CO2: 22 meq/L (ref 19–32)
CREATININE: 2.26 mg/dL — AB (ref 0.50–1.10)
Calcium: 9.2 mg/dL (ref 8.4–10.5)
GFR calc Af Amer: 23 mL/min — ABNORMAL LOW (ref >90)
GFR calc non Af Amer: 20 mL/min — ABNORMAL LOW (ref >90)
Glucose, Bld: 96 mg/dL (ref 70–99)
Potassium: 4.5 mEq/L (ref 3.7–5.3)
SODIUM: 140 meq/L (ref 137–147)

## 2014-04-15 LAB — PROTIME-INR
INR: 3.17 — AB (ref 0.00–1.49)
Prothrombin Time: 32.5 seconds — ABNORMAL HIGH (ref 11.6–15.2)

## 2014-04-15 MED ORDER — WARFARIN SODIUM 2.5 MG PO TABS
2.5000 mg | ORAL_TABLET | Freq: Once | ORAL | Status: AC
  Administered 2014-04-15: 2.5 mg via ORAL
  Filled 2014-04-15: qty 1

## 2014-04-15 NOTE — Progress Notes (Signed)
Patient Name: [name] Date of Encounter: 04/15/2014  Principal Problem:   Acute on chronic systolic HF (heart failure) Active Problems:   Atrial fibrillation, chronic   Nonischemic cardiomyopathy   Hypertension   Anemia, normocytic normochromic   Chronic anticoagulation   Cholecystitis, acute   Acute renal failure superimposed on stage 3 chronic kidney disease   Acute on chronic systolic heart failure   Dyspnea   Length of Stay: 12  SUBJECTIVE  Not talking a whole lot. Able to walk to bathroom without dyspnea. -800 mL last 24 h despite holding diuretic.  Weight up 2 lb since yesterday and up 6 lb since her RHC was performed (PAWP was 12 at that time). Despite this, renal function continues to slightly worsen.  CURRENT MEDS . amiodarone  200 mg Oral BID  . atorvastatin  20 mg Oral Daily  . carvedilol  3.125 mg Oral BID WC  . colchicine  0.3 mg Oral Daily  . Febuxostat  1 tablet Oral Daily  . hydrALAZINE  25 mg Oral 3 times per day  . isosorbide mononitrate  60 mg Oral Daily  . sodium chloride  3 mL Intravenous Q12H  . sodium chloride  3 mL Intravenous Q12H  . warfarin  2.5 mg Oral ONCE-1800  . Warfarin - Pharmacist Dosing Inpatient   Does not apply q1800    OBJECTIVE   Intake/Output Summary (Last 24 hours) at 04/15/14 1222 Last data filed at 04/15/14 0913  Gross per 24 hour  Intake    600 ml  Output    950 ml  Net   -350 ml   Filed Weights   04/13/14 1003 04/14/14 0510 04/15/14 0417  Weight: 174 lb 8 oz (79.153 kg) 175 lb (79.379 kg) 177 lb 11.1 oz (80.6 kg)    PHYSICAL EXAM Filed Vitals:   04/14/14 1341 04/14/14 2203 04/15/14 0417 04/15/14 0510  BP: 97/60 108/57 109/63 100/60  Pulse: 70 71 70   Temp: 98.6 F (37 C) 98 F (36.7 C) 97.5 F (36.4 C)   TempSrc: Oral Oral Oral   Resp: 16 17 18    Height:      Weight:   177 lb 11.1 oz (80.6 kg)   SpO2: 100% 100% 100%    General: Alert, oriented x3, no distress Head: no evidence of trauma,  PERRL, EOMI, no exophtalmos or lid lag, no myxedema, no xanthelasma; normal ears, nose and oropharynx Neck: normal jugular venous pulsations and no hepatojugular reflux; brisk carotid pulses without delay and no carotid bruits Chest: clear to auscultation, no signs of consolidation by percussion or palpation, normal fremitus, symmetrical and full respiratory excursions Cardiovascular: normal position and quality of the apical impulse, regular rhythm, normal first and pradoxically split second heart sounds, no rubs or gallops, no murmur Abdomen: no tenderness or distention, no masses by palpation, no abnormal pulsatility or arterial bruits, normal bowel sounds, no hepatosplenomegaly Extremities: no clubbing, cyanosis or edema; 2+ radial, ulnar and brachial pulses bilaterally; 2+ right femoral, posterior tibial and dorsalis pedis pulses; 2+ left femoral, posterior tibial and dorsalis pedis pulses; no subclavian or femoral bruits Neurological: grossly nonfocal  LABS  CBC  Recent Labs  04/13/14 0530  WBC 5.2  HGB 10.7*  HCT 33.0*  MCV 89.4  PLT 223   Basic Metabolic Panel  Recent Labs  04/14/14 0447 04/15/14 0404  NA 140 140  K 3.4* 4.5  CL 102 104  CO2 25 22  GLUCOSE 90 96  BUN 45* 50*  CREATININE 2.08* 2.26*  CALCIUM 8.7 9.2    Radiology Studies No results found.  TELE AF, V-paced   ASSESSMENT AND PLAN Advanced CHF, NYHA class IIIb-IV with limited therapeutic options due to renal failure (acute on chronic). Continue to hold diuretics in face of worsening renal function. Hopefully, will be able to perform successful cardioversion after 3-4 week "loading" with amiodarone and this will lead to improvement in low CO state.   Thurmon FairMihai Alcie Runions, MD, [facility] [phone] office [phone] pager 04/15/2014 12:22 PM

## 2014-04-15 NOTE — Progress Notes (Signed)
PHARMACY NOTE  Pharmacy Consult :  80 y.o. female is currently on chronic Coumadin for atrial fibrillation .   Dosing Wt :  80.6 kg  Hematology :  Recent Labs  04/13/14 0530 04/14/14 0447 04/15/14 0404  HGB 10.7*  --   --   HCT 33.0*  --   --   PLT 223  --   --   LABPROT 30.0* 30.0* 32.5*  INR 2.86* 2.86* 3.17*  CREATININE 1.81* 2.08* 2.26*    Current Medication[s] Include: Medication PTA: Prescriptions prior to admission  Medication Sig Dispense Refill  . atorvastatin (LIPITOR) 20 MG tablet Take 20 mg by mouth daily.      . carvedilol (COREG) 25 MG tablet Take 25 mg by mouth 2 (two) times daily with a meal.      . colchicine 0.6 MG tablet Take 0.6 mg by mouth daily.      . Febuxostat (ULORIC) 80 MG TABS Take 1 tablet by mouth daily.      . furosemide (LASIX) 80 MG tablet Take 80 mg by mouth daily.      . hydrALAZINE (APRESOLINE) 100 MG tablet Take 100 mg by mouth 3 (three) times daily.       Marland Kitchen HYDROcodone-acetaminophen (NORCO) 7.5-325 MG per tablet Take 1 tablet by mouth every 4 (four) hours as needed for moderate pain.       . isosorbide mononitrate (IMDUR) 60 MG 24 hr tablet TAKE 2 TABLETS DAILY  180 tablet  2  . LANOXIN 125 MCG tablet TAKE 1 TABLET DAILY  90 tablet  1  . metFORMIN (GLUCOPHAGE) 500 MG tablet Take 500 mg by mouth 2 (two) times daily with a meal.      . metoprolol tartrate (LOPRESSOR) 25 MG tablet Take 1 tablet (25 mg total) by mouth 2 (two) times daily.  60 tablet  3  . Multiple Vitamin (MULTIVITAMIN) tablet Take 0.5 tablets by mouth daily.       . nitroGLYCERIN (NITROSTAT) 0.4 MG SL tablet Place 0.4 mg under the tongue every 5 (five) minutes as needed for chest pain.       . Omega-3 Fatty Acids (FISH OIL) 1000 MG CAPS Take 1 capsule by mouth daily.      . potassium chloride SA (K-DUR,KLOR-CON) 20 MEQ tablet Take 20-40 mEq by mouth 2 (two) times daily. Take 40 mg by mouth in the morning and 20 mg by mouth in the evening.      .  warfarin (COUMADIN) 5 MG tablet Take 5-7.5 mg by mouth daily. Take 7.5 mg by mouth on Thursday.  Take 5 mg by mouth on all other days.       Scheduled:  Scheduled:  . amiodarone  200 mg Oral BID  . atorvastatin  20 mg Oral Daily  . carvedilol  3.125 mg Oral BID WC  . colchicine  0.3 mg Oral Daily  . Febuxostat  1 tablet Oral Daily  . hydrALAZINE  25 mg Oral 3 times per day  . isosorbide mononitrate  60 mg Oral Daily  . sodium chloride  3 mL Intravenous Q12H  . sodium chloride  3 mL Intravenous Q12H  . Warfarin - Pharmacist Dosing Inpatient   Does not apply q1800    Assessment :  80 yo female transferred from [facility] 8/3 for surgical evaluation for possible cholecystitis.  HIDA scan negative.  Coumadin restarted.  Patient was started on Amiodarone on 8/10.  INR beginning to reflect amiodarone's effect on INR potentiation with Coumadin.  Today's INR is elevated, 3.17 after home dose of Coumadin.   INR  Slightly above therapeutic goal.    No evidence of bleeding complications observed.  Goal :  INR goal is 2-3  Plan : 1. Will reduce Coumadin dose to 2.5 mg po today. 2. Daily INR's, CBC. Monitor for bleeding complications.   Follow Platelet counts.  Cass Edinger, Elisha HeadlandEarle J,  Pharm.D  04/15/2014  11:53 AM

## 2014-04-15 NOTE — Progress Notes (Signed)
[facility] PROGRESS NOTE  [name] TKP:[id number] DOB: 1932-10-12 DOA: 04/03/2014  PCP: Lanette Hampshire, MD  Interim Summary Patient is a pleasant 80 year old female with a past medical history of chronic systolic congestive heart failure having an ejection fraction of 20-25%, atrial fibrillation currently on anticoagulation who presented to the emergency room at [facility] on 04/03/2014 complaining of shortness of breath, nausea vomiting and right upper quadrant pain. Initial workup in the emergency room included a chest x-ray that showed cardiomegaly without overt failure, BNP of 19,030, Creatinine of 2.07. She was also found to have an elevated bilirubin of 3.0 and alk phos of 122. RUQ US showing thickened gallbladder. She was transferred from [facility] to [facility] for surgical evaluation. Liver enzymes trending down. HIDA scan did not show evidence of cholecystitis or biliary obstruction. Heart failure team consulted. She underwent right side heart cath which cardiac out put low, well compensated filling pressure. She also underwent AV node ablation. She was on Milrinone Gtt for 24 hours. She was transitioned to oral Lasix. On 8/14 her creatinine was noted to climb and she had low BP. Diuretics were held per cardiology.  Assessment/Plan: 1. Acute on Chronic Combine Systolic and diastolic Congestive Heart Failure -Patient presented with signs and symptoms consistent with acute CHF.  -Transthoracic echocardiogram EF 15 to 20 %. Grade 3 diastolic dysfunction. -Patient S/P right side heart Cath, cardiac out put low, well compensated filling pressure.  -Patient S/P AV node ablation 8-7. -Started on Milrinone Gtt on 8-8.-- stopped 8-10.  -Due to Worsening dyspnea, restarted on IV lasix 8-11 and was transitioned to oral lasix starting 8/13.  -But due to worsening renal function lasix has been held now. Cardiology following closely.  2. Right Upper Quadrant Pain.   Improved.  -Initially there was concern for a cute cholecystitis with her u/s showing thickened gallbladder wal -She was seen and evaluated by surgery as a HIDA scan did not reveal evidence of cholecystitis.  -Liver enzymes trending down. -Suspect hepatic congestion from Heart failure.   3. Stage III chronic kidney disease with Acute worsening Creatinine continues to ne high. Patient's baseline creatinine near 1.7-1.9. Diuretics held for now.  4. Hypokalemia -Repleted.   5. History of Chronic Atrial Fibrillation -Presently rate controlled with heart rates in the 80s to 90s -Continue Carvedilol and Amiodarone -Digoxin discontinued.   6. Anticoagulation with Warfarin -INR at 2.7 -Pharmacy consult for warfarin dosing  DVT Prophylaxis: On warfarin Code Status: Full Code Family Communication:  Care discussed with patient. Disposition Plan: SNF eventually but not yet ready for discharge.    Consultants:  Surgery  Heart Failure  Procedures:  HIDA scan Impression:  Normal hepatobiliary nuclear medicine scan demonstrating no evidence of cholecystitis or biliary obstruction. Ultrasound findings may have related to relative contraction of the gallbladder at the time of imaging   HPI/Subjective: She feels well. Denies worsening shortness of breath. No new complaints.  Objective: Filed Vitals:   04/15/14 0510  BP: 100/60  Pulse:   Temp:   Resp:     Intake/Output Summary (Last 24 hours) at 04/15/14 1127 Last data filed at 04/15/14 0913  Gross per 24 hour  Intake    600 ml  Output   1000 ml  Net   -400 ml   Filed Weights   04/13/14 1003 04/14/14 0510 04/15/14 0417  Weight: 79.153 kg (174 lb 8 oz) 79.379 kg (175 lb) 80.6 kg (177 lb 11.1 oz)    Exam:  General: Awake and alert, No distress  Cardiovascular: normal S1S2,  positive JVD. Systolic murmur over precordium  Respiratory:  Diminished air entry at bases. No crackles.   Abdomen: Soft, nondistended, NT, no  rebound tenderness  Musculoskeletal: trace edema.  Data Reviewed: Basic Metabolic Panel:  Recent Labs Lab 04/11/14 0516 04/12/14 0550 04/13/14 0530 04/14/14 0447 04/15/14 0404  NA 140 141 139 140 140  K 3.4* 3.8 3.4* 3.4* 4.5  CL 101 102 102 102 104  CO2 $Re'25 25 24 25 22  'CWx$ GLUCOSE 95 91 89 90 96  BUN 46* 40* 43* 45* 50*  CREATININE 1.71* 1.89* 1.81* 2.08* 2.26*  CALCIUM 8.8 8.9 8.6 8.7 9.2   CBC:  Recent Labs Lab 04/09/14 0424 04/13/14 0530  WBC 6.5 5.2  HGB 11.5* 10.7*  HCT 35.0* 33.0*  MCV 87.3 89.4  PLT 223 223   BNP (last 3 results)  Recent Labs  04/03/14 1056 04/05/14 1312 04/09/14 0424  PROBNP 19030.0* 5310.0* 3361.0*   Studies: No results found.  Scheduled Meds: . amiodarone  200 mg Oral BID  . atorvastatin  20 mg Oral Daily  . carvedilol  3.125 mg Oral BID WC  . colchicine  0.3 mg Oral Daily  . Febuxostat  1 tablet Oral Daily  . hydrALAZINE  25 mg Oral 3 times per day  . isosorbide mononitrate  60 mg Oral Daily  . sodium chloride  3 mL Intravenous Q12H  . sodium chloride  3 mL Intravenous Q12H  . Warfarin - Pharmacist Dosing Inpatient   Does not apply q1800   Continuous Infusions:    Principal Problem:   Acute on chronic systolic HF (heart failure) Active Problems:   Atrial fibrillation, chronic   Nonischemic cardiomyopathy   Hypertension   Anemia, normocytic normochromic   Chronic anticoagulation   Cholecystitis, acute   Acute renal failure superimposed on stage 3 chronic kidney disease   Acute on chronic systolic heart failure   Dyspnea   Time spent: 25 min   [facility] Pager [phone]   If 7PM-7AM, please contact night-coverage at [email or web address], password [facility] 04/15/2014, 11:27 AM  LOS: 12 days

## 2014-04-16 LAB — BASIC METABOLIC PANEL
Anion gap: 15 (ref 5–15)
BUN: 50 mg/dL — ABNORMAL HIGH (ref 6–23)
CO2: 21 mEq/L (ref 19–32)
Calcium: 9.4 mg/dL (ref 8.4–10.5)
Chloride: 103 mEq/L (ref 96–112)
Creatinine, Ser: 2.28 mg/dL — ABNORMAL HIGH (ref 0.50–1.10)
GFR, EST AFRICAN AMERICAN: 22 mL/min — AB (ref >90)
GFR, EST NON AFRICAN AMERICAN: 19 mL/min — AB (ref >90)
Glucose, Bld: 99 mg/dL (ref 70–99)
POTASSIUM: 4.7 meq/L (ref 3.7–5.3)
SODIUM: 139 meq/L (ref 137–147)

## 2014-04-16 LAB — PROTIME-INR
INR: 3.71 — AB (ref 0.00–1.49)
PROTHROMBIN TIME: 36.8 s — AB (ref 11.6–15.2)

## 2014-04-16 MED ORDER — FUROSEMIDE 80 MG PO TABS
80.0000 mg | ORAL_TABLET | Freq: Every day | ORAL | Status: DC
Start: 2014-04-16 — End: 2014-04-17
  Administered 2014-04-16: 80 mg via ORAL
  Filled 2014-04-16 (×2): qty 1

## 2014-04-16 NOTE — Progress Notes (Signed)
Pt refused PIV reinsertion, educated on the risks of infection.

## 2014-04-16 NOTE — Progress Notes (Signed)
Patient Name: [name] Date of Encounter: 04/16/2014  Principal Problem:   Acute on chronic systolic HF (heart failure) Active Problems:   Atrial fibrillation, chronic   Nonischemic cardiomyopathy   Hypertension   Anemia, normocytic normochromic   Chronic anticoagulation   Cholecystitis, acute   Acute renal failure superimposed on stage 3 chronic kidney disease   Acute on chronic systolic heart failure   Dyspnea   Length of Stay: 13  SUBJECTIVE  SOB at rest after bathing. Has not walked outside of hospital room yet. Denies orthopnea.  In/Out matched Creat same as yesterday  CURRENT MEDS . amiodarone  200 mg Oral BID  . atorvastatin  20 mg Oral Daily  . carvedilol  3.125 mg Oral BID WC  . colchicine  0.3 mg Oral Daily  . Febuxostat  1 tablet Oral Daily  . furosemide  80 mg Oral Daily  . hydrALAZINE  25 mg Oral 3 times per day  . isosorbide mononitrate  60 mg Oral Daily  . sodium chloride  3 mL Intravenous Q12H  . sodium chloride  3 mL Intravenous Q12H  . Warfarin - Pharmacist Dosing Inpatient   Does not apply q1800    OBJECTIVE   Intake/Output Summary (Last 24 hours) at 04/16/14 1022 Last data filed at 04/16/14 0918  Gross per 24 hour  Intake    720 ml  Output    650 ml  Net     70 ml   Filed Weights   04/14/14 0510 04/15/14 0417 04/16/14 0604  Weight: 175 lb (79.379 kg) 177 lb 11.1 oz (80.6 kg) 179 lb 0.2 oz (81.2 kg)    PHYSICAL EXAM Filed Vitals:   04/15/14 0510 04/15/14 1354 04/15/14 2108 04/16/14 0604  BP: 100/60 99/61 100/62 114/66  Pulse:  70 72 78  Temp:  97.5 F (36.4 C) 98 F (36.7 C) 97.8 F (36.6 C)  TempSrc:  Oral Oral Oral  Resp:  18 18   Height:      Weight:    179 lb 0.2 oz (81.2 kg)  SpO2:  100% 100% 100%   General: Alert, oriented x3, no distress  Head: no evidence of trauma, PERRL, EOMI, no exophtalmos or lid lag, no myxedema, no xanthelasma; normal ears, nose and oropharynx  Neck: normal jugular venous pulsations and  no hepatojugular reflux; brisk carotid pulses without delay and no carotid bruits  Chest: clear to auscultation, no signs of consolidation by percussion or palpation, normal fremitus, symmetrical and full respiratory excursions  Cardiovascular: normal position and quality of the apical impulse, regular rhythm, normal first and paradoxically split second heart sounds, + soft S3, no murmur  Abdomen: no tenderness or distention, no masses by palpation, no abnormal pulsatility or arterial bruits, normal bowel sounds, no hepatosplenomegaly  Extremities: no clubbing, cyanosis or edema; 2+ radial, ulnar and brachial pulses bilaterally; 2+ right femoral, posterior tibial and dorsalis pedis pulses; 2+ left femoral, posterior tibial and dorsalis pedis pulses; no subclavian or femoral bruits  Neurological: grossly nonfocal   LABS  CBC No results found for this basename: WBC, NEUTROABS, HGB, HCT, MCV, PLT,  in the last 72 hours Basic Metabolic Panel  Recent Labs  04/15/14 0404 04/16/14 0440  NA 140 139  K 4.5 4.7  CL 104 103  CO2 22 21  GLUCOSE 96 99  BUN 50* 50*  CREATININE 2.26* 2.28*  CALCIUM 9.2 9.4   Liver Function Tests No results found for this basename: AST, ALT, ALKPHOS, BILITOT, PROT, ALBUMIN,  in the last 72 hours No results found for this basename: LIPASE, AMYLASE,  in the last 72 hours  Radiology Studies Imaging results have been reviewed and No results found.  TELE V paced, afib  ASSESSMENT AND PLAN  CHF, combined systolic and diastolic, now NYHA class IIIb S/P AVN (re) ablation. Loading Amiodarone in anticipation of DCCV in 3-4 weeks, but likelihood of successful long term maintenance of SR is low. Weighs about 8 lb more than when she had RHC om 8/14 and PAWP was 12. Renal function has stopped worsening. Restart furosemide 80 mg daily. No K supplement yet. Review labs in AM.   Thurmon FairMihai Humphrey Guerreiro, MD, [facility] [phone] office [phone]  pager 04/16/2014 10:22 AM

## 2014-04-16 NOTE — Progress Notes (Signed)
PHARMACY NOTE  Pharmacy Consult :  80 y.o. female is currently on chronic Coumadin for atrial fibrillation .   Dosing Wt :  81.2 kg  Hematology :  Recent Labs  04/14/14 0447 04/15/14 0404 04/16/14 0440  LABPROT 30.0* 32.5* 36.8*  INR 2.86* 3.17* 3.71*  CREATININE 2.08* 2.26* 2.28*    Current Medication[s] Include: Medication PTA: Prescriptions prior to admission  Medication Sig Dispense Refill  . atorvastatin (LIPITOR) 20 MG tablet Take 20 mg by mouth daily.      . carvedilol (COREG) 25 MG tablet Take 25 mg by mouth 2 (two) times daily with a meal.      . colchicine 0.6 MG tablet Take 0.6 mg by mouth daily.      . Febuxostat (ULORIC) 80 MG TABS Take 1 tablet by mouth daily.      . furosemide (LASIX) 80 MG tablet Take 80 mg by mouth daily.      . hydrALAZINE (APRESOLINE) 100 MG tablet Take 100 mg by mouth 3 (three) times daily.       Marland Kitchen HYDROcodone-acetaminophen (NORCO) 7.5-325 MG per tablet Take 1 tablet by mouth every 4 (four) hours as needed for moderate pain.       . isosorbide mononitrate (IMDUR) 60 MG 24 hr tablet TAKE 2 TABLETS DAILY  180 tablet  2  . LANOXIN 125 MCG tablet TAKE 1 TABLET DAILY  90 tablet  1  . metFORMIN (GLUCOPHAGE) 500 MG tablet Take 500 mg by mouth 2 (two) times daily with a meal.      . metoprolol tartrate (LOPRESSOR) 25 MG tablet Take 1 tablet (25 mg total) by mouth 2 (two) times daily.  60 tablet  3  . Multiple Vitamin (MULTIVITAMIN) tablet Take 0.5 tablets by mouth daily.       . nitroGLYCERIN (NITROSTAT) 0.4 MG SL tablet Place 0.4 mg under the tongue every 5 (five) minutes as needed for chest pain.       . Omega-3 Fatty Acids (FISH OIL) 1000 MG CAPS Take 1 capsule by mouth daily.      . potassium chloride SA (K-DUR,KLOR-CON) 20 MEQ tablet Take 20-40 mEq by mouth 2 (two) times daily. Take 40 mg by mouth in the morning and 20 mg by mouth in the evening.      . warfarin (COUMADIN) 5 MG tablet Take 5-7.5 mg by mouth daily.  Take 7.5 mg by mouth on Thursday.  Take 5 mg by mouth on all other days.       Scheduled:  Scheduled:  . amiodarone  200 mg Oral BID  . atorvastatin  20 mg Oral Daily  . carvedilol  3.125 mg Oral BID WC  . colchicine  0.3 mg Oral Daily  . Febuxostat  1 tablet Oral Daily  . furosemide  80 mg Oral Daily  . hydrALAZINE  25 mg Oral 3 times per day  . isosorbide mononitrate  60 mg Oral Daily  . sodium chloride  3 mL Intravenous Q12H  . sodium chloride  3 mL Intravenous Q12H  . Warfarin - Pharmacist Dosing Inpatient   Does not apply q1800    Assessment :  Today's INR significantly up due to Amiodarone interaction with Coumadin.   INR  3.71.Marland Kitchen    No evidence of bleeding complications observed.  Goal :  INR goal is 2-3  Plan : No Coumadin dose today. Daily INR's.  Will check CBC. Monitor for bleeding complications.   Follow Platelet counts.  Laurena Bering,  Pharm.D  04/16/2014  12:11 PM

## 2014-04-16 NOTE — Progress Notes (Signed)
[facility] PROGRESS NOTE  [name] EPP:[id number] DOB: 1932-10-11 DOA: 04/03/2014  PCP: Lanette Hampshire, MD  Interim Summary Patient is a pleasant 80 year old female with a past medical history of chronic systolic congestive heart failure having an ejection fraction of 20-25%, atrial fibrillation currently on anticoagulation who presented to the emergency room at [facility] on 04/03/2014 complaining of shortness of breath, nausea vomiting and right upper quadrant pain. Initial workup in the emergency room included a chest x-ray that showed cardiomegaly without overt failure, BNP of 19,030, Creatinine of 2.07. She was also found to have an elevated bilirubin of 3.0 and alk phos of 122. RUQ US showing thickened gallbladder. She was transferred from [facility] to [facility] for surgical evaluation. Liver enzymes trending down. HIDA scan did not show evidence of cholecystitis or biliary obstruction. Heart failure team consulted. She underwent right side heart cath which cardiac out put low, well compensated filling pressure. She also underwent AV node ablation. She was on Milrinone Gtt for 24 hours. She was transitioned to oral Lasix. On 8/14 her creatinine was noted to climb and she had low BP. Diuretics were held per cardiology. Renal function stabilized. Diuretics resumed 8/16.  Assessment/Plan: 1. Acute on Chronic Combine Systolic and diastolic Congestive Heart Failure -Patient initially presented with signs and symptoms consistent with acute CHF.  -Transthoracic echocardiogram EF 15 to 20 %. Grade 3 diastolic dysfunction. -Patient S/P right side heart Cath, cardiac out put low, well compensated filling pressure.  -Patient S/P AV node ablation 8-7. -Started on Milrinone Gtt on 8-8.-- stopped 8-10.  -Due to Worsening dyspnea, restarted on IV lasix 8-11 and was transitioned to oral lasix starting 8/13.  -But due to worsening renal function lasix was stopped on 8/14.  Resumed 8/16 by cardiology.  2. Right Upper Quadrant Pain.  Improved.  -Initially there was concern for a cute cholecystitis with her u/s showing thickened gallbladder wal -She was seen and evaluated by surgery as a HIDA scan did not reveal evidence of cholecystitis.  -Liver enzymes trending down. -Suspect hepatic congestion from Heart failure.   3. Stage III chronic kidney disease with Acute worsening Creatinine continues to be higher than baseline but stable. Patient's baseline creatinine near 1.7-1.9. Lasix resumed.  4. Hypokalemia -Repleted.   5. History of Chronic Atrial Fibrillation -Presently rate controlled with heart rates in the 80s to 90s -Continue Carvedilol and Amiodarone -Digoxin discontinued.   6. Anticoagulation with Warfarin -Pharmacy managing warfarin  DVT Prophylaxis: On warfarin Code Status: Full Code Family Communication:  Care discussed with patient. Disposition Plan: SNF eventually but not yet ready for discharge.    Consultants:  Surgery  Heart Failure  Procedures:  HIDA scan Impression:  Normal hepatobiliary nuclear medicine scan demonstrating no evidence of cholecystitis or biliary obstruction. Ultrasound findings may have related to relative contraction of the gallbladder at the time of imaging   HPI/Subjective: She feels well. Denies worsening shortness of breath. No new complaints.  Objective: Filed Vitals:   04/16/14 0604  BP: 114/66  Pulse: 78  Temp: 97.8 F (36.6 C)  Resp:     Intake/Output Summary (Last 24 hours) at 04/16/14 1029 Last data filed at 04/16/14 0918  Gross per 24 hour  Intake    720 ml  Output    650 ml  Net     70 ml   Filed Weights   04/14/14 0510 04/15/14 0417 04/16/14 0604  Weight: 79.379 kg (175 lb) 80.6 kg (177 lb 11.1 oz)  81.2 kg (179 lb 0.2 oz)    Exam:   General: Awake and alert, No distress  Cardiovascular: normal S1S2,  positive JVD. Systolic murmur over precordium  Respiratory:  Good air  entry at bases today. No crackles.   Abdomen: Soft, nondistended, NT, no rebound tenderness  Musculoskeletal: trace edema.  Data Reviewed: Basic Metabolic Panel:  Recent Labs Lab 04/12/14 0550 04/13/14 0530 04/14/14 0447 04/15/14 0404 04/16/14 0440  NA 141 139 140 140 139  K 3.8 3.4* 3.4* 4.5 4.7  CL 102 102 102 104 103  CO2 _0 GLUCOSE 91 89 90 96 99  BUN 40* 43* 45* 50* 50*  CREATININE 1.89* 1.81* 2.08* 2.26* 2.28*  CALCIUM 8.9 8.6 8.7 9.2 9.4   CBC:  Recent Labs Lab 04/13/14 0530  WBC 5.2  HGB 10.7*  HCT 33.0*  MCV 89.4  PLT 223   BNP (last 3 results)  Recent Labs  04/03/14 1056 04/05/14 1312 04/09/14 0424  PROBNP 19030.0* 5310.0* 3361.0*   Studies: No results found.  Scheduled Meds: . amiodarone  200 mg Oral BID  . atorvastatin  20 mg Oral Daily  . carvedilol  3.125 mg Oral BID WC  . colchicine  0.3 mg Oral Daily  . Febuxostat  1 tablet Oral Daily  . furosemide  80 mg Oral Daily  . hydrALAZINE  25 mg Oral 3 times per day  . isosorbide mononitrate  60 mg Oral Daily  . sodium chloride  3 mL Intravenous Q12H  . sodium chloride  3 mL Intravenous Q12H  . Warfarin - Pharmacist Dosing Inpatient   Does not apply q1800   Continuous Infusions:    Principal Problem:   Acute on chronic systolic HF (heart failure) Active Problems:   Atrial fibrillation, chronic   Nonischemic cardiomyopathy   Hypertension   Anemia, normocytic normochromic   Chronic anticoagulation   Cholecystitis, acute   Acute renal failure superimposed on stage 3 chronic kidney disease   Acute on chronic systolic heart failure   Dyspnea   Time spent: 25 min   [place] [facility] Pager [phone]   If 7PM-7AM, please contact night-coverage at [email or web address], password [facility] 04/16/2014, 10:29 AM  LOS: 13 days

## 2014-04-17 LAB — BASIC METABOLIC PANEL
Anion gap: 14 (ref 5–15)
BUN: 50 mg/dL — ABNORMAL HIGH (ref 6–23)
CALCIUM: 8.9 mg/dL (ref 8.4–10.5)
CO2: 22 mEq/L (ref 19–32)
CREATININE: 2.4 mg/dL — AB (ref 0.50–1.10)
Chloride: 102 mEq/L (ref 96–112)
GFR calc non Af Amer: 18 mL/min — ABNORMAL LOW (ref >90)
GFR, EST AFRICAN AMERICAN: 21 mL/min — AB (ref >90)
Glucose, Bld: 90 mg/dL (ref 70–99)
Potassium: 3.9 mEq/L (ref 3.7–5.3)
Sodium: 138 mEq/L (ref 137–147)

## 2014-04-17 LAB — CBC
HEMATOCRIT: 31.6 % — AB (ref 36.0–46.0)
Hemoglobin: 10.5 g/dL — ABNORMAL LOW (ref 12.0–15.0)
MCH: 28.8 pg (ref 26.0–34.0)
MCHC: 33.2 g/dL (ref 30.0–36.0)
MCV: 86.8 fL (ref 78.0–100.0)
Platelets: 248 10*3/uL (ref 150–400)
RBC: 3.64 MIL/uL — ABNORMAL LOW (ref 3.87–5.11)
RDW: 14.3 % (ref 11.5–15.5)
WBC: 6 10*3/uL (ref 4.0–10.5)

## 2014-04-17 LAB — PROTIME-INR
INR: 3.64 — ABNORMAL HIGH (ref 0.00–1.49)
PROTHROMBIN TIME: 36.2 s — AB (ref 11.6–15.2)

## 2014-04-17 MED ORDER — MILRINONE IN DEXTROSE 20 MG/100ML IV SOLN
0.2500 ug/kg/min | INTRAVENOUS | Status: DC
  Administered 2014-04-17 – 2014-04-21 (×7): 0.25 ug/kg/min via INTRAVENOUS
  Filled 2014-04-17 (×8): qty 100

## 2014-04-17 MED ORDER — FUROSEMIDE 10 MG/ML IJ SOLN
80.0000 mg | Freq: Two times a day (BID) | INTRAMUSCULAR | Status: DC
  Administered 2014-04-17 – 2014-04-20 (×6): 80 mg via INTRAVENOUS
  Filled 2014-04-17 (×9): qty 8

## 2014-04-17 MED ORDER — COLCHICINE 0.6 MG PO TABS: 0.3000 mg | ORAL_TABLET | Freq: Every day | ORAL | Status: DC

## 2014-04-17 MED ORDER — HYDROCODONE-ACETAMINOPHEN 7.5-325 MG PO TABS: 1.0000 | ORAL_TABLET | ORAL | Status: DC | PRN

## 2014-04-17 MED ORDER — BISACODYL 10 MG RE SUPP: 10.0000 mg | Freq: Every day | RECTAL | Status: DC | PRN

## 2014-04-17 MED ORDER — ACETAMINOPHEN 650 MG RE SUPP: 650.0000 mg | Freq: Four times a day (QID) | RECTAL | Status: DC | PRN

## 2014-04-17 NOTE — Progress Notes (Signed)
[facility] PROGRESS NOTE  HEAVYN [name] LZJ:[id number] DOB: March 11, 1933 DOA: 04/03/2014  PCP: Lanette Hampshire, MD  Interim Summary Patient is a pleasant 80 year old female with a past medical history of chronic systolic congestive heart failure having an ejection fraction of 20-25%, atrial fibrillation currently on anticoagulation who presented to the emergency room at [facility] on 04/03/2014 complaining of shortness of breath, nausea vomiting and right upper quadrant pain. Initial workup in the emergency room included a chest x-ray that showed cardiomegaly without overt failure, BNP of 19,030, Creatinine of 2.07. She was also found to have an elevated bilirubin of 3.0 and alk phos of 122. RUQ US showing thickened gallbladder. She was transferred from [facility] to [facility] for surgical evaluation. Liver enzymes trending down. HIDA scan did not show evidence of cholecystitis or biliary obstruction. Heart failure team consulted. She underwent right side heart cath which cardiac out put low, well compensated filling pressure. She also underwent AV node ablation. She was on Milrinone Gtt for 24 hours. She was transitioned to oral Lasix. On 8/14 her creatinine was noted to climb and she had low BP. Diuretics were held per cardiology. Renal function stabilized. Diuretics resumed 8/16 but renal function getting worse. Plus she is gaining weight again. Milrinone to be reinitiated.  Assessment/Plan: 1. Acute on Chronic Combine Systolic and diastolic Congestive Heart Failure -Patient initially presented with signs and symptoms consistent with acute CHF.  -Transthoracic echocardiogram EF 15 to 20 %. Grade 3 diastolic dysfunction. -Patient S/P right side heart Cath, cardiac out put low, well compensated filling pressure.  -Patient S/P AV node ablation 8-7. -Started on Milrinone Gtt on 8-8.-- stopped 8-10.  -Due to Worsening dyspnea, restarted on IV lasix 8-11 and was transitioned  to oral lasix starting 8/13.  -But due to worsening renal function lasix was stopped on 8/14. Resumed 8/16 by cardiology. -Cardiology to resume Milrinone 8/17 as patient not doing well and gaining weight. She declines palliative care. Also changed to IV lasix on 8/17  2. Right Upper Quadrant Pain.  Improved.  -Initially there was concern for a cute cholecystitis with her u/s showing thickened gallbladder wal -She was seen and evaluated by surgery as a HIDA scan did not reveal evidence of cholecystitis.  -Liver enzymes trending down. -Suspect hepatic congestion from Heart failure.   3. Stage III chronic kidney disease with Acute worsening Creatinine continues to be higher than baseline. Patient's baseline creatinine near 1.7-1.9.   4. Hypokalemia -Repleted.   5. History of Chronic Atrial Fibrillation -Presently rate controlled with heart rates in the 80s to 90s -Continue Carvedilol and Amiodarone -Digoxin discontinued.   6. Anticoagulation with Warfarin -Pharmacy managing warfarin  7. Anemia on CKD Hgb stable  DVT Prophylaxis: On warfarin Code Status: Full Code Family Communication:  Care discussed with patient. Disposition Plan: Now back on Milrinone. Will be unable to go to SNF now.    Consultants:  Surgery  Heart Failure  Procedures:  HIDA scan Impression:  Normal hepatobiliary nuclear medicine scan demonstrating no evidence of cholecystitis or biliary obstruction. Ultrasound findings may have related to relative contraction of the gallbladder at the time of imaging   HPI/Subjective: She feels same. No new complaints.  Objective: Filed Vitals:   04/17/14 0610  BP: 123/56  Pulse: 80  Temp: 97.7 F (36.5 C)  Resp: 20    Intake/Output Summary (Last 24 hours) at 04/17/14 0953 Last data filed at 04/17/14 0600  Gross per 24 hour  Intake  480 ml  Output      1 ml  Net    479 ml   Filed Weights   04/15/14 0417 04/16/14 0604 04/17/14 0610  Weight: 80.6  kg (177 lb 11.1 oz) 81.2 kg (179 lb 0.2 oz) 81.784 kg (180 lb 4.8 oz)    Exam:   General: Awake and alert, No distress  Cardiovascular: normal S1S2,  positive JVD. Systolic murmur over precordium  Respiratory:  Good air entry at bases today. No crackles.   Abdomen: Soft, nondistended, NT, no rebound tenderness  Musculoskeletal: trace edema.  Data Reviewed: Basic Metabolic Panel:  Recent Labs Lab 04/13/14 0530 04/14/14 0447 04/15/14 0404 04/16/14 0440 04/17/14 0425  NA 139 140 140 139 138  K 3.4* 3.4* 4.5 4.7 3.9  CL 102 102 104 103 102  CO2 $Re'24 25 22 21 22  'VmD$ GLUCOSE 89 90 96 99 90  BUN 43* 45* 50* 50* 50*  CREATININE 1.81* 2.08* 2.26* 2.28* 2.40*  CALCIUM 8.6 8.7 9.2 9.4 8.9   CBC:  Recent Labs Lab 04/13/14 0530 04/17/14 0425  WBC 5.2 6.0  HGB 10.7* 10.5*  HCT 33.0* 31.6*  MCV 89.4 86.8  PLT 223 248   BNP (last 3 results)  Recent Labs  04/03/14 1056 04/05/14 1312 04/09/14 0424  PROBNP 19030.0* 5310.0* 3361.0*   Studies: No results found.  Scheduled Meds: . amiodarone  200 mg Oral BID  . atorvastatin  20 mg Oral Daily  . carvedilol  3.125 mg Oral BID WC  . colchicine  0.3 mg Oral Daily  . Febuxostat  1 tablet Oral Daily  . hydrALAZINE  25 mg Oral 3 times per day  . isosorbide mononitrate  60 mg Oral Daily  . sodium chloride  3 mL Intravenous Q12H  . sodium chloride  3 mL Intravenous Q12H  . Warfarin - Pharmacist Dosing Inpatient   Does not apply q1800   Continuous Infusions:    Principal Problem:   Acute on chronic systolic HF (heart failure) Active Problems:   Atrial fibrillation, chronic   Nonischemic cardiomyopathy   Hypertension   Anemia, normocytic normochromic   Chronic anticoagulation   Cholecystitis, acute   Acute renal failure superimposed on stage 3 chronic kidney disease   Acute on chronic systolic heart failure   Dyspnea    [name]  [facility] Pager [phone]   If 7PM-7AM, please contact night-coverage  at [email or web address], password [facility] 04/17/2014, 9:53 AM  LOS: 14 days

## 2014-04-17 NOTE — Progress Notes (Signed)
CSW received call from [facility] Admissions at [facility]. They had offered a SNF bed to patient and were awaiting medical stability and [facility] authorization.  Lynnea Ferrier stated that she received notification from [facility] representative that patient has been DENIED admission to SNF.  Thus, dc home will be only option for patient as she is unable to pay private pay rates.  Notified RNCM- Crystal of above.  CSW will discuss with patient and her husband in the morning and then sign off.  Lorri Frederick. Jaci Lazier, [place] [phone]

## 2014-04-17 NOTE — Progress Notes (Signed)
ANTICOAGULATION CONSULT NOTE - Follow-up Consult  Pharmacy Consult for Coumadin Indication: atrial fibrillation  Allergies  Allergen Reactions  . Tuberculin Tests Swelling    Patient Measurements: Height: 5\' 7"  (170.2 cm) Weight: 180 lb 4.8 oz (81.784 kg) (Scale C) IBW/kg (Calculated) : 61.6   Vital Signs: Temp: 97.7 F (36.5 C) (08/17 0610) Temp src: Oral (08/17 0610) BP: 123/56 mmHg (08/17 0610) Pulse Rate: 80 (08/17 0610)  Labs:  Recent Labs  04/15/14 0404 04/16/14 0440 04/17/14 0425  HGB  --   --  10.5*  HCT  --   --  31.6*  PLT  --   --  248  LABPROT 32.5* 36.8* 36.2*  INR 3.17* 3.71* 3.64*  CREATININE 2.26* 2.28* 2.40*    Estimated Creatinine Clearance: 21.3 ml/min (by C-G formula based on Cr of 2.4).   Assessment: 80 yo female transferred from [facility] 8/3 for surgical evaluation for possible cholecystitis. Coumadin for afib was on hold in case surgery needed. HIDA scan negative on 8/5.  INR remains SUPRAtherapeutic at 3.64 after held dose yesterday. Of note, patient was started on amiodarone on 8/10 which may potentiate the effects of Coumadin so may require lower dose. H/H low but relatively stable, plt wnl and stable with no reported s/s bleeding.  Home dose: 5mg  daily except for 7.5mg  on Thur. Last home dose 7/31.  Goal of Therapy:  INR: 2-3 Monitor platelets by anticoagulation protocol: Yes   Plan:  - Hold Coumadin again tonight - Continue daily INR - Monitor CBC, s/s bleeding  Margie Billet, PharmD Clinical Pharmacist - Resident Pager: [phone] Pharmacy: [phone] 04/17/2014 10:27 AM

## 2014-04-17 NOTE — Progress Notes (Signed)
DAILY PROGRESS NOTE  Subjective:   80 y/o with severe NICM and chronic AF s/p AV node ablation. Admitted with worsening HF. ICD interrogation showed 1/3 of ventricular beats actually conducted through AV node limiting BivPacing to 2/3 of time. Underwent repeat AV node ablation this admit. Also was on milrinone for brief time but stopped as she felt it didn't help.  RHC on 8/7 as below. Amio started with eye toward DC-CV in 4 weeks.   RA = 9  RV = 34/2/6  PA = 34/16 (24)  PCW = 12  Fick cardiac output/index = 3.6/1.8  PVR = 3.3 WU  FA sat = 98%  PA sat = 50%, 52%   Milrinone stopped last Monday. Weight and creatinine have trended up all weekend. Weight now back to admit weight.   Denies dizziness or SOB. Remains very weak.   Objective:  Temp:  [97 F (36.1 C)-97.7 F (36.5 C)] 97.7 F (36.5 C) (08/17 0610) Pulse Rate:  [72-80] 80 (08/17 0610) Resp:  [18-20] 20 (08/17 0610) BP: (101-123)/(55-61) 123/56 mmHg (08/17 0610) SpO2:  [100 %] 100 % (08/17 0610) Weight:  [81.784 kg (180 lb 4.8 oz)] 81.784 kg (180 lb 4.8 oz) (08/17 0610) Weight change: 0.583 kg (1 lb 4.6 oz)  Intake/Output from previous day: 08/16 0701 - 08/17 0700 In: 480 [P.O.:480] Out: 151 [Urine:150; Stool:1]  Intake/Output from this shift:    Medications: Current Facility-Administered Medications  Medication Dose Route Frequency Provider Last Rate Last Dose  . 0.9 %  sodium chloride infusion  250 mL Intravenous PRN Jolaine Artist, MD      . 0.9 %  sodium chloride infusion  250 mL Intravenous PRN Evans Lance, MD      . acetaminophen (TYLENOL) suppository 650 mg  650 mg Rectal Q6H PRN Radene Gunning, NP      . acetaminophen (TYLENOL) tablet 650 mg  650 mg Oral Q4H PRN Evans Lance, MD      . amiodarone (PACERONE) tablet 200 mg  200 mg Oral BID Jolaine Artist, MD   200 mg at 04/16/14 2131  . atorvastatin (LIPITOR) tablet 20 mg  20 mg Oral Daily Radene Gunning, NP   20 mg at 04/16/14 1141  .  bisacodyl (DULCOLAX) suppository 10 mg  10 mg Rectal Daily PRN Radene Gunning, NP      . carvedilol (COREG) tablet 3.125 mg  3.125 mg Oral BID WC Jolaine Artist, MD   3.125 mg at 04/16/14 1740  . colchicine tablet 0.3 mg  0.3 mg Oral Daily Kelvin Cellar, MD   0.3 mg at 04/16/14 1141  . Febuxostat TABS 80 mg  1 tablet Oral Daily Lezlie Octave Black, NP   80 mg at 04/16/14 1142  . hydrALAZINE (APRESOLINE) tablet 25 mg  25 mg Oral 3 times per day Jolaine Artist, MD   25 mg at 04/17/14 [phone]  . HYDROcodone-acetaminophen (NORCO) 7.5-325 MG per tablet 1 tablet  1 tablet Oral Q4H PRN Radene Gunning, NP   1 tablet at 04/07/14 1054  . isosorbide mononitrate (IMDUR) 24 hr tablet 60 mg  60 mg Oral Daily Radene Gunning, NP   60 mg at 04/16/14 1141  . nitroGLYCERIN (NITROSTAT) SL tablet 0.4 mg  0.4 mg Sublingual Q5 min PRN Radene Gunning, NP      . ondansetron [facility]) injection 4 mg  4 mg Intravenous Q6H PRN Evans Lance, MD   4 mg  at 04/13/14 1820  . sodium chloride 0.9 % injection 3 mL  3 mL Intravenous Q12H Jolaine Artist, MD   3 mL at 04/14/14 1046  . sodium chloride 0.9 % injection 3 mL  3 mL Intravenous PRN Jolaine Artist, MD      . sodium chloride 0.9 % injection 3 mL  3 mL Intravenous Q12H Evans Lance, MD   3 mL at 04/16/14 2200  . sodium chloride 0.9 % injection 3 mL  3 mL Intravenous PRN Evans Lance, MD      . Warfarin - Pharmacist Dosing Inpatient   Does not apply [place]        Physical Exam: General appearance: elderly female lying in bed NAD Neck: JVP to jaw no carotid bruit and supple, symmetrical, trachea midline Lungs: diminished breath sounds bilaterally. Heart:IRR. Prominent s3 Abdomen: soft,NT ND Extremities: warm no 1-2+ edema   Lab Results: Results for orders placed during the hospital encounter of 04/03/14 (from the past 48 hour(s))  PROTIME-INR     Status: Abnormal   Collection Time    04/16/14  4:40 AM      Result Value Ref Range    Prothrombin Time 36.8 (*) 11.6 - 15.2 seconds   INR 3.71 (*) 0.00 - 8.11  BASIC METABOLIC PANEL     Status: Abnormal   Collection Time    04/16/14  4:40 AM      Result Value Ref Range   Sodium 139  137 - 147 mEq/L   Potassium 4.7  3.7 - 5.3 mEq/L   Chloride 103  96 - 112 mEq/L   CO2 21  19 - 32 mEq/L   Glucose, Bld 99  70 - 99 mg/dL   BUN 50 (*) 6 - 23 mg/dL   Creatinine, Ser 2.28 (*) 0.50 - 1.10 mg/dL   Calcium 9.4  8.4 - 10.5 mg/dL   GFR calc non Af Amer 19 (*) >90 mL/min   GFR calc Af Amer 22 (*) >90 mL/min   Comment: (NOTE)     The eGFR has been calculated using the CKD EPI equation.     This calculation has not been validated in all clinical situations.     eGFR's persistently <90 mL/min signify possible Chronic Kidney     Disease.   Anion gap 15  5 - 15  PROTIME-INR     Status: Abnormal   Collection Time    04/17/14  4:25 AM      Result Value Ref Range   Prothrombin Time 36.2 (*) 11.6 - 15.2 seconds   INR 3.64 (*) 0.00 - 9.14  BASIC METABOLIC PANEL     Status: Abnormal   Collection Time    04/17/14  4:25 AM      Result Value Ref Range   Sodium 138  137 - 147 mEq/L   Potassium 3.9  3.7 - 5.3 mEq/L   Comment: DELTA CHECK NOTED     NO VISIBLE HEMOLYSIS   Chloride 102  96 - 112 mEq/L   CO2 22  19 - 32 mEq/L   Glucose, Bld 90  70 - 99 mg/dL   BUN 50 (*) 6 - 23 mg/dL   Creatinine, Ser 2.40 (*) 0.50 - 1.10 mg/dL   Calcium 8.9  8.4 - 10.5 mg/dL   GFR calc non Af Amer 18 (*) >90 mL/min   GFR calc Af Amer 21 (*) >90 mL/min   Comment: (NOTE)  The eGFR has been calculated using the CKD EPI equation.     This calculation has not been validated in all clinical situations.     eGFR's persistently <90 mL/min signify possible Chronic Kidney     Disease.   Anion gap 14  5 - 15  CBC     Status: Abnormal   Collection Time    04/17/14  4:25 AM      Result Value Ref Range   WBC 6.0  4.0 - 10.5 K/uL   RBC 3.64 (*) 3.87 - 5.11 MIL/uL   Hemoglobin 10.5 (*) 12.0 - 15.0 g/dL    HCT 31.6 (*) 36.0 - 46.0 %   MCV 86.8  78.0 - 100.0 fL   MCH 28.8  26.0 - 34.0 pg   MCHC 33.2  30.0 - 36.0 g/dL   RDW 14.3  11.5 - 15.5 %   Platelets 248  150 - 400 K/uL    Imaging: No results found.  Assessment:  1. A/c systolic HF d/t NICM 2. Cardiogenic shock 3. Acute on CKD stage IV 4. Chronic AF - s/p AV node ablation 5. Frequent PVCs  Plan:   She has failed milrinone wean. Weight and creatinine continue to rise after milrinone stopped even in face of volume being repleted. I discussed home inotropes vs palliative care and she has no interest in palliative care.   Will resume milrinone. This will preclude her from going to [facility]. Will likely need to [facility] with [facility] (or go to [facility]). If improves with milrinone will need PICC placed. Once milrinone in will need diuresis.   Overall, I am concerned that she is nearing end-stage HF with severe debility.  I think there is a small chance that restoration of sinus rhythm and abolition of PVCs may help her but it may not be enough. We are loading amio and tentatively planning DC-CV in 1 month. Continue coumadin  Stop carvedilol. Continue hydralazine/NTG. No ACE due to CKD.   Length of Stay:  LOS: 14 days   Glori Bickers MD 04/17/2014, 9:59 AM

## 2014-04-17 NOTE — Progress Notes (Signed)
04/17/2014 1357  Date Additional Medicare IM given: 04/17/2014 Additional Medicare IM given by:  Isidoro Donning Isidoro Donning RN CCM Case Mgmt phone [phone]

## 2014-04-18 LAB — FERRITIN: Ferritin: 109 ng/mL (ref 10–291)

## 2014-04-18 LAB — BASIC METABOLIC PANEL
ANION GAP: 13 (ref 5–15)
BUN: 49 mg/dL — AB (ref 6–23)
CALCIUM: 9.2 mg/dL (ref 8.4–10.5)
CO2: 25 meq/L (ref 19–32)
CREATININE: 2.32 mg/dL — AB (ref 0.50–1.10)
Chloride: 106 mEq/L (ref 96–112)
GFR calc Af Amer: 22 mL/min — ABNORMAL LOW (ref >90)
GFR calc non Af Amer: 19 mL/min — ABNORMAL LOW (ref >90)
Glucose, Bld: 86 mg/dL (ref 70–99)
Potassium: 4.2 mEq/L (ref 3.7–5.3)
Sodium: 144 mEq/L (ref 137–147)

## 2014-04-18 LAB — PROTIME-INR
INR: 3.26 — AB (ref 0.00–1.49)
PROTHROMBIN TIME: 33.2 s — AB (ref 11.6–15.2)

## 2014-04-18 LAB — IRON AND TIBC
Iron: 30 ug/dL — ABNORMAL LOW (ref 42–135)
Saturation Ratios: 11 % — ABNORMAL LOW (ref 20–55)
TIBC: 280 ug/dL (ref 250–470)
UIBC: 250 ug/dL (ref 125–400)

## 2014-04-18 MED ORDER — WARFARIN SODIUM 2.5 MG PO TABS
2.5000 mg | ORAL_TABLET | Freq: Once | ORAL | Status: AC
  Administered 2014-04-18: 2.5 mg via ORAL
  Filled 2014-04-18: qty 1

## 2014-04-18 MED ORDER — METOLAZONE 2.5 MG PO TABS
2.5000 mg | ORAL_TABLET | Freq: Every day | ORAL | Status: DC
  Administered 2014-04-18 – 2014-04-19 (×2): 2.5 mg via ORAL
  Filled 2014-04-18 (×3): qty 1

## 2014-04-18 NOTE — Progress Notes (Signed)
ANTICOAGULATION CONSULT NOTE - Follow-up Consult  Pharmacy Consult for Coumadin Indication: atrial fibrillation  Allergies  Allergen Reactions  . Tuberculin Tests Swelling    Patient Measurements: Height: 5\' 7"  (170.2 cm) Weight: 180 lb 1.9 oz (81.7 kg) IBW/kg (Calculated) : 61.6   Vital Signs: Temp: 98 F (36.7 C) (08/18 0725) Temp src: Oral (08/18 0725) BP: 111/61 mmHg (08/18 1029) Pulse Rate: 75 (08/18 1029)  Labs:  Recent Labs  04/16/14 0440 04/17/14 0425 04/18/14 0518  HGB  --  10.5*  --   HCT  --  31.6*  --   PLT  --  248  --   LABPROT 36.8* 36.2* 33.2*  INR 3.71* 3.64* 3.26*  CREATININE 2.28* 2.40* 2.32*    Estimated Creatinine Clearance: 22 ml/min (by C-G formula based on Cr of 2.32).   Assessment: 80 yo female transferred from [facility] 8/3 for surgical evaluation for possible cholecystitis. Coumadin for afib was on hold in case surgery needed. HIDA scan negative on 8/5.  INR remains SUPRAtherapeutic at 3.29 but has trended down after two held doses. Of note, patient was started on amiodarone on 8/10 which may potentiate the effects of Coumadin so may require lower dose. H/H low but relatively stable, plt wnl and stable with no reported s/s bleeding.  Home dose: 5mg  daily except for 7.5mg  on Thur. Last home dose 7/31.  Goal of Therapy:  INR: 2-3 Monitor platelets by anticoagulation protocol: Yes   Plan:  - Coumadin 2.5 mg PO x 1 tonight - If d/c home, recommend 2.5 mg PO daily with recheck INR within 1 week of d/c - Continue daily INR - Monitor CBC, s/s bleeding  Margie Billet, PharmD Clinical Pharmacist - Resident Pager: [phone] Pharmacy: [phone] 04/18/2014 10:51 AM

## 2014-04-18 NOTE — Progress Notes (Signed)
DAILY PROGRESS NOTE  Subjective:   80 y/o with severe NICM and chronic AF s/p AV node ablation. Admitted with worsening HF. ICD interrogation showed 1/3 of ventricular beats actually conducted through AV node limiting BivPacing to 2/3 of time. Underwent repeat AV node ablation this admit. Also was on milrinone for brief time but stopped as she felt it didn't help.  RHC on 8/7 as below. Amio started with eye toward DC-CV in 4 weeks.   RA = 9  RV = 34/2/6  PA = 34/16 (24)  PCW = 12  Fick cardiac output/index = 3.6/1.8  PVR = 3.3 WU  FA sat = 98%  PA sat = 50%, 52%   Milrinone restarted yesterday due to worsening renal and volume status. Feels better. Renal function improving. Denies dizziness or SOB. Remains very weak.   Objective:  Temp:  [97.6 F (36.4 C)-98 F (36.7 C)] 98 F (36.7 C) (08/18 0725) Pulse Rate:  [75-91] 75 (08/18 1029) Resp:  [17-18] 17 (08/18 0725) BP: (102-118)/(61-70) 111/61 mmHg (08/18 1029) SpO2:  [72 %-99 %] 72 % (08/18 0725) Weight:  [81.7 kg (180 lb 1.9 oz)] 81.7 kg (180 lb 1.9 oz) (08/18 0725) Weight change:   Intake/Output from previous day: 08/17 0701 - 08/18 0700 In: 1320 [P.O.:1320] Out: 1250 [Urine:1250]  Intake/Output from this shift: Total I/O In: -  Out: 1500 [Urine:1500]  Medications: Current Facility-Administered Medications  Medication Dose Route Frequency Provider Last Rate Last Dose  . 0.9 %  sodium chloride infusion  250 mL Intravenous PRN Jolaine Artist, MD      . 0.9 %  sodium chloride infusion  250 mL Intravenous PRN Evans Lance, MD      . acetaminophen (TYLENOL) suppository 650 mg  650 mg Rectal Q6H PRN Radene Gunning, NP      . acetaminophen (TYLENOL) tablet 650 mg  650 mg Oral Q4H PRN Evans Lance, MD      . amiodarone (PACERONE) tablet 200 mg  200 mg Oral BID Jolaine Artist, MD   200 mg at 04/18/14 1032  . atorvastatin (LIPITOR) tablet 20 mg  20 mg Oral Daily Radene Gunning, NP   20 mg at 04/18/14 1032   . bisacodyl (DULCOLAX) suppository 10 mg  10 mg Rectal Daily PRN Lezlie Octave Black, NP      . colchicine tablet 0.3 mg  0.3 mg Oral Daily Kelvin Cellar, MD   0.3 mg at 04/18/14 1031  . Febuxostat TABS 80 mg  1 tablet Oral Daily Lezlie Octave Black, NP   80 mg at 04/18/14 1031  . furosemide (LASIX) injection 80 mg  80 mg Intravenous BID Jolaine Artist, MD   80 mg at 04/18/14 0748  . hydrALAZINE (APRESOLINE) tablet 25 mg  25 mg Oral 3 times per day Jolaine Artist, MD   25 mg at 04/18/14 0744  . HYDROcodone-acetaminophen (NORCO) 7.5-325 MG per tablet 1 tablet  1 tablet Oral Q4H PRN Radene Gunning, NP   1 tablet at 04/07/14 1054  . isosorbide mononitrate (IMDUR) 24 hr tablet 60 mg  60 mg Oral Daily Radene Gunning, NP   60 mg at 04/18/14 1032  . milrinone (PRIMACOR) 20 MG/100ML (0.2 mg/mL) infusion  0.25 mcg/kg/min Intravenous Continuous Shaune Pascal Colbie Sliker, MD 6.1 mL/hr at 04/18/14 0225 0.25 mcg/kg/min at 04/18/14 0225  . nitroGLYCERIN (NITROSTAT) SL tablet 0.4 mg  0.4 mg Sublingual Q5 min PRN Radene Gunning, NP      .  ondansetron (ZOFRAN) injection 4 mg  4 mg Intravenous Q6H PRN Gregg W Taylor, MD   4 mg at 04/13/14 1820  . sodium chloride 0.9 % injection 3 mL  3 mL Intravenous Q12H Daniel R Bensimhon, MD   3 mL at 04/14/14 1046  . sodium chloride 0.9 % injection 3 mL  3 mL Intravenous PRN Daniel R Bensimhon, MD      . sodium chloride 0.9 % injection 3 mL  3 mL Intravenous Q12H Gregg W Taylor, MD   3 mL at 04/16/14 2200  . sodium chloride 0.9 % injection 3 mL  3 mL Intravenous PRN Gregg W Taylor, MD      . warfarin (COUMADIN) tablet 2.5 mg  2.5 mg Oral ONCE-1800 Erika K von Vajna, RPH      . Warfarin - Pharmacist Dosing Inpatient   Does not apply q1800 Caron George Amend, RPH        Physical Exam: General appearance: elderly female lying in bed NAD Neck: JVP to jaw no carotid bruit and supple, symmetrical, trachea midline Lungs: diminished breath sounds bilaterally. Heart:IRR. Prominent s3 Abdomen:  soft,NT ND Extremities: warm no 1-2+ edema   Lab Results: Results for orders placed during the hospital encounter of 04/03/14 (from the past 48 hour(s))  PROTIME-INR     Status: Abnormal   Collection Time    04/17/14  4:25 AM      Result Value Ref Range   Prothrombin Time 36.2 (*) 11.6 - 15.2 seconds   INR 3.64 (*) 0.00 - 1.49  BASIC METABOLIC PANEL     Status: Abnormal   Collection Time    04/17/14  4:25 AM      Result Value Ref Range   Sodium 138  137 - 147 mEq/L   Potassium 3.9  3.7 - 5.3 mEq/L   Comment: DELTA CHECK NOTED     NO VISIBLE HEMOLYSIS   Chloride 102  96 - 112 mEq/L   CO2 22  19 - 32 mEq/L   Glucose, Bld 90  70 - 99 mg/dL   BUN 50 (*) 6 - 23 mg/dL   Creatinine, Ser 2.40 (*) 0.50 - 1.10 mg/dL   Calcium 8.9  8.4 - 10.5 mg/dL   GFR calc non Af Amer 18 (*) >90 mL/min   GFR calc Af Amer 21 (*) >90 mL/min   Comment: (NOTE)     The eGFR has been calculated using the CKD EPI equation.     This calculation has not been validated in all clinical situations.     eGFR's persistently <90 mL/min signify possible Chronic Kidney     Disease.   Anion gap 14  5 - 15  CBC     Status: Abnormal   Collection Time    04/17/14  4:25 AM      Result Value Ref Range   WBC 6.0  4.0 - 10.5 K/uL   RBC 3.64 (*) 3.87 - 5.11 MIL/uL   Hemoglobin 10.5 (*) 12.0 - 15.0 g/dL   HCT 31.6 (*) 36.0 - 46.0 %   MCV 86.8  78.0 - 100.0 fL   MCH 28.8  26.0 - 34.0 pg   MCHC 33.2  30.0 - 36.0 g/dL   RDW 14.3  11.5 - 15.5 %   Platelets 248  150 - 400 K/uL  PROTIME-INR     Status: Abnormal   Collection Time    04/18/14  5:18 AM      Result Value Ref Range     Prothrombin Time 33.2 (*) 11.6 - 15.2 seconds   INR 3.26 (*) 0.00 - 1.49  BASIC METABOLIC PANEL     Status: Abnormal   Collection Time    04/18/14  5:18 AM      Result Value Ref Range   Sodium 144  137 - 147 mEq/L   Potassium 4.2  3.7 - 5.3 mEq/L   Chloride 106  96 - 112 mEq/L   CO2 25  19 - 32 mEq/L   Glucose, Bld 86  70 - 99 mg/dL    BUN 49 (*) 6 - 23 mg/dL   Creatinine, Ser 2.32 (*) 0.50 - 1.10 mg/dL   Calcium 9.2  8.4 - 10.5 mg/dL   GFR calc non Af Amer 19 (*) >90 mL/min   GFR calc Af Amer 22 (*) >90 mL/min   Comment: (NOTE)     The eGFR has been calculated using the CKD EPI equation.     This calculation has not been validated in all clinical situations.     eGFR's persistently <90 mL/min signify possible Chronic Kidney     Disease.   Anion gap 13  5 - 15  FERRITIN     Status: None   Collection Time    04/18/14  5:18 AM      Result Value Ref Range   Ferritin 109  10 - 291 ng/mL   Comment: Performed at [facility]    Imaging: No results found.  Assessment:  1. A/c systolic HF d/t NICM 2. Cardiogenic shock 3. Acute on CKD stage IV 4. Chronic AF - s/p AV node ablation 5. Frequent PVCs  Plan:   She has failed milrinone wean. She will need home milrinone. Will have PICC placed and resume IV diuresis. hoepfully home in a few days. I discussed home inotropes vs palliative care and she has no interest in palliative care.   Will resume milrinone. This will preclude her from going to [facility]. Will likely need to go home with HHRN (or go to [facility]). If improves with milrinone will need PICC placed. Once milrinone in will need diuresis.    I think there is a small chance that restoration of sinus rhythm and abolition of PVCs may help her but it may not be enough. We are loading amio and tentatively planning DC-CV in 1 month. Continue coumadin  No carvedilol due to low output. Continue hydralazine/NTG. No ACE due to CKD.   Length of Stay:  LOS: 15 days   Daniel Bensimhon MD 04/18/2014, 1:23 PM     

## 2014-04-18 NOTE — Progress Notes (Signed)
UR completed Nesha Counihan K. Livy Ross, RN, BSN, MSHL, CCM  04/18/2014 2:50 PM

## 2014-04-18 NOTE — Progress Notes (Signed)
Physical Therapy Treatment Patient Details Name: [name] MRN: [id number] DOB: 03/17/1933 Today's Date: 04/18/2014    History of Present Illness Pt is a 80 y/o female admitted s/p complaints of nausea, vomiting, abdominal pain. Ultrasound of right upper quadrant indicates thickened gallbladder. Pt was admitted for surgical evaluation for cholecystitis.    PT Comments    Pt progressing slowly towards physical therapy goals. Therapist providing encouragement for participation throughout session, however pt declining further gait training, sitting in recliner, and performing therapeutic exercise. Therapist issued and discussed HEP with pt again. Noted pt's insurance declined approval for SNF and pt will be returning home. Unable to perform stair training today, however pt adamant that she will have no problems negotiating the stairs at home. Would like her to practice at least once prior to d/c.   Follow Up Recommendations  SNF;Supervision/Assistance - 24 hour     Equipment Recommendations  Rolling walker with 5" wheels    Recommendations for Other Services OT consult     Precautions / Restrictions Precautions Precautions: Fall Restrictions Weight Bearing Restrictions: No    Mobility  Bed Mobility Overal bed mobility: Needs Assistance Bed Mobility: Supine to Sit;Sit to Supine     Supine to sit: Supervision Sit to supine: Supervision   General bed mobility comments: Requires increased time and supervision for safety.  Transfers Overall transfer level: Needs assistance Equipment used: Rolling walker (2 wheeled) Transfers: Sit to/from [employer] CorporationStand;Stand Pivot Transfers Sit to Stand: Min guard Stand pivot transfers: Min guard       General transfer comment: VC's for hand placement on seated surface for safety. Pt [facility] Appeared somewhat unsteady with transfer, however no physical assist was required.   Ambulation/Gait Ambulation/Gait  assistance: Min guard Ambulation Distance (Feet): 100 Feet Assistive device: Rolling walker (2 wheeled) Gait Pattern/deviations: Step-through pattern;Decreased stride length;Trunk flexed Gait velocity: Decreased Gait velocity interpretation: Below normal speed for age/gender General Gait Details: Verbal cues to stand upright and look ahead during gait.  Patient with very slow deliberate gait. Therapist encouraging pt to increase gait distance, however pt adamantly declines.    Stairs            Wheelchair Mobility    Modified Rankin (Stroke Patients Only)       Balance Overall balance assessment: Needs assistance Sitting-balance support: Feet supported;No upper extremity supported Sitting balance-Leahy Scale: Good     Standing balance support: During functional activity;No upper extremity supported Standing balance-Leahy Scale: Fair Standing balance comment: Able to stand and perform SPT with no physical assist.                     Cognition Arousal/Alertness: Awake/alert Behavior During Therapy: WFL for tasks assessed/performed Overall Cognitive Status: Within Functional Limits for tasks assessed                      Exercises      General Comments General comments (skin integrity, edema, etc.): Issued second HEP as pt does not know where first handout is. Discussed technique, sets, reps for all.       Pertinent Vitals/Pain Pain Assessment: No/denies pain    Home Living                      Prior Function            PT Goals (current goals can now be found in the care plan section) Acute  Rehab PT Goals Patient Stated Goal: To return to her home independently PT Goal Formulation: With patient/family Time For Goal Achievement: 04/19/14 Potential to Achieve Goals: Good Progress towards PT goals: Progressing toward goals    Frequency  Min 2X/week    PT Plan Current plan remains appropriate    Co-evaluation              End of Session Equipment Utilized During Treatment: Gait belt Activity Tolerance: Patient limited by fatigue Patient left: in bed;with call bell/phone within reach     Time: 1145-1206 PT Time Calculation (min): 21 min  Charges:  $Gait Training: 8-22 mins                    G Codes:      Ruthann Cancer 04-27-2014, 12:18 PM  Ruthann Cancer, PT, DPT Acute Rehabilitation Services Pager: [phone]

## 2014-04-18 NOTE — Progress Notes (Signed)
[facility] PROGRESS NOTE  [name] VPX:[id number] DOB: December 09, 1932 DOA: 04/03/2014  PCP: Brandi Hampshire, MD  Interim Summary Patient is a pleasant 80 year old female with a past medical history of chronic systolic congestive heart failure having an ejection fraction of 20-25%, atrial fibrillation currently on anticoagulation who presented to the emergency room at [facility] on 04/03/2014 complaining of shortness of breath, nausea vomiting and right upper quadrant pain. Initial workup in the emergency room included a chest x-ray that showed cardiomegaly without overt failure, BNP of 19,030, Creatinine of 2.07. She was also found to have an elevated bilirubin of 3.0 and alk phos of 122. RUQ US showing thickened gallbladder. She was transferred from [facility] to [facility] for surgical evaluation. Liver enzymes trending down. HIDA scan did not show evidence of cholecystitis or biliary obstruction. Heart failure team consulted. She underwent right side heart cath which cardiac out put low, well compensated filling pressure. She also underwent AV node ablation. She was on Milrinone Gtt for 24 hours. She was transitioned to oral Lasix. On 8/14 her creatinine was noted to climb and she had low BP. Diuretics were held per cardiology. Renal function stabilized. Diuretics resumed 8/16 but renal function getting worse. Plus she is gaining weight again. Milrinone was reinitiated 8/17.  Assessment/Plan: 1. Acute on Chronic Combine Systolic and diastolic Congestive Heart Failure -Patient initially presented with signs and symptoms consistent with acute CHF.  -Transthoracic echocardiogram EF 15 to 20 %. Grade 3 diastolic dysfunction. -Patient S/P right side heart Cath, cardiac out put low, well compensated filling pressure.  -Patient S/P AV node ablation 8-7. -Started on Milrinone Gtt on 8-8.-- stopped 8-10.  -Due to Worsening dyspnea, restarted on IV lasix 8-11 and was  transitioned to oral lasix starting 8/13.  -But due to worsening renal function lasix was stopped on 8/14. Resumed 8/16 by cardiology. -Cardiology restarted Milrinone 8/17 as patient not doing well and gaining weight. She declines palliative care. Also changed to IV lasix on 8/17.  2. Right Upper Quadrant Pain.  Improved.  -Initially there was concern for a cute cholecystitis with her u/s showing thickened gallbladder wal -She was seen and evaluated by surgery as a HIDA scan did not reveal evidence of cholecystitis.  -Liver enzymes trending down. -Suspect hepatic congestion from Heart failure.   3. Stage III chronic kidney disease with Acute worsening Creatinine continues to be higher than baseline. Patient's baseline creatinine near 1.7-1.9.   4. Hypokalemia -Repleted.   5. History of Chronic Atrial Fibrillation -Presently rate controlled with heart rates in the 80s to 90s -Continue Carvedilol and Amiodarone -Digoxin discontinued.   6. Anticoagulation with Warfarin -Pharmacy managing warfarin  7. Anemia on CKD Hgb stable  DVT Prophylaxis: On warfarin Code Status: Full Code Family Communication:  Care discussed with patient. Disposition Plan: Now back on Milrinone. Will be unable to go to SNF now. Disposition per cardiology.   Consultants:  Surgery  Heart Failure  Procedures:  HIDA scan Impression: Normal hepatobiliary nuclear medicine scan demonstrating no evidence of cholecystitis or biliary obstruction. Ultrasound findings may have related to relative contraction of the gallbladder at the time of imaging   HPI/Subjective: She denies complaints. Breathing is better.  Objective: Filed Vitals:   04/18/14 0725  BP: 118/69  Pulse: 91  Temp: 98 F (36.7 C)  Resp: 17    Intake/Output Summary (Last 24 hours) at 04/18/14 1022 Last data filed at 04/17/14 2213  Gross per 24 hour  Intake  1080 ml  Output   1250 ml  Net   -170 ml   Filed Weights   04/16/14  0604 04/17/14 0610 04/18/14 0725  Weight: 81.2 kg (179 lb 0.2 oz) 81.784 kg (180 lb 4.8 oz) 81.7 kg (180 lb 1.9 oz)    Exam:   General: Awake and alert, No distress  Cardiovascular: normal S1S2,  positive JVD. Systolic murmur over precordium  Respiratory:  Good air entry at bases today. No crackles.   Abdomen: Soft, nondistended, NT. BS present  Musculoskeletal: trace edema.  Data Reviewed: Basic Metabolic Panel:  Recent Labs Lab 04/14/14 0447 04/15/14 0404 04/16/14 0440 04/17/14 0425 04/18/14 0518  NA 140 140 139 138 144  K 3.4* 4.5 4.7 3.9 4.2  CL 102 104 103 102 106  CO2 $Re'25 22 21 22 25  'Dhz$ GLUCOSE 90 96 99 90 86  BUN 45* 50* 50* 50* 49*  CREATININE 2.08* 2.26* 2.28* 2.40* 2.32*  CALCIUM 8.7 9.2 9.4 8.9 9.2   CBC:  Recent Labs Lab 04/13/14 0530 04/17/14 0425  WBC 5.2 6.0  HGB 10.7* 10.5*  HCT 33.0* 31.6*  MCV 89.4 86.8  PLT 223 248   BNP (last 3 results)  Recent Labs  04/03/14 1056 04/05/14 1312 04/09/14 0424  PROBNP 19030.0* 5310.0* 3361.0*   Studies: No results found.  Scheduled Meds: . amiodarone  200 mg Oral BID  . atorvastatin  20 mg Oral Daily  . colchicine  0.3 mg Oral Daily  . Febuxostat  1 tablet Oral Daily  . furosemide  80 mg Intravenous BID  . hydrALAZINE  25 mg Oral 3 times per day  . isosorbide mononitrate  60 mg Oral Daily  . sodium chloride  3 mL Intravenous Q12H  . sodium chloride  3 mL Intravenous Q12H  . Warfarin - Pharmacist Dosing Inpatient   Does not apply q1800   Continuous Infusions: . milrinone 0.25 mcg/kg/min (04/18/14 0225)    Principal Problem:   Acute on chronic systolic HF (heart failure) Active Problems:   Atrial fibrillation, chronic   Nonischemic cardiomyopathy   Hypertension   Anemia, normocytic normochromic   Chronic anticoagulation   Cholecystitis, acute   Acute renal failure superimposed on stage 3 chronic kidney disease   Acute on chronic systolic heart failure    Dyspnea    [name]  [facility] Pager [phone]   If 7PM-7AM, please contact night-coverage at [email or web address], password [facility] 04/18/2014, 10:22 AM  LOS: 15 days

## 2014-04-19 ENCOUNTER — Inpatient Hospital Stay (HOSPITAL_COMMUNITY): Payer: Medicare HMO

## 2014-04-19 LAB — BASIC METABOLIC PANEL
ANION GAP: 16 — AB (ref 5–15)
BUN: 44 mg/dL — ABNORMAL HIGH (ref 6–23)
CALCIUM: 9.2 mg/dL (ref 8.4–10.5)
CO2: 25 meq/L (ref 19–32)
CREATININE: 2.09 mg/dL — AB (ref 0.50–1.10)
Chloride: 98 mEq/L (ref 96–112)
GFR, EST AFRICAN AMERICAN: 25 mL/min — AB (ref >90)
GFR, EST NON AFRICAN AMERICAN: 22 mL/min — AB (ref >90)
Glucose, Bld: 94 mg/dL (ref 70–99)
Potassium: 3.1 mEq/L — ABNORMAL LOW (ref 3.7–5.3)
SODIUM: 139 meq/L (ref 137–147)

## 2014-04-19 LAB — PROTIME-INR
INR: 2.56 — ABNORMAL HIGH (ref 0.00–1.49)
PROTHROMBIN TIME: 27.5 s — AB (ref 11.6–15.2)

## 2014-04-19 MED ORDER — SODIUM CHLORIDE 0.9 % IV SOLN
510.0000 mg | Freq: Once | INTRAVENOUS | Status: AC
  Administered 2014-04-19: 510 mg via INTRAVENOUS
  Filled 2014-04-19: qty 17

## 2014-04-19 MED ORDER — SODIUM CHLORIDE 0.9 % IJ SOLN
10.0000 mL | INTRAMUSCULAR | Status: DC | PRN
  Administered 2014-04-20 – 2014-04-21 (×3): 10 mL

## 2014-04-19 MED ORDER — POTASSIUM CHLORIDE CRYS ER 20 MEQ PO TBCR
40.0000 meq | EXTENDED_RELEASE_TABLET | Freq: Once | ORAL | Status: AC
  Administered 2014-04-19: 40 meq via ORAL
  Filled 2014-04-19: qty 2

## 2014-04-19 MED ORDER — WARFARIN SODIUM 2.5 MG PO TABS
2.5000 mg | ORAL_TABLET | Freq: Once | ORAL | Status: AC
  Administered 2014-04-19: 2.5 mg via ORAL
  Filled 2014-04-19: qty 1

## 2014-04-19 NOTE — Progress Notes (Signed)
Peripherally Inserted Central Catheter/Midline Placement  The IV Nurse has discussed with the patient and/or persons authorized to consent for the patient, the purpose of this procedure and the potential benefits and risks involved with this procedure.  The benefits include less needle sticks, lab draws from the catheter and patient may be discharged home with the catheter.  Risks include, but not limited to, infection, bleeding, blood clot (thrombus formation), and puncture of an artery; nerve damage and irregular heat beat.  Alternatives to this procedure were also discussed.  PICC/Midline Placement Documentation  PICC / Midline Double Lumen 04/19/14 PICC Right Brachial 38 cm 0 cm (Active)  Indication for Insertion or Continuance of Line Home intravenous therapies (PICC only) 04/19/2014 11:00 AM  Exposed Catheter (cm) 0 cm 04/19/2014 11:00 AM  Dressing Change Due 04/26/14 04/19/2014 11:00 AM       Stacie Glaze Horton 04/19/2014, 11:31 AM

## 2014-04-19 NOTE — Progress Notes (Signed)
ANTICOAGULATION CONSULT NOTE - Follow-up Consult  Pharmacy Consult for Coumadin Indication: atrial fibrillation  Allergies  Allergen Reactions  . Tuberculin Tests Swelling    Patient Measurements: Height: 5\' 7"  (170.2 cm) Weight: 173 lb 8 oz (78.699 kg) (scale C) IBW/kg (Calculated) : 61.6   Vital Signs: Temp: 98.2 F (36.8 C) (08/19 0433) Temp src: Oral (08/19 0433) BP: 112/55 mmHg (08/19 0657) Pulse Rate: 75 (08/19 0657)  Labs:  Recent Labs  04/17/14 0425 04/18/14 0518 04/19/14 0411  HGB 10.5*  --   --   HCT 31.6*  --   --   PLT 248  --   --   LABPROT 36.2* 33.2* 27.5*  INR 3.64* 3.26* 2.56*  CREATININE 2.40* 2.32* 2.09*    Estimated Creatinine Clearance: 24 ml/min (by C-G formula based on Cr of 2.09).   Assessment: 80 yo female transferred [facility] 8[facility] for surgical evaluation for possible cholecystitis. Coumadin for afib was on hold in case surgery needed. HIDA scan negative on 8/5.  INR is now therapeutic after two held doses and decreased dose given yesterday. Of note, patient was started on amiodarone on 8/10 which may potentiate the effects of Coumadin so may require lower dose. H/H low but relatively stable, plt wnl and stable with no reported s/s bleeding.  Home dose: 5mg  daily except for 7.5mg  on Thur. Last home dose 7/31.  Goal of Therapy:  INR: 2-3 Monitor platelets by anticoagulation protocol: Yes   Plan:  - Coumadin 2.5 mg PO x 1 tonight - Continue daily INR - Monitor CBC, s/s bleeding  Margie Billet, PharmD Clinical Pharmacist - Resident Pager: [phone] Pharmacy: [phone] 04/19/2014 11:12 AM

## 2014-04-19 NOTE — Progress Notes (Signed)
DAILY PROGRESS NOTE  Subjective:   80 y/o with severe NICM and chronic AF s/p AV node ablation. Admitted with worsening HF. ICD interrogation showed 1/3 of ventricular beats actually conducted through AV node limiting BivPacing to 2/3 of time. Underwent repeat AV node ablation this admit. Also was on milrinone for brief time but stopped as she felt it didn't help.  RHC on 8/7 as below. Amio started with eye toward DC-CV in 4 weeks.   RA = 9  RV = 34/2/6  PA = 34/16 (24)  PCW = 12  Fick cardiac output/index = 3.6/1.8  PVR = 3.3 WU  FA sat = 98%  PA sat = 50%, 52%   Milrinone restarted 8/17 due to worsening renal and volume status. Feels better. Renal function improving. Diuresing very well. K low.    Objective:  Temp:  [97.6 F (36.4 C)-98.2 F (36.8 C)] 98.2 F (36.8 C) (08/19 0433) Pulse Rate:  [72-76] 75 (08/19 0657) Resp:  [18-20] 18 (08/19 0433) BP: (109-123)/(50-65) 112/55 mmHg (08/19 0657) SpO2:  [99 %-100 %] 99 % (08/19 0433) Weight:  [78.699 kg (173 lb 8 oz)] 78.699 kg (173 lb 8 oz) (08/19 0433) Weight change:   Intake/Output from previous day: 08/18 0701 - 08/19 0700 In: 1525.3 [P.O.:682; I.V.:843.3] Out: 5400 [Urine:5400]  Intake/Output from this shift:    Medications: Current Facility-Administered Medications  Medication Dose Route Frequency Provider Last Rate Last Dose  . 0.9 %  sodium chloride infusion  250 mL Intravenous PRN Jolaine Artist, MD      . 0.9 %  sodium chloride infusion  250 mL Intravenous PRN Evans Lance, MD      . acetaminophen (TYLENOL) suppository 650 mg  650 mg Rectal Q6H PRN Radene Gunning, NP      . acetaminophen (TYLENOL) tablet 650 mg  650 mg Oral Q4H PRN Evans Lance, MD      . amiodarone (PACERONE) tablet 200 mg  200 mg Oral BID Jolaine Artist, MD   200 mg at 04/18/14 2100  . atorvastatin (LIPITOR) tablet 20 mg  20 mg Oral Daily Radene Gunning, NP   20 mg at 04/18/14 1032  . bisacodyl (DULCOLAX) suppository 10 mg   10 mg Rectal Daily PRN Lezlie Octave Black, NP      . colchicine tablet 0.3 mg  0.3 mg Oral Daily Kelvin Cellar, MD   0.3 mg at 04/18/14 1031  . Febuxostat TABS 80 mg  1 tablet Oral Daily Lezlie Octave Black, NP   80 mg at 04/18/14 1031  . furosemide (LASIX) injection 80 mg  80 mg Intravenous BID Jolaine Artist, MD   80 mg at 04/18/14 1817  . hydrALAZINE (APRESOLINE) tablet 25 mg  25 mg Oral 3 times per day Jolaine Artist, MD   25 mg at 04/19/14 0655  . HYDROcodone-acetaminophen (NORCO) 7.5-325 MG per tablet 1 tablet  1 tablet Oral Q4H PRN Radene Gunning, NP   1 tablet at 04/07/14 1054  . isosorbide mononitrate (IMDUR) 24 hr tablet 60 mg  60 mg Oral Daily Radene Gunning, NP   60 mg at 04/18/14 1032  . metolazone (ZAROXOLYN) tablet 2.5 mg  2.5 mg Oral Daily Jolaine Artist, MD   2.5 mg at 04/18/14 1425  . milrinone (PRIMACOR) 20 MG/100ML (0.2 mg/mL) infusion  0.25 mcg/kg/min Intravenous Continuous Jolaine Artist, MD 6.1 mL/hr at 04/18/14 2042 0.25 mcg/kg/min at 04/18/14 2042  . nitroGLYCERIN (NITROSTAT)  SL tablet 0.4 mg  0.4 mg Sublingual Q5 min PRN Radene Gunning, NP      . ondansetron [facility]) injection 4 mg  4 mg Intravenous Q6H PRN Evans Lance, MD   4 mg at 04/13/14 1820  . potassium chloride SA (K-DUR,KLOR-CON) CR tablet 40 mEq  40 mEq Oral Once Modena Jansky, MD      . sodium chloride 0.9 % injection 3 mL  3 mL Intravenous Q12H Jolaine Artist, MD   3 mL at 04/14/14 1046  . sodium chloride 0.9 % injection 3 mL  3 mL Intravenous PRN Jolaine Artist, MD      . sodium chloride 0.9 % injection 3 mL  3 mL Intravenous Q12H Evans Lance, MD   3 mL at 04/16/14 2200  . sodium chloride 0.9 % injection 3 mL  3 mL Intravenous PRN Evans Lance, MD      . Warfarin - Pharmacist Dosing Inpatient   Does not apply [place]        Physical Exam: General appearance: elderly female lying in bed NAD Neck: JVP to jaw no carotid bruit and supple, symmetrical, trachea  midline Lungs: diminished breath sounds bilaterally. Heart:IRR. Prominent s3 Abdomen: soft,NT ND Extremities: warm no 1+ edema   Lab Results: Results for orders placed during the hospital encounter of 04/03/14 (from the past 48 hour(s))  PROTIME-INR     Status: Abnormal   Collection Time    04/18/14  5:18 AM      Result Value Ref Range   Prothrombin Time 33.2 (*) 11.6 - 15.2 seconds   INR 3.26 (*) 0.00 - 6.57  BASIC METABOLIC PANEL     Status: Abnormal   Collection Time    04/18/14  5:18 AM      Result Value Ref Range   Sodium 144  137 - 147 mEq/L   Potassium 4.2  3.7 - 5.3 mEq/L   Chloride 106  96 - 112 mEq/L   CO2 25  19 - 32 mEq/L   Glucose, Bld 86  70 - 99 mg/dL   BUN 49 (*) 6 - 23 mg/dL   Creatinine, Ser 2.32 (*) 0.50 - 1.10 mg/dL   Calcium 9.2  8.4 - 10.5 mg/dL   GFR calc non Af Amer 19 (*) >90 mL/min   GFR calc Af Amer 22 (*) >90 mL/min   Comment: (NOTE)     The eGFR has been calculated using the CKD EPI equation.     This calculation has not been validated in all clinical situations.     eGFR's persistently <90 mL/min signify possible Chronic Kidney     Disease.   Anion gap 13  5 - 15  IRON AND TIBC     Status: Abnormal   Collection Time    04/18/14  5:18 AM      Result Value Ref Range   Iron 30 (*) 42 - 135 ug/dL   TIBC 280  250 - 470 ug/dL   Saturation Ratios 11 (*) 20 - 55 %   UIBC 250  125 - 400 ug/dL   Comment: Performed at [facility]     Status: None   Collection Time    04/18/14  5:18 AM      Result Value Ref Range   Ferritin 109  10 - 291 ng/mL   Comment: Performed at [place]     Status: Abnormal  Collection Time    04/19/14  4:11 AM      Result Value Ref Range   Prothrombin Time 27.5 (*) 11.6 - 15.2 seconds   INR 2.56 (*) 0.00 - 1.66  BASIC METABOLIC PANEL     Status: Abnormal   Collection Time    04/19/14  4:11 AM      Result Value Ref Range   Sodium 139  137 - 147 mEq/L   Potassium 3.1 (*)  3.7 - 5.3 mEq/L   Comment: DELTA CHECK NOTED   Chloride 98  96 - 112 mEq/L   CO2 25  19 - 32 mEq/L   Glucose, Bld 94  70 - 99 mg/dL   BUN 44 (*) 6 - 23 mg/dL   Creatinine, Ser 2.09 (*) 0.50 - 1.10 mg/dL   Calcium 9.2  8.4 - 10.5 mg/dL   GFR calc non Af Amer 22 (*) >90 mL/min   GFR calc Af Amer 25 (*) >90 mL/min   Comment: (NOTE)     The eGFR has been calculated using the CKD EPI equation.     This calculation has not been validated in all clinical situations.     eGFR's persistently <90 mL/min signify possible Chronic Kidney     Disease.   Anion gap 16 (*) 5 - 15    Imaging: No results found.  Assessment:  1. A/c systolic HF d/t NICM 2. Cardiogenic shock 3. Acute on CKD stage IV 4. Chronic AF - s/p AV node ablation 5. Frequent PVCs  Plan:   She has failed milrinone wean. She will need home milrinone. She is diuresing well on IV lasix and renal function better. Would continue IV lasix at least one more day. Supp K+. Hopefully home by end of week. PICC in place.   Will need HHRN with [facility] arranged for milrinone.  I think there is a small chance that restoration of sinus rhythm and abolition of PVCs may help her but it may not be enough. We are loading amio and tentatively planning DC-CV in 1 month. Continue coumadin  No carvedilol due to low output. Continue hydralazine/NTG. No ACE due to CKD.   Length of Stay:  LOS: 16 days   Glori Bickers MD 04/19/2014, 9:09 AM

## 2014-04-19 NOTE — Progress Notes (Signed)
[facility] PROGRESS NOTE  [name] IFO:[id number] DOB: 1933-06-25 DOA: 04/03/2014  PCP: Lanette Hampshire, MD  Interim Summary Patient is a pleasant 80 year old female with a past medical history of chronic systolic congestive heart failure having an ejection fraction of 20-25%, atrial fibrillation currently on anticoagulation who presented to the emergency room at [facility] on 04/03/2014 complaining of shortness of breath, nausea vomiting and right upper quadrant pain. Initial workup in the emergency room included a chest x-ray that showed cardiomegaly without overt failure, BNP of 19,030, Creatinine of 2.07. She was also found to have an elevated bilirubin of 3.0 and alk phos of 122. RUQ US showing thickened gallbladder. She was transferred from [facility] to [facility] for surgical evaluation. Liver enzymes trending down. HIDA scan did not show evidence of cholecystitis or biliary obstruction. Heart failure team consulted. She underwent right side heart cath which cardiac out put low, well compensated filling pressure. She also underwent AV node ablation. She was on Milrinone Gtt for 24 hours. She was transitioned to oral Lasix. On 8/14 her creatinine was noted to climb and she had low BP. Diuretics were held per cardiology. Renal function stabilized. Diuretics resumed 8/16 but renal function getting worse. Plus she is gaining weight again. Milrinone was reinitiated 8/17.  Assessment/Plan: 1. Acute on Chronic Combine Systolic and diastolic Congestive Heart Failure -Patient initially presented with signs and symptoms consistent with acute CHF.  -Transthoracic echocardiogram EF 15 to 20 %. Grade 3 diastolic dysfunction. -Patient S/P right side heart Cath, cardiac out put low, well compensated filling pressure.  -Patient S/P AV node ablation 8-7. -Started on Milrinone Gtt on 8-8.-- stopped 8-10.  -Due to Worsening dyspnea, restarted on IV lasix 8-11 and was  transitioned to oral lasix starting 8/13.  -But due to worsening renal function lasix was stopped on 8/14. Resumed 8/16 by cardiology. -Cardiology restarted Milrinone 8/17 as patient not doing well and gaining weight. She declines palliative care. Also changed to IV lasix on 8/17. She failed milrinone weaned and as per cardiology, she will need home milrinone. Continue IV Lasix and milrinone for cardiology. PICC placed. Hopefully home by end of week. - No carvedilol due to low output. No ACE/ARB due to CKD, Continue hydralazine/nitrates  2. Right Upper Quadrant Pain.  Improved.  -Initially there was concern for acute cholecystitis with her u/s showing thickened gallbladder wall -She was seen and evaluated by surgery & HIDA scan did not reveal evidence of cholecystitis.  -Liver enzymes trending down. -Suspect hepatic congestion from Heart failure.   3. Stage III chronic kidney disease with Acute worsening Creatinine continues to be higher than baseline. Patient's baseline creatinine near 1.7-1.9.   4. Hypokalemia -Replace and follow.   5. History of Chronic Atrial Fibrillation -Presently rate controlled with heart rates in the 80s to 90s -Continue Amiodarone and Coumadin -Digoxin discontinued. No Carvedilol due to low output. - Cardiology loading amiodarone and tentatively planning DCCV in one month.  6. Anticoagulation with Warfarin -Pharmacy managing warfarin  7. Anemia on CKD Hgb stable  DVT Prophylaxis: On warfarin Code Status: Full Code Family Communication:  Care discussed with patient. Disposition Plan: Disposition per cardiology. Home by end of the week.   Consultants:  Surgery  Heart Failure  Procedures:  HIDA scan Impression: Normal hepatobiliary nuclear medicine scan demonstrating no evidence of cholecystitis or biliary obstruction. Ultrasound findings may have related to relative contraction of the gallbladder at the time of imaging  PICC  line  HPI/Subjective: Overall states that she feels better but unhappy with hospital food. Dyspnea improved. Denies chest pain or palpitations.  Objective: Filed Vitals:   04/19/14 1430  BP: 125/46  Pulse: 72  Temp: 98.1 F (36.7 C)  Resp: 18    Intake/Output Summary (Last 24 hours) at 04/19/14 1622 Last data filed at 04/19/14 1300  Gross per 24 hour  Intake 1643.28 ml  Output   4700 ml  Net -3056.72 ml   Filed Weights   04/17/14 0610 04/18/14 0725 04/19/14 0433  Weight: 81.784 kg (180 lb 4.8 oz) 81.7 kg (180 lb 1.9 oz) 78.699 kg (173 lb 8 oz)    Exam:   General: Awake and alert, No distress  Cardiovascular: normal S1S2,  positive JVD. Systolic murmur over precordium  Respiratory:  Diminished breath sounds in the bases with occasional fine basal crackles. Rest of lung fields clear to auscultation.  Abdomen: Soft, nondistended, NT. BS present  Extremities: Trace bilateral leg edema.  CNS: Alert and oriented. No focal deficits.  Data Reviewed: Basic Metabolic Panel:  Recent Labs Lab 04/15/14 0404 04/16/14 0440 04/17/14 0425 04/18/14 0518 04/19/14 0411  NA 140 139 138 144 139  K 4.5 4.7 3.9 4.2 3.1*  CL 104 103 102 106 98  CO2 $Re'22 21 22 25 25  'zzP$ GLUCOSE 96 99 90 86 94  BUN 50* 50* 50* 49* 44*  CREATININE 2.26* 2.28* 2.40* 2.32* 2.09*  CALCIUM 9.2 9.4 8.9 9.2 9.2   CBC:  Recent Labs Lab 04/13/14 0530 04/17/14 0425  WBC 5.2 6.0  HGB 10.7* 10.5*  HCT 33.0* 31.6*  MCV 89.4 86.8  PLT 223 248   BNP (last 3 results)  Recent Labs  04/03/14 1056 04/05/14 1312 04/09/14 0424  PROBNP 19030.0* 5310.0* 3361.0*   Studies: Dg Chest Port 1 View  04/19/2014   CLINICAL DATA:  PICC line placement.  EXAM: PORTABLE CHEST - 1 VIEW  COMPARISON:  04/03/2014.  FINDINGS: Interim placement of PICC line. Its tip is at the cavoatrial junction. Severe cardiomegaly. Cardiac pacer in stable position. No pulmonary venous congestion. No focal pulmonary infiltrate. No  pneumothorax. No acute bony abnormality  IMPRESSION: 1. Interval placement of PICC line, its tip is at the caval atrial junction. 2. Severe cardiomegaly, stable. No pulmonary venous congestion. Cardiac pacer in stable position. 3. No acute pulmonary disease.   Electronically Signed   By: Marcello Moores  Register   On: 04/19/2014 11:55    Scheduled Meds: . amiodarone  200 mg Oral BID  . atorvastatin  20 mg Oral Daily  . colchicine  0.3 mg Oral Daily  . Febuxostat  1 tablet Oral Daily  . ferumoxytol  510 mg Intravenous Once  . furosemide  80 mg Intravenous BID  . hydrALAZINE  25 mg Oral 3 times per day  . isosorbide mononitrate  60 mg Oral Daily  . metolazone  2.5 mg Oral Daily  . sodium chloride  3 mL Intravenous Q12H  . sodium chloride  3 mL Intravenous Q12H  . warfarin  2.5 mg Oral ONCE-1800  . Warfarin - Pharmacist Dosing Inpatient   Does not apply q1800   Continuous Infusions: . milrinone 0.25 mcg/kg/min (04/18/14 2042)    Principal Problem:   Acute on chronic systolic HF (heart failure) Active Problems:   Atrial fibrillation, chronic   Nonischemic cardiomyopathy   Hypertension   Anemia, normocytic normochromic   Chronic anticoagulation   Cholecystitis, acute   Acute renal failure superimposed on stage 3 chronic kidney disease  Acute on chronic systolic heart failure   Dyspnea  Time spent: 25 minutes    LOS: 16 days   Raechel Marcos, MD, FACP, FHM. [facility] Pager [phone]  If 7PM-7AM, please contact night-coverage [email or web address] Password [email or web address] 04/19/2014, 4:30 PM

## 2014-04-20 LAB — BASIC METABOLIC PANEL
Anion gap: 14 (ref 5–15)
BUN: 45 mg/dL — AB (ref 6–23)
CALCIUM: 9.5 mg/dL (ref 8.4–10.5)
CO2: 28 meq/L (ref 19–32)
Chloride: 99 mEq/L (ref 96–112)
Creatinine, Ser: 2.11 mg/dL — ABNORMAL HIGH (ref 0.50–1.10)
GFR calc Af Amer: 25 mL/min — ABNORMAL LOW (ref >90)
GFR calc non Af Amer: 21 mL/min — ABNORMAL LOW (ref >90)
GLUCOSE: 93 mg/dL (ref 70–99)
Potassium: 3 mEq/L — ABNORMAL LOW (ref 3.7–5.3)
Sodium: 141 mEq/L (ref 137–147)

## 2014-04-20 LAB — CARBOXYHEMOGLOBIN
Carboxyhemoglobin: 1.7 % — ABNORMAL HIGH (ref 0.5–1.5)
Methemoglobin: 1.1 % (ref 0.0–1.5)
O2 Saturation: 64.8 %
Total hemoglobin: 11.6 g/dL — ABNORMAL LOW (ref 12.0–16.0)

## 2014-04-20 LAB — PROTIME-INR
INR: 2.52 — ABNORMAL HIGH (ref 0.00–1.49)
Prothrombin Time: 27.2 seconds — ABNORMAL HIGH (ref 11.6–15.2)

## 2014-04-20 MED ORDER — FUROSEMIDE 80 MG PO TABS
80.0000 mg | ORAL_TABLET | Freq: Every day | ORAL | Status: DC
  Administered 2014-04-20 – 2014-04-21 (×2): 80 mg via ORAL
  Filled 2014-04-20 (×2): qty 1

## 2014-04-20 MED ORDER — FERROUS SULFATE 325 (65 FE) MG PO TABS
325.0000 mg | ORAL_TABLET | Freq: Two times a day (BID) | ORAL | Status: DC
  Administered 2014-04-20 – 2014-04-21 (×3): 325 mg via ORAL
  Filled 2014-04-20 (×4): qty 1

## 2014-04-20 MED ORDER — WARFARIN SODIUM 2.5 MG PO TABS
2.5000 mg | ORAL_TABLET | Freq: Every day | ORAL | Status: DC
  Administered 2014-04-20: 2.5 mg via ORAL
  Filled 2014-04-20 (×2): qty 1

## 2014-04-20 MED ORDER — DOCUSATE SODIUM 100 MG PO CAPS
100.0000 mg | ORAL_CAPSULE | Freq: Every day | ORAL | Status: DC | PRN
  Filled 2014-04-20: qty 1

## 2014-04-20 MED ORDER — POTASSIUM CHLORIDE CRYS ER 20 MEQ PO TBCR
40.0000 meq | EXTENDED_RELEASE_TABLET | Freq: Two times a day (BID) | ORAL | Status: DC
  Administered 2014-04-20 – 2014-04-21 (×3): 40 meq via ORAL
  Filled 2014-04-20 (×4): qty 2

## 2014-04-20 NOTE — Progress Notes (Signed)
Patient alert and oriented, no c/o pain, V pace on the monitor, v/s this am BP 109/65, HR 76. Will continue to monitor.

## 2014-04-20 NOTE — Progress Notes (Addendum)
[facility] PROGRESS NOTE  [name] OIT:[id number] DOB: [birth year] DOA: 04/03/2014  PCP: Lanette Hampshire, MD  Interim Summary Patient is a pleasant 80 year old female with a past medical history of chronic systolic congestive heart failure having an ejection fraction of 20-25%, atrial fibrillation currently on anticoagulation who presented to the emergency room at [facility] on 04/03/2014 complaining of shortness of breath, nausea vomiting and right upper quadrant pain. Initial workup in the emergency room included a chest x-ray that showed cardiomegaly without overt failure, BNP of 19,030, Creatinine of 2.07. She was also found to have an elevated bilirubin of 3.0 and alk phos of 122. RUQ US showing thickened gallbladder. She was transferred from [facility] to [facility] for surgical evaluation. Liver enzymes trending down. HIDA scan did not show evidence of cholecystitis or biliary obstruction. Heart failure team consulted. She underwent right side heart cath which cardiac out put low, well compensated filling pressure. She also underwent AV node ablation. She was on Milrinone Gtt for 24 hours. She was transitioned to oral Lasix. On 8/14 her creatinine was noted to climb and she had low BP. Diuretics were held per cardiology. Renal function stabilized. Diuretics resumed 8/16 but renal function getting worse. Plus she is gaining weight again. Milrinone was reinitiated 8/17.  Assessment/Plan: 1. Acute on Chronic Systolic Congestive Heart Failure 2/2 NICM -Patient initially presented with signs and symptoms consistent with acute CHF.  -Transthoracic echocardiogram EF 15 to 20 %. Grade 3 diastolic dysfunction. -Patient S/P right side heart Cath, cardiac out put low, well compensated filling pressure.  -Patient S/P AV node ablation 8-7. -Started on Milrinone Gtt on 8-8.-- stopped 8-10.  -Due to Worsening dyspnea, restarted on IV lasix 8-11 and was transitioned to oral  lasix starting 8/13.  -But due to worsening renal function lasix was stopped on 8/14. Resumed 8/16 by cardiology. -Cardiology restarted Milrinone 8/17 as patient not doing well and gaining weight. She declines palliative care. Also changed to IV lasix on 8/17. She failed milrinone weaned and as per cardiology, she will need home milrinone. Continue IV Lasix and milrinone for cardiology. PICC placed. Hopefully home by end of week. - No carvedilol due to low output. No ACE/ARB due to CKD. Continue hydralazine/nitrates - Cards switching to PO Lasix today and possible DC home in am.  2. Right Upper Quadrant Pain.  Resolved -Initially there was concern for acute cholecystitis with her u/s showing thickened gallbladder wall -She was seen and evaluated by surgery & HIDA scan did not reveal evidence of cholecystitis.  -Liver enzymes trending down. -Suspect hepatic congestion from Heart failure.   3. Stage III chronic kidney disease with Acute worsening Creatinine continues to be higher than baseline. Patient's baseline creatinine near 1.7-1.9.   4. Hypokalemia -Replace and follow.   5. History of Chronic Atrial Fibrillation -Presently rate controlled with heart rates in the 80s to 90s -Continue Amiodarone and Coumadin -Digoxin discontinued. No Carvedilol due to low output. - Cardiology loading amiodarone and tentatively planning DCCV in one month.  6. Anticoagulation with Warfarin -Pharmacy managing warfarin. INR 2.52  7. Anemia on CKD Hgb stable  DVT Prophylaxis: On warfarin Code Status: Full Code Family Communication:  Care discussed with patient. Disposition Plan: Disposition per cardiology- possible DC home 8/21   Consultants:  Surgery  Heart Failure  Procedures:  HIDA scan Impression: Normal hepatobiliary nuclear medicine scan demonstrating no evidence of cholecystitis or biliary obstruction. Ultrasound findings may have related to relative contraction of the gallbladder  at  the time of imaging  PICC line 8/19   HPI/Subjective: Continues to feel better. SOB and swelling improved. No pain issues reported.  Objective: Filed Vitals:   04/20/14 0754  BP: 109/65  Pulse: 76  Temp:   Resp:    temperature 98.55F, respiratory rate 18 per minute and oxygen saturation 98%.  Intake/Output Summary (Last 24 hours) at 04/20/14 1137 Last data filed at 04/20/14 1113  Gross per 24 hour  Intake    750 ml  Output   3125 ml  Net  -2375 ml   Filed Weights   04/18/14 0725 04/19/14 0433 04/20/14 0500  Weight: 81.7 kg (180 lb 1.9 oz) 78.699 kg (173 lb 8 oz) 77.565 kg (171 lb)    Exam:   General: Awake and alert, No distress  Cardiovascular: normal S1S2,  negative JVD. Systolic murmur over precordium  Respiratory:  Diminished breath sounds in the bases with occasional fine basal crackles. Rest of lung fields clear to auscultation.  Abdomen: Soft, nondistended, NT. BS present  Extremities: No leg edema.  CNS: Alert and oriented. No focal deficits.  Data Reviewed: Basic Metabolic Panel:  Recent Labs Lab 04/16/14 0440 04/17/14 0425 04/18/14 0518 04/19/14 0411 04/20/14 0415  NA 139 138 144 139 141  K 4.7 3.9 4.2 3.1* 3.0*  CL 103 102 106 98 99  CO2 _0 GLUCOSE 99 90 86 94 93  BUN 50* 50* 49* 44* 45*  CREATININE 2.28* 2.40* 2.32* 2.09* 2.11*  CALCIUM 9.4 8.9 9.2 9.2 9.5   CBC:  Recent Labs Lab 04/17/14 0425  WBC 6.0  HGB 10.5*  HCT 31.6*  MCV 86.8  PLT 248   BNP (last 3 results)  Recent Labs  04/03/14 1056 04/05/14 1312 04/09/14 0424  PROBNP 19030.0* 5310.0* 3361.0*   Studies: Dg Chest Port 1 View  04/19/2014   CLINICAL DATA:  PICC line placement.  EXAM: PORTABLE CHEST - 1 VIEW  COMPARISON:  04/03/2014.  FINDINGS: Interim placement of PICC line. Its tip is at the cavoatrial junction. Severe cardiomegaly. Cardiac pacer in stable position. No pulmonary venous congestion. No focal pulmonary infiltrate. No pneumothorax. No  acute bony abnormality  IMPRESSION: 1. Interval placement of PICC line, its tip is at the caval atrial junction. 2. Severe cardiomegaly, stable. No pulmonary venous congestion. Cardiac pacer in stable position. 3. No acute pulmonary disease.   Electronically Signed   By: Marcello Moores  Register   On: 04/19/2014 11:55    Scheduled Meds: . amiodarone  200 mg Oral BID  . atorvastatin  20 mg Oral Daily  . colchicine  0.3 mg Oral Daily  . Febuxostat  1 tablet Oral Daily  . furosemide  80 mg Oral Daily  . hydrALAZINE  25 mg Oral 3 times per day  . isosorbide mononitrate  60 mg Oral Daily  . potassium chloride  40 mEq Oral BID  . sodium chloride  3 mL Intravenous Q12H  . sodium chloride  3 mL Intravenous Q12H  . warfarin  2.5 mg Oral q1800  . Warfarin - Pharmacist Dosing Inpatient   Does not apply q1800   Continuous Infusions: . milrinone 0.25 mcg/kg/min (04/20/14 7026)    Principal Problem:   Acute on chronic systolic HF (heart failure) Active Problems:   Atrial fibrillation, chronic   Nonischemic cardiomyopathy   Hypertension   Anemia, normocytic normochromic   Chronic anticoagulation   Cholecystitis, acute   Acute renal failure superimposed on stage 3 chronic kidney disease  Acute on chronic systolic heart failure   Dyspnea  Time spent: 25 minutes    LOS: 66 days   Sherrica Niehaus, MD, FACP, FHM. [facility] Pager [phone]  If 7PM-7AM, please contact night-coverage [email or web address] Password [facility] 04/20/2014, 11:37 AM

## 2014-04-20 NOTE — Progress Notes (Signed)
ANTICOAGULATION CONSULT NOTE - Follow-up Consult  Pharmacy Consult for Coumadin Indication: atrial fibrillation  Allergies  Allergen Reactions  . Tuberculin Tests Swelling    Patient Measurements: Height: 5\' 7"  (170.2 cm) Weight: 171 lb (77.565 kg) IBW/kg (Calculated) : 61.6   Vital Signs: Temp: 98.1 F (36.7 C) (08/20 0500) Temp src: Oral (08/20 0500) BP: 109/65 mmHg (08/20 0754) Pulse Rate: 76 (08/20 0754)  Labs:  Recent Labs  04/18/14 0518 04/19/14 0411 04/20/14 0415  LABPROT 33.2* 27.5* 27.2*  INR 3.26* 2.56* 2.52*  CREATININE 2.32* 2.09* 2.11*    Estimated Creatinine Clearance: 23.6 ml/min (by C-G formula based on Cr of 2.11).   Assessment: 80 yo female transferred from [facility] 8/3 for surgical evaluation for possible cholecystitis. Coumadin for afib was on hold in case surgery needed. HIDA scan negative on 8/5.  INR is now therapeutic 2.25.  Of note, patient was started on amiodarone on 8/10 which may potentiate the effects of Coumadin so may require lower dose. H/H low but stable, plt wnl with no reported s/s bleeding.  Home dose: 5mg  daily except for 7.5mg  on Thur. Last home dose 7/31.  Goal of Therapy:  INR: 2-3 Monitor platelets by anticoagulation protocol: Yes   Plan:  - Coumadin 2.5 mg PO daily - Continue daily INR - Monitor CBC, s/s bleeding  Leota Sauers Pharm.D. CPP, BCPS Clinical Pharmacist [phone] 04/20/2014 11:48 AM

## 2014-04-20 NOTE — Progress Notes (Signed)
Physical Therapy Treatment Patient Details Name: Brandi HackMargaret L [name] MRN: [id number] DOB: 09/04/1932 Today's Date: 04/20/2014    History of Present Illness 80 y.o. female [facility]14 with nausea and vomiting, abdominal pain, loss of appetitie and worsening SOB.  Initially there was concern for acute cholecystitis with her u/s showing thickened gallbladder wall, but she was seen and evaluated by surgery & HIDA scan did not reveal evidence of cholecystitis.  Symptoms then thought to be from acute CHF.  TEE showed EF of 15-20%.  She underwent a heart cath and an AV node ablation. Pt is back on p.o. lasix, but had PICC line placed for home milrinone (she failed wean from this medication).  Pt with continued CKD III, and hypokalemia.  Pt has significant PMhx of HTN, nonischemic cardiomyopathy, gout, CHF, CVA, DJD, chronic back pain, CAD, and defibrillator.      PT Comments    Pt is progressing well with her mobility.  She was able to demonstrate the ability to simulate stairs for home entry and preform seated leg exercises.  Pt would benefit from HHPT for strength and endurance training at discharge.  PT will continue to follow acutely.   Follow Up Recommendations  Home health PT;Supervision for mobility/OOB     Equipment Recommendations  None recommended by PT    Recommendations for Other Services  NA     Precautions / Restrictions Precautions Precautions: Fall    Mobility  Bed Mobility Overal bed mobility: Needs Assistance Bed Mobility: Supine to Sit     Supine to sit: Supervision     General bed mobility comments: Extra time needed to complete task, heavy reliance on her arms to support her to pull to sitting.   Transfers Overall transfer level: Needs assistance Equipment used: Rolling walker (2 wheeled) Transfers: Sit to/from Stand Sit to Stand: Min guard         General transfer comment: Min guard assist for safety during transitions.  Cues for safe hand  placement during transitions on RW.   Ambulation/Gait Ambulation/Gait assistance: Min guard Ambulation Distance (Feet): 120 Feet Assistive device: Rolling walker (2 wheeled) Gait Pattern/deviations: Step-through pattern;Shuffle;Trunk flexed Gait velocity: decreased Gait velocity interpretation: Below normal speed for age/gender General Gait Details: Pt with increased fatigue at the end of the walk.  DOE 2/4 and O2 sats stable and HR stable on RA.  Min guard assist for safety as pt visibly and physically fatigued.    Stairs Stairs: Yes Stairs assistance: Min guard Stair Management: Two rails;Step to pattern;Forwards;Backwards (forwards up backwards down) Number of Stairs: 4 General stair comments: Pt with visible fatige and effort to ascend the steps.  She reports she normally has one hand on railing (left) and one hand on cane (right).           Balance Overall balance assessment: Needs assistance Sitting-balance support: Feet supported;No upper extremity supported Sitting balance-Leahy Scale: Good     Standing balance support: Bilateral upper extremity supported Standing balance-Leahy Scale: Fair                      Cognition Arousal/Alertness: Awake/alert Behavior During Therapy: WFL for tasks assessed/performed Overall Cognitive Status: Within Functional Limits for tasks assessed                      Exercises General Exercises - Lower Extremity Long Arc Quad: AROM;Both;10 reps;Seated Hip ABduction/ADduction: AROM;Both;10 reps;Seated (adduction against pillow for resistance. ) Hip Flexion/Marching: AROM;Both;10  reps;Seated Toe Raises: AROM;Both;10 reps;Seated Heel Raises: AROM;Both;10 reps;Seated    General Comments General comments (skin integrity, edema, etc.): Pt reports her husband took her HEP home and she could not reliably report the HEP back to therapist, so we just did some seated leg strengthening exercises.        Pertinent  Vitals/Pain Pain Assessment: No/denies pain           PT Goals (current goals can now be found in the care plan section) Acute Rehab PT Goals Patient Stated Goal: To return to her home independently Progress towards PT goals: Progressing toward goals    Frequency  Min 3X/week    PT Plan Frequency needs to be updated;Discharge plan needs to be updated       End of Session Equipment Utilized During Treatment: Gait belt Activity Tolerance: Patient limited by fatigue Patient left: in chair;with call bell/phone within reach     Time: 1112-1133 PT Time Calculation (min): 21 min  Charges:  $Gait Training: 8-22 mins                      [name], PT, DPT [phone]   04/20/2014, 1:38 PM

## 2014-04-20 NOTE — Progress Notes (Signed)
DAILY PROGRESS NOTE  Subjective:   80 y/o with severe NICM and chronic AF s/p AV node ablation. Admitted with worsening HF. ICD interrogation showed 1/3 of ventricular beats actually conducted through AV node limiting BivPacing to 2/3 of time. Underwent repeat AV node ablation this admit. Also was on milrinone for brief time but stopped as she felt it didn't help.  RHC on 8/7 as below. Amio started with eye toward DC-CV in 4 weeks.   RA = 9  RV = 34/2/6  PA = 34/16 (24)  PCW = 12  Fick cardiac output/index = 3.6/1.8  PVR = 3.3 WU  FA sat = 98%  PA sat = 50%, 52%   Milrinone restarted 8/17 due to worsening renal and volume status. Feels better. Renal function improved. Down 9 pounds in 2 days. No dyspnea.   Objective:  Temp:  [98 F (36.7 C)-98.1 F (36.7 C)] 98.1 F (36.7 C) (08/20 0500) Pulse Rate:  [69-84] 76 (08/20 0754) Resp:  [18] 18 (08/20 0500) BP: (103-135)/(46-79) 109/65 mmHg (08/20 0754) SpO2:  [98 %-99 %] 98 % (08/20 0500) Weight:  [77.565 kg (171 lb)] 77.565 kg (171 lb) (08/20 0500) Weight change: -4.135 kg (-9 lb 1.9 oz)  Intake/Output from previous day: 08/19 0701 - 08/20 0700 In: 25 [P.O.:490] Out: 2800 [Urine:2800]  Intake/Output from this shift: Total I/O In: 360 [P.O.:360] Out: 325 [Urine:325]  Medications: Current Facility-Administered Medications  Medication Dose Route Frequency Provider Last Rate Last Dose  . 0.9 %  sodium chloride infusion  250 mL Intravenous PRN Jolaine Artist, MD      . 0.9 %  sodium chloride infusion  250 mL Intravenous PRN Evans Lance, MD      . acetaminophen (TYLENOL) tablet 650 mg  650 mg Oral Q4H PRN Evans Lance, MD      . amiodarone (PACERONE) tablet 200 mg  200 mg Oral BID Jolaine Artist, MD   200 mg at 04/19/14 2157  . atorvastatin (LIPITOR) tablet 20 mg  20 mg Oral Daily Lezlie Octave Black, NP   20 mg at 04/19/14 1155  . bisacodyl (DULCOLAX) suppository 10 mg  10 mg Rectal Daily PRN Lezlie Octave Black, NP       . colchicine tablet 0.3 mg  0.3 mg Oral Daily Kelvin Cellar, MD   0.3 mg at 04/19/14 1157  . Febuxostat TABS 80 mg  1 tablet Oral Daily Lezlie Octave Black, NP   80 mg at 04/19/14 1157  . furosemide (LASIX) injection 80 mg  80 mg Intravenous BID Jolaine Artist, MD   80 mg at 04/20/14 0755  . hydrALAZINE (APRESOLINE) tablet 25 mg  25 mg Oral 3 times per day Jolaine Artist, MD   25 mg at 04/20/14 0607  . HYDROcodone-acetaminophen (NORCO) 7.5-325 MG per tablet 1 tablet  1 tablet Oral Q4H PRN Radene Gunning, NP   1 tablet at 04/07/14 1054  . isosorbide mononitrate (IMDUR) 24 hr tablet 60 mg  60 mg Oral Daily Radene Gunning, NP   60 mg at 04/19/14 1155  . metolazone (ZAROXOLYN) tablet 2.5 mg  2.5 mg Oral Daily Jolaine Artist, MD   2.5 mg at 04/19/14 1156  . milrinone (PRIMACOR) 20 MG/100ML (0.2 mg/mL) infusion  0.25 mcg/kg/min Intravenous Continuous Jolaine Artist, MD 6.1 mL/hr at 04/20/14 0607 0.25 mcg/kg/min at 04/20/14 0607  . nitroGLYCERIN (NITROSTAT) SL tablet 0.4 mg  0.4 mg Sublingual Q5 min PRN Lezlie Octave  Black, NP      . ondansetron (ZOFRAN) injection 4 mg  4 mg Intravenous Q6H PRN Marinus Maw, MD   4 mg at 04/13/14 1820  . potassium chloride SA (K-DUR,KLOR-CON) CR tablet 40 mEq  40 mEq Oral BID Elease Etienne, MD      . sodium chloride 0.9 % injection 10-40 mL  10-40 mL Intracatheter PRN Dolores Patty, MD   10 mL at 04/20/14 0419  . sodium chloride 0.9 % injection 3 mL  3 mL Intravenous Q12H Dolores Patty, MD   3 mL at 04/19/14 1159  . sodium chloride 0.9 % injection 3 mL  3 mL Intravenous PRN Dolores Patty, MD      . sodium chloride 0.9 % injection 3 mL  3 mL Intravenous Q12H Marinus Maw, MD   3 mL at 04/19/14 1158  . sodium chloride 0.9 % injection 3 mL  3 mL Intravenous PRN Marinus Maw, MD      . Warfarin - Pharmacist Dosing Inpatient   Does not apply q1800 Lavonia Dana, [facility]        Physical Exam: General appearance: elderly female lying in bed  NAD Neck: JVP 7 no carotid bruit and supple, symmetrical, trachea midline Lungs: diminished breath sounds bilaterally. Heart:IRR. Prominent s3 Abdomen: soft,NT ND Extremities: warm no 1+ edema   Lab Results: Results for orders placed during the hospital encounter of 04/03/14 (from the past 48 hour(s))  PROTIME-INR     Status: Abnormal   Collection Time    04/19/14  4:11 AM      Result Value Ref Range   Prothrombin Time 27.5 (*) 11.6 - 15.2 seconds   INR 2.56 (*) 0.00 - 1.49  BASIC METABOLIC PANEL     Status: Abnormal   Collection Time    04/19/14  4:11 AM      Result Value Ref Range   Sodium 139  137 - 147 mEq/L   Potassium 3.1 (*) 3.7 - 5.3 mEq/L   Comment: DELTA CHECK NOTED   Chloride 98  96 - 112 mEq/L   CO2 25  19 - 32 mEq/L   Glucose, Bld 94  70 - 99 mg/dL   BUN 44 (*) 6 - 23 mg/dL   Creatinine, Ser 2.59 (*) 0.50 - 1.10 mg/dL   Calcium 9.2  8.4 - 27.0 mg/dL   GFR calc non Af Amer 22 (*) >90 mL/min   GFR calc Af Amer 25 (*) >90 mL/min   Comment: (NOTE)     The eGFR has been calculated using the CKD EPI equation.     This calculation has not been validated in all clinical situations.     eGFR's persistently <90 mL/min signify possible Chronic Kidney     Disease.   Anion gap 16 (*) 5 - 15  PROTIME-INR     Status: Abnormal   Collection Time    04/20/14  4:15 AM      Result Value Ref Range   Prothrombin Time 27.2 (*) 11.6 - 15.2 seconds   INR 2.52 (*) 0.00 - 1.49  BASIC METABOLIC PANEL     Status: Abnormal   Collection Time    04/20/14  4:15 AM      Result Value Ref Range   Sodium 141  137 - 147 mEq/L   Potassium 3.0 (*) 3.7 - 5.3 mEq/L   Chloride 99  96 - 112 mEq/L   CO2 28  19 - 32 mEq/L  Glucose, Bld 93  70 - 99 mg/dL   BUN 45 (*) 6 - 23 mg/dL   Creatinine, Ser 2.11 (*) 0.50 - 1.10 mg/dL   Calcium 9.5  8.4 - 10.5 mg/dL   GFR calc non Af Amer 21 (*) >90 mL/min   GFR calc Af Amer 25 (*) >90 mL/min   Comment: (NOTE)     The eGFR has been calculated using the  CKD EPI equation.     This calculation has not been validated in all clinical situations.     eGFR's persistently <90 mL/min signify possible Chronic Kidney     Disease.   Anion gap 14  5 - 15    Imaging: Dg Chest Port 1 View  04/19/2014   CLINICAL DATA:  PICC line placement.  EXAM: PORTABLE CHEST - 1 VIEW  COMPARISON:  04/03/2014.  FINDINGS: Interim placement of PICC line. Its tip is at the cavoatrial junction. Severe cardiomegaly. Cardiac pacer in stable position. No pulmonary venous congestion. No focal pulmonary infiltrate. No pneumothorax. No acute bony abnormality  IMPRESSION: 1. Interval placement of PICC line, its tip is at the caval atrial junction. 2. Severe cardiomegaly, stable. No pulmonary venous congestion. Cardiac pacer in stable position. 3. No acute pulmonary disease.   Electronically Signed   By: Marcello Moores  Register   On: 04/19/2014 11:55    Assessment:  1. A/c systolic HF d/t NICM 2. Cardiogenic shock 3. Acute on CKD stage IV 4. Chronic AF - s/p AV node ablation 5. Frequent PVCs  Plan:   She has failed milrinone wean. She will need home milrinone. She has diuresed well on IV lasix and renal function stable. Now close to euvolemia. Will switch to po lasix 80 mg daily. Hopefully home tomorrow. Will need co-ox to objectively evaluate response to milrinone. Supp K+.  Will need HHRN with [facility] arranged for milrinone.  I think there is a small chance that restoration of sinus rhythm and abolition of PVCs may help her but it may not be enough. We are loading amio and tentatively planning DC-CV in 1 month. Continue coumadin  No carvedilol due to low output. Continue hydralazine/NTG. No ACE due to CKD.   Length of Stay:  LOS: 17 days   Glori Bickers MD 04/20/2014, 10:28 AM

## 2014-04-21 ENCOUNTER — Inpatient Hospital Stay (HOSPITAL_COMMUNITY): Admit: 2014-04-21 | Payer: Medicare HMO

## 2014-04-21 LAB — BASIC METABOLIC PANEL
Anion gap: 15 (ref 5–15)
BUN: 43 mg/dL — ABNORMAL HIGH (ref 6–23)
CO2: 28 mEq/L (ref 19–32)
Calcium: 9.6 mg/dL (ref 8.4–10.5)
Chloride: 99 mEq/L (ref 96–112)
Creatinine, Ser: 2.21 mg/dL — ABNORMAL HIGH (ref 0.50–1.10)
GFR calc non Af Amer: 20 mL/min — ABNORMAL LOW (ref >90)
GFR, EST AFRICAN AMERICAN: 23 mL/min — AB (ref >90)
Glucose, Bld: 99 mg/dL (ref 70–99)
POTASSIUM: 3.6 meq/L — AB (ref 3.7–5.3)
SODIUM: 142 meq/L (ref 137–147)

## 2014-04-21 LAB — PROTIME-INR
INR: 2.22 — AB (ref 0.00–1.49)
PROTHROMBIN TIME: 24.6 s — AB (ref 11.6–15.2)

## 2014-04-21 MED ORDER — POTASSIUM CHLORIDE CRYS ER 20 MEQ PO TBCR: 40.0000 meq | EXTENDED_RELEASE_TABLET | Freq: Every day | ORAL | Status: DC

## 2014-04-21 MED ORDER — FUROSEMIDE 40 MG PO TABS: 60.0000 mg | ORAL_TABLET | Freq: Every day | ORAL | Status: DC

## 2014-04-21 MED ORDER — ISOSORBIDE MONONITRATE ER 60 MG PO TB24
60.0000 mg | ORAL_TABLET | Freq: Every day | ORAL | Status: DC
Start: 2014-04-21 — End: 2014-04-21

## 2014-04-21 MED ORDER — WARFARIN SODIUM 2.5 MG PO TABS: 2.5000 mg | ORAL_TABLET | ORAL | Status: DC

## 2014-04-21 MED ORDER — WARFARIN SODIUM 5 MG PO TABS
5.0000 mg | ORAL_TABLET | ORAL | Status: DC
  Administered 2014-04-21: 5 mg via ORAL
  Filled 2014-04-21: qty 1

## 2014-04-21 MED ORDER — DSS 100 MG PO CAPS: 100.0000 mg | ORAL_CAPSULE | Freq: Every day | ORAL | Status: DC | PRN

## 2014-04-21 MED ORDER — AMIODARONE HCL 200 MG PO TABS: 200.0000 mg | ORAL_TABLET | Freq: Two times a day (BID) | ORAL | Status: DC

## 2014-04-21 MED ORDER — FERROUS SULFATE 325 (65 FE) MG PO TABS: 325.0000 mg | ORAL_TABLET | Freq: Two times a day (BID) | ORAL | Status: AC

## 2014-04-21 MED ORDER — WARFARIN SODIUM 2.5 MG PO TABS: ORAL_TABLET | ORAL | Status: AC

## 2014-04-21 MED ORDER — HYDRALAZINE HCL 25 MG PO TABS: 25.0000 mg | ORAL_TABLET | Freq: Three times a day (TID) | ORAL | Status: DC

## 2014-04-21 MED ORDER — MILRINONE IN DEXTROSE 20 MG/100ML IV SOLN: 0.2500 ug/kg/min | INTRAVENOUS | Status: DC

## 2014-04-21 MED ORDER — FERROUS SULFATE 325 (65 FE) MG PO TABS: 325.0000 mg | ORAL_TABLET | Freq: Two times a day (BID) | ORAL | Status: DC

## 2014-04-21 MED ORDER — WARFARIN SODIUM 2.5 MG PO TABS: ORAL_TABLET | ORAL | Status: DC

## 2014-04-21 MED ORDER — ISOSORBIDE MONONITRATE ER 60 MG PO TB24: 60.0000 mg | ORAL_TABLET | Freq: Every day | ORAL | Status: DC

## 2014-04-21 MED ORDER — ACETAMINOPHEN 325 MG PO TABS: 650.0000 mg | ORAL_TABLET | ORAL | Status: DC | PRN

## 2014-04-21 NOTE — Progress Notes (Signed)
1100-1500 shift. Pt.is A/Ox4 and is ambulatory with 1 person assist. She had no c/o pain and no signs of distress. She is on a continuous milrinone drip that she will be discharged home with. Received paperwork from cardiologist to give to case manager. Called case manager to notify that paperwork would be left in chart. Called again for follow-up and was told paperwork was received and that the IV team RN also received a copy.

## 2014-04-21 NOTE — Progress Notes (Signed)
DAILY PROGRESS NOTE  Subjective:   80 y/o with severe NICM and chronic AF s/p AV node ablation. Admitted with worsening HF. ICD interrogation showed 1/3 of ventricular beats actually conducted through AV node limiting BivPacing to 2/3 of time. Underwent repeat AV node ablation this admit. Also was on milrinone for brief time but stopped as she felt it didn't help.  RHC on 8/7 as below. Amio started with eye toward DC-CV in 4 weeks.   RA = 9  RV = 34/2/6  PA = 34/16 (24)  PCW = 12  Fick cardiac output/index = 3.6/1.8  PVR = 3.3 WU  FA sat = 98%  PA sat = 50%, 52%   Milrinone restarted 8/17 due to worsening renal and volume status. Feels better. Renal function slightly worse. Down 9 12 pounds in 3 days. No dyspnea.   Objective:  Temp:  [97.6 F (36.4 C)-97.8 F (36.6 C)] 97.8 F (36.6 C) (08/21 0500) Pulse Rate:  [77-88] 77 (08/21 1035) Resp:  [18] 18 (08/21 0500) BP: (104-112)/(55-66) 104/58 mmHg (08/21 1035) SpO2:  [98 %-100 %] 100 % (08/21 1035) Weight:  [76.6 kg (168 lb 14 oz)] 76.6 kg (168 lb 14 oz) (08/21 0500) Weight change: -0.965 kg (-2 lb 2 oz)  Intake/Output from previous day: 08/20 0701 - 08/21 0700 In: 1052 [P.O.:850; I.V.:202] Out: 2825 [Urine:2825]  Intake/Output from this shift: Total I/O In: 360 [P.O.:360] Out: 150 [Urine:150]  Medications: Current Facility-Administered Medications  Medication Dose Route Frequency Provider Last Rate Last Dose  . 0.9 %  sodium chloride infusion  250 mL Intravenous PRN Jolaine Artist, MD      . 0.9 %  sodium chloride infusion  250 mL Intravenous PRN Evans Lance, MD 10 mL/hr at 04/20/14 1714 250 mL at 04/20/14 1714  . acetaminophen (TYLENOL) tablet 650 mg  650 mg Oral Q4H PRN Evans Lance, MD      . amiodarone (PACERONE) tablet 200 mg  200 mg Oral BID Jolaine Artist, MD   200 mg at 04/21/14 1036  . atorvastatin (LIPITOR) tablet 20 mg  20 mg Oral Daily Radene Gunning, NP   20 mg at 04/21/14 1036  .  bisacodyl (DULCOLAX) suppository 10 mg  10 mg Rectal Daily PRN Lezlie Octave Black, NP      . colchicine tablet 0.3 mg  0.3 mg Oral Daily Kelvin Cellar, MD   0.3 mg at 04/21/14 1037  . docusate sodium (COLACE) capsule 100 mg  100 mg Oral Daily PRN Modena Jansky, MD      . Febuxostat TABS 80 mg  1 tablet Oral Daily Lezlie Octave Black, NP   80 mg at 04/21/14 1036  . ferrous sulfate tablet 325 mg  325 mg Oral BID WC Modena Jansky, MD   325 mg at 04/21/14 0350  . furosemide (LASIX) tablet 80 mg  80 mg Oral Daily Jolaine Artist, MD   80 mg at 04/21/14 1036  . hydrALAZINE (APRESOLINE) tablet 25 mg  25 mg Oral 3 times per day Jolaine Artist, MD   25 mg at 04/21/14 0938  . HYDROcodone-acetaminophen (NORCO) 7.5-325 MG per tablet 1 tablet  1 tablet Oral Q4H PRN Radene Gunning, NP   1 tablet at 04/07/14 1054  . isosorbide mononitrate (IMDUR) 24 hr tablet 60 mg  60 mg Oral Daily Radene Gunning, NP   60 mg at 04/21/14 1036  . milrinone (PRIMACOR) 20 MG/100ML (0.2 mg/mL) infusion  0.25 mcg/kg/min Intravenous Continuous Shaune Pascal Massie Mees, MD 6.1 mL/hr at 04/21/14 1214 0.25 mcg/kg/min at 04/21/14 1214  . nitroGLYCERIN (NITROSTAT) SL tablet 0.4 mg  0.4 mg Sublingual Q5 min PRN Radene Gunning, NP      . ondansetron [facility]) injection 4 mg  4 mg Intravenous Q6H PRN Evans Lance, MD   4 mg at 04/13/14 1820  . [START ON 04/22/2014] potassium chloride SA (K-DUR,KLOR-CON) CR tablet 40 mEq  40 mEq Oral Daily Modena Jansky, MD      . sodium chloride 0.9 % injection 10-40 mL  10-40 mL Intracatheter PRN Jolaine Artist, MD   10 mL at 04/21/14 0509  . sodium chloride 0.9 % injection 3 mL  3 mL Intravenous Q12H Jolaine Artist, MD   3 mL at 04/19/14 1159  . sodium chloride 0.9 % injection 3 mL  3 mL Intravenous PRN Jolaine Artist, MD      . sodium chloride 0.9 % injection 3 mL  3 mL Intravenous Q12H Evans Lance, MD   3 mL at 04/19/14 1158  . sodium chloride 0.9 % injection 3 mL  3 mL Intravenous PRN Evans Lance, MD      . Derrill Memo ON 04/22/2014] warfarin (COUMADIN) tablet 2.5 mg  2.5 mg Oral Once per day on Sun Tue Wed Thu Sat Modena Jansky, MD      . warfarin (COUMADIN) tablet 5 mg  5 mg Oral Once per day on Mon Fri Modena Jansky, MD      . Warfarin - Pharmacist Dosing Inpatient   Does not apply [place], [facility]        Physical Exam: General appearance: elderly female lying in bed NAD Neck: JVP 5 no carotid bruit and supple, symmetrical, trachea midline Lungs: diminished breath sounds bilaterally. Heart:IRR. Prominent s3 Abdomen: soft,NT ND Extremities: warm no tr edema   Lab Results: Results for orders placed during the hospital encounter of 04/03/14 (from the past 48 hour(s))  PROTIME-INR     Status: Abnormal   Collection Time    04/20/14  4:15 AM      Result Value Ref Range   Prothrombin Time 27.2 (*) 11.6 - 15.2 seconds   INR 2.52 (*) 0.00 - 2.94  BASIC METABOLIC PANEL     Status: Abnormal   Collection Time    04/20/14  4:15 AM      Result Value Ref Range   Sodium 141  137 - 147 mEq/L   Potassium 3.0 (*) 3.7 - 5.3 mEq/L   Chloride 99  96 - 112 mEq/L   CO2 28  19 - 32 mEq/L   Glucose, Bld 93  70 - 99 mg/dL   BUN 45 (*) 6 - 23 mg/dL   Creatinine, Ser 2.11 (*) 0.50 - 1.10 mg/dL   Calcium 9.5  8.4 - 10.5 mg/dL   GFR calc non Af Amer 21 (*) >90 mL/min   GFR calc Af Amer 25 (*) >90 mL/min   Comment: (NOTE)     The eGFR has been calculated using the CKD EPI equation.     This calculation has not been validated in all clinical situations.     eGFR's persistently <90 mL/min signify possible Chronic Kidney     Disease.   Anion gap 14  5 - 15  CARBOXYHEMOGLOBIN     Status: Abnormal   Collection Time    04/20/14  1:07 PM  Result Value Ref Range   Total hemoglobin 11.6 (*) 12.0 - 16.0 g/dL   O2 Saturation 64.8     Carboxyhemoglobin 1.7 (*) 0.5 - 1.5 %   Methemoglobin 1.1  0.0 - 1.5 %  PROTIME-INR     Status: Abnormal   Collection Time    04/21/14  5:10  AM      Result Value Ref Range   Prothrombin Time 24.6 (*) 11.6 - 15.2 seconds   INR 2.22 (*) 0.00 - 5.46  BASIC METABOLIC PANEL     Status: Abnormal   Collection Time    04/21/14  5:10 AM      Result Value Ref Range   Sodium 142  137 - 147 mEq/L   Potassium 3.6 (*) 3.7 - 5.3 mEq/L   Chloride 99  96 - 112 mEq/L   CO2 28  19 - 32 mEq/L   Glucose, Bld 99  70 - 99 mg/dL   BUN 43 (*) 6 - 23 mg/dL   Creatinine, Ser 2.21 (*) 0.50 - 1.10 mg/dL   Calcium 9.6  8.4 - 10.5 mg/dL   GFR calc non Af Amer 20 (*) >90 mL/min   GFR calc Af Amer 23 (*) >90 mL/min   Comment: (NOTE)     The eGFR has been calculated using the CKD EPI equation.     This calculation has not been validated in all clinical situations.     eGFR's persistently <90 mL/min signify possible Chronic Kidney     Disease.   Anion gap 15  5 - 15    Imaging: No results found.  Assessment:  1. A/c systolic HF d/t NICM 2. Cardiogenic shock 3. Acute on CKD stage IV 4. Chronic AF - s/p AV node ablation 5. Frequent PVCs  Plan:   She has failed milrinone wean. She will need home milrinone. He weight is now below baseline and probably a little dry.  I think she can go home today with milrinone (paperwork completed) and HHRN. Would cut lasix to 60 daily.   I think there is a small chance that restoration of sinus rhythm and abolition of PVCs may help her but it may not be enough. We are loading amio and tentatively planning DC-CV in 1 month. Continue coumadin  No carvedilol due to low output. Continue hydralazine/NTG. No ACE due to CKD.  Will arrange HF f/u next week.  HF meds: Lasix 60 mg daily Milrinone 0.25 mcg/kg/min Amio 200 bid Hydralazine 25 tid Imdur 60 daily Warfarin   Length of Stay:  LOS: 18 days   Glori Bickers MD 04/21/2014, 1:31 PM

## 2014-04-21 NOTE — Progress Notes (Signed)
Pt is being discharged home. Pt is being transported home by her husband. Pt has been provided with discharge instructions. RN went over instructions with the patient and answered all questions that the patient had.

## 2014-04-21 NOTE — Discharge Summary (Signed)
Physician Discharge Summary  [name] ZOX:[id number] DOB: 1933/03/19 DOA: 04/03/2014  PCP: Lanette Hampshire, MD  Admit date: 04/03/2014 Discharge date: 04/21/2014  Time spent: Greater than 30 minutes  Recommendations for Outpatient Follow-up:  1. Home health PT and RN. Home health RN to draw labs (CBC, BMP, PT & INR) on 04/25/14. Results to be forwarded to patient's PCP for Coumadin dose adjustment. 2. Advanced heart failure clinic on 04/26/14 at 8:45 AM. 3. Dr. Marjean Donna, PCP in 1 week.  Discharge Diagnoses:  Principal Problem:   Acute on chronic systolic HF (heart failure) Active Problems:   Atrial fibrillation, chronic   Nonischemic cardiomyopathy   Hypertension   Anemia, normocytic normochromic   Chronic anticoagulation   Cholecystitis, acute   Acute renal failure superimposed on stage 3 chronic kidney disease   Acute on chronic systolic heart failure   Dyspnea   Discharge Condition: Improved & Stable  Diet recommendation: Heart healthy diet.  Filed Weights   04/19/14 0433 04/20/14 0500 04/21/14 0500  Weight: 78.699 kg (173 lb 8 oz) 77.565 kg (171 lb) 76.6 kg (168 lb 14 oz)    History of present illness:  Patient is a pleasant 80 year old female with a past medical history of chronic systolic congestive heart failure having an ejection fraction of 20-25%, chronic atrial fibrillation- s/p AVN ablation & currently on anticoagulation,  who presented to the emergency room at [facility] on 04/03/2014 complaining of shortness of breath, nausea vomiting and right upper quadrant pain. Initial workup in the emergency room included a chest x-ray that showed cardiomegaly without overt failure, BNP of 19,030, Creatinine of 2.07. She was also found to have an elevated bilirubin of 3.0 and alk phos of 122. RUQ US showing thickened gallbladder. She was transferred from [facility] to [facility] for surgical evaluation. Cardiology consulted for heart failure  management.  Hospital Course:    Acute on Chronic Systolic Congestive Heart Failure secondary to NICM/cardiogenic shock  - Patient was admitted with worsening heart failure. Cardiology was consulted. ICD interrogation showed 1/3 of ventricular beats actually conducted through the AV node limiting BiV pacing to 2/3 of time. Patient underwent repeat AV node ablation this admit. Patient was started on milrinone for brief time but stopped as she felt it didn't help. Right heart cath on 8/7 showed low cardiac output and well compensated filling pressures. She failed milrinone wean and as per cardiology, she will need home milrinone for which PICC line has been placed. Lasix transitioned to by mouth. Her weight is now below baseline and probably a little dry. Cardiology has cleared her for discharge home today. She is being loaded with amiodarone and tentatively plan DCCV in one month. Continue Coumadin. No carvedilol due to low output. Continue hydralazine and NTG. No ACE inhibitor secondary to chronic kidney disease. Cardiology has arranged followup next week.    Right Upper Quadrant Pain.  -Initially there was concern for acute cholecystitis with her u/s showing thickened gallbladder wall. She was seen and evaluated by surgery & HIDA scan did not reveal evidence of cholecystitis. Liver enzymes trending down. Suspect hepatic congestion from Heart failure as etiology .  abdominal pain resolved.    Stage III chronic kidney disease with Acute worsening  - Creatinine continues to be higher than baseline. Patient's baseline creatinine near 1.7-1.9.  Monitor BMP closely as outpatient.    Hypokalemia  - Replaced and follow closely.    Chronic Atrial Fibrillation  - Digoxin discontinued. No Carvedilol due  to low output. Cardiology loading amiodarone and tentatively planning DCCV in one month.  Anticoagulated on Coumadin. As per patient, PCP monitors Coumadin-home health RN will draw labs.   Anemia on  CKD  - Hgb stable   Consultations:   Advanced heart failure team    General surgery  Procedures:  PICC line 8/19     Discharge Exam:  Complaints:   Denies dyspnea. Anxious to go home. No other complaints.   Filed Vitals:   04/20/14 2030 04/21/14 0500 04/21/14 1035 04/21/14 1404  BP:  112/66 104/58 113/49  Pulse:  88 77 76  Temp: 97.6 F (36.4 C) 97.8 F (36.6 C)  98.3 F (36.8 C)  TempSrc: Oral Oral  Oral  Resp: $Remo'18 18  18  'AKBuF$ Height:      Weight:  76.6 kg (168 lb 14 oz)    SpO2:  100% 100% 98%    General: Awake and alert, No distress  Cardiovascular: normal S1S2, negative JVD. Systolic murmur over precordium  Respiratory: Diminished breath sounds in the bases with occasional fine basal crackles. Rest of lung fields clear to auscultation. Abdomen: Soft, nondistended, NT. BS present  Extremities: No leg edema. CNS: Alert and oriented. No focal deficits.   Discharge Instructions      Discharge Instructions   (HEART FAILURE PATIENTS) Call MD:  Anytime you have any of the following symptoms: 1) 3 pound weight gain in 24 hours or 5 pounds in 1 week 2) shortness of breath, with or without a dry hacking cough 3) swelling in the hands, feet or stomach 4) if you have to sleep on extra pillows at night in order to breathe.    Complete by:  As directed      Call MD for:  difficulty breathing, headache or visual disturbances    Complete by:  As directed      Diet - low sodium heart healthy    Complete by:  As directed      Increase activity slowly    Complete by:  As directed             Medication List    STOP taking these medications       carvedilol 25 MG tablet  Commonly known as:  COREG     HYDROcodone-acetaminophen 7.5-325 MG per tablet  Commonly known as:  NORCO     LANOXIN 0.125 MG tablet  Generic drug:  digoxin     metFORMIN 500 MG tablet  Commonly known as:  GLUCOPHAGE     metoprolol tartrate 25 MG tablet  Commonly known as:  LOPRESSOR      TAKE  these medications       acetaminophen 325 MG tablet  Commonly known as:  TYLENOL  Take 2 tablets (650 mg total) by mouth every 4 (four) hours as needed for mild pain, moderate pain, fever or headache.     amiodarone 200 MG tablet  Commonly known as:  PACERONE  Take 1 tablet (200 mg total) by mouth 2 (two) times daily.     atorvastatin 20 MG tablet  Commonly known as:  LIPITOR  Take 20 mg by mouth daily.     bisacodyl 10 MG suppository  Commonly known as:  DULCOLAX  Place 1 suppository (10 mg total) rectally daily as needed for moderate constipation.     colchicine 0.6 MG tablet  Take 0.5 tablets (0.3 mg total) by mouth daily.     DSS 100 MG Caps  Take 100 mg by  mouth daily as needed (mild constipation).     ferrous sulfate 325 (65 FE) MG tablet  Take 1 tablet (325 mg total) by mouth 2 (two) times daily with a meal.     Fish Oil 1000 MG Caps  Take 1 capsule by mouth daily.     furosemide 40 MG tablet  Commonly known as:  LASIX  Take 1.5 tablets (60 mg total) by mouth daily.     hydrALAZINE 25 MG tablet  Commonly known as:  APRESOLINE  Take 1 tablet (25 mg total) by mouth 3 (three) times daily.     isosorbide mononitrate 60 MG 24 hr tablet  Commonly known as:  IMDUR  Take 1 tablet (60 mg total) by mouth daily.     milrinone 20 MG/100ML Soln infusion  Commonly known as:  PRIMACOR  Inject 20.45 mcg/min into the vein continuous. 0.25 mcg/kg/min     multivitamin tablet  Take 0.5 tablets by mouth daily.     nitroGLYCERIN 0.4 MG SL tablet  Commonly known as:  NITROSTAT  Place 0.4 mg under the tongue every 5 (five) minutes as needed for chest pain.     potassium chloride SA 20 MEQ tablet  Commonly known as:  K-DUR,KLOR-CON  Take 2 tablets (40 mEq total) by mouth daily.     ULORIC 80 MG Tabs  Generic drug:  Febuxostat  Take 1 tablet by mouth daily.     warfarin 2.5 MG tablet  Commonly known as:  COUMADIN  Take 1 tablet per day on Sun Tue Wed Thu Sat and 2 tabs per  day on Mon & Fri.       Follow-up Information   Follow up with [facility]. (Registered Nurse and Physical Therapy services to start within 24-48 hours of discharge)    Contact information:   [address] [place] [address] [phone]       Follow up with [facility] On 04/26/2014. (@ 8:45 am in the [facility]. Clinic is located in the hospital. [place]. Please bring all your medications to your visit.)    Specialty:  Cardiology   Contact information:   [address] [id number] [place] [address] [phone]      Follow up with Lanette Hampshire, MD. Schedule an appointment as soon as possible for a visit in 1 week.   Specialty:  Family Medicine   Contact information:   [address] [place] [address] [phone]        The results of significant diagnostics from this hospitalization (including imaging, microbiology, ancillary and laboratory) are listed below for reference.    Significant Diagnostic Studies: Dg Chest 2 View  04/03/2014   CLINICAL DATA:  Shortness of breath, weakness.  EXAM: CHEST  2 VIEW  COMPARISON:  10/03/2011  FINDINGS: Left AICD remains in place, unchanged. Cardiomegaly. No overt edema. Small right pleural effusion. No confluent opacity. No acute bony abnormality.  IMPRESSION: Cardiomegaly.  Small right pleural effusion.  No overt failure.   Electronically Signed   By: Rolm Baptise M.D.   On: 04/03/2014 10:51   Nm Hepatobiliary  04/04/2014   CLINICAL DATA:  Nausea, vomiting and thickened gallbladder wall by ultrasound.  EXAM: NUCLEAR MEDICINE HEPATOBILIARY IMAGING  TECHNIQUE: Sequential images of the abdomen were obtained out to 60 minutes following intravenous administration of radiopharmaceutical.  RADIOPHARMACEUTICALS:  5.0 Millicurie MH-96Q Choletec  COMPARISON:  Right upper quadrant ultrasound on 04/03/2014  FINDINGS: There is normal  hepatic uptake of radiopharmaceutical. Normal visualization of the gallbladder at 15 min. There is no evidence of biliary obstruction with activity noted to transit throughout the small bowel.  IMPRESSION: Normal hepatobiliary nuclear medicine scan demonstrating no evidence of cholecystitis or biliary obstruction. Ultrasound findings may have related to relative contraction of the gallbladder at the time of imaging.   Electronically Signed   By: Aletta Edouard M.D.   On: 04/04/2014 12:42   Dg Chest Port 1 View  04/19/2014   CLINICAL DATA:  PICC line placement.  EXAM: PORTABLE CHEST - 1 VIEW  COMPARISON:  04/03/2014.  FINDINGS: Interim placement of PICC line. Its tip is at the cavoatrial junction. Severe cardiomegaly. Cardiac pacer in stable position. No pulmonary venous congestion. No focal pulmonary infiltrate. No pneumothorax. No acute bony abnormality  IMPRESSION: 1. Interval placement of PICC line, its tip is at the caval atrial junction. 2. Severe cardiomegaly, stable. No pulmonary venous congestion. Cardiac pacer in stable position. 3. No acute pulmonary disease.   Electronically Signed   By: Marcello Moores  Register   On: 04/19/2014 11:55   US Abdomen Limited Ruq  04/03/2014   CLINICAL DATA:  Elevated LFTs, vomiting  EXAM: US ABDOMEN LIMITED - RIGHT UPPER QUADRANT  COMPARISON:  None.  FINDINGS: Gallbladder:  No gallstones are identified. Wall thickening is identified however with edema within the gallbladder wall. A negative sonographic Percell Miller sign is noted.  Common bile duct:  Diameter: 3.4 mm.  Liver:  No focal lesion identified. Within normal limits in parenchymal echogenicity.  Small right-sided pleural effusion is seen.  IMPRESSION: Thickened edematous gallbladder wall without definitive gallstones.   Electronically Signed   By: Inez Catalina M.D.   On: 04/03/2014 12:13    Microbiology: No results found for this or any previous visit (from the past 240 hour(s)).   Labs: Basic Metabolic  Panel:  Recent Labs Lab 04/17/14 0425 04/18/14 0518 04/19/14 0411 04/20/14 0415 04/21/14 0510  NA 138 144 139 141 142  K 3.9 4.2 3.1* 3.0* 3.6*  CL 102 106 98 99 99  CO2 $Re'22 25 25 28 28  'Nqa$ GLUCOSE 90 86 94 93 99  BUN 50* 49* 44* 45* 43*  CREATININE 2.40* 2.32* 2.09* 2.11* 2.21*  CALCIUM 8.9 9.2 9.2 9.5 9.6   Liver Function Tests: No results found for this basename: AST, ALT, ALKPHOS, BILITOT, PROT, ALBUMIN,  in the last 168 hours No results found for this basename: LIPASE, AMYLASE,  in the last 168 hours No results found for this basename: AMMONIA,  in the last 168 hours CBC:  Recent Labs Lab 04/17/14 0425  WBC 6.0  HGB 10.5*  HCT 31.6*  MCV 86.8  PLT 248   Cardiac Enzymes: No results found for this basename: CKTOTAL, CKMB, CKMBINDEX, TROPONINI,  in the last 168 hours BNP: BNP (last 3 results)  Recent Labs  04/03/14 1056 04/05/14 1312 04/09/14 0424  PROBNP 19030.0* 5310.0* 3361.0*   CBG: No results found for this basename: GLUCAP,  in the last 168 hours   Signed:  Vernell Leep, MD, FACP, FHM. [facility] Pager [phone]  If 7PM-7AM, please contact night-coverage [email or web address] Password [email or web address] 04/21/2014, 3:58 PM

## 2014-04-21 NOTE — Progress Notes (Signed)
ANTICOAGULATION CONSULT NOTE - Follow-up Consult  Pharmacy Consult for Coumadin Indication: atrial fibrillation  Allergies  Allergen Reactions  . Tuberculin Tests Swelling    Patient Measurements: Height: 5\' 7"  (170.2 cm) Weight: 168 lb 14 oz (76.6 kg) (scale c) IBW/kg (Calculated) : 61.6   Vital Signs: Temp: 97.8 F (36.6 C) (08/21 0500) Temp src: Oral (08/21 0500) BP: 104/58 mmHg (08/21 1035) Pulse Rate: 77 (08/21 1035)  Labs:  Recent Labs  04/19/14 0411 04/20/14 0415 04/21/14 0510  LABPROT 27.5* 27.2* 24.6*  INR 2.56* 2.52* 2.22*  CREATININE 2.09* 2.11* 2.21*    Estimated Creatinine Clearance: 22.4 ml/min (by C-G formula based on Cr of 2.21).   Assessment: 80 yo female transferred from [facility] 8/3 for surgical evaluation for possible cholecystitis. Coumadin for afib was on hold in case surgery needed. HIDA scan negative on 8/5.  INR is now therapeutic 2.21.  Of note, patient was started on amiodarone on 8/10 which may potentiate the effects of Coumadin so may require lower dose. H/H low but stable, plt wnl with no reported s/s bleeding.  Home dose: 5mg  daily except for 7.5mg  on Thur. Last home dose 7/31.  Goal of Therapy:  INR: 2-3 Monitor platelets by anticoagulation protocol: Yes   Plan:  - Coumadin 2.5 mg PO daily except for 5mg  MF - Continue daily INR - Monitor CBC, s/s bleeding  Leota Sauers Pharm.D. CPP, BCPS Clinical Pharmacist [phone] 04/21/2014 12:04 PM

## 2014-04-24 ENCOUNTER — Telehealth (HOSPITAL_COMMUNITY): Payer: Self-pay | Admitting: Vascular Surgery

## 2014-04-24 NOTE — Telephone Encounter (Signed)
[name] [facility] wants a order for occupational therapy .Marland Kitchen Please advise

## 2014-04-26 ENCOUNTER — Ambulatory Visit (HOSPITAL_COMMUNITY)
Admit: 2014-04-26 | Discharge: 2014-04-26 | Disposition: A | Discharge status: Home / Self Care | Payer: Medicare HMO | Source: Ambulatory Visit | Attending: Internal Medicine | Admitting: Internal Medicine

## 2014-04-26 ENCOUNTER — Encounter (HOSPITAL_COMMUNITY): Payer: Self-pay

## 2014-04-26 VITALS — BP 122/68 | HR 79 | Resp 18 | Wt 175.0 lb

## 2014-04-26 DIAGNOSIS — N184 Chronic kidney disease, stage 4 (severe): Secondary | ICD-10-CM | POA: Insufficient documentation

## 2014-04-26 DIAGNOSIS — I2510 Atherosclerotic heart disease of native coronary artery without angina pectoris: Secondary | ICD-10-CM | POA: Insufficient documentation

## 2014-04-26 DIAGNOSIS — I4891 Unspecified atrial fibrillation: Secondary | ICD-10-CM | POA: Diagnosis not present

## 2014-04-26 DIAGNOSIS — I509 Heart failure, unspecified: Secondary | ICD-10-CM | POA: Insufficient documentation

## 2014-04-26 DIAGNOSIS — M129 Arthropathy, unspecified: Secondary | ICD-10-CM | POA: Insufficient documentation

## 2014-04-26 DIAGNOSIS — I059 Rheumatic mitral valve disease, unspecified: Secondary | ICD-10-CM | POA: Diagnosis not present

## 2014-04-26 DIAGNOSIS — I428 Other cardiomyopathies: Secondary | ICD-10-CM | POA: Insufficient documentation

## 2014-04-26 DIAGNOSIS — M159 Polyosteoarthritis, unspecified: Secondary | ICD-10-CM | POA: Diagnosis not present

## 2014-04-26 DIAGNOSIS — R5383 Other fatigue: Secondary | ICD-10-CM | POA: Diagnosis present

## 2014-04-26 DIAGNOSIS — I482 Chronic atrial fibrillation, unspecified: Secondary | ICD-10-CM

## 2014-04-26 DIAGNOSIS — I749 Embolism and thrombosis of unspecified artery: Secondary | ICD-10-CM | POA: Insufficient documentation

## 2014-04-26 DIAGNOSIS — R5381 Other malaise: Secondary | ICD-10-CM | POA: Diagnosis present

## 2014-04-26 DIAGNOSIS — D649 Anemia, unspecified: Secondary | ICD-10-CM | POA: Diagnosis not present

## 2014-04-26 DIAGNOSIS — Z95810 Presence of automatic (implantable) cardiac defibrillator: Secondary | ICD-10-CM | POA: Insufficient documentation

## 2014-04-26 DIAGNOSIS — Z7901 Long term (current) use of anticoagulants: Secondary | ICD-10-CM | POA: Diagnosis not present

## 2014-04-26 DIAGNOSIS — K759 Inflammatory liver disease, unspecified: Secondary | ICD-10-CM | POA: Diagnosis not present

## 2014-04-26 DIAGNOSIS — M109 Gout, unspecified: Secondary | ICD-10-CM | POA: Diagnosis present

## 2014-04-26 DIAGNOSIS — M549 Dorsalgia, unspecified: Secondary | ICD-10-CM | POA: Diagnosis not present

## 2014-04-26 DIAGNOSIS — I10 Essential (primary) hypertension: Secondary | ICD-10-CM

## 2014-04-26 DIAGNOSIS — I129 Hypertensive chronic kidney disease with stage 1 through stage 4 chronic kidney disease, or unspecified chronic kidney disease: Secondary | ICD-10-CM | POA: Insufficient documentation

## 2014-04-26 DIAGNOSIS — I079 Rheumatic tricuspid valve disease, unspecified: Secondary | ICD-10-CM | POA: Diagnosis not present

## 2014-04-26 DIAGNOSIS — I471 Supraventricular tachycardia, unspecified: Secondary | ICD-10-CM | POA: Insufficient documentation

## 2014-04-26 DIAGNOSIS — G8929 Other chronic pain: Secondary | ICD-10-CM | POA: Diagnosis not present

## 2014-04-26 DIAGNOSIS — I5022 Chronic systolic (congestive) heart failure: Secondary | ICD-10-CM | POA: Diagnosis not present

## 2014-04-26 DIAGNOSIS — R0602 Shortness of breath: Secondary | ICD-10-CM | POA: Diagnosis present

## 2014-04-26 DIAGNOSIS — E119 Type 2 diabetes mellitus without complications: Secondary | ICD-10-CM | POA: Insufficient documentation

## 2014-04-26 DIAGNOSIS — Z8673 Personal history of transient ischemic attack (TIA), and cerebral infarction without residual deficits: Secondary | ICD-10-CM | POA: Diagnosis not present

## 2014-04-26 HISTORY — DX: Chronic systolic (congestive) heart failure: I50.22

## 2014-04-26 HISTORY — DX: Chronic kidney disease, stage 4 (severe): N18.4

## 2014-04-26 HISTORY — DX: Other specified conduction disorders: I45.89

## 2014-04-26 MED ORDER — COLCHICINE 0.6 MG PO TABS
0.3000 mg | ORAL_TABLET | Freq: Every day | ORAL | Status: DC
Start: 2014-04-26 — End: 2014-05-22

## 2014-04-26 MED ORDER — ATORVASTATIN CALCIUM 20 MG PO TABS: 20.0000 mg | ORAL_TABLET | Freq: Every day | ORAL | Status: DC

## 2014-04-26 NOTE — Progress Notes (Addendum)
Patient ID: [name], female   DOB: [birth year], 80 y.o.   MRN: [id number] PCP: Dr. [name] Primary Cardiologist: Dr. Ladona Ridgel  HPI: [name] is a 80 yo female with a history of NICM s/p BiV ICD, HTN, chronic systolic HF, permanent afib (s/p AVN ablation 2010 and again repeat 2015) CVA, DM2 and CKD stage IV.  ECHO (04/2014): EF 15-20%, grade III DD, severe MR, biatrial enlargement, mod TR  Admitted 04/2014 with increased SOB and fatigue. ICD interrogated and showed 1/3 of ventricular beats actually conducted through AV node limiting BiV pacing and underwent repeat AV node ablation. Had RHC showing low output and started on milrinone. Failed milrinone wean. Loaded with amiodarone for potential DC-CV.   Post Hospital Follow up: Doing ok since being home. Reports she is still a little weak but is getting a little stronger. Minimal complaints of gout. SOB somewhat improved but still has some. Not very active. Denies PND or CP. +orthopnea and LE edema. Following a low salt diet and drinking less than 2L a day. Having pain in feet. Hast not been weighing daily.   ROS: All systems negative except as listed in HPI, PMH and Problem List.  SH:  History   Social History  . Marital Status: Married    Spouse Name: N/A    Number of Children: N/A  . Years of Education: N/A   Occupational History  . Not on file.   Social History Main Topics  . Smoking status: Never Smoker   . Smokeless tobacco: Never Used  . Alcohol Use: No  . Drug Use: No  . Sexual Activity: No   Other Topics Concern  . Not on file   Social History Narrative   Married, retired, does not get regular exercise, no caffeine.     FH:  Family History  Problem Relation Age of Onset  . Diabetes Brother   . Heart attack Sister 62    also has liver problems     Past Medical History  Diagnosis Date  . Nonischemic cardiomyopathy     a) Initial dx 1992 [name]) nl coronary angios in 1/03 c) AICD/bivent. PM - 4/09;     . Hypertension   . CKD (chronic kidney disease), stage IV   . PSVT (paroxysmal supraventricular tachycardia)     s/p radiofrequency ablation  . Gout     uric acid of 10 6/11  . Anemia   . Thrombus     left upper extremity; following device insertion  . Chronic anticoagulation   . Coronary artery disease   . Chronic systolic heart failure     a) NICM [name]) EF 40-45% (01/2009) c) EF 20-25% (04/2014) d) RHC (04/2014): RA 9, RV 34/2/6, PA 34/16 (24), PCWP: 12, Fick CO/CI: 3.6 /1.8, PA sat 50% and 52%  . Automatic implantable cardioverter-defibrillator in situ   . Hepatitis 1962    "don't know which kind"  . CVA (cerebral vascular accident) 1992    "memory not always clear since"  . DJD (degenerative joint disease)     knees; right hip  . Arthritis     "knees, back" (04/03/2014)  . Chronic mid back pain   . AV node dysfunction     a) AV node ablation in 2010 and repeat 2015    Current Outpatient Prescriptions  Medication Sig Dispense Refill  . acetaminophen (TYLENOL) 325 MG tablet Take 2 tablets (650 mg total) by mouth every 4 (four) hours as needed for mild pain, moderate pain,  fever or headache.      Marland Kitchen amiodarone (PACERONE) 200 MG tablet Take 1 tablet (200 mg total) by mouth 2 (two) times daily.  60 tablet  0  . atorvastatin (LIPITOR) 20 MG tablet Take 20 mg by mouth daily.      . bisacodyl (DULCOLAX) 10 MG suppository Place 1 suppository (10 mg total) rectally daily as needed for moderate constipation.  12 suppository  0  . colchicine 0.6 MG tablet Take 0.5 tablets (0.3 mg total) by mouth daily.      Tery Sanfilippo Sodium (DSS) 100 MG CAPS Take 100 mg by mouth daily as needed (mild constipation).  30 each  0  . Febuxostat (ULORIC) 80 MG TABS Take 1 tablet by mouth daily.      . ferrous sulfate 325 (65 FE) MG tablet Take 1 tablet (325 mg total) by mouth 2 (two) times daily with a meal.  60 tablet  0  . furosemide (LASIX) 40 MG tablet Take 1.5 tablets (60 mg total) by mouth daily.  60 tablet   0  . hydrALAZINE (APRESOLINE) 25 MG tablet Take 1 tablet (25 mg total) by mouth 3 (three) times daily.  90 tablet  0  . isosorbide mononitrate (IMDUR) 60 MG 24 hr tablet Take 1 tablet (60 mg total) by mouth daily.  30 tablet  0  . milrinone (PRIMACOR) 20 MG/100ML SOLN infusion Inject 20.45 mcg/min into the vein continuous. 0.25 mcg/kg/min  100 mL  0  . Multiple Vitamin (MULTIVITAMIN) tablet Take 0.5 tablets by mouth daily.       . nitroGLYCERIN (NITROSTAT) 0.4 MG SL tablet Place 0.4 mg under the tongue every 5 (five) minutes as needed for chest pain.       . Omega-3 Fatty Acids (FISH OIL) 1000 MG CAPS Take 1 capsule by mouth daily.      . potassium chloride SA (K-DUR,KLOR-CON) 20 MEQ tablet Take 2 tablets (40 mEq total) by mouth daily.  60 tablet  0  . warfarin (COUMADIN) 2.5 MG tablet Take 1 tablet per day on Sun Tue Wed Thu Sat and 2 tabs per day on Mon & Fri.  60 tablet  0   No current facility-administered medications for this encounter.    Filed Vitals:   04/26/14 0909  BP: 122/68  Pulse: 79  Resp: 18  Weight: 175 lb (79.379 kg)  SpO2: 99%    PHYSICAL EXAM:  General: Chronically ill appearing. No resp difficulty, used walker, husband present HEENT: normal Neck: supple. JVP 8, Carotids 2+ bilaterally; no bruits. No lymphadenopathy or thryomegaly appreciated. Cor: PMI normal. Regular rate & rhythm. +S3, 2/6 SEM Lungs: diminished in the bases Abdomen: soft, nontender, nondistended. No hepatosplenomegaly. No bruits or masses. Good bowel sounds. Extremities: no cyanosis, clubbing, rash, trace bilateral edema Neuro: alert & orientedx3, cranial nerves grossly intact. Moves all 4 extremities w/o difficulty. Affect pleasant.   ASSESSMENT & PLAN:  1) Chronic systolic HF: NICM s/p BiV, EF 45-40% (04/2014) - Recently discharged from the hospital for A/C HF. She was started on home inotropes, milrinone 0.25 mcg through PICC. - NYHA III-IIIb symptoms and volume status mildly elevated.  Will continue lasix 60 mg daily and discussed the use of sliding scale diuretics. - She is not on BB with recent cardiogenic shock. - No ACE-I or Cleda Daub with CKD. - Continue hydralazine 25 mg TID and Imdur 60 mg daily for afterload reduction. - Awaiting labs from [facility] - Reinforced the need and importance of daily weights, a  low sodium diet, and fluid restriction (less than 2 L a day). Instructed to call the HF clinic if weight increases more than 3 lbs overnight or 5 lbs in a week.  2) HTN - Stable, continue current medications 3) Permanent Afib: s/p AV node ablation 2010 and repeat 2015 - During last hospitalization underwent repeat AV node ablation. After procedure was having a junctional rhythm with ventricular ectopy with PVCs. Thought was to load with amiodarone and then try to restore NSR in 1 month. Will continue to follow but not sure if this is going to make much difference.  4) CKD stage IV - Baseline creatinine 2.1-2.4. Awaiting weekly BMET from [facility].   Spent over 40 minutes discussing the above with the patient and coordinating care. Called patients pharmacy for patient because they were confused about medications. Patient uses mail order pharmacy and was not sure when they were arriving or if they had correct meds from in hospital.   Brandi Potash B NP-C 1:50 PM

## 2014-04-26 NOTE — Patient Instructions (Signed)
Doing well.   Call any issues [phone].  You may drink up to 2L a day but if your weight starts trending up or you get more short of breath cut back to 32 oz.  Follow up in 3 week with MD.   Do the following things EVERYDAY: 1) Weigh yourself in the morning before breakfast. Write it down and keep it in a log. 2) Take your medicines as prescribed 3) Eat low salt foods-Limit salt (sodium) to 2000 mg per day.  4) Stay as active as you can everyday 5) Limit all fluids for the day to less than 2 liters 6)

## 2014-05-08 ENCOUNTER — Telehealth: Payer: Self-pay | Admitting: Physician Assistant

## 2014-05-08 NOTE — Telephone Encounter (Signed)
The patient is a 79 year old female with history of nonischemic cardiomyopathy status post biventricular ICD, hypertension, chronic systolic heart failure, permanent atrial fibrillation status post AV nodal ablation recently, CVA, diabetes, chronic kidney disease stage IV. The patient has a baseline EF of 15-20% based on echocardiogram obtained in August 2015 and also had severe MR. [facility] afterhour answering service received a page from the patient's husband stating the patient has gained 5 pounds since last week. Her current weight is 179 pounds this morning. Her previous weight a week ago was 174. She is only mild shortness of breath, however is still able to lay flat. She also had mild lower extremity edema. Patient has been taking 60 mg daily of Lasix at home. I have advised the patient's husband to give her an extra dose of 60 mg Lasix for today and tomorrow before going back to her daily schedule.  Ramond Dial PA Pager: [phone]

## 2014-05-09 ENCOUNTER — Telehealth (HOSPITAL_COMMUNITY): Payer: Self-pay | Admitting: Vascular Surgery

## 2014-05-09 MED ORDER — METOLAZONE 2.5 MG PO TABS: 2.5000 mg | ORAL_TABLET | ORAL | Status: DC

## 2014-05-09 MED ORDER — HYDRALAZINE HCL 25 MG PO TABS: 25.0000 mg | ORAL_TABLET | Freq: Three times a day (TID) | ORAL | Status: DC

## 2014-05-09 MED ORDER — AMIODARONE HCL 200 MG PO TABS: 200.0000 mg | ORAL_TABLET | Freq: Two times a day (BID) | ORAL | Status: DC

## 2014-05-09 MED ORDER — POTASSIUM CHLORIDE CRYS ER 20 MEQ PO TBCR: 60.0000 meq | EXTENDED_RELEASE_TABLET | Freq: Every day | ORAL | Status: DC

## 2014-05-09 MED ORDER — FUROSEMIDE 40 MG PO TABS: 60.0000 mg | ORAL_TABLET | Freq: Every day | ORAL | Status: DC

## 2014-05-09 MED ORDER — ISOSORBIDE MONONITRATE ER 60 MG PO TB24: 60.0000 mg | ORAL_TABLET | Freq: Every day | ORAL | Status: AC

## 2014-05-09 NOTE — Telephone Encounter (Signed)
Pt spoke w/on call md yesterday and they were instructed to increase lasix to 60 mg for yesterday and today and pt's wt has increased from 179 yesterday to 181 lb, he states she is a little SOB with activity and has slight edema in feet.  Received labs from [facility] bun 26 cr 2.22 K 3.3 per Ulla Potash, NP give pt metolazone 2.5 mg for 2 days and increase KCL to 60 meq daily.  Pt's husband is aware and agreeable

## 2014-05-09 NOTE — Telephone Encounter (Signed)
Pt husband left message with answering service  Pt gained 5 lbs this past week.. Please advise

## 2014-05-19 ENCOUNTER — Encounter: Payer: Self-pay | Admitting: Family Medicine

## 2014-05-19 ENCOUNTER — Encounter: Payer: Self-pay | Admitting: Internal Medicine

## 2014-05-22 ENCOUNTER — Encounter (HOSPITAL_COMMUNITY): Payer: Self-pay

## 2014-05-22 ENCOUNTER — Ambulatory Visit (HOSPITAL_COMMUNITY)
Admission: RE | Admit: 2014-05-22 | Discharge: 2014-05-22 | Disposition: A | Discharge status: Home / Self Care | Payer: Medicare HMO | Source: Ambulatory Visit | Attending: Internal Medicine | Admitting: Internal Medicine

## 2014-05-22 VITALS — BP 116/62 | HR 73 | Wt 180.8 lb

## 2014-05-22 DIAGNOSIS — Z95810 Presence of automatic (implantable) cardiac defibrillator: Secondary | ICD-10-CM | POA: Diagnosis not present

## 2014-05-22 DIAGNOSIS — Z7901 Long term (current) use of anticoagulants: Secondary | ICD-10-CM | POA: Insufficient documentation

## 2014-05-22 DIAGNOSIS — N183 Chronic kidney disease, stage 3 unspecified: Secondary | ICD-10-CM | POA: Diagnosis not present

## 2014-05-22 DIAGNOSIS — E119 Type 2 diabetes mellitus without complications: Secondary | ICD-10-CM | POA: Insufficient documentation

## 2014-05-22 DIAGNOSIS — I5022 Chronic systolic (congestive) heart failure: Secondary | ICD-10-CM

## 2014-05-22 DIAGNOSIS — I509 Heart failure, unspecified: Secondary | ICD-10-CM | POA: Insufficient documentation

## 2014-05-22 DIAGNOSIS — I5023 Acute on chronic systolic (congestive) heart failure: Secondary | ICD-10-CM | POA: Insufficient documentation

## 2014-05-22 DIAGNOSIS — I129 Hypertensive chronic kidney disease with stage 1 through stage 4 chronic kidney disease, or unspecified chronic kidney disease: Secondary | ICD-10-CM | POA: Diagnosis not present

## 2014-05-22 DIAGNOSIS — I4891 Unspecified atrial fibrillation: Secondary | ICD-10-CM | POA: Insufficient documentation

## 2014-05-22 DIAGNOSIS — Z8673 Personal history of transient ischemic attack (TIA), and cerebral infarction without residual deficits: Secondary | ICD-10-CM | POA: Insufficient documentation

## 2014-05-22 DIAGNOSIS — I482 Chronic atrial fibrillation, unspecified: Secondary | ICD-10-CM

## 2014-05-22 DIAGNOSIS — M109 Gout, unspecified: Secondary | ICD-10-CM | POA: Insufficient documentation

## 2014-05-22 DIAGNOSIS — I428 Other cardiomyopathies: Secondary | ICD-10-CM | POA: Diagnosis not present

## 2014-05-22 LAB — HEPATIC FUNCTION PANEL
ALBUMIN: 3.1 g/dL — AB (ref 3.5–5.2)
ALT: 29 U/L (ref 0–35)
AST: 38 U/L — AB (ref 0–37)
Alkaline Phosphatase: 125 U/L — ABNORMAL HIGH (ref 39–117)
Bilirubin, Direct: 0.3 mg/dL (ref 0.0–0.3)
Indirect Bilirubin: 0.5 mg/dL (ref 0.3–0.9)
Total Bilirubin: 0.8 mg/dL (ref 0.3–1.2)
Total Protein: 7.1 g/dL (ref 6.0–8.3)

## 2014-05-22 LAB — PROTIME-INR
INR: 3 — AB (ref 0.00–1.49)
PROTHROMBIN TIME: 31.1 s — AB (ref 11.6–15.2)

## 2014-05-22 LAB — TSH: TSH: 2.84 u[IU]/mL (ref 0.350–4.500)

## 2014-05-22 MED ORDER — COLCHICINE 0.6 MG PO TABS: 0.6000 mg | ORAL_TABLET | Freq: Every day | ORAL | Status: DC

## 2014-05-22 MED ORDER — SPIRONOLACTONE 25 MG PO TABS: 12.5000 mg | ORAL_TABLET | Freq: Every day | ORAL | Status: DC

## 2014-05-22 MED ORDER — POTASSIUM CHLORIDE CRYS ER 20 MEQ PO TBCR: 40.0000 meq | EXTENDED_RELEASE_TABLET | Freq: Two times a day (BID) | ORAL | Status: DC

## 2014-05-22 MED ORDER — FUROSEMIDE 40 MG PO TABS: 80.0000 mg | ORAL_TABLET | Freq: Every day | ORAL | Status: DC

## 2014-05-22 NOTE — Progress Notes (Signed)
Patient ID: [name], female   DOB: March 08, 1933, 80 y.o.   MRN: [id number] PCP: Dr. [name] Primary Cardiologist: Dr. Ladona Ridgel  HPI: [name] is a 80 yo female with a history of NICM s/p BiV ICD, HTN, chronic systolic HF, permanent afib (s/p AVN ablation 2010 and again repeat 2015), CVA, DM2 and CKD stage IV.  ECHO (04/2014): EF 15-20%, grade III DD, severe MR, biatrial enlargement, mod TR  Admitted 04/2014 with increased SOB and fatigue. ICD interrogated and showed 1/3 of ventricular beats actually conducted through AV node limiting BiV pacing and underwent repeat AV node ablation. Had RHC showing low output and started on milrinone. Failed milrinone wean. Loaded with amiodarone for potential DC-CV.   Weight is up 5 lbs compared to prior appointment.  She remains in atrial fibrillation.  She has left ankle/foot gout symptoms that have flared up and she is out of colchicine.  Has "stayed in bed" for 3 days because of gout pain.  She has mild dyspnea walking around her house but says this is improved.  Plan as above had been for cardioversion but she has not had an INR drawn since August.    ECG: afib, v-paced  Labs (9/15): K 3.0, creatinine 2.07 => 1.9, HCT 32.7  ROS: All systems negative except as listed in HPI, PMH and Problem List.  SH:  History   Social History  . Marital Status: Married    Spouse Name: N/A    Number of Children: N/A  . Years of Education: N/A   Occupational History  . Not on file.   Social History Main Topics  . Smoking status: Never Smoker   . Smokeless tobacco: Never Used  . Alcohol Use: No  . Drug Use: No  . Sexual Activity: No   Other Topics Concern  . Not on file   Social History Narrative   Married, retired, does not get regular exercise, no caffeine.     FH:  Family History  Problem Relation Age of Onset  . Diabetes Brother   . Heart attack Sister 73    also has liver problems   . Heart attack Mother     deceased     Past Medical History  Diagnosis Date  . Nonischemic cardiomyopathy     a) Initial dx 1992 b) nl coronary angios in 1/03 c) AICD/bivent. PM - 4/09;    . Hypertension   . CKD (chronic kidney disease), stage IV   . PSVT (paroxysmal supraventricular tachycardia)     s/p radiofrequency ablation  . Gout     uric acid of 10 6/11  . Anemia   . Thrombus     left upper extremity; following device insertion  . Chronic anticoagulation   . Coronary artery disease   . Chronic systolic heart failure     a) NICM b) EF 40-45% (01/2009) c) EF 20-25% (04/2014) d) RHC (04/2014): RA 9, RV 34/2/6, PA 34/16 (24), PCWP: 12, Fick CO/CI: 3.6 /1.8, PA sat 50% and 52%  . Automatic implantable cardioverter-defibrillator in situ   . Hepatitis 1962    "don't know which kind"  . CVA (cerebral vascular accident) 1992    "memory not always clear since"  . DJD (degenerative joint disease)     knees; right hip  . Arthritis     "knees, back" (04/03/2014)  . Chronic mid back pain   . AV node dysfunction     a) AV node ablation in 2010 and repeat 2015  Current Outpatient Prescriptions  Medication Sig Dispense Refill  . acetaminophen (TYLENOL) 325 MG tablet Take 2 tablets (650 mg total) by mouth every 4 (four) hours as needed for mild pain, moderate pain, fever or headache.      . allopurinol (ZYLOPRIM) 100 MG tablet Take 100 mg by mouth daily.      Marland Kitchen amiodarone (PACERONE) 200 MG tablet Take 1 tablet (200 mg total) by mouth 2 (two) times daily.  180 tablet  1  . atorvastatin (LIPITOR) 20 MG tablet Take 1 tablet (20 mg total) by mouth daily.  90 tablet  3  . bisacodyl (DULCOLAX) 10 MG suppository Place 1 suppository (10 mg total) rectally daily as needed for moderate constipation.  12 suppository  0  . colchicine 0.6 MG tablet Take 1 tablet (0.6 mg total) by mouth daily.  15 tablet  1  . ferrous sulfate 325 (65 FE) MG tablet Take 1 tablet (325 mg total) by mouth 2 (two) times daily with a meal.  60 tablet  0  .  furosemide (LASIX) 40 MG tablet Take 2 tablets (80 mg total) by mouth daily.  180 tablet  1  . hydrALAZINE (APRESOLINE) 25 MG tablet Take 1 tablet (25 mg total) by mouth 3 (three) times daily.  270 tablet  1  . isosorbide mononitrate (IMDUR) 60 MG 24 hr tablet Take 1 tablet (60 mg total) by mouth daily.  90 tablet  1  . metolazone (ZAROXOLYN) 2.5 MG tablet Take 1 tablet (2.5 mg total) by mouth as directed.  5 tablet  3  . milrinone (PRIMACOR) 20 MG/100ML SOLN infusion Inject 20.45 mcg/min into the vein continuous. 0.25 mcg/kg/min  100 mL  0  . Multiple Vitamin (MULTIVITAMIN) tablet Take 0.5 tablets by mouth daily.       . Omega-3 Fatty Acids (FISH OIL) 1000 MG CAPS Take 1 capsule by mouth daily.      . potassium chloride SA (K-DUR,KLOR-CON) 20 MEQ tablet Take 2 tablets (40 mEq total) by mouth 2 (two) times daily.  270 tablet  1  . warfarin (COUMADIN) 2.5 MG tablet Take 1 tablet per day on Sun Tue Wed Thu Sat and 2 tabs per day on Mon & Fri.  60 tablet  0  . Docusate Sodium (DSS) 100 MG CAPS Take 100 mg by mouth daily as needed (mild constipation).  30 each  0  . nitroGLYCERIN (NITROSTAT) 0.4 MG SL tablet Place 0.4 mg under the tongue every 5 (five) minutes as needed for chest pain.       Marland Kitchen spironolactone (ALDACTONE) 25 MG tablet Take 0.5 tablets (12.5 mg total) by mouth daily.  45 tablet  6   No current facility-administered medications for this encounter.    Filed Vitals:   05/22/14 0904  BP: 116/62  Pulse: 73  Weight: 180 lb 12.8 oz (82.01 kg)  SpO2: 99%    PHYSICAL EXAM:  General: Chronically ill appearing. No resp difficulty, used walker, husband present HEENT: normal Neck: supple. JVP 8, Carotids 2+ bilaterally; no bruits. No lymphadenopathy or thryomegaly appreciated. Cor: PMI normal. Regular rate & rhythm. 2/6 SEM Lungs: diminished in the bases Abdomen: soft, nontender, nondistended. No hepatosplenomegaly. No bruits or masses. Good bowel sounds. Extremities: no cyanosis,  clubbing, rash.  1+ ankle edema, L>R Neuro: alert & orientedx3, cranial nerves grossly intact. Moves all 4 extremities w/o difficulty. Affect pleasant.   ASSESSMENT & PLAN:  1) Chronic systolic HF: Nonischemic CMP.  EF 20-25% (04/2014).  She has BiV-ICD  and is s/p AV nodal ablation to promote BiV pacing.  On exam today, she is mildly volume overloaded and weight is up 5 lbs.  NYHA class III symptoms.  - Continue home milrinone 0.25 with weekly labs.  - Increase Lasix to 80 mg daily.  Will also increase KCl to 40 bid.  - Add spironolactone 12.5 mg daily.  - She is not on BB with recent cardiogenic shock. - No ACE-I with CKD. - Continue hydralazine 25 mg TID and Imdur 60 mg daily for afterload reduction. - Reinforced the need and importance of daily weights, a low sodium diet, and fluid restriction (less than 2 L a day). Instructed to call the HF clinic if weight increases more than 3 lbs overnight or 5 lbs in a week.  2) Afib: Persistent, s/p AV node ablation 2010 and repeat 2015.  Plan had been for attempted DCCV on amiodarone to get her back into NSR to see if this helps her cardiac output.  Unfortunately, she has had no INRs since 8/15 so cannot safely cardiovert at this time.  Will check INR today and then start weekly INRs with [facility].  If she remains therapeutic, plan DCCV in 1 month.  - Needs LFTs and TSH with amiodarone, will need regular eye exams if continues long-term.   3) CKD stage IV: Most recent creatinine 1.9, stable.  4) Gout: Acute flare, restart colchicine.   Followup in 2 wks.    Marca Ancona 05/22/2014 10:37 AM

## 2014-05-22 NOTE — Patient Instructions (Signed)
Labs today.  START Spironolactone 12.5 mg daily INCREASE Potassium to 40 MeQ twice a day START Colchicine 0.6mg  daily INCREASE Lasix to 80 mg daily  Your physician recommends that you schedule a follow-up appointment in: 2 weeks  Do the following things EVERYDAY: 1) Weigh yourself in the morning before breakfast. Write it down and keep it in a log. 2) Take your medicines as prescribed 3) Eat low salt foods-Limit salt (sodium) to 2000 mg per day.  4) Stay as active as you can everyday 5) Limit all fluids for the day to less than 2 liters 6)

## 2014-05-23 NOTE — Addendum Note (Signed)
Encounter addended by: Simon Rhein, CCT on: 05/23/2014  8:37 AM<BR>     Documentation filed: Charges VN

## 2014-05-25 ENCOUNTER — Telehealth (HOSPITAL_COMMUNITY): Payer: Self-pay

## 2014-05-25 NOTE — Telephone Encounter (Signed)
Lab results reviewed with patient.  Instructed tot ake extra 40 meq potassium today, extra 20 meq potassium tomorrow, then resume as prescribed on Saturday.  Aware and agreeable. Ave Filter

## 2014-06-01 ENCOUNTER — Encounter: Payer: Self-pay | Admitting: Internal Medicine

## 2014-06-02 ENCOUNTER — Other Ambulatory Visit: Payer: Self-pay | Admitting: Adult Health

## 2014-06-02 ENCOUNTER — Telehealth (HOSPITAL_COMMUNITY): Payer: Self-pay | Admitting: Vascular Surgery

## 2014-06-02 NOTE — Telephone Encounter (Signed)
Returned call to nurse.  Patient had not increased her lasix to 80mg  daily, potassium to 40 meq daily, or started spironolactone 12.5 mg daily per Dr. Alford Highland orders/recommendations at her last office visit with [place] on 9/21.  Nurse Karoline Caldwell will review this with patient and have these medications adjusted to reflect correct regimen.  Asked to follow up Monday to follow up to see if this helps with edema. Ave Filter

## 2014-06-02 NOTE — Telephone Encounter (Signed)
[facility] called.. Increased Milronone rate 1.3. Plus 4 ankle, plus 2 bil leg lower leg.. No difference SOB.Marland Kitchen Please advise

## 2014-06-06 ENCOUNTER — Telehealth (HOSPITAL_COMMUNITY): Payer: Self-pay | Admitting: *Deleted

## 2014-06-06 NOTE — Telephone Encounter (Signed)
Received labs from [facility] K 3.9 bun 37 cr 2.73 which is elevated for pt, per Ulla Potash, NP have pt hold Lasix for 2 days if swelling has improved.  Called and spoke w/pt she states the edema in her legs is gone and her wt is down, she has not yet taken her medications this AM, she will hold Lasix today and tomorrow and resume Thursday, she verbalized understanding, pt is scheduled for weekly labs with [facility]

## 2014-06-07 ENCOUNTER — Ambulatory Visit (HOSPITAL_COMMUNITY)
Admission: RE | Admit: 2014-06-07 | Discharge: 2014-06-07 | Disposition: A | Discharge status: Home / Self Care | Payer: Medicare HMO | Source: Ambulatory Visit | Attending: Cardiology | Admitting: Cardiology

## 2014-06-07 ENCOUNTER — Encounter (HOSPITAL_COMMUNITY): Payer: Self-pay

## 2014-06-07 VITALS — BP 116/62 | HR 73 | Wt 194.8 lb

## 2014-06-07 DIAGNOSIS — Z7901 Long term (current) use of anticoagulants: Secondary | ICD-10-CM | POA: Insufficient documentation

## 2014-06-07 DIAGNOSIS — Z8673 Personal history of transient ischemic attack (TIA), and cerebral infarction without residual deficits: Secondary | ICD-10-CM | POA: Insufficient documentation

## 2014-06-07 DIAGNOSIS — I5022 Chronic systolic (congestive) heart failure: Secondary | ICD-10-CM | POA: Insufficient documentation

## 2014-06-07 DIAGNOSIS — I2510 Atherosclerotic heart disease of native coronary artery without angina pectoris: Secondary | ICD-10-CM | POA: Diagnosis not present

## 2014-06-07 DIAGNOSIS — Z79899 Other long term (current) drug therapy: Secondary | ICD-10-CM | POA: Diagnosis not present

## 2014-06-07 DIAGNOSIS — N183 Chronic kidney disease, stage 3 unspecified: Secondary | ICD-10-CM

## 2014-06-07 DIAGNOSIS — N184 Chronic kidney disease, stage 4 (severe): Secondary | ICD-10-CM | POA: Insufficient documentation

## 2014-06-07 DIAGNOSIS — Z86718 Personal history of other venous thrombosis and embolism: Secondary | ICD-10-CM | POA: Diagnosis not present

## 2014-06-07 DIAGNOSIS — E119 Type 2 diabetes mellitus without complications: Secondary | ICD-10-CM | POA: Diagnosis not present

## 2014-06-07 DIAGNOSIS — I481 Persistent atrial fibrillation: Secondary | ICD-10-CM | POA: Diagnosis not present

## 2014-06-07 DIAGNOSIS — I130 Hypertensive heart and chronic kidney disease with heart failure and stage 1 through stage 4 chronic kidney disease, or unspecified chronic kidney disease: Secondary | ICD-10-CM | POA: Insufficient documentation

## 2014-06-07 DIAGNOSIS — E8770 Fluid overload, unspecified: Secondary | ICD-10-CM | POA: Diagnosis not present

## 2014-06-07 DIAGNOSIS — Z95810 Presence of automatic (implantable) cardiac defibrillator: Secondary | ICD-10-CM | POA: Diagnosis not present

## 2014-06-07 DIAGNOSIS — I429 Cardiomyopathy, unspecified: Secondary | ICD-10-CM | POA: Diagnosis not present

## 2014-06-07 DIAGNOSIS — I482 Chronic atrial fibrillation, unspecified: Secondary | ICD-10-CM

## 2014-06-07 NOTE — Progress Notes (Signed)
Patient ID: [name], female   DOB: 1932/11/30, 80 y.o.   MRN: [id number] PCP: Dr. [name] Primary Cardiologist: Dr. Ladona Ridgel  HPI: [name] is a 80 yo female with a history of NICM s/p BiV ICD, HTN, chronic systolic HF, permanent afib (s/p AVN ablation 2010 and again repeat 2015), CVA, DM2 and CKD stage IV.  ECHO (04/2014): EF 15-20%, grade III DD, severe MR, biatrial enlargement, mod TR  Admitted 04/2014 with increased SOB and fatigue. ICD interrogated and showed 1/3 of ventricular beats actually conducted through AV node limiting BiV pacing and underwent repeat AV node ablation. Had RHC showing low output and started on milrinone. Failed milrinone wean. Loaded with amiodarone for potential DC-CV.   [name] returns for follow up. [name] continues on Milrinone at 0.25 mcg. Last Dr Shirlee Latch increased lasix to  80 mg daily and spironolactone 12.5 mg added. On October 6th diuretics held due to elevated creatinine.  Not using NSAIDs. SOB with exertion. Has dry cough. 188-191 pounds. Followed by [facility] for home milrinone and weekly labs.   Labs (9/15): K 3.0, creatinine 2.07 => 1.9, HCT 32.7 AST 38 ALT 29 TSH normal  Labs 06/06/14: K 3.9 Creatinine 2.73---> lasix held for 2 days.   ROS: All systems negative except as listed in HPI, PMH and Problem List.  SH:  History   Social History  . Marital Status: Married    Spouse Name: N/A    Number of Children: N/A  . Years of Education: N/A   Occupational History  . Not on file.   Social History Main Topics  . Smoking status: Never Smoker   . Smokeless tobacco: Never Used  . Alcohol Use: No  . Drug Use: No  . Sexual Activity: No   Other Topics Concern  . Not on file   Social History Narrative   Married, retired, does not get regular exercise, no caffeine.     FH:  Family History  Problem Relation Age of Onset  . Diabetes Brother   . Heart attack Sister 65    also has liver problems   . Heart attack Mother     deceased     Past Medical History  Diagnosis Date  . Nonischemic cardiomyopathy     a) Initial dx 1992 b) nl coronary angios in 1/03 c) AICD/bivent. PM - 4/09;    . Hypertension   . CKD (chronic kidney disease), stage IV   . PSVT (paroxysmal supraventricular tachycardia)     s/p radiofrequency ablation  . Gout     uric acid of 10 6/11  . Anemia   . Thrombus     left upper extremity; following device insertion  . Chronic anticoagulation   . Coronary artery disease   . Chronic systolic heart failure     a) NICM b) EF 40-45% (01/2009) c) EF 20-25% (04/2014) d) RHC (04/2014): RA 9, RV 34/2/6, PA 34/16 (24), PCWP: 12, Fick CO/CI: 3.6 /1.8, PA sat 50% and 52%  . Automatic implantable cardioverter-defibrillator in situ   . Hepatitis 1962    "don't know which kind"  . CVA (cerebral vascular accident) 1992    "memory not always clear since"  . DJD (degenerative joint disease)     knees; right hip  . Arthritis     "knees, back" (04/03/2014)  . Chronic mid back pain   . AV node dysfunction     a) AV node ablation in 2010 and repeat 2015    Current Outpatient  Prescriptions  Medication Sig Dispense Refill  . acetaminophen (TYLENOL) 325 MG tablet Take 2 tablets (650 mg total) by mouth every 4 (four) hours as needed for mild pain, moderate pain, fever or headache.      . allopurinol (ZYLOPRIM) 100 MG tablet Take 100 mg by mouth daily.      Marland Kitchen amiodarone (PACERONE) 200 MG tablet Take 1 tablet (200 mg total) by mouth 2 (two) times daily.  180 tablet  1  . atorvastatin (LIPITOR) 20 MG tablet Take 1 tablet (20 mg total) by mouth daily.  90 tablet  3  . bisacodyl (DULCOLAX) 10 MG suppository Place 1 suppository (10 mg total) rectally daily as needed for moderate constipation.  12 suppository  0  . colchicine 0.6 MG tablet Take 1 tablet (0.6 mg total) by mouth daily.  15 tablet  1  . Docusate Sodium (DSS) 100 MG CAPS Take 100 mg by mouth daily as needed (mild constipation).  30 each  0  . ferrous sulfate 325  (65 FE) MG tablet Take 1 tablet (325 mg total) by mouth 2 (two) times daily with a meal.  60 tablet  0  . furosemide (LASIX) 40 MG tablet Take 2 tablets (80 mg total) by mouth daily.  180 tablet  1  . hydrALAZINE (APRESOLINE) 25 MG tablet Take 1 tablet (25 mg total) by mouth 3 (three) times daily.  270 tablet  1  . isosorbide mononitrate (IMDUR) 60 MG 24 hr tablet Take 1 tablet (60 mg total) by mouth daily.  90 tablet  1  . metolazone (ZAROXOLYN) 2.5 MG tablet Take 1 tablet (2.5 mg total) by mouth as directed.  5 tablet  3  . milrinone (PRIMACOR) 20 MG/100ML SOLN infusion Inject 20.45 mcg/min into the vein continuous. 0.25 mcg/kg/min  100 mL  0  . Multiple Vitamin (MULTIVITAMIN) tablet Take 0.5 tablets by mouth daily.       . nitroGLYCERIN (NITROSTAT) 0.4 MG SL tablet Place 0.4 mg under the tongue every 5 (five) minutes as needed for chest pain.       . Omega-3 Fatty Acids (FISH OIL) 1000 MG CAPS Take 1 capsule by mouth daily.      . potassium chloride SA (K-DUR,KLOR-CON) 20 MEQ tablet Take 2 tablets (40 mEq total) by mouth 2 (two) times daily.  270 tablet  1  . spironolactone (ALDACTONE) 25 MG tablet Take 0.5 tablets (12.5 mg total) by mouth daily.  45 tablet  6  . warfarin (COUMADIN) 2.5 MG tablet Take 1 tablet per day on Sun Tue Wed Thu Sat and 2 tabs per day on Mon & Fri.  60 tablet  0   No current facility-administered medications for this encounter.    Filed Vitals:   06/07/14 1548  BP: 116/62  Pulse: 73  Weight: 194 lb 12.8 oz (88.361 kg)  SpO2: 99%    PHYSICAL EXAM:  General: Chronically ill appearing. No resp difficulty, sitting in wheelchair. Husband present HEENT: normal Neck: supple. JVP 12, Carotids 2+ bilaterally; no bruits. No lymphadenopathy or thryomegaly appreciated. Cor: PMI normal. Regular rate & rhythm. 2/6 SEM RUSB Lungs: diminished in the bases Abdomen: soft, nontender, nondistended. No hepatosplenomegaly. No bruits or masses. Good bowel sounds. Extremities: no  cyanosis, clubbing, rash.  1+ ankle edema, L>R Neuro: alert & orientedx3, cranial nerves grossly intact. Moves all 4 extremities w/o difficulty. Affect pleasant.   ASSESSMENT & PLAN:  1) Chronic systolic HF: Nonischemic CMP.  EF 20-25% (04/2014).  [name] has BiV-ICD  and is s/p AV nodal ablation to promote BiV pacing.  On exam today, [name] is volume overloaded and weight is up.  NYHA class III symptoms.  - Continue home milrinone 0.25 with weekly labs.  - Restart Lasix to 80 mg daily.  Continue KCl to 40 bid.  - Stop spironolactone for now. - [name] is not on BB with recent cardiogenic shock. - No ACE-I with CKD. - Continue hydralazine 25 mg TID and Imdur 60 mg daily for afterload reduction. - Reinforced the need and importance of daily weights, a low sodium diet, and fluid restriction (less than 2 L a day). Instructed to call the HF clinic if weight increases more than 3 lbs overnight or 5 lbs in a week.  2) Afib: Persistent, s/p AV node ablation 2010 and repeat 2015.  Plan had been for attempted DCCV on amiodarone to get her back into NSR to see if this helps her cardiac output.  [name] needs weekly therapeutic INRs for 1 month, then will plan DCCV.  [name] has had 2 weeks of therapeutic INRs, will see her back in 2 wks and arrange DCCV if INRs remain therapeutic.  -TSH ok, very mild AST elevation. Will need regular eye exams if continues long-term amiodarone.   3) CKD stage IV: Repeat BMET at followup in 2 wks.   Follow up in 2 weeks   CLEGG,AMY NP C 06/07/2014 3:57 PM  Patient seen with NP, agree with the above note.  [name] is volume overloaded.  Lasix was held with increased creatinine, but I think that we need to resume Lasix 80 mg daily but stop spironolactone for now.  Will see her back in 2 wks with BMET.  If INRs remain therapeutic, at that time we can schedule DCCV.   Marca AnconaDalton Mekaela Azizi 06/07/2014

## 2014-06-07 NOTE — Patient Instructions (Signed)
Follow up in 2 weeks  Take lasix 80 mg daily   Stop spironolactone  Do the following things EVERYDAY: 1) Weigh yourself in the morning before breakfast. Write it down and keep it in a log. 2) Take your medicines as prescribed 3) Eat low salt foods-Limit salt (sodium) to 2000 mg per day.  4) Stay as active as you can everyday 5) Limit all fluids for the day to less than 2 liters

## 2014-06-14 ENCOUNTER — Emergency Department (HOSPITAL_COMMUNITY): Payer: Medicare HMO

## 2014-06-14 ENCOUNTER — Encounter (HOSPITAL_COMMUNITY): Payer: Self-pay | Admitting: Emergency Medicine

## 2014-06-14 ENCOUNTER — Inpatient Hospital Stay (HOSPITAL_COMMUNITY)
Admission: EM | Admit: 2014-06-14 | Discharge: 2014-06-22 | DRG: 291 | Disposition: A | Discharge status: Home / Self Care | Payer: Medicare HMO | Attending: Internal Medicine | Admitting: Internal Medicine

## 2014-06-14 ENCOUNTER — Telehealth (HOSPITAL_COMMUNITY): Payer: Self-pay | Admitting: *Deleted

## 2014-06-14 DIAGNOSIS — I5023 Acute on chronic systolic (congestive) heart failure: Principal | ICD-10-CM | POA: Diagnosis present

## 2014-06-14 DIAGNOSIS — Z833 Family history of diabetes mellitus: Secondary | ICD-10-CM

## 2014-06-14 DIAGNOSIS — Z95810 Presence of automatic (implantable) cardiac defibrillator: Secondary | ICD-10-CM | POA: Diagnosis not present

## 2014-06-14 DIAGNOSIS — N179 Acute kidney failure, unspecified: Secondary | ICD-10-CM

## 2014-06-14 DIAGNOSIS — I5022 Chronic systolic (congestive) heart failure: Secondary | ICD-10-CM

## 2014-06-14 DIAGNOSIS — Z8673 Personal history of transient ischemic attack (TIA), and cerebral infarction without residual deficits: Secondary | ICD-10-CM

## 2014-06-14 DIAGNOSIS — M109 Gout, unspecified: Secondary | ICD-10-CM | POA: Diagnosis present

## 2014-06-14 DIAGNOSIS — I482 Chronic atrial fibrillation, unspecified: Secondary | ICD-10-CM | POA: Diagnosis present

## 2014-06-14 DIAGNOSIS — E785 Hyperlipidemia, unspecified: Secondary | ICD-10-CM | POA: Diagnosis present

## 2014-06-14 DIAGNOSIS — I2510 Atherosclerotic heart disease of native coronary artery without angina pectoris: Secondary | ICD-10-CM | POA: Diagnosis present

## 2014-06-14 DIAGNOSIS — I481 Persistent atrial fibrillation: Secondary | ICD-10-CM | POA: Diagnosis present

## 2014-06-14 DIAGNOSIS — M1611 Unilateral primary osteoarthritis, right hip: Secondary | ICD-10-CM | POA: Diagnosis present

## 2014-06-14 DIAGNOSIS — I129 Hypertensive chronic kidney disease with stage 1 through stage 4 chronic kidney disease, or unspecified chronic kidney disease: Secondary | ICD-10-CM | POA: Diagnosis present

## 2014-06-14 DIAGNOSIS — I10 Essential (primary) hypertension: Secondary | ICD-10-CM

## 2014-06-14 DIAGNOSIS — J9621 Acute and chronic respiratory failure with hypoxia: Secondary | ICD-10-CM

## 2014-06-14 DIAGNOSIS — E119 Type 2 diabetes mellitus without complications: Secondary | ICD-10-CM | POA: Diagnosis present

## 2014-06-14 DIAGNOSIS — M170 Bilateral primary osteoarthritis of knee: Secondary | ICD-10-CM | POA: Diagnosis present

## 2014-06-14 DIAGNOSIS — E876 Hypokalemia: Secondary | ICD-10-CM

## 2014-06-14 DIAGNOSIS — R0602 Shortness of breath: Secondary | ICD-10-CM | POA: Diagnosis present

## 2014-06-14 DIAGNOSIS — Z8249 Family history of ischemic heart disease and other diseases of the circulatory system: Secondary | ICD-10-CM | POA: Diagnosis not present

## 2014-06-14 DIAGNOSIS — N189 Chronic kidney disease, unspecified: Secondary | ICD-10-CM

## 2014-06-14 DIAGNOSIS — N184 Chronic kidney disease, stage 4 (severe): Secondary | ICD-10-CM | POA: Diagnosis present

## 2014-06-14 DIAGNOSIS — Z7901 Long term (current) use of anticoagulants: Secondary | ICD-10-CM

## 2014-06-14 DIAGNOSIS — Z887 Allergy status to serum and vaccine status: Secondary | ICD-10-CM

## 2014-06-14 DIAGNOSIS — I429 Cardiomyopathy, unspecified: Secondary | ICD-10-CM | POA: Diagnosis present

## 2014-06-14 DIAGNOSIS — J9620 Acute and chronic respiratory failure, unspecified whether with hypoxia or hypercapnia: Secondary | ICD-10-CM

## 2014-06-14 DIAGNOSIS — I517 Cardiomegaly: Secondary | ICD-10-CM | POA: Diagnosis present

## 2014-06-14 DIAGNOSIS — J9601 Acute respiratory failure with hypoxia: Secondary | ICD-10-CM | POA: Diagnosis present

## 2014-06-14 DIAGNOSIS — I509 Heart failure, unspecified: Secondary | ICD-10-CM

## 2014-06-14 LAB — PROTIME-INR
INR: 2.96 — AB (ref 0.00–1.49)
INR: 2.96 — ABNORMAL HIGH (ref 0.00–1.49)
PROTHROMBIN TIME: 31 s — AB (ref 11.6–15.2)
PROTHROMBIN TIME: 31 s — AB (ref 11.6–15.2)

## 2014-06-14 LAB — CBC
HCT: 29.8 % — ABNORMAL LOW (ref 36.0–46.0)
HEMOGLOBIN: 10.2 g/dL — AB (ref 12.0–15.0)
MCH: 29.9 pg (ref 26.0–34.0)
MCHC: 34.2 g/dL (ref 30.0–36.0)
MCV: 87.4 fL (ref 78.0–100.0)
PLATELETS: 227 10*3/uL (ref 150–400)
RBC: 3.41 MIL/uL — AB (ref 3.87–5.11)
RDW: 18.1 % — ABNORMAL HIGH (ref 11.5–15.5)
WBC: 4.7 10*3/uL (ref 4.0–10.5)

## 2014-06-14 LAB — TSH: TSH: 2.33 u[IU]/mL (ref 0.350–4.500)

## 2014-06-14 LAB — BASIC METABOLIC PANEL
ANION GAP: 16 — AB (ref 5–15)
BUN: 42 mg/dL — ABNORMAL HIGH (ref 6–23)
CALCIUM: 9.2 mg/dL (ref 8.4–10.5)
CHLORIDE: 108 meq/L (ref 96–112)
CO2: 17 meq/L — AB (ref 19–32)
Creatinine, Ser: 2.99 mg/dL — ABNORMAL HIGH (ref 0.50–1.10)
GFR calc Af Amer: 16 mL/min — ABNORMAL LOW (ref >90)
GFR calc non Af Amer: 14 mL/min — ABNORMAL LOW (ref >90)
GLUCOSE: 112 mg/dL — AB (ref 70–99)
Potassium: 5 mEq/L (ref 3.7–5.3)
Sodium: 141 mEq/L (ref 137–147)

## 2014-06-14 LAB — I-STAT TROPONIN, ED: Troponin i, poc: 0.06 ng/mL (ref 0.00–0.08)

## 2014-06-14 LAB — PRO B NATRIURETIC PEPTIDE: PRO B NATRI PEPTIDE: 7005 pg/mL — AB (ref 0–450)

## 2014-06-14 LAB — TROPONIN I

## 2014-06-14 MED ORDER — SODIUM CHLORIDE 0.9 % IJ SOLN
3.0000 mL | Freq: Two times a day (BID) | INTRAMUSCULAR | Status: DC
  Administered 2014-06-16 – 2014-06-21 (×6): 3 mL via INTRAVENOUS

## 2014-06-14 MED ORDER — FUROSEMIDE 10 MG/ML IJ SOLN
80.0000 mg | Freq: Once | INTRAMUSCULAR | Status: AC
  Administered 2014-06-14: 80 mg via INTRAVENOUS
  Filled 2014-06-14: qty 8

## 2014-06-14 MED ORDER — FUROSEMIDE 10 MG/ML IJ SOLN: 80.0000 mg | Freq: Two times a day (BID) | INTRAMUSCULAR | Status: DC

## 2014-06-14 MED ORDER — MILRINONE IN DEXTROSE 20 MG/100ML IV SOLN: 0.3750 ug/kg/min | INTRAVENOUS | Status: DC

## 2014-06-14 MED ORDER — SODIUM CHLORIDE 0.9 % IV SOLN
250.0000 mL | INTRAVENOUS | Status: DC | PRN
  Administered 2014-06-16 – 2014-06-18 (×3): 250 mL via INTRAVENOUS
  Administered 2014-06-19: 10:00:00 via INTRAVENOUS

## 2014-06-14 MED ORDER — MILRINONE IN DEXTROSE 20 MG/100ML IV SOLN
0.2500 ug/kg/min | INTRAVENOUS | Status: DC
  Administered 2014-06-15 – 2014-06-22 (×14): 0.25 ug/kg/min via INTRAVENOUS
  Filled 2014-06-14 (×14): qty 100

## 2014-06-14 MED ORDER — SODIUM CHLORIDE 0.9 % IJ SOLN: 3.0000 mL | INTRAMUSCULAR | Status: DC | PRN

## 2014-06-14 MED ORDER — FUROSEMIDE 10 MG/ML IJ SOLN: 80.0000 mg | Freq: Once | INTRAMUSCULAR | Status: DC

## 2014-06-14 NOTE — ED Provider Notes (Signed)
CSN: [id number]     Arrival date & time 06/14/14  1706 History   First MD Initiated Contact with Patient 06/14/14 1730     Chief Complaint  Patient presents with  . Shortness of Breath     (Consider location/radiation/quality/duration/timing/severity/associated sxs/prior Treatment) HPI [name] This is a 80 year old female sent in by her cardiologist for shortness of breath and weight gain. She has a past medical history of chronic systolic heart failure. She has a history of CKD. She is on a chronic milrinone drip. She states that she has had weight gain and worsening shortness of breath at rest which has been acutely bad over the past 10 days. Review of her medical chart shows nursing records from today which show a 6 pound weight gain in the past week and a 12 pound weight gain over the past month. The patient was seen today by her home health care nurse who also noted hypoxia with ambulation. Patient states that she has severe swelling in her bilateral lower extremities which is somewhat improved from yesterday. She states she has been taking her medications as directed. The patient records also state that she is supposed to be taking metolazone. Her husband states that they have their medications and have not yet received it. Patient endorses dry cough, orthopnea and PND. Denies fevers, chills, myalgias, arthralgias. She denies chest pain, diaphoresis.  Denies dysuria, flank pain, suprapubic pain, frequency, urgency, or hematuria. Denies headaches, light headedness, weakness, visual disturbances. Denies abdominal pain, nausea, vomiting, diarrhea or constipation.    Past Medical History  Diagnosis Date  . Nonischemic cardiomyopathy     a) Initial dx 1992 b) nl coronary angios in 1/03 c) AICD/bivent. PM - 4/09;    . Hypertension   . CKD (chronic kidney disease), stage IV   . PSVT (paroxysmal supraventricular tachycardia)     s/p radiofrequency ablation  . Gout     uric acid  of 10 6/11  . Anemia   . Thrombus     left upper extremity; following device insertion  . Chronic anticoagulation   . Coronary artery disease   . Chronic systolic heart failure     a) NICM b) EF 40-45% (01/2009) c) EF 20-25% (04/2014) d) RHC (04/2014): RA 9, RV 34/2/6, PA 34/16 (24), PCWP: 12, Fick CO/CI: 3.6 /1.8, PA sat 50% and 52%  . Automatic implantable cardioverter-defibrillator in situ   . Hepatitis 1962    "don't know which kind"  . CVA (cerebral vascular accident) 1992    "memory not always clear since"  . DJD (degenerative joint disease)     knees; right hip  . Arthritis     "knees, back" (04/03/2014)  . Chronic mid back pain   . AV node dysfunction     a) AV node ablation in 2010 and repeat 2015   Past Surgical History  Procedure Laterality Date  . Abdominal hysterectomy    . Bi-ventricular implantable cardioverter defibrillator  (crt-d)  4/09; 12/13/2013    MDT CRTD implanted by Dr Ladona Ridgelaylor; gen change to MDT CRTD 4-15 by Dr Ladona Ridgelaylor  . Cataract extraction w/ intraocular lens implant Right ~ 2009   Family History  Problem Relation Age of Onset  . Diabetes Brother   . Heart attack Sister [id number]    also has liver problems   . Heart attack Mother     deceased   History  Substance Use Topics  . Smoking status: Never Smoker   . Smokeless tobacco: Never  Used  . Alcohol Use: No   OB History   Grav Para Term Preterm Abortions TAB SAB Ect Mult Living                 Review of Systems   Ten systems reviewed and are negative for acute change, except as noted in the HPI.   Allergies  Tuberculin tests  Home Medications   Prior to Admission medications   Medication Sig Start Date End Date Taking? Authorizing Provider  acetaminophen (TYLENOL) 325 MG tablet Take 2 tablets (650 mg total) by mouth every 4 (four) hours as needed for mild pain, moderate pain, fever or headache. 04/21/14   Elease Etienne, MD  allopurinol (ZYLOPRIM) 100 MG tablet Take 100 mg by mouth daily.     Historical Provider, MD  amiodarone (PACERONE) 200 MG tablet Take 1 tablet (200 mg total) by mouth 2 (two) times daily. 05/09/14   Laurey Morale, MD  atorvastatin (LIPITOR) 20 MG tablet Take 1 tablet (20 mg total) by mouth daily. 04/26/14   Aundria Rud, NP  bisacodyl (DULCOLAX) 10 MG suppository Place 1 suppository (10 mg total) rectally daily as needed for moderate constipation. 04/17/14   Osvaldo Shipper, MD  colchicine 0.6 MG tablet Take 1 tablet (0.6 mg total) by mouth daily. 05/22/14   Laurey Morale, MD  Docusate Sodium (DSS) 100 MG CAPS Take 100 mg by mouth daily as needed (mild constipation). 04/21/14   Elease Etienne, MD  ferrous sulfate 325 (65 FE) MG tablet Take 1 tablet (325 mg total) by mouth 2 (two) times daily with a meal. 04/21/14   Elease Etienne, MD  furosemide (LASIX) 40 MG tablet Take 2 tablets (80 mg total) by mouth daily. 05/22/14   Laurey Morale, MD  hydrALAZINE (APRESOLINE) 25 MG tablet Take 1 tablet (25 mg total) by mouth 3 (three) times daily. 05/09/14   Laurey Morale, MD  isosorbide mononitrate (IMDUR) 60 MG 24 hr tablet Take 1 tablet (60 mg total) by mouth daily. 05/09/14   Laurey Morale, MD  metolazone (ZAROXOLYN) 2.5 MG tablet Take 1 tablet (2.5 mg total) by mouth as directed. 05/09/14   Laurey Morale, MD  milrinone [facility]) 20 MG/100ML SOLN infusion Inject 20.45 mcg/min into the vein continuous. 0.25 mcg/kg/min 04/21/14   Elease Etienne, MD  Multiple Vitamin (MULTIVITAMIN) tablet Take 0.5 tablets by mouth daily.     Historical Provider, MD  nitroGLYCERIN (NITROSTAT) 0.4 MG SL tablet Place 0.4 mg under the tongue every 5 (five) minutes as needed for chest pain.     Historical Provider, MD  Omega-3 Fatty Acids (FISH OIL) 1000 MG CAPS Take 1 capsule by mouth daily.    Historical Provider, MD  potassium chloride SA (K-DUR,KLOR-CON) 20 MEQ tablet Take 2 tablets (40 mEq total) by mouth 2 (two) times daily. 05/22/14   Laurey Morale, MD  spironolactone (ALDACTONE) 25 MG  tablet Take 0.5 tablets (12.5 mg total) by mouth daily. 05/22/14   Laurey Morale, MD  warfarin (COUMADIN) 2.5 MG tablet Take 1 tablet per day on Sun Tue Wed Thu Sat and 2 tabs per day on Mon & Fri. 04/21/14   Elease Etienne, MD   BP 112/56  Pulse 82  Temp(Src) 97.9 F (36.6 C)  Resp 24  Wt 201 lb 3 oz (91.258 kg)  SpO2 98% Physical Exam  Vitals reviewed. Constitutional: She is oriented to person, place, and time. She appears well-developed and well-nourished.  No distress.  HENT:  Head: Normocephalic and atraumatic.  Eyes: Conjunctivae are normal. No scleral icterus.  Neck: Normal range of motion.  Cardiovascular: Normal rate, regular rhythm and normal heart sounds.  Exam reveals no gallop and no friction rub.   No murmur heard. 4+ pitting edema in the bilateral lower extremities past the knees.  Pulmonary/Chest:  Tachypneic.  Decreased air mvmt. No overt crackles.   Abdominal: Soft. Bowel sounds are normal. She exhibits no distension and no mass. There is no tenderness. There is no guarding.  Neurological: She is alert and oriented to person, place, and time.  Skin: Skin is warm and dry. She is not diaphoretic.    ED Course  Procedures (including critical care time) Labs Review Labs Reviewed  CBC  BASIC METABOLIC PANEL  PRO B NATRIURETIC PEPTIDE  PROTIME-INR  I-STAT TROPOININ, ED    Imaging Review No results found.   EKG Interpretation   Date/Time:  Wednesday June 14 2014 17:12:02 EDT Ventricular Rate:  73 PR Interval:    QRS Duration: 192 QT Interval:  530 QTC Calculation: 583 R Axis:   145 Text Interpretation:  Ventricular-paced rhythm Abnormal ECG Confirmed by  Bebe Shaggy  MD, Dorinda Hill ([id number]) on 06/14/2014 5:16:32 PM      MDM   Final diagnoses:  Shortness of breath  Acute on chronic systolic HF (heart failure)  Atrial fibrillation, chronic  Biventricular implantable cardioverter-defibrillator in situ  Acute on chronic renal failure  Acute on  chronic respiratory failure with hypoxia  Acute on chronic systolic heart failure  Essential hypertension  Hypokalemia  Chronic systolic heart failure   BP 113/65  Pulse 82  Temp(Src) 97.9 F (36.6 C)  Resp 22  Wt 201 lb 3 oz (91.258 kg)  SpO2 97%   patient with chronic systolic heart failure on milrinone drip. She's had a 12 pound weight gain according to notes in the past month. She appears to be in acute CHF exacerbation. Labs are currently pending. I have ordered 80 mg IV Lasix  Patient picked up for admission by the cardiology team.   Arthor Captain, PA-C 06/19/14 2135

## 2014-06-14 NOTE — Telephone Encounter (Signed)
[name] called back and states pt wishes to be admitted tonight, her husband will bring her to [facility] ER, Dr Gala Romney is aware

## 2014-06-14 NOTE — ED Notes (Signed)
Per pt sts SOB x 1 week and worsening over the past day. sts extremity swelling. denies chest pain. Sent here by doctor.

## 2014-06-14 NOTE — H&P (Signed)
Pt. Seen and examined. Agree with the NP/PA-C note as written. 80 yo female followed in the advanced CHF clinic with NICM EF 15-20%, Stage IV CKD on milrinone, permanent a-fib (with 2 prior AVN ablations), who was seen in the CHF clinic last week. She has had progressive dyspnea and was off of diuretics for a little while and restarted on lasix 80 mg daily. She recently has had about 10 lb weight gain and worsening renal failure. Metolazone was recommended, however, she felt that she needed to come to the hospital. Agree with exam, consistent with decompensated systolic CHF.   Recommend admission for IV diuresis. Will give 80 IV BID. Increase milrinone to 0.375 mcg. Will ask the CHF service to see her in the morning.  Chrystie Nose, MD, [facility] Attending Cardiologist [facility]

## 2014-06-14 NOTE — ED Notes (Addendum)
Milrinone continuous pump rate @ 6.48ml/hr.

## 2014-06-14 NOTE — ED Notes (Signed)
Spoke with IV team RN about patient PICC line. Per previous shift RN pharmacy would not sign off on use of 2nd lumen of PICC line with milrinone drip running as medications may be incompatible and type of PICC line is not documented. IV Team RN to check into type of PICC line.

## 2014-06-14 NOTE — ED Notes (Signed)
Attempted report 

## 2014-06-14 NOTE — Progress Notes (Signed)
Unit CM UR Completed by [facility] ED CM  W. Falcon Mccaskey RN  

## 2014-06-14 NOTE — ED Notes (Signed)
Per IV RN, compatibles can be run into PICC line. Checked and assessed by IV team.

## 2014-06-14 NOTE — H&P (Signed)
Patient ID: [name] MRN: [id number], DOB/AGE: 1932/12/25   Admit date: 06/14/2014   Primary Physician: Alice Reichert, MD Primary Cardiologist: Dr. Ladona Ridgel and Dr. Shirlee Latch in CHF clinic  Pt. Profile:  [name] is a 80 y.o. female with a history of NICM s/p BiV ICD, HTN, chronic systolic HF, permanent afib (s/p AVN ablation 2010 and again repeat 2015), CVA, DM2 and CKD stage IV who presents to [facility] today with SOB.   ECHO (04/2014): EF 15-20%, grade III DD, severe MR, biatrial enlargement, mod TR. Admitted 04/2014 with increased SOB and fatigue. ICD interrogated and showed 1/3 of ventricular beats actually conducted through AV node limiting BiV pacing and underwent repeat AV node ablation. Had RHC showing low output and started on milrinone. Failed milrinone wean. Loaded with amiodarone for potential DCCV.  She was seen on 06/07/14 in CHF clinic by Dr. Shirlee Latch and continued on Milrinone at 0.25 mcg.  Her weight was 194lbs and she was felt volume overloaded at that time. Lasix, which was previous held due to AKI was then increased lasix to 80 mg daily and spironolactone 12.5 mg was discontinued.   She has been taking all her medications as prescribed and denies salt or fluid indiscretion; however, over the past week she has become more SOB. It became acutely worse yesterday and continued today. Her home health RN was visiting today and found her to be SOB and hypoxic with ambulation and called Dr. Gala Romney who advised her to start Metolazone and come to the hospital for admission if this did not help; however, she decided she would rather come in and be admitted. She also has some mild orthopnea and LE edema. No PND. She has a chronic hacking cough. Not using NSAIDs. She denies CP, palpitations, lightheadedness or dizziness. No recent fevers or chills.     Problem List  Past Medical History  Diagnosis Date  . Nonischemic cardiomyopathy     a) Initial dx 1992 b) nl  coronary angios in 1/03 c) AICD/bivent. PM - 4/09;    . Hypertension   . CKD (chronic kidney disease), stage IV   . PSVT (paroxysmal supraventricular tachycardia)     s/p radiofrequency ablation  . Gout     uric acid of 10 6/11  . Anemia   . Thrombus     left upper extremity; following device insertion  . Chronic anticoagulation   . Coronary artery disease   . Chronic systolic heart failure     a) NICM b) EF 40-45% (01/2009) c) EF 20-25% (04/2014) d) RHC (04/2014): RA 9, RV 34/2/6, PA 34/16 (24), PCWP: 12, Fick CO/CI: 3.6 /1.8, PA sat 50% and 52%  . Automatic implantable cardioverter-defibrillator in situ   . Hepatitis 1962    "don't know which kind"  . CVA (cerebral vascular accident) 1992    "memory not always clear since"  . DJD (degenerative joint disease)     knees; right hip  . Arthritis     "knees, back" (04/03/2014)  . Chronic mid back pain   . AV node dysfunction     a) AV node ablation in 2010 and repeat 2015    Past Surgical History  Procedure Laterality Date  . Abdominal hysterectomy    . Bi-ventricular implantable cardioverter defibrillator  (crt-d)  4/09; 12/13/2013    MDT CRTD implanted by Dr Ladona Ridgel; gen change to MDT CRTD 4-15 by Dr Ladona Ridgel  . Cataract extraction w/ intraocular lens implant Right ~ 2009  Allergies  Allergies  Allergen Reactions  . Tuberculin Tests Swelling     Home Medications  Prior to Admission medications   Medication Sig Start Date End Date Taking? Authorizing Provider  allopurinol (ZYLOPRIM) 100 MG tablet Take 100 mg by mouth daily.   Yes Historical Provider, MD  amiodarone (PACERONE) 200 MG tablet Take 1 tablet (200 mg total) by mouth 2 (two) times daily. 05/09/14  Yes Laurey Morale, MD  atorvastatin (LIPITOR) 20 MG tablet Take 1 tablet (20 mg total) by mouth daily. 04/26/14  Yes Aundria Rud, NP  colchicine 0.6 MG tablet Take 1 tablet (0.6 mg total) by mouth daily. 05/22/14  Yes Laurey Morale, MD  ferrous sulfate 325 (65 FE) MG  tablet Take 1 tablet (325 mg total) by mouth 2 (two) times daily with a meal. 04/21/14  Yes Elease Etienne, MD  furosemide (LASIX) 40 MG tablet Take 2 tablets (80 mg total) by mouth daily. 05/22/14  Yes Laurey Morale, MD  hydrALAZINE (APRESOLINE) 25 MG tablet Take 1 tablet (25 mg total) by mouth 3 (three) times daily. 05/09/14  Yes Laurey Morale, MD  isosorbide mononitrate (IMDUR) 60 MG 24 hr tablet Take 1 tablet (60 mg total) by mouth daily. 05/09/14  Yes Laurey Morale, MD  milrinone [facility]) 20 MG/100ML SOLN infusion Inject 20.45 mcg/min into the vein continuous. 0.25 mcg/kg/min 04/21/14  Yes Elease Etienne, MD  Multiple Vitamin (MULTIVITAMIN) tablet Take 0.5 tablets by mouth daily.    Yes Historical Provider, MD  Omega-3 Fatty Acids (FISH OIL) 1000 MG CAPS Take 1 capsule by mouth daily.   Yes Historical Provider, MD  potassium chloride SA (K-DUR,KLOR-CON) 20 MEQ tablet Take 2 tablets (40 mEq total) by mouth 2 (two) times daily. 05/22/14  Yes Laurey Morale, MD  warfarin (COUMADIN) 2.5 MG tablet Take 1 tablet per day on Sun Tue Wed Thu Sat and 2 tabs per day on Mon & Fri. 04/21/14  Yes Elease Etienne, MD  nitroGLYCERIN (NITROSTAT) 0.4 MG SL tablet Place 0.4 mg under the tongue every 5 (five) minutes as needed for chest pain.     Historical Provider, MD    Family History  Family History  Problem Relation Age of Onset  . Diabetes Brother   . Heart attack Sister 66    also has liver problems   . Heart attack Mother     deceased   Family Status  Relation Status Death Age  . Father Deceased   . Mother Deceased     at an advanced age; hx of cardiomegaly     Social History  History   Social History  . Marital Status: Married    Spouse Name: N/A    Number of Children: N/A  . Years of Education: N/A   Occupational History  . Not on file.   Social History Main Topics  . Smoking status: Never Smoker   . Smokeless tobacco: Never Used  . Alcohol Use: No  . Drug Use: No  .  Sexual Activity: No   Other Topics Concern  . Not on file   Social History Narrative   Married, retired, does not get regular exercise, no caffeine.       All other systems reviewed and are otherwise negative except as noted above.  Physical Exam  Blood pressure 125/66, pulse 73, temperature 97.9 F (36.6 C), resp. rate 27, weight 201 lb 3 oz (91.258 kg), SpO2 99.00%.   General: Chronically ill  appearing. No resp difficulty, sitting in wheelchair. Husband present  HEENT: normal  Neck: supple. JVP 12, Carotids 2+ bilaterally; no bruits. No lymphadenopathy or thryomegaly appreciated.  Cor: PMI normal. Regular rate & rhythm. 2/6 SEM RUSB  Lungs: diminished in the bases  Abdomen: soft, nontender, nondistended. No hepatosplenomegaly. No bruits or masses. Good bowel sounds.  Extremities: no cyanosis, clubbing, rash. 2+ ankle edema, L>R  Neuro: alert & orientedx3, cranial nerves grossly intact. Moves all 4 extremities w/o difficulty. Affect pleasant.   Labs  No results found for this basename: CKTOTAL, CKMB, TROPONINI,  in the last 72 hours Lab Results  Component Value Date   WBC 6.0 04/17/2014   HGB 10.5* 04/17/2014   HCT 31.6* 04/17/2014   MCV 86.8 04/17/2014   PLT 248 04/17/2014      Radiology/Studies  No results found.  ECG  V paced HR 73  ASSESSMENT AND PLAN  [name] is a 80 y.o. female with a history of NICM s/p BiV ICD, HTN, chronic systolic HF, permanent afib (s/p AVN ablation 2010 and again repeat 2015), CVA, DM2 and CKD stage IV who [facility]B.   Chronic systolic HF: Nonischemic CMP. EF 20-25% (04/2014). She has BiV-ICD and is s/p AV nodal ablation to promote BiV pacing. On home milrinone 0.25 and followed in the CHF clinic. CXR with cardiomegaly. No active disease. BNP 7K. -- Weight was 194lbs on 06/07/14 and she was felt volume overloaded at that time. Today 201 lbs.  -- Was on Lasix to 80 mg daily.  -- She is not on BB with recent  cardiogenic shock.  -- No ACE-I with CKD.  -- Continue hydralazine 25 mg TID and Imdur 60 mg daily for afterload reduction.  -- Will start IV Lasix 80 mg BID and increase milrinone 0.375 mcg/min   Afib: Persistent, s/p AV node ablation 2010 and repeat 2015. Plan had been for attempted DCCV on amiodarone to get her back into NSR to see if this helps her cardiac output. INRs remain therapeutic planned for possible DVVC next week.  -- Continue amiodarone 200mg  BID -- May need to cardiovert sooner rather than later. Consider TEE/DCCV on this admission as this may improve her CHF symptoms -- INR 2.96. Will have pharmacy help with dosing    CKD stage IV: Creat elevated to 2.99 from 2.21 on 04/21/14  -- Continue to monitor in the setting of diuresis  HTN- well controlled on currently medications  Signed, Janetta HoraHOMPSON, Presly Steinruck R, PA-C 06/14/2014, 7:00 PM  Pager [phone]

## 2014-06-14 NOTE — ED Notes (Signed)
Spoke with IV team regarding PICC line status. IV team to see patient soon.

## 2014-06-14 NOTE — Telephone Encounter (Signed)
Angie called concerned about pt, she states wt today is 199.6, last week was 194, 10/2 189, 9/25 183, she states pt is SOB with minimal exertion, lungs CTA, 4+ LE edema, o2 sat 98% at rest but dropped to 88% with taking a few steps, she states pt has been on lasix 80 mg daily, discussed with Dr Gala Romney he would like for pt to take 5 mg Metolazone now and have RN see pt in AM if not improving will need to be admitted to [facility], Angie is aware and will notify pt she states they will see pt in AM for f/u

## 2014-06-14 NOTE — ED Notes (Signed)
Patient transported to X-ray 

## 2014-06-15 DIAGNOSIS — J9620 Acute and chronic respiratory failure, unspecified whether with hypoxia or hypercapnia: Secondary | ICD-10-CM

## 2014-06-15 DIAGNOSIS — N189 Chronic kidney disease, unspecified: Secondary | ICD-10-CM

## 2014-06-15 DIAGNOSIS — N179 Acute kidney failure, unspecified: Secondary | ICD-10-CM

## 2014-06-15 LAB — CARBOXYHEMOGLOBIN
CARBOXYHEMOGLOBIN: 1.2 % (ref 0.5–1.5)
Methemoglobin: 0.6 % (ref 0.0–1.5)
O2 SAT: 65.5 %
TOTAL HEMOGLOBIN: 10.2 g/dL — AB (ref 12.0–16.0)

## 2014-06-15 LAB — BASIC METABOLIC PANEL
Anion gap: 14 (ref 5–15)
BUN: 43 mg/dL — AB (ref 6–23)
CO2: 19 mEq/L (ref 19–32)
CREATININE: 2.95 mg/dL — AB (ref 0.50–1.10)
Calcium: 9 mg/dL (ref 8.4–10.5)
Chloride: 108 mEq/L (ref 96–112)
GFR calc Af Amer: 16 mL/min — ABNORMAL LOW (ref >90)
GFR, EST NON AFRICAN AMERICAN: 14 mL/min — AB (ref >90)
GLUCOSE: 89 mg/dL (ref 70–99)
POTASSIUM: 3.9 meq/L (ref 3.7–5.3)
Sodium: 141 mEq/L (ref 137–147)

## 2014-06-15 LAB — PROTIME-INR
INR: 2.88 — ABNORMAL HIGH (ref 0.00–1.49)
Prothrombin Time: 30.4 seconds — ABNORMAL HIGH (ref 11.6–15.2)

## 2014-06-15 LAB — TROPONIN I
Troponin I: <0.3 ng/mL (ref <0.30)
Troponin I: <0.3 ng/mL (ref <0.30)

## 2014-06-15 MED ORDER — ATORVASTATIN CALCIUM 20 MG PO TABS
20.0000 mg | ORAL_TABLET | Freq: Every day | ORAL | Status: DC
  Administered 2014-06-15 – 2014-06-21 (×7): 20 mg via ORAL
  Filled 2014-06-15 (×8): qty 1

## 2014-06-15 MED ORDER — SODIUM CHLORIDE 0.9 % IJ SOLN
10.0000 mL | INTRAMUSCULAR | Status: DC | PRN
  Administered 2014-06-15 – 2014-06-22 (×10): 10 mL

## 2014-06-15 MED ORDER — ALLOPURINOL 100 MG PO TABS
100.0000 mg | ORAL_TABLET | Freq: Every day | ORAL | Status: DC
  Administered 2014-06-15 – 2014-06-22 (×8): 100 mg via ORAL
  Filled 2014-06-15 (×8): qty 1

## 2014-06-15 MED ORDER — HYDRALAZINE HCL 25 MG PO TABS
12.5000 mg | ORAL_TABLET | Freq: Three times a day (TID) | ORAL | Status: DC
  Administered 2014-06-15 – 2014-06-16 (×4): 12.5 mg via ORAL
  Filled 2014-06-15 (×6): qty 0.5

## 2014-06-15 MED ORDER — AMIODARONE HCL 200 MG PO TABS
200.0000 mg | ORAL_TABLET | Freq: Two times a day (BID) | ORAL | Status: DC
  Administered 2014-06-15 – 2014-06-22 (×15): 200 mg via ORAL
  Filled 2014-06-15 (×16): qty 1

## 2014-06-15 MED ORDER — ISOSORBIDE MONONITRATE ER 30 MG PO TB24
30.0000 mg | ORAL_TABLET | Freq: Every day | ORAL | Status: DC
  Administered 2014-06-15 – 2014-06-22 (×8): 30 mg via ORAL
  Filled 2014-06-15 (×8): qty 1

## 2014-06-15 MED ORDER — COLCHICINE 0.6 MG PO TABS
0.6000 mg | ORAL_TABLET | Freq: Every day | ORAL | Status: DC
  Administered 2014-06-15 – 2014-06-16 (×2): 0.6 mg via ORAL
  Filled 2014-06-15 (×2): qty 1

## 2014-06-15 MED ORDER — FERROUS SULFATE 325 (65 FE) MG PO TABS
325.0000 mg | ORAL_TABLET | Freq: Every day | ORAL | Status: DC
  Administered 2014-06-16 – 2014-06-22 (×7): 325 mg via ORAL
  Filled 2014-06-15 (×8): qty 1

## 2014-06-15 MED ORDER — METOLAZONE 5 MG PO TABS
5.0000 mg | ORAL_TABLET | Freq: Two times a day (BID) | ORAL | Status: DC
  Administered 2014-06-15 – 2014-06-19 (×8): 5 mg via ORAL
  Filled 2014-06-15 (×10): qty 1

## 2014-06-15 MED ORDER — WARFARIN SODIUM 2.5 MG PO TABS
2.5000 mg | ORAL_TABLET | ORAL | Status: DC
  Administered 2014-06-15 (×2): 2.5 mg via ORAL
  Filled 2014-06-15 (×3): qty 1

## 2014-06-15 MED ORDER — POTASSIUM CHLORIDE CRYS ER 20 MEQ PO TBCR
40.0000 meq | EXTENDED_RELEASE_TABLET | Freq: Two times a day (BID) | ORAL | Status: DC
  Administered 2014-06-15: 40 meq via ORAL
  Filled 2014-06-15: qty 2

## 2014-06-15 MED ORDER — FUROSEMIDE 10 MG/ML IJ SOLN
80.0000 mg | Freq: Two times a day (BID) | INTRAMUSCULAR | Status: DC
  Administered 2014-06-15 (×2): 80 mg via INTRAVENOUS
  Filled 2014-06-15 (×2): qty 8

## 2014-06-15 MED ORDER — FUROSEMIDE 10 MG/ML IJ SOLN
120.0000 mg | Freq: Three times a day (TID) | INTRAVENOUS | Status: DC
  Administered 2014-06-15 – 2014-06-16 (×3): 120 mg via INTRAVENOUS
  Filled 2014-06-15 (×4): qty 12

## 2014-06-15 MED ORDER — WARFARIN SODIUM 5 MG PO TABS
5.0000 mg | ORAL_TABLET | ORAL | Status: DC
  Administered 2014-06-16: 5 mg via ORAL
  Filled 2014-06-15: qty 1

## 2014-06-15 MED ORDER — WARFARIN - PHARMACIST DOSING INPATIENT
Freq: Every day | Status: DC
  Administered 2014-06-20: 2.5
  Administered 2014-06-21: 19:00:00

## 2014-06-15 NOTE — Evaluation (Signed)
Physical Therapy Evaluation Patient Details Name: [name] MRN: [id number] DOB: 1933/08/15 Today's Date: 06/15/2014   History of Present Illness  [name] is a 80 y.o. female with a history of NICM s/p BiV ICD, HTN, chronic systolic HF, permanent afib (s/p AVN ablation 2010 and again repeat 2015), CVA, DM2 and CKD stage IV who presents to [facility] today with SOB.due to CHF  Clinical Impression  Pt admitted with/for CHF.  Pt currently limited functionally due to the problems listed below.  (see problems list.)  Pt will benefit from PT to maximize function and safety to be able to get home safely with available assist of family .     Follow Up Recommendations Home health PT;Supervision for mobility/OOB    Equipment Recommendations  None recommended by PT    Recommendations for Other Services       Precautions / Restrictions Precautions Precautions: Fall      Mobility  Bed Mobility Overal bed mobility: Modified Independent                Transfers Overall transfer level: Needs assistance   Transfers: Sit to/from Stand Sit to Stand: Supervision         General transfer comment: Does a good job leaning forward out past her knees to be able to power up with UE assist from bed or toilet.  Ambulation/Gait Ambulation/Gait assistance: Min guard Ambulation Distance (Feet): 15 Feet (times 2) Assistive device: Rolling walker (2 wheeled) Gait Pattern/deviations: Step-through pattern Gait velocity: slow   General Gait Details: slow steady steps with trunk flexed forward at hips significantly.  Stairs            Wheelchair Mobility    Modified Rankin (Stroke Patients Only)       Balance Overall balance assessment: Needs assistance Sitting-balance support: No upper extremity supported Sitting balance-Leahy Scale: Fair     Standing balance support: Single extremity supported Standing balance-Leahy Scale: Fair Standing balance comment:  statically can stand without assistive device.                             Pertinent Vitals/Pain Pain Assessment: No/denies pain    Home Living Family/patient expects to be discharged to:: Private residence Living Arrangements: Spouse/significant other Available Help at Discharge: Family;Available 24 hours/day Type of Home: House Home Access: Stairs to enter Entrance Stairs-Rails: Right Entrance Stairs-Number of Steps: 3 Home Layout: Laundry or work area in basement;Two level Home Equipment: Walker - 2 wheels;Bedside commode;Cane - single point;Shower seat Additional Comments: She uses RW still since last admission, but would like to get back to use cane.    Prior Function Level of Independence: Independent with assistive device(s)               Hand Dominance   Dominant Hand: Right    Extremity/Trunk Assessment   Upper Extremity Assessment: Overall WFL for tasks assessed           Lower Extremity Assessment: Overall WFL for tasks assessed (not alot of muscle stamina)      Cervical / Trunk Assessment: Other exceptions (painful arthritic back has led to flexed posture with gait )  Communication   Communication: No difficulties  Cognition Arousal/Alertness: Awake/alert Behavior During Therapy: WFL for tasks assessed/performed Overall Cognitive Status: Within Functional Limits for tasks assessed                      General  Comments General comments (skin integrity, edema, etc.): Consistently dyspneic throughout mobility to/from bathroom on RA, but sats were in the mid ot upper 90% range with EHR in to 80/90's bpm    Exercises        Assessment/Plan    PT Assessment Patient needs continued PT services  PT Diagnosis Difficulty walking (decr activity tolerance)   PT Problem List Decreased activity tolerance;Decreased mobility;Cardiopulmonary status limiting activity;Decreased strength;Decreased knowledge of use of DME  PT Treatment  Interventions DME instruction;Gait training;Stair training;Functional mobility training;Therapeutic activities;Patient/family education;Therapeutic exercise   PT Goals (Current goals can be found in the Care Plan section) Acute Rehab PT Goals Patient Stated Goal: to get back to using a cane on a regular basis PT Goal Formulation: With patient Potential to Achieve Goals: Good    Frequency Min 3X/week   Barriers to discharge        Co-evaluation               End of Session   Activity Tolerance: Patient tolerated treatment well Patient left: in bed;with call bell/phone within reach;with family/visitor present Nurse Communication: Mobility status         Time: [id number] PT Time Calculation (min): 19 min   Charges:   PT Evaluation $Initial PT Evaluation Tier I: 1 Procedure PT Treatments $Gait Training: 8-22 mins   PT G Codes:          Tequita Marrs, Eliseo GumKenneth V 06/15/2014, 12:27 PM 06/15/2014  [place] BingKen Jaydi Bray, PT [phone]  (pager)

## 2014-06-15 NOTE — Progress Notes (Addendum)
Advanced Heart Failure Rounding Note   Subjective:    [name] is a 80 y.o. female with a history of NICM s/p BiV ICD, HTN, chronic systolic HF, permanent afib (s/p AVN ablation 2010 and again repeat 2015), CVA, DM2 and CKD stage IV who presents to [facility] today with SOB.   ECHO (04/2014): EF 15-20%, grade III DD, severe MR, biatrial enlargement, mod TR.   Admitted 04/2014 with increased SOB and fatigue. ICD interrogated and showed 1/3 of ventricular beats actually conducted through AV node limiting BiV pacing and underwent repeat AV node ablation. Had RHC showing low output and started on milrinone. Failed milrinone wean. Loaded with amiodarone for potential DCCV. She was seen on 06/07/14 in CHF clinic by Dr. Shirlee LatchMcLean and continued on Milrinone at 0.25 mcg. Her weight was 194lbs and she was felt volume overloaded at that time. Lasix, which was previous held due to AKI was then increased lasix to 80 mg daily and spironolactone discontinued. Patients dry weight 168 lbs.   Presented to ED 10/14 with increased SOB and hypoxia with ambulation. Pro-BNP 7005, creatinine 2.99 and BUN 42. Started lasix 80 mg IV BID. 24 hr I/O -636.     Objective:   Weight Range:  Vital Signs:   Temp:  [97.5 F (36.4 C)-98.1 F (36.7 C)] 98 F (36.7 C) (10/15 0715) Pulse Rate:  [70-82] 70 (10/15 0715) Resp:  [17-27] 18 (10/15 0715) BP: (108-126)/(56-113) 110/59 mmHg (10/15 0715) SpO2:  [96 %-100 %] 100 % (10/15 0715) Weight:  [195 lb 12.8 oz (88.814 kg)-201 lb 3 oz (91.258 kg)] 195 lb 12.8 oz (88.814 kg) (10/15 0715) Last BM Date: 06/14/14  Weight change: Filed Weights   06/14/14 1712 06/14/14 2152 06/15/14 0715  Weight: 201 lb 3 oz (91.258 kg) 197 lb (89.359 kg) 195 lb 12.8 oz (88.814 kg)    Intake/Output:   Intake/Output Summary (Last 24 hours) at 06/15/14 0819 Last data filed at 06/15/14 0720  Gross per 24 hour  Intake    240 ml  Output   1076 ml  Net   -836 ml     Physical Exam: General:  Chronically ill appearing.  dyspenic with conversation HEENT: normal  Neck: supple. JVP to ear, Carotids 2+ bilaterally; no bruits. No lymphadenopathy or thryomegaly appreciated.  Cor: PMI normal. Regular rate & rhythm. 2/6 SEM RUSB +s3 Lungs: diminished in the bases  Abdomen: soft, nontender, nondistended. No hepatosplenomegaly. No bruits or masses. Good bowel sounds.  Extremities: no cyanosis, clubbing, rash. 2+ ankle edema, L>R  Neuro: alert & orientedx3, cranial nerves grossly intact. Moves all 4 extremities w/o difficulty. Affect pleasant.   Telemetry: atrial fib/flutter with V pacing 73 bpm  Labs: Basic Metabolic Panel:  Recent Labs Lab 06/14/14 1711 06/15/14 0705  NA 141 141  K 5.0 3.9  CL 108 108  CO2 17* 19  GLUCOSE 112* 89  BUN 42* 43*  CREATININE 2.99* 2.95*  CALCIUM 9.2 9.0    Liver Function Tests: No results found for this basename: AST, ALT, ALKPHOS, BILITOT, PROT, ALBUMIN,  in the last 168 hours No results found for this basename: LIPASE, AMYLASE,  in the last 168 hours No results found for this basename: AMMONIA,  in the last 168 hours  CBC:  Recent Labs Lab 06/14/14 1711  WBC 4.7  HGB 10.2*  HCT 29.8*  MCV 87.4  PLT 227    Cardiac Enzymes:  Recent Labs Lab 06/14/14 1930 06/15/14 0110 06/15/14 0705  TROPONINI <0.30 <0.30 <0.30  BNP: BNP (last 3 results)  Recent Labs  04/05/14 1312 04/09/14 0424 06/14/14 1711  PROBNP 5310.0* 3361.0* 7005.0*       Imaging: Dg Chest 1 View  06/14/2014   CLINICAL DATA:  Shortness of Breath starting yesterday, no chest pain  EXAM: CHEST - 1 VIEW  COMPARISON:  04/19/2014  FINDINGS: Cardiomegaly is noted. Three leads cardiac pacemaker is unchanged in position. There is a right arm PICC line with tip in SVC. No acute infiltrate or pulmonary edema.  IMPRESSION: Cardiomegaly. No active disease. Three leads cardiac pacemaker in place.   Electronically Signed   By: Natasha Mead M.D.   On: 06/14/2014 19:00       Medications:     Scheduled Medications: . sodium chloride  3 mL Intravenous Q12H  . warfarin  2.5 mg Oral Once per day on Sun Tue Wed Thu Sat  . [START ON 06/16/2014] warfarin  5 mg Oral Once per day on Mon Fri  . Warfarin - Pharmacist Dosing Inpatient   Does not apply q1800     Infusions: . milrinone 0.25 mcg/kg/min (06/15/14 0052)     PRN Medications:  sodium chloride, sodium chloride, sodium chloride   Assessment:   1) A/C systolic HF 2) Acute hypoxic resp failure 3) NICM - EF 15-20% 4) Afib/flutter - persistent. S/p AV node ablation and CRT 5) AKI on CKD, stage 4 6) HTN 7) Deconditioning   Plan/Discussion:    Very difficult situation. Unfortunately [name] is suffering from end stage heart failure. She was admitted in August and had a lengthy stay and was started on milrinone, however renal function and weight has continued to trend up. She is massively volume overloaded, weight about 25 lbs above baseline.   She continues to be in atrial fibrillation/flutter which could be contrbiuting to her decompensated HF. She was started amiodarone and with plan for repeat DCCV once 4 therapeutic INRs to see if this could help with her CO. She has had 2 weeks of therapeutic INRs. Will need to set up for TEE-DC-CV.  Will continue milrinone 0.25 mcg through PICC and continue IV lasix 80 mg IV BID. Will restart amiodarone, hydralazine 12.5 mg TID (1/2 dose), Imdur 30 mg daily and atorvastatin 20 mg daily.   May need to get Palliative Care involved to discuss goals of care.   Length of Stay: 1  Aundria Rud 06/15/2014, 8:19 AM  Advanced Heart Failure Team Pager [phone] (M-F; 7a - 4p)  Please contact [facility] for night-coverage after hours (4p -7a ) and weekends on [email or web address]  Patient seen and examined with Ulla Potash, NP. We discussed all aspects of the encounter. I agree with the assessment and plan as stated above.   She has marked volume  overload and respiratory distress. Minimal response to IV lasix. Will increase lasix to 120 IV tid and add metolazone. Place TED hose. Place on continuous pulse ox and start nasal cannula O2. Will need to check co-ox and possibly increase milrinone. Restoring NSR may help but will need to wait until resp status improves before proceeding with TEE and DC-CV. Continue amiodarone. Stop KCL with renal failure. Prognosis quite guarded. May need Palliative Care involvement.   Reuel Boom Sixto Bowdish,MD 7:42 PM

## 2014-06-15 NOTE — Progress Notes (Signed)
[facility]  Patient Status: Active (receiving services up to time of hospitalization)  [facility] is providing the following services: RN and Home Infusion Services (teaching and education will be done by nurse in the home with patient and caregiver)  If patient discharges after hours, please call [phone].   Kizzie Furnish 06/15/2014, 11:00 AM

## 2014-06-15 NOTE — Progress Notes (Signed)
ANTICOAGULATION CONSULT NOTE - Initial Consult  Pharmacy Consult for Coumadin Indication: atrial fibrillation  Allergies  Allergen Reactions  . Tuberculin Tests Swelling    Patient Measurements: Height: 5\' 4"  (162.6 cm) Weight: 197 lb (89.359 kg) (b scale) IBW/kg (Calculated) : 54.7  Vital Signs: Temp: 97.7 F (36.5 C) (10/14 2152) Temp Source: Oral (10/14 2152) BP: 121/63 mmHg (10/14 2152) Pulse Rate: 75 (10/14 2152)  Labs:  Recent Labs  06/14/14 1711 06/14/14 1930  HGB 10.2*  --   HCT 29.8*  --   PLT 227  --   LABPROT 31.0* 31.0*  INR 2.96* 2.96*  CREATININE 2.99*  --   TROPONINI  --  <0.30    Estimated Creatinine Clearance: 16.5 ml/min (by C-G formula based on Cr of 2.99).   Medical History: Past Medical History  Diagnosis Date  . Nonischemic cardiomyopathy     a) Initial dx 1992 b) nl coronary angios in 1/03 c) AICD/bivent. PM - 4/09;    . Hypertension   . CKD (chronic kidney disease), stage IV   . PSVT (paroxysmal supraventricular tachycardia)     s/p radiofrequency ablation  . Gout     uric acid of 10 6/11  . Anemia   . Thrombus     left upper extremity; following device insertion  . Chronic anticoagulation   . Coronary artery disease   . Chronic systolic heart failure     a) NICM b) EF 40-45% (01/2009) c) EF 20-25% (04/2014) d) RHC (04/2014): RA 9, RV 34/2/6, PA 34/16 (24), PCWP: 12, Fick CO/CI: 3.6 /1.8, PA sat 50% and 52%  . Automatic implantable cardioverter-defibrillator in situ   . Hepatitis 1962    "don't know which kind"  . CVA (cerebral vascular accident) 1992    "memory not always clear since"  . Chronic mid back pain   . AV node dysfunction     a) AV node ablation in 2010 and repeat 2015  . CHF (congestive heart failure)   . History of blood transfusion     "w/pregnancy; maybe last time I was in hospital" (06/14/2014)  . DJD (degenerative joint disease)     knees; right hip  . Arthritis     "knees, back" (06/14/2014)     Medications:  Prescriptions prior to admission  Medication Sig Dispense Refill  . allopurinol (ZYLOPRIM) 100 MG tablet Take 100 mg by mouth daily.      Marland Kitchen amiodarone (PACERONE) 200 MG tablet Take 1 tablet (200 mg total) by mouth 2 (two) times daily.  180 tablet  1  . atorvastatin (LIPITOR) 20 MG tablet Take 1 tablet (20 mg total) by mouth daily.  90 tablet  3  . colchicine 0.6 MG tablet Take 1 tablet (0.6 mg total) by mouth daily.  15 tablet  1  . ferrous sulfate 325 (65 FE) MG tablet Take 1 tablet (325 mg total) by mouth 2 (two) times daily with a meal.  60 tablet  0  . furosemide (LASIX) 40 MG tablet Take 2 tablets (80 mg total) by mouth daily.  180 tablet  1  . hydrALAZINE (APRESOLINE) 25 MG tablet Take 1 tablet (25 mg total) by mouth 3 (three) times daily.  270 tablet  1  . isosorbide mononitrate (IMDUR) 60 MG 24 hr tablet Take 1 tablet (60 mg total) by mouth daily.  90 tablet  1  . Multiple Vitamin (MULTIVITAMIN) tablet Take 0.5 tablets by mouth daily.       . Omega-3 Fatty  Acids (FISH OIL) 1000 MG CAPS Take 1 capsule by mouth daily.      . potassium chloride SA (K-DUR,KLOR-CON) 20 MEQ tablet Take 2 tablets (40 mEq total) by mouth 2 (two) times daily.  270 tablet  1  . warfarin (COUMADIN) 2.5 MG tablet Take 1 tablet per day on Sun Tue Wed Thu Sat and 2 tabs per day on Mon & Fri.  60 tablet  0  . nitroGLYCERIN (NITROSTAT) 0.4 MG SL tablet Place 0.4 mg under the tongue every 5 (five) minutes as needed for chest pain.        Scheduled:  . sodium chloride  3 mL Intravenous Q12H   Infusions:  . milrinone      Assessment: 80yo female c/o worsening SOB and extremity swelling over 1wk, admitted for CHF, to continue Coumadin for Afib; INR currently therapeutic w/ plan for DCCV next week which requires INR >2.  Goal of Therapy:  INR 2-3   Plan:  Will continue home Coumadin dose of 2.5mg  daily except 5mg  MonFri and monitor INR for dose adjustments.  Vernard GamblesVeronda Roderic Lammert, PharmD, BCPS   06/15/2014,12:08 AM

## 2014-06-16 LAB — BASIC METABOLIC PANEL
Anion gap: 15 (ref 5–15)
BUN: 44 mg/dL — ABNORMAL HIGH (ref 6–23)
CALCIUM: 9 mg/dL (ref 8.4–10.5)
CO2: 21 meq/L (ref 19–32)
Chloride: 104 mEq/L (ref 96–112)
Creatinine, Ser: 2.98 mg/dL — ABNORMAL HIGH (ref 0.50–1.10)
GFR calc Af Amer: 16 mL/min — ABNORMAL LOW (ref >90)
GFR calc non Af Amer: 14 mL/min — ABNORMAL LOW (ref >90)
GLUCOSE: 86 mg/dL (ref 70–99)
Potassium: 3.6 mEq/L — ABNORMAL LOW (ref 3.7–5.3)
SODIUM: 140 meq/L (ref 137–147)

## 2014-06-16 LAB — PROTIME-INR
INR: 2.87 — ABNORMAL HIGH (ref 0.00–1.49)
PROTHROMBIN TIME: 30.3 s — AB (ref 11.6–15.2)

## 2014-06-16 MED ORDER — POTASSIUM CHLORIDE CRYS ER 20 MEQ PO TBCR
40.0000 meq | EXTENDED_RELEASE_TABLET | Freq: Once | ORAL | Status: AC
  Administered 2014-06-16: 40 meq via ORAL
  Filled 2014-06-16: qty 2

## 2014-06-16 MED ORDER — COLCHICINE 0.6 MG PO TABS
0.3000 mg | ORAL_TABLET | Freq: Every day | ORAL | Status: DC
  Administered 2014-06-17 – 2014-06-22 (×6): 0.3 mg via ORAL
  Filled 2014-06-16 (×6): qty 0.5

## 2014-06-16 MED ORDER — HYDRALAZINE HCL 25 MG PO TABS
25.0000 mg | ORAL_TABLET | Freq: Three times a day (TID) | ORAL | Status: DC
  Administered 2014-06-17 – 2014-06-20 (×9): 25 mg via ORAL
  Filled 2014-06-16 (×13): qty 1

## 2014-06-16 MED ORDER — DEXTROSE 5 % IV SOLN
120.0000 mg | Freq: Two times a day (BID) | INTRAVENOUS | Status: DC
  Administered 2014-06-17 – 2014-06-18 (×2): 120 mg via INTRAVENOUS
  Filled 2014-06-16 (×6): qty 12

## 2014-06-16 NOTE — Progress Notes (Signed)
Patient is A/Ox4 and is ambulatory with 1 person assist. Pt.had no c/o pain and no signs of distress during the shift.

## 2014-06-16 NOTE — Progress Notes (Signed)
PT Cancellation Note  Patient Details Name: GENEVEIVE SATKOWIAK MRN: [id number] DOB: 1933-04-11   Cancelled Treatment:    Reason Eval/Treat Not Completed: Patient declined, no reason specified.  Pt stating she feels SOB and declined mobility at this time.     Cailie Bosshart, Alison Murray 06/16/2014, 10:47 AM

## 2014-06-16 NOTE — Progress Notes (Addendum)
ANTICOAGULATION CONSULT NOTE   Pharmacy Consult for Coumadin Indication: atrial fibrillation  Allergies  Allergen Reactions  . Tuberculin Tests Swelling    Patient Measurements: Height: 5\' 4"  (162.6 cm) Weight: 192 lb 3.2 oz (87.181 kg) (Scale B) IBW/kg (Calculated) : 54.7  Vital Signs: Temp: 97.8 F (36.6 C) (10/16 0900) Temp Source: Oral (10/16 0900) BP: 114/54 mmHg (10/16 0943) Pulse Rate: 71 (10/16 0943)  Labs:  Recent Labs  06/14/14 1711 06/14/14 1930 06/15/14 0110 06/15/14 0705 06/16/14 0535  HGB 10.2*  --   --   --   --   HCT 29.8*  --   --   --   --   PLT 227  --   --   --   --   LABPROT 31.0* 31.0*  --  30.4* 30.3*  INR 2.96* 2.96*  --  2.88* 2.87*  CREATININE 2.99*  --   --  2.95* 2.98*  TROPONINI  --  <0.30 <0.30 <0.30  --     Estimated Creatinine Clearance: 16.4 ml/min (by C-G formula based on Cr of 2.98).  Assessment: 80yo female c/o worsening SOB and extremity swelling over 1wk, admitted for CHF, to continue Coumadin for Afib; INR continues to be therapeutic w/ plan for DCCV next week which requires INR >2.  Goal of Therapy:  INR 2-3   Plan:  Will continue home Coumadin dose of 2.5mg  daily except 5mg  MonFri and monitor INR for dose adjustments.  Sheppard Coil PharmD., BCPS Clinical Pharmacist Pager [phone] 06/16/2014 11:01 AM

## 2014-06-16 NOTE — Plan of Care (Signed)
Problem: Phase I Progression Outcomes Goal: EF % per last Echo/documented,Core Reminder form on chart Outcome: Completed/Met Date Met:  06/16/14 EF20-25%(20-25%)

## 2014-06-16 NOTE — Progress Notes (Signed)
Advanced Heart Failure Rounding Note   Subjective:    [name] is a 80 y.o. female with a history of NICM s/p BiV ICD, HTN, chronic systolic HF, permanent afib (s/p AVN ablation 2010 and again repeat 2015), CVA, DM2 and CKD stage IV who presents to [facility] today with SOB.   ECHO (04/2014): EF 15-20%, grade III DD, severe MR, biatrial enlargement, mod TR.   Admitted 04/2014 with increased SOB and fatigue. ICD interrogated and showed 1/3 of ventricular beats actually conducted through AV node limiting BiV pacing and underwent repeat AV node ablation. Had RHC showing low output and started on milrinone. Failed milrinone wean. Loaded with amiodarone for potential DCCV. She was seen on 06/07/14 in CHF clinic by Dr. Shirlee Latch and continued on Milrinone at 0.25 mcg. Her weight was 194lbs and she was felt volume overloaded at that time. Lasix, which was previous held due to AKI was then increased lasix to 80 mg daily and spironolactone discontinued. Patients dry weight 168 lbs.   Presented to ED 10/14 with increased SOB and hypoxia with ambulation. Pro-BNP 7005, creatinine 2.99 and BUN 42.   Increased lasix to 120 mg IV TID yesterday with metolazone. Weight down 3 lbs and 24 hr I/O -2.3 liters. CO-ox 65%. Breathing somewhat improved. +orthopnea.    Objective:   Weight Range:  Vital Signs:   Temp:  [97.6 F (36.4 C)-98.1 F (36.7 C)] 97.7 F (36.5 C) (10/16 1300) Pulse Rate:  [70-75] 72 (10/16 1300) Resp:  [18-22] 20 (10/16 1300) BP: (101-125)/(51-77) 125/59 mmHg (10/16 1300) SpO2:  [98 %-100 %] 100 % (10/16 1300) Weight:  [192 lb 3.2 oz (87.181 kg)] 192 lb 3.2 oz (87.181 kg) (10/16 0521) Last BM Date: 06/15/14  Weight change: Filed Weights   06/14/14 2152 06/15/14 0715 06/16/14 0521  Weight: 197 lb (89.359 kg) 195 lb 12.8 oz (88.814 kg) 192 lb 3.2 oz (87.181 kg)    Intake/Output:   Intake/Output Summary (Last 24 hours) at 06/16/14 1426 Last data filed at 06/16/14 1312  Gross per 24  hour  Intake 1586.12 ml  Output   4550 ml  Net -2963.88 ml     Physical Exam: General: Chronically ill appearing. Sitting elevated in bed HEENT: normal  Neck: supple. JVP to ear, Carotids 2+ bilaterally; no bruits. No lymphadenopathy or thryomegaly appreciated.  Cor: PMI normal. Regular rate & rhythm. 2/6 SEM RUSB +s3 Lungs: diminished in the bases  Abdomen: soft, nontender, nondistended. No hepatosplenomegaly. No bruits or masses. Good bowel sounds.  Extremities: no cyanosis, clubbing, rash. 1-+ ankle edema, L>R  Neuro: alert & orientedx3, cranial nerves grossly intact. Moves all 4 extremities w/o difficulty. Affect pleasant.   Telemetry: atrial fib/flutter with V pacing   Labs: Basic Metabolic Panel:  Recent Labs Lab 06/14/14 1711 06/15/14 0705 06/16/14 0535  NA 141 141 140  K 5.0 3.9 3.6*  CL 108 108 104  CO2 17* 19 21  GLUCOSE 112* 89 86  BUN 42* 43* 44*  CREATININE 2.99* 2.95* 2.98*  CALCIUM 9.2 9.0 9.0    Liver Function Tests: No results found for this basename: AST, ALT, ALKPHOS, BILITOT, PROT, ALBUMIN,  in the last 168 hours No results found for this basename: LIPASE, AMYLASE,  in the last 168 hours No results found for this basename: AMMONIA,  in the last 168 hours  CBC:  Recent Labs Lab 06/14/14 1711  WBC 4.7  HGB 10.2*  HCT 29.8*  MCV 87.4  PLT 227    Cardiac  Enzymes:  Recent Labs Lab 06/14/14 1930 06/15/14 0110 06/15/14 0705  TROPONINI <0.30 <0.30 <0.30    BNP: BNP (last 3 results)  Recent Labs  04/05/14 1312 04/09/14 0424 06/14/14 1711  PROBNP 5310.0* 3361.0* 7005.0*       Imaging: Dg Chest 1 View  06/14/2014   CLINICAL DATA:  Shortness of Breath starting yesterday, no chest pain  EXAM: CHEST - 1 VIEW  COMPARISON:  04/19/2014  FINDINGS: Cardiomegaly is noted. Three leads cardiac pacemaker is unchanged in position. There is a right arm PICC line with tip in SVC. No acute infiltrate or pulmonary edema.  IMPRESSION:  Cardiomegaly. No active disease. Three leads cardiac pacemaker in place.   Electronically Signed   By: Natasha Mead M.D.   On: 06/14/2014 19:00     Medications:     Scheduled Medications: . allopurinol  100 mg Oral Daily  . amiodarone  200 mg Oral BID  . atorvastatin  20 mg Oral q1800  . [START ON 06/17/2014] colchicine  0.3 mg Oral Daily  . ferrous sulfate  325 mg Oral Q breakfast  . furosemide  120 mg Intravenous TID  . hydrALAZINE  12.5 mg Oral 3 times per day  . isosorbide mononitrate  30 mg Oral Daily  . metolazone  5 mg Oral Q12H  . sodium chloride  3 mL Intravenous Q12H  . warfarin  2.5 mg Oral Once per day on Sun Tue Wed Thu Sat  . warfarin  5 mg Oral Once per day on Mon Fri  . Warfarin - Pharmacist Dosing Inpatient   Does not apply q1800    Infusions: . milrinone 0.25 mcg/kg/min (06/16/14 0232)    PRN Medications: sodium chloride, sodium chloride, sodium chloride   Assessment:   1) A/C systolic HF 2) Acute hypoxic resp failure 3) NICM - EF 15-20% 4) Afib/flutter - persistent. S/p AV node ablation and CRT 5) AKI on CKD, stage 4 6) HTN 7) Deconditioning   Plan/Discussion:    Very difficult situation. Unfortunately [name] is suffering from end stage heart failure. She was admitted in August and had a lengthy stay and was started on milrinone, however renal function and weight has continued to trend up.   Lasix increased to 120 mg TID with metolazone yesterday. Weight is down 3 lbs and 24 hr I/O -2.3 liters. She remains about 20 lbs above baseline and renal function remains elevated. Will continue current diuretic regimen, but am concerned we may not be able to get much more off. Her co-ox was 65% will continue milrinone 0.375 mcg.   She continues to be in atrial fibrillation/flutter which could be contrbiuting to her decompensated HF. She was started amiodarone and with plan for repeat DCCV once 4 therapeutic INRs to see if this could help with her CO. She  has had 2 weeks of therapeutic INRs. Will discuss with Dr. Gala Romney whether he think TEE-DC-CV could be of any benefit.    May need to get Palliative Care involved to discuss goals of care.   Length of Stay: 2  Aundria Rud 06/16/2014, 2:26 PM  Advanced Heart Failure Team Pager [phone] (M-F; 7a - 4p)  Please contact [facility] for night-coverage after hours (4p -7a ) and weekends on [email or web address]  Patient seen and examined with Ulla Potash, NP. We discussed all aspects of the encounter. I agree with the assessment and plan as stated above.   Improved with diuresis but remains extremely tenuous with prominent s3 on exam despite  milrinone support. Will continue home milrinone. Can decrease lasix to 120 tid. Will plan TEE/DC-CV on Monday to restore NSR and hopefully improve her hemodynamics though I suspect we are nearing the point where Palliative Care may be her best option.  Over weekend would continue to diurese.   Brandi Friese,MD 10:03 PM

## 2014-06-17 DIAGNOSIS — E876 Hypokalemia: Secondary | ICD-10-CM

## 2014-06-17 LAB — MAGNESIUM: Magnesium: 1.4 mg/dL — ABNORMAL LOW (ref 1.5–2.5)

## 2014-06-17 LAB — POTASSIUM: Potassium: 4 mEq/L (ref 3.7–5.3)

## 2014-06-17 LAB — BASIC METABOLIC PANEL
Anion gap: 13 (ref 5–15)
BUN: 37 mg/dL — ABNORMAL HIGH (ref 6–23)
CALCIUM: 7.6 mg/dL — AB (ref 8.4–10.5)
CO2: 24 mEq/L (ref 19–32)
Chloride: 103 mEq/L (ref 96–112)
Creatinine, Ser: 2.21 mg/dL — ABNORMAL HIGH (ref 0.50–1.10)
GFR calc Af Amer: 23 mL/min — ABNORMAL LOW (ref >90)
GFR, EST NON AFRICAN AMERICAN: 20 mL/min — AB (ref >90)
GLUCOSE: 82 mg/dL (ref 70–99)
Potassium: 2.4 mEq/L — CL (ref 3.7–5.3)
Sodium: 140 mEq/L (ref 137–147)

## 2014-06-17 LAB — PROTIME-INR
INR: 3.17 — ABNORMAL HIGH (ref 0.00–1.49)
Prothrombin Time: 32.8 seconds — ABNORMAL HIGH (ref 11.6–15.2)

## 2014-06-17 LAB — CARBOXYHEMOGLOBIN
Carboxyhemoglobin: 1.6 % — ABNORMAL HIGH (ref 0.5–1.5)
METHEMOGLOBIN: 0.6 % (ref 0.0–1.5)
O2 SAT: 94 %
Total hemoglobin: 9.3 g/dL — ABNORMAL LOW (ref 12.0–16.0)

## 2014-06-17 MED ORDER — POTASSIUM CHLORIDE ER 10 MEQ PO TBCR
20.0000 meq | EXTENDED_RELEASE_TABLET | Freq: Two times a day (BID) | ORAL | Status: DC
  Administered 2014-06-17 – 2014-06-19 (×5): 20 meq via ORAL
  Filled 2014-06-17 (×6): qty 2

## 2014-06-17 MED ORDER — WARFARIN SODIUM 2.5 MG PO TABS
2.5000 mg | ORAL_TABLET | ORAL | Status: DC
  Filled 2014-06-17: qty 1

## 2014-06-17 MED ORDER — INFLUENZA VAC SPLIT QUAD 0.5 ML IM SUSY
0.5000 mL | PREFILLED_SYRINGE | INTRAMUSCULAR | Status: AC
  Administered 2014-06-18: 0.5 mL via INTRAMUSCULAR
  Filled 2014-06-17: qty 0.5

## 2014-06-17 MED ORDER — POTASSIUM CHLORIDE CRYS ER 20 MEQ PO TBCR
40.0000 meq | EXTENDED_RELEASE_TABLET | Freq: Once | ORAL | Status: AC
  Administered 2014-06-17: 40 meq via ORAL
  Filled 2014-06-17: qty 2

## 2014-06-17 MED ORDER — POTASSIUM CHLORIDE 10 MEQ/100ML IV SOLN
10.0000 meq | INTRAVENOUS | Status: AC
  Administered 2014-06-17 (×4): 10 meq via INTRAVENOUS
  Filled 2014-06-17 (×5): qty 100

## 2014-06-17 MED ORDER — WARFARIN 0.5 MG HALF TABLET
0.5000 mg | ORAL_TABLET | Freq: Once | ORAL | Status: AC
  Administered 2014-06-17: 0.5 mg via ORAL
  Filled 2014-06-17: qty 1

## 2014-06-17 NOTE — Progress Notes (Addendum)
Advanced Heart Failure Rounding Note   Subjective:    [name] is a 80 y.o. female with a history of NICM s/p BiV ICD, HTN, chronic systolic HF, permanent afib (s/p AVN ablation 2010 and again repeat 2015), CVA, DM2 and CKD stage IV who [facility] today with SOB.   ECHO (04/2014): EF 15-20%, grade III DD, severe MR, biatrial enlargement, mod TR.   Admitted 04/2014 with increased SOB and fatigue. ICD interrogated and showed 1/3 of ventricular beats actually conducted through AV node limiting BiV pacing and underwent repeat AV node ablation. Had RHC showing low output and started on milrinone. Failed milrinone wean. Loaded with amiodarone for potential DCCV. She was seen on 06/07/14 in CHF clinic by Dr. Shirlee LatchMcLean and continued on Milrinone at 0.25 mcg. Her weight was 194lbs and she was felt volume overloaded at that time. Lasix, which was previous held due to AKI was then increased lasix to 80 mg daily and spironolactone discontinued. Patients dry weight 168 lbs.   Presented to ED 10/14 with increased SOB and hypoxia with ambulation. Pro-BNP 7005, creatinine 2.99 and BUN 42.   Lasix decreased from 120 mg IV TID to 120 BID yesterday. Still getting metolazone. Weight reportedly down 9 lbs and K 2.4 and been supplemented now 4.0. Breathing improved. Cr improved 2.98-> 2.21   Objective:   Weight Range:  Vital Signs:   Temp:  [97.5 F (36.4 C)-97.7 F (36.5 C)] 97.5 F (36.4 C) (10/17 0617) Pulse Rate:  [63-72] 72 (10/17 1006) Resp:  [18-20] 18 (10/17 0828) BP: (106-132)/(45-72) 108/55 mmHg (10/17 1006) SpO2:  [100 %] 100 % (10/17 0828) Weight:  [83.054 kg (183 lb 1.6 oz)] 83.054 kg (183 lb 1.6 oz) (10/17 0617) Last BM Date: 06/17/14  Weight change: Filed Weights   06/15/14 0715 06/16/14 0521 06/17/14 0617  Weight: 88.814 kg (195 lb 12.8 oz) 87.181 kg (192 lb 3.2 oz) 83.054 kg (183 lb 1.6 oz)    Intake/Output:   Intake/Output Summary (Last 24 hours) at 06/17/14 1228 Last  data filed at 06/17/14 1123  Gross per 24 hour  Intake 1503.7 ml  Output   4350 ml  Net -2846.3 ml     Physical Exam: General: Chronically ill appearing. Sitting in bed HEENT: normal  Neck: supple. JVP to jaw, Carotids 2+ bilaterally; no bruits. No lymphadenopathy or thryomegaly appreciated.  Cor: PMI normal. Regular rate & rhythm. 2/6 SEM RUSB +s3 Lungs: diminished in the bases  Abdomen: soft, nontender, nondistended. No hepatosplenomegaly. No bruits or masses. Good bowel sounds.  Extremities: no cyanosis, clubbing, rash. 1-+ ankle edema, L>R  Neuro: alert & orientedx3, cranial nerves grossly intact. Moves all 4 extremities w/o difficulty. Affect pleasant.   Telemetry: atrial fib with V pacing   Labs: Basic Metabolic Panel:  Recent Labs Lab 06/14/14 1711 06/15/14 0705 06/16/14 0535 06/17/14 0615 06/17/14 1145  NA 141 141 140 140  --   K 5.0 3.9 3.6* 2.4* 4.0  CL 108 108 104 103  --   CO2 17* 19 21 24   --   GLUCOSE 112* 89 86 82  --   BUN 42* 43* 44* 37*  --   CREATININE 2.99* 2.95* 2.98* 2.21*  --   CALCIUM 9.2 9.0 9.0 7.6*  --   MG  --   --   --  1.4*  --     Liver Function Tests: No results found for this basename: AST, ALT, ALKPHOS, BILITOT, PROT, ALBUMIN,  in the last 168  hours No results found for this basename: LIPASE, AMYLASE,  in the last 168 hours No results found for this basename: AMMONIA,  in the last 168 hours  CBC:  Recent Labs Lab 06/14/14 1711  WBC 4.7  HGB 10.2*  HCT 29.8*  MCV 87.4  PLT 227    Cardiac Enzymes:  Recent Labs Lab 06/14/14 1930 06/15/14 0110 06/15/14 0705  TROPONINI <0.30 <0.30 <0.30    BNP: BNP (last 3 results)  Recent Labs  04/05/14 1312 04/09/14 0424 06/14/14 1711  PROBNP 5310.0* 3361.0* 7005.0*       Imaging: No results found.   Medications:     Scheduled Medications: . allopurinol  100 mg Oral Daily  . amiodarone  200 mg Oral BID  . atorvastatin  20 mg Oral q1800  . colchicine  0.3 mg  Oral Daily  . ferrous sulfate  325 mg Oral Q breakfast  . furosemide  120 mg Intravenous BID  . hydrALAZINE  25 mg Oral 3 times per day  . isosorbide mononitrate  30 mg Oral Daily  . metolazone  5 mg Oral Q12H  . potassium chloride  10 mEq Intravenous Q1 Hr x 4  . sodium chloride  3 mL Intravenous Q12H  . warfarin  0.5 mg Oral ONCE-1800  . [START ON 06/18/2014] warfarin  2.5 mg Oral Once per day on Sun Tue Wed Thu Sat  . warfarin  5 mg Oral Once per day on Mon Fri  . Warfarin - Pharmacist Dosing Inpatient   Does not apply q1800    Infusions: . milrinone 0.25 mcg/kg/min (06/17/14 0731)    PRN Medications: sodium chloride, sodium chloride, sodium chloride   Assessment:   1) A/C systolic HF 2) Acute hypoxic resp failure 3) NICM - EF 15-20% 4) Afib/flutter - persistent. S/p AV node ablation and CRT 5) AKI on CKD, stage 4 6) HTN 7) Deconditioning  8) Hypokalemia  Plan/Discussion:    Very difficult situation. Unfortunately [name] is suffering from end stage heart failure and has been on home milrinone and admitted with recurrent ADHF and a/c renal failure.  Much improved with IV lasix. Weight down 18 pounds. Breathing better but still tenuous. JVP up and prominent s3 on exam Will continue IV diuresis. K has been supped.   Will plan TEE/DC-CV on Monday to restore NSR and hopefully improve her hemodynamics though I suspect we are nearing the point where Palliative Care may be her best option.  I discussed at length with her and her husband and they agree that if she develops refractory sx would want comfort care.  Daniel Bensimhon,MD 12:28 PM

## 2014-06-17 NOTE — Progress Notes (Signed)
   Was notified by RN of critical lab value with K of 2.4. Patient is on high dose diuretic therapy: 120 mg IV Lasix BID and 5 mg of Zaroxolyn BID. RN notified to hold diuretics this am. Supplemental K ordered. Will also check a stat Mg. Order placed for K to be rechecked at 11:00.   [name] 06/17/2014

## 2014-06-17 NOTE — Progress Notes (Signed)
Patient had 5 beat run of V-tach on monitor.  Per patient, "it felt like someone was sticking me".  Vital signs stable.  Cardiology PA on call notified, orders placed.  Patient updated on plan of care.  Will continue to monitor.

## 2014-06-17 NOTE — Progress Notes (Signed)
ANTICOAGULATION CONSULT NOTE - Follow Up Consult  Pharmacy Consult for Coumadin Indication: atrial fibrillation  Allergies  Allergen Reactions  . Tuberculin Tests Swelling    Patient Measurements: Height: 5\' 4"  (162.6 cm) Weight: 183 lb 1.6 oz (83.054 kg) IBW/kg (Calculated) : 54.7 Heparin Dosing Weight:   Vital Signs: Temp: 97.5 F (36.4 C) (10/17 0617) Temp Source: Oral (10/17 0617) BP: 108/55 mmHg (10/17 1006) Pulse Rate: 72 (10/17 1006)  Labs:  Recent Labs  06/14/14 1711 06/14/14 1930 06/15/14 0110 06/15/14 0705 06/16/14 0535 06/17/14 0615  HGB 10.2*  --   --   --   --   --   HCT 29.8*  --   --   --   --   --   PLT 227  --   --   --   --   --   LABPROT 31.0* 31.0*  --  30.4* 30.3* 32.8*  INR 2.96* 2.96*  --  2.88* 2.87* 3.17*  CREATININE 2.99*  --   --  2.95* 2.98* 2.21*  TROPONINI  --  <0.30 <0.30 <0.30  --   --     Estimated Creatinine Clearance: 21.5 ml/min (by C-G formula based on Cr of 2.21).   Medications:  Scheduled:  . allopurinol  100 mg Oral Daily  . amiodarone  200 mg Oral BID  . atorvastatin  20 mg Oral q1800  . colchicine  0.3 mg Oral Daily  . ferrous sulfate  325 mg Oral Q breakfast  . furosemide  120 mg Intravenous BID  . hydrALAZINE  25 mg Oral 3 times per day  . isosorbide mononitrate  30 mg Oral Daily  . metolazone  5 mg Oral Q12H  . potassium chloride  10 mEq Intravenous Q1 Hr x 4  . sodium chloride  3 mL Intravenous Q12H  . warfarin  2.5 mg Oral Once per day on Sun Tue Wed Thu Sat  . warfarin  5 mg Oral Once per day on Mon Fri  . Warfarin - Pharmacist Dosing Inpatient   Does not apply q1800    Assessment: 80yo female with AFib, currently on home dose of Coumadin 2.5mg  daily x 5mg  on MonFri.  INR 3.17 this AM & received 5mg  yesterday- may increase INR again tomorrow.  Pt on Amiodarone, same dose as at home.  No bleeding noted.  Will adjust dose today.  Goal of Therapy:  INR 2-3 Monitor platelets by anticoagulation protocol:  Yes   Plan:  1-  Coumadin 0.5mg  today 2-  F/U INR in AM  Marisue Humble, PharmD Clinical Pharmacist  System- [facility]

## 2014-06-17 NOTE — Progress Notes (Signed)
Received phone call from lab to notify this writer of critical potassium level of 2.4. Dr. Blanchie Dessert service paged to make aware of lab value. Received return phone-call from [place], [place] who Informed this writer to hold lasix IV, and stated she will put in orders for potassium replacement  and an order for a magnesium level as well. This information was relayed to oncoming day-shift RN, [place].  Sharlene Dory, RN

## 2014-06-17 NOTE — Progress Notes (Signed)
Patient is sleeping peacefully. Maintained on telemetry and is V-paced with infrequent trigemeny PVCs on the monitor. Will continue to monitor.  Desmon Hitchner, RN

## 2014-06-17 NOTE — Progress Notes (Signed)
Patient is sleeping peacefully. Maintained on the monitor and is V-paced in the 70s, with an underlying rhythm of A-Fib. Will continue to monitor.  Sharlene Dory, RN

## 2014-06-18 DIAGNOSIS — I10 Essential (primary) hypertension: Secondary | ICD-10-CM

## 2014-06-18 DIAGNOSIS — N179 Acute kidney failure, unspecified: Secondary | ICD-10-CM

## 2014-06-18 DIAGNOSIS — I5023 Acute on chronic systolic (congestive) heart failure: Secondary | ICD-10-CM | POA: Diagnosis not present

## 2014-06-18 DIAGNOSIS — E876 Hypokalemia: Secondary | ICD-10-CM

## 2014-06-18 DIAGNOSIS — J9621 Acute and chronic respiratory failure with hypoxia: Secondary | ICD-10-CM

## 2014-06-18 DIAGNOSIS — N189 Chronic kidney disease, unspecified: Secondary | ICD-10-CM

## 2014-06-18 LAB — BASIC METABOLIC PANEL
ANION GAP: 14 (ref 5–15)
BUN: 40 mg/dL — AB (ref 6–23)
CHLORIDE: 97 meq/L (ref 96–112)
CO2: 27 mEq/L (ref 19–32)
Calcium: 8.4 mg/dL (ref 8.4–10.5)
Creatinine, Ser: 2.44 mg/dL — ABNORMAL HIGH (ref 0.50–1.10)
GFR calc non Af Amer: 18 mL/min — ABNORMAL LOW (ref >90)
GFR, EST AFRICAN AMERICAN: 21 mL/min — AB (ref >90)
Glucose, Bld: 97 mg/dL (ref 70–99)
Potassium: 3.1 mEq/L — ABNORMAL LOW (ref 3.7–5.3)
Sodium: 138 mEq/L (ref 137–147)

## 2014-06-18 LAB — PROTIME-INR
INR: 3 — ABNORMAL HIGH (ref 0.00–1.49)
PROTHROMBIN TIME: 31.4 s — AB (ref 11.6–15.2)

## 2014-06-18 LAB — CARBOXYHEMOGLOBIN
Carboxyhemoglobin: 1.4 % (ref 0.5–1.5)
METHEMOGLOBIN: 0.7 % (ref 0.0–1.5)
O2 Saturation: 69.3 %
Total hemoglobin: 10.2 g/dL — ABNORMAL LOW (ref 12.0–16.0)

## 2014-06-18 LAB — MAGNESIUM: MAGNESIUM: 1.6 mg/dL (ref 1.5–2.5)

## 2014-06-18 MED ORDER — WARFARIN SODIUM 2.5 MG PO TABS
2.5000 mg | ORAL_TABLET | Freq: Once | ORAL | Status: AC
  Administered 2014-06-18: 2.5 mg via ORAL
  Filled 2014-06-18: qty 1

## 2014-06-18 MED ORDER — MAGNESIUM SULFATE 40 MG/ML IJ SOLN
2.0000 g | Freq: Once | INTRAMUSCULAR | Status: AC
  Administered 2014-06-18: 2 g via INTRAVENOUS
  Filled 2014-06-18: qty 50

## 2014-06-18 MED ORDER — POTASSIUM CHLORIDE CRYS ER 20 MEQ PO TBCR
60.0000 meq | EXTENDED_RELEASE_TABLET | Freq: Once | ORAL | Status: AC
  Administered 2014-06-18: 60 meq via ORAL
  Filled 2014-06-18: qty 3

## 2014-06-18 MED ORDER — WARFARIN 0.5 MG HALF TABLET
0.5000 mg | ORAL_TABLET | Freq: Once | ORAL | Status: DC
  Filled 2014-06-18: qty 1

## 2014-06-18 MED ORDER — BENZONATATE 100 MG PO CAPS
100.0000 mg | ORAL_CAPSULE | Freq: Three times a day (TID) | ORAL | Status: DC | PRN
  Administered 2014-06-18: 100 mg via ORAL
  Filled 2014-06-18 (×2): qty 1

## 2014-06-18 NOTE — Progress Notes (Signed)
Cardiologist on call notified as patient is requesting PRN for dry, nonproductive cough.  Patient has crackles in bilateral lung bases, change from clear this AM.  PRN ordered by MD.  Patient notified.  Will continue to monitor.

## 2014-06-18 NOTE — Progress Notes (Signed)
Subjective:    Brandi HackMargaret L [name] is a 80 y.o. female with a history of NICM s/p BiV ICD, HTN, chronic systolic HF, permanent afib (s/p AVN ablation 2010 and again repeat 2015), CVA, DM2 and CKD stage IV who [facility].   ECHO (04/2014): EF 15-20%, grade III DD, severe MR, biatrial enlargement, mod TR.   Admitted 04/2014 with increased SOB and fatigue. ICD interrogated and showed 1/3 of ventricular beats actually conducted through AV node limiting BiV pacing and underwent repeat AV node ablation. Had RHC showing low output and started on milrinone. Failed milrinone wean. Loaded with amiodarone for potential DCCV. She was seen on 06/07/14 in CHF clinic by Dr. Shirlee LatchMcLean and continued on Milrinone at 0.25 mcg. Her weight was 194lbs and she was felt volume overloaded at that time. Lasix, which was previous held due to AKI was then increased lasix to 80 mg daily and spironolactone discontinued. Patients dry weight 168 lbs.   Presented to ED 10/14 with increased SOB and hypoxia with ambulation. Pro-BNP 7005, creatinine 2.99 and BUN 42.   Lasix decreased from 120 mg IV TID to 120 BID on 10/16. Still getting metolazone. Weight reportedly down 10 lbs and hypokalemia now corrected. Breathing improved. Cr improved 2.98-> 2.21 yesterday, pending today.   Objective:   Weight Range:  Vital Signs:   Temp:  [97.6 F (36.4 C)-98.2 F (36.8 C)] 97.6 F (36.4 C) (10/18 0547) Pulse Rate:  [71-77] 77 (10/18 0547) Resp:  [18-20] 20 (10/18 0547) BP: (99-110)/(44-61) 110/61 mmHg (10/18 0547) SpO2:  [99 %-100 %] 100 % (10/18 0547) Weight:  [182 lb (82.555 kg)] 182 lb (82.555 kg) (10/18 0547) Last BM Date: 06/17/14  Weight change: Filed Weights   06/16/14 0521 06/17/14 0617 06/18/14 0547  Weight: 192 lb 3.2 oz (87.181 kg) 183 lb 1.6 oz (83.054 kg) 182 lb (82.555 kg)    Intake/Output:   Intake/Output Summary (Last 24 hours) at 06/18/14 0959 Last data filed at 06/18/14 0547  Gross  per 24 hour  Intake    680 ml  Output   2350 ml  Net  -1670 ml     Physical Exam: General: Chronically ill appearing. Sitting in bed HEENT: normal  Neck: supple. JVP to jaw, Carotids 2+ bilaterally; no bruits. No lymphadenopathy or thryomegaly appreciated.  Cor: PMI normal. Regular rate & rhythm. 2/6 SEM RUSB +s3 Lungs: diminished in the bases  Abdomen: soft, nontender, nondistended. No hepatosplenomegaly. No bruits or masses. Good bowel sounds.  Extremities: no cyanosis, clubbing, rash. 1-+ ankle edema, L>R  Neuro: alert & orientedx3, cranial nerves grossly intact. Moves all 4 extremities w/o difficulty. Affect pleasant.   Telemetry: atrial fib with V pacing   Labs: Basic Metabolic Panel:  Recent Labs Lab 06/14/14 1711 06/15/14 0705 06/16/14 0535 06/17/14 0615 06/17/14 1145  NA 141 141 140 140  --   K 5.0 3.9 3.6* 2.4* 4.0  CL 108 108 104 103  --   CO2 17* 19 21 24   --   GLUCOSE 112* 89 86 82  --   BUN 42* 43* 44* 37*  --   CREATININE 2.99* 2.95* 2.98* 2.21*  --   CALCIUM 9.2 9.0 9.0 7.6*  --   MG  --   --   --  1.4*  --    CBC:  Recent Labs Lab 06/14/14 1711  WBC 4.7  HGB 10.2*  HCT 29.8*  MCV 87.4  PLT 227   Cardiac Enzymes:  Recent Labs Lab 06/14/14 1930 06/15/14 0110 06/15/14 0705  TROPONINI <0.30 <0.30 <0.30    BNP: BNP (last 3 results)  Recent Labs  04/05/14 1312 04/09/14 0424 06/14/14 1711  PROBNP 5310.0* 3361.0* 7005.0*     Imaging: No results found.   Medications:     Scheduled Medications: . allopurinol  100 mg Oral Daily  . amiodarone  200 mg Oral BID  . atorvastatin  20 mg Oral q1800  . colchicine  0.3 mg Oral Daily  . ferrous sulfate  325 mg Oral Q breakfast  . furosemide  120 mg Intravenous BID  . hydrALAZINE  25 mg Oral 3 times per day  . Influenza vac split quadrivalent PF  0.5 mL Intramuscular Tomorrow-1000  . isosorbide mononitrate  30 mg Oral Daily  . metolazone  5 mg Oral Q12H  . potassium chloride  20 mEq  Oral BID  . sodium chloride  3 mL Intravenous Q12H  . warfarin  2.5 mg Oral Once per day on Sun Tue Wed Thu Sat  . warfarin  5 mg Oral Once per day on Mon Fri  . Warfarin - Pharmacist Dosing Inpatient   Does not apply q1800    Infusions: . milrinone 0.25 mcg/kg/min (06/17/14 2121)    PRN Medications: sodium chloride, sodium chloride, sodium chloride   Assessment:   1) A/C systolic HF 2) Acute hypoxic resp failure 3) NICM - EF 15-20% 4) Afib/flutter - persistent. S/p AV node ablation and CRT 5) AKI on CKD, stage 4 6) HTN 7) Deconditioning  8) Hypokalemia  Plan/Discussion:    Very difficult situation. Unfortunately [name] is suffering from end stage heart failure and has been on home milrinone and admitted with recurrent ADHF and a/c renal failure.  Much improved with IV lasix. Weight down 20 pounds, - 1 since yesterday, but negative 1.6 L. Breathing better but still tenuous. JVP up and prominent S3 on exam Will continue IV diuresis. K has been supped and corrected.   Will plan TEE/DC-CV on Monday to restore NSR and hopefully improve her hemodynamics though I suspect we are nearing the point where Palliative Care may be her best option.  I discussed at length with her and her husband and they agree that if she develops refractory sx would want comfort care.  Tobias Alexander H,MD 9:59 AM

## 2014-06-18 NOTE — Progress Notes (Addendum)
ANTICOAGULATION CONSULT NOTE - Follow Up Consult  Pharmacy Consult for Coumadin Indication: atrial fibrillation  Allergies  Allergen Reactions  . Tuberculin Tests Swelling    Patient Measurements: Height: 5\' 4"  (162.6 cm) Weight: 182 lb (82.555 kg) (scale b) IBW/kg (Calculated) : 54.7 Heparin Dosing Weight:   Vital Signs: Temp: 97.6 F (36.4 C) (10/18 0547) Temp Source: Oral (10/18 0547) BP: 94/57 mmHg (10/18 1033) Pulse Rate: 78 (10/18 1033)  Labs:  Recent Labs  06/16/14 0535 06/17/14 0615 06/18/14 0620 06/18/14 1140  LABPROT 30.3* 32.8* 31.4*  --   INR 2.87* 3.17* 3.00*  --   CREATININE 2.98* 2.21*  --  2.44*    Estimated Creatinine Clearance: 19.4 ml/min (by C-G formula based on Cr of 2.44).   Medications:  Scheduled:  . allopurinol  100 mg Oral Daily  . amiodarone  200 mg Oral BID  . atorvastatin  20 mg Oral q1800  . colchicine  0.3 mg Oral Daily  . ferrous sulfate  325 mg Oral Q breakfast  . furosemide  120 mg Intravenous BID  . hydrALAZINE  25 mg Oral 3 times per day  . isosorbide mononitrate  30 mg Oral Daily  . magnesium sulfate 1 - 4 g bolus IVPB  2 g Intravenous Once  . metolazone  5 mg Oral Q12H  . potassium chloride  20 mEq Oral BID  . potassium chloride  60 mEq Oral Once  . sodium chloride  3 mL Intravenous Q12H  . warfarin  2.5 mg Oral Once per day on Sun Tue Wed Thu Sat  . warfarin  5 mg Oral Once per day on Mon Fri  . Warfarin - Pharmacist Dosing Inpatient   Does not apply q1800    Assessment: 80yo female with AFib.  INR 3 this AM.  Pt on Amiodarone, same dose as at home. No bleeding noted. Will d/c home dose.  Goal of Therapy:  INR 2-3 Monitor platelets by anticoagulation protocol: Yes   Plan:  1-  Repeat Coumadin 2.5mg  2-  F/U in AM  Marisue Humble, PharmD Clinical Pharmacist [place] [facility]

## 2014-06-19 ENCOUNTER — Encounter (HOSPITAL_COMMUNITY)
Admission: EM | Disposition: A | Discharge status: Home / Self Care | Payer: Self-pay | Source: Home / Self Care | Attending: Internal Medicine

## 2014-06-19 ENCOUNTER — Encounter (HOSPITAL_COMMUNITY): Payer: Self-pay | Admitting: *Deleted

## 2014-06-19 ENCOUNTER — Inpatient Hospital Stay (HOSPITAL_COMMUNITY): Payer: Medicare HMO | Admitting: Anesthesiology

## 2014-06-19 ENCOUNTER — Encounter (HOSPITAL_COMMUNITY): Payer: Medicare HMO | Admitting: Anesthesiology

## 2014-06-19 DIAGNOSIS — I4892 Unspecified atrial flutter: Secondary | ICD-10-CM

## 2014-06-19 DIAGNOSIS — I341 Nonrheumatic mitral (valve) prolapse: Secondary | ICD-10-CM

## 2014-06-19 HISTORY — PX: CARDIOVERSION: SHX1299

## 2014-06-19 HISTORY — PX: TEE WITHOUT CARDIOVERSION: SHX5443

## 2014-06-19 LAB — BASIC METABOLIC PANEL
Anion gap: 14 (ref 5–15)
BUN: 45 mg/dL — AB (ref 6–23)
CHLORIDE: 97 meq/L (ref 96–112)
CO2: 29 meq/L (ref 19–32)
CREATININE: 2.63 mg/dL — AB (ref 0.50–1.10)
Calcium: 8.9 mg/dL (ref 8.4–10.5)
GFR calc Af Amer: 19 mL/min — ABNORMAL LOW (ref >90)
GFR calc non Af Amer: 16 mL/min — ABNORMAL LOW (ref >90)
Glucose, Bld: 95 mg/dL (ref 70–99)
Potassium: 3.1 mEq/L — ABNORMAL LOW (ref 3.7–5.3)
Sodium: 140 mEq/L (ref 137–147)

## 2014-06-19 LAB — CARBOXYHEMOGLOBIN
Carboxyhemoglobin: 1.5 % (ref 0.5–1.5)
Methemoglobin: 0.8 % (ref 0.0–1.5)
O2 Saturation: 67.6 %
Total hemoglobin: 10.4 g/dL — ABNORMAL LOW (ref 12.0–16.0)

## 2014-06-19 LAB — PROTIME-INR
INR: 2.83 — AB (ref 0.00–1.49)
Prothrombin Time: 30 seconds — ABNORMAL HIGH (ref 11.6–15.2)

## 2014-06-19 SURGERY — ECHOCARDIOGRAM, TRANSESOPHAGEAL
Anesthesia: Monitor Anesthesia Care

## 2014-06-19 MED ORDER — ONDANSETRON HCL 4 MG/2ML IJ SOLN: 4.0000 mg | Freq: Once | INTRAMUSCULAR | Status: DC | PRN

## 2014-06-19 MED ORDER — POTASSIUM CHLORIDE ER 10 MEQ PO TBCR
20.0000 meq | EXTENDED_RELEASE_TABLET | Freq: Two times a day (BID) | ORAL | Status: DC
  Administered 2014-06-20 (×2): 20 meq via ORAL
  Filled 2014-06-19 (×4): qty 2

## 2014-06-19 MED ORDER — POTASSIUM CHLORIDE CRYS ER 20 MEQ PO TBCR
40.0000 meq | EXTENDED_RELEASE_TABLET | Freq: Once | ORAL | Status: AC
  Administered 2014-06-19: 40 meq via ORAL
  Filled 2014-06-19: qty 2

## 2014-06-19 MED ORDER — WARFARIN SODIUM 5 MG PO TABS
5.0000 mg | ORAL_TABLET | ORAL | Status: DC
  Administered 2014-06-19: 5 mg via ORAL
  Filled 2014-06-19: qty 1

## 2014-06-19 MED ORDER — FUROSEMIDE 40 MG PO TABS
40.0000 mg | ORAL_TABLET | Freq: Every day | ORAL | Status: DC
  Administered 2014-06-20 – 2014-06-21 (×2): 40 mg via ORAL
  Filled 2014-06-19 (×3): qty 1

## 2014-06-19 MED ORDER — PROPOFOL 10 MG/ML IV BOLUS
INTRAVENOUS | Status: DC | PRN
  Administered 2014-06-19 (×4): 10 mg via INTRAVENOUS

## 2014-06-19 MED ORDER — SODIUM CHLORIDE 0.9 % IV SOLN: INTRAVENOUS | Status: DC

## 2014-06-19 MED ORDER — BUTAMBEN-TETRACAINE-BENZOCAINE 2-2-14 % EX AERO
INHALATION_SPRAY | CUTANEOUS | Status: DC | PRN
  Administered 2014-06-19: 2 via TOPICAL

## 2014-06-19 MED ORDER — OXYCODONE HCL 5 MG/5ML PO SOLN: 5.0000 mg | Freq: Once | ORAL | Status: DC | PRN

## 2014-06-19 MED ORDER — FENTANYL CITRATE 0.05 MG/ML IJ SOLN: 25.0000 ug | INTRAMUSCULAR | Status: DC | PRN

## 2014-06-19 MED ORDER — OXYCODONE HCL 5 MG PO TABS: 5.0000 mg | ORAL_TABLET | Freq: Once | ORAL | Status: DC | PRN

## 2014-06-19 MED ORDER — PROPOFOL INFUSION 10 MG/ML OPTIME
INTRAVENOUS | Status: DC | PRN
  Administered 2014-06-19: 25 ug/kg/min via INTRAVENOUS

## 2014-06-19 MED ORDER — FUROSEMIDE 80 MG PO TABS
80.0000 mg | ORAL_TABLET | Freq: Every day | ORAL | Status: DC
  Administered 2014-06-20 – 2014-06-22 (×3): 80 mg via ORAL
  Filled 2014-06-19 (×4): qty 1

## 2014-06-19 MED ORDER — WARFARIN SODIUM 2.5 MG PO TABS
2.5000 mg | ORAL_TABLET | ORAL | Status: DC
  Administered 2014-06-20 – 2014-06-21 (×2): 2.5 mg via ORAL
  Filled 2014-06-19 (×3): qty 1

## 2014-06-19 MED ORDER — MENTHOL 3 MG MT LOZG
1.0000 | LOZENGE | OROMUCOSAL | Status: DC | PRN
Start: 2014-06-19 — End: 2014-06-22
  Filled 2014-06-19: qty 9

## 2014-06-19 NOTE — Progress Notes (Signed)
ANTICOAGULATION CONSULT NOTE - Follow Up Consult  Pharmacy Consult for Coumadin Indication: atrial fibrillation  Allergies  Allergen Reactions  . Tuberculin Tests Swelling    Patient Measurements: Height: 5\' 4"  (162.6 cm) Weight: 179 lb 3.2 oz (81.285 kg) (b scale) IBW/kg (Calculated) : 54.7 Heparin Dosing Weight:   Vital Signs: Temp: 97.4 F (36.3 C) (10/19 1107) Temp Source: Oral (10/19 0913) BP: 113/60 mmHg (10/19 1300) Pulse Rate: 79 (10/19 1300)  Labs:  Recent Labs  06/17/14 0615 06/18/14 0620 06/18/14 1140 06/19/14 0507  LABPROT 32.8* 31.4*  --  30.0*  INR 3.17* 3.00*  --  2.83*  CREATININE 2.21*  --  2.44* 2.63*    Estimated Creatinine Clearance: 17.9 ml/min (by C-G formula based on Cr of 2.63).   Medications:  Scheduled:  . allopurinol  100 mg Oral Daily  . amiodarone  200 mg Oral BID  . atorvastatin  20 mg Oral q1800  . colchicine  0.3 mg Oral Daily  . ferrous sulfate  325 mg Oral Q breakfast  . [START ON 06/20/2014] furosemide  40 mg Oral q1800  . [START ON 06/20/2014] furosemide  80 mg Oral Q breakfast  . hydrALAZINE  25 mg Oral 3 times per day  . isosorbide mononitrate  30 mg Oral Daily  . [START ON 06/20/2014] potassium chloride  20 mEq Oral BID  . sodium chloride  3 mL Intravenous Q12H  . [START ON 06/20/2014] warfarin  2.5 mg Oral Once per day on Sun Tue Wed Thu Sat  . warfarin  5 mg Oral Once per day on Mon Fri  . Warfarin - Pharmacist Dosing Inpatient   Does not apply q1800    Assessment: 80yo female with AFib s/p DCCV > SR 10/19.  INR 2.8 this AM.  Pt on Amiodarone, same dose as at home. No bleeding noted. Will  Restart home dose  Goal of Therapy:  INR 2-3 Monitor platelets by anticoagulation protocol: Yes   Plan:  1- Coumadin 2.5mg  daily except 5mg  MF 2-  INR q AM  Leota Sauers Pharm.D. CPP, BCPS Clinical Pharmacist [phone] 06/19/2014 3:30 PM

## 2014-06-19 NOTE — Interval H&P Note (Signed)
History and Physical Interval Note:  06/19/2014 9:43 AM  [name]  has presented today for surgery, with the diagnosis of AFIB  The various methods of treatment have been discussed with the patient and family. After consideration of risks, benefits and other options for treatment, the patient has consented to  Procedure(s): TRANSESOPHAGEAL ECHOCARDIOGRAM (TEE) (N/A) CARDIOVERSION (N/A) as a surgical intervention .  The patient's history has been reviewed, patient examined, no change in status, stable for surgery.  I have reviewed the patient's chart and labs.  Questions were answered to the patient's satisfaction.     SKAINS, MARK

## 2014-06-19 NOTE — Plan of Care (Signed)
Problem: Discharge Progression Outcomes Goal: Able to perform self care activities Outcome: Progressing Able to stand up OOB with minimal help to commode

## 2014-06-19 NOTE — Progress Notes (Signed)
  Echocardiogram Echocardiogram Transesophageal has been performed.  [name] 06/19/2014, 10:52 AM

## 2014-06-19 NOTE — Care Management Note (Signed)
    Page 1 of 1   06/19/2014     3:36:46 PM CARE MANAGEMENT NOTE 06/19/2014  Patient:  [name], [name]   Account Number:  [id number]  Date Initiated:  06/19/2014  Documentation initiated by:  [facility]  Subjective/Objective Assessment:   80 y.o. female with a history of NICM s/p BiV ICD, HTN, chronic systolic HF, permanent afib (s/p AVN ablation 2010 and again repeat 2015), CVA, DM2 and CKD stage IV who presents to [facility] today with SOB.//Home with spouse.     Action/Plan:   IV diuresis. TEE/DC-CV.//Access for   Anticipated DC Date:  06/20/2014   Anticipated DC Plan:  HOME W HOME HEALTH SERVICES      DC Planning Services  CM consult  HF Clinic      [facility] Choice  Resumption Of Svcs/PTA Provider   Choice offered to / List presented to:  C-1 Patient        HH arranged  HH-1 RN  HH-10 DISEASE MANAGEMENT      HH agency  [facility]   Status of service:   Medicare Important Message given?  YES (If response is "NO", the following Medicare IM given date fields will be blank) Date Medicare IM given:  06/19/2014 Medicare IM given by:  Trenton Founds Date Additional Medicare IM given:   Additional Medicare IM given by:    Discharge Disposition:    Per UR Regulation:  Reviewed for med. necessity/level of care/duration of stay  If discussed at Long Length of Stay Meetings, dates discussed:    Comments:  06/19/14 1100 Brandi Prew, RN, BSN, [place] [phone] Pt currently active with [facility] for RN services.  Resumption of care requested.  Wynelle Bourgeois, RN of [facility] notified.  No DME needs identified at this time.

## 2014-06-19 NOTE — H&P (View-Only) (Signed)
   Subjective:    [name] is a 80 y.o. female with a history of NICM s/p BiV ICD, HTN, chronic systolic HF, permanent afib (s/p AVN ablation 2010 and again repeat 2015), CVA, DM2 and CKD stage IV who presents to [facility] today with SOB.   ECHO (04/2014): EF 15-20%, grade III DD, severe MR, biatrial enlargement, mod TR.   Admitted 04/2014 with increased SOB and fatigue. ICD interrogated and showed 1/3 of ventricular beats actually conducted through AV node limiting BiV pacing and underwent repeat AV node ablation. Had RHC showing low output and started on milrinone. Failed milrinone wean. Loaded with amiodarone for potential DCCV. She was seen on 06/07/14 in CHF clinic by Dr. McLean and continued on Milrinone at 0.25 mcg. Her weight was 194lbs and she was felt volume overloaded at that time. Lasix, which was previous held due to AKI was then increased lasix to 80 mg daily and spironolactone discontinued. Patients dry weight 168 lbs.   Presented to ED 10/14 with increased SOB and hypoxia with ambulation. Pro-BNP 7005, creatinine 2.99 and BUN 42.   Lasix decreased from 120 mg IV TID to 120 BID on 10/16. Still getting metolazone. Weight reportedly down 10 lbs and hypokalemia now corrected. Breathing improved. Cr improved 2.98-> 2.21 yesterday, pending today.   Objective:   Weight Range:  Vital Signs:   Temp:  [97.6 F (36.4 C)-98.2 F (36.8 C)] 97.6 F (36.4 C) (10/18 0547) Pulse Rate:  [71-77] 77 (10/18 0547) Resp:  [18-20] 20 (10/18 0547) BP: (99-110)/(44-61) 110/61 mmHg (10/18 0547) SpO2:  [99 %-100 %] 100 % (10/18 0547) Weight:  [182 lb (82.555 kg)] 182 lb (82.555 kg) (10/18 0547) Last BM Date: 06/17/14  Weight change: Filed Weights   06/16/14 0521 06/17/14 0617 06/18/14 0547  Weight: 192 lb 3.2 oz (87.181 kg) 183 lb 1.6 oz (83.054 kg) 182 lb (82.555 kg)    Intake/Output:   Intake/Output Summary (Last 24 hours) at 06/18/14 0959 Last data filed at 06/18/14 0547  Gross  per 24 hour  Intake    680 ml  Output   2350 ml  Net  -1670 ml     Physical Exam: General: Chronically ill appearing. Sitting in bed HEENT: normal  Neck: supple. JVP to jaw, Carotids 2+ bilaterally; no bruits. No lymphadenopathy or thryomegaly appreciated.  Cor: PMI normal. Regular rate & rhythm. 2/6 SEM RUSB +s3 Lungs: diminished in the bases  Abdomen: soft, nontender, nondistended. No hepatosplenomegaly. No bruits or masses. Good bowel sounds.  Extremities: no cyanosis, clubbing, rash. 1-+ ankle edema, L>R  Neuro: alert & orientedx3, cranial nerves grossly intact. Moves all 4 extremities w/o difficulty. Affect pleasant.   Telemetry: atrial fib with V pacing   Labs: Basic Metabolic Panel:  Recent Labs Lab 06/14/14 1711 06/15/14 0705 06/16/14 0535 06/17/14 0615 06/17/14 1145  NA 141 141 140 140  --   K 5.0 3.9 3.6* 2.4* 4.0  CL 108 108 104 103  --   CO2 17* 19 21 24  --   GLUCOSE 112* 89 86 82  --   BUN 42* 43* 44* 37*  --   CREATININE 2.99* 2.95* 2.98* 2.21*  --   CALCIUM 9.2 9.0 9.0 7.6*  --   MG  --   --   --  1.4*  --    CBC:  Recent Labs Lab 06/14/14 1711  WBC 4.7  HGB 10.2*  HCT 29.8*  MCV 87.4  PLT 227   Cardiac Enzymes:    Recent Labs Lab 06/14/14 1930 06/15/14 0110 06/15/14 0705  TROPONINI <0.30 <0.30 <0.30    BNP: BNP (last 3 results)  Recent Labs  04/05/14 1312 04/09/14 0424 06/14/14 1711  PROBNP 5310.0* 3361.0* 7005.0*     Imaging: No results found.   Medications:     Scheduled Medications: . allopurinol  100 mg Oral Daily  . amiodarone  200 mg Oral BID  . atorvastatin  20 mg Oral q1800  . colchicine  0.3 mg Oral Daily  . ferrous sulfate  325 mg Oral Q breakfast  . furosemide  120 mg Intravenous BID  . hydrALAZINE  25 mg Oral 3 times per day  . Influenza vac split quadrivalent PF  0.5 mL Intramuscular Tomorrow-1000  . isosorbide mononitrate  30 mg Oral Daily  . metolazone  5 mg Oral Q12H  . potassium chloride  20 mEq  Oral BID  . sodium chloride  3 mL Intravenous Q12H  . warfarin  2.5 mg Oral Once per day on Sun Tue Wed Thu Sat  . warfarin  5 mg Oral Once per day on Mon Fri  . Warfarin - Pharmacist Dosing Inpatient   Does not apply q1800    Infusions: . milrinone 0.25 mcg/kg/min (06/17/14 2121)    PRN Medications: sodium chloride, sodium chloride, sodium chloride   Assessment:   1) A/C systolic HF 2) Acute hypoxic resp failure 3) NICM - EF 15-20% 4) Afib/flutter - persistent. S/p AV node ablation and CRT 5) AKI on CKD, stage 4 6) HTN 7) Deconditioning  8) Hypokalemia  Plan/Discussion:    Very difficult situation. Unfortunately [name] is suffering from end stage heart failure and has been on home milrinone and admitted with recurrent ADHF and a/c renal failure.  Much improved with IV lasix. Weight down 20 pounds, - 1 since yesterday, but negative 1.6 L. Breathing better but still tenuous. JVP up and prominent S3 on exam Will continue IV diuresis. K has been supped and corrected.   Will plan TEE/DC-CV on Monday to restore NSR and hopefully improve her hemodynamics though I suspect we are nearing the point where Palliative Care may be her best option.  I discussed at length with her and her husband and they agree that if she develops refractory sx would want comfort care.  Tobias Alexander H,MD 9:59 AM

## 2014-06-19 NOTE — Progress Notes (Signed)
Advanced Heart Failure Rounding Note   Subjective:    [name] is a 80 y.o. female with a history of NICM s/p BiV ICD, HTN, chronic systolic HF, permanent afib (s/p AVN ablation 2010 and again repeat 2015), CVA, DM2 and CKD stage IV who presents to [facility] today with SOB.   ECHO (04/2014): EF 15-20%, grade III DD, severe MR, biatrial enlargement, mod TR.   Admitted 04/2014 with increased SOB and fatigue. ICD interrogated and showed 1/3 of ventricular beats actually conducted through AV node limiting BiV pacing and underwent repeat AV node ablation. Had RHC showing low output and started on milrinone. Failed milrinone wean. Loaded with amiodarone for potential DCCV. She was seen on 06/07/14 in CHF clinic by Dr. Shirlee Latch and continued on Milrinone at 0.25 mcg. Her weight was 194lbs and she was felt volume overloaded at that time. Lasix, which was previous held due to AKI was then increased lasix to 80 mg daily and spironolactone discontinued. Patients dry weight 168 lbs.   Presented to ED 10/14 with increased SOB and hypoxia with ambulation. Pro-BNP 7005, creatinine 2.99 and BUN 42.  Remains on her home milrinone. Has been diuresing well with IV lasix and metolazone. Weight down 22 pounds. Creatinine starting to climb again. Plan for TEE and DC-CV today.    Objective:   Weight Range:  Vital Signs:   Temp:  [97.8 F (36.6 C)-98.7 F (37.1 C)] 97.9 F (36.6 C) (10/19 0913) Pulse Rate:  [71-78] 78 (10/19 0514) Resp:  [11-18] 11 (10/19 0913) BP: (91-126)/(54-64) 126/63 mmHg (10/19 0914) SpO2:  [99 %-100 %] 100 % (10/19 0913) Weight:  [81.285 kg (179 lb 3.2 oz)] 81.285 kg (179 lb 3.2 oz) (10/19 0518) Last BM Date: 06/18/14  Weight change: Filed Weights   06/17/14 0617 06/18/14 0547 06/19/14 0518  Weight: 83.054 kg (183 lb 1.6 oz) 82.555 kg (182 lb) 81.285 kg (179 lb 3.2 oz)    Intake/Output:   Intake/Output Summary (Last 24 hours) at 06/19/14 1044 Last data filed at 06/19/14 0927  Gross per 24 hour  Intake 1706.38 ml  Output   3750 ml  Net -2043.62 ml     Physical Exam: General: Chronically ill appearing. Sitting in bed HEENT: normal  Neck: supple. JVP 8-9, Carotids 2+ bilaterally; no bruits. No lymphadenopathy or thryomegaly appreciated.  Cor: PMI normal. Regular rate & rhythm. 2/6 SEM RUSB +s3 Lungs: diminished in the bases  Abdomen: soft, nontender, nondistended. No hepatosplenomegaly. No bruits or masses. Good bowel sounds.  Extremities: no cyanosis, clubbing, rash. Tr ankle edema  Neuro: alert & orientedx3, cranial nerves grossly intact. Moves all 4 extremities w/o difficulty. Affect pleasant.   Telemetry: atrial fib with V pacing   Labs: Basic Metabolic Panel:  Recent Labs Lab 06/15/14 0705 06/16/14 0535 06/17/14 0615 06/17/14 1145 06/18/14 1140 06/19/14 0507  NA 141 140 140  --  138 140  K 3.9 3.6* 2.4* 4.0 3.1* 3.1*  CL 108 104 103  --  97 97  CO2 19 21 24   --  27 29  GLUCOSE 89 86 82  --  97 95  BUN 43* 44* 37*  --  40* 45*  CREATININE 2.95* 2.98* 2.21*  --  2.44* 2.63*  CALCIUM 9.0 9.0 7.6*  --  8.4 8.9  MG  --   --  1.4*  --  1.6  --     Liver Function Tests: No results found for this basename: AST, ALT, ALKPHOS, BILITOT, PROT, ALBUMIN,  in  the last 168 hours No results found for this basename: LIPASE, AMYLASE,  in the last 168 hours No results found for this basename: AMMONIA,  in the last 168 hours  CBC:  Recent Labs Lab 06/14/14 1711  WBC 4.7  HGB 10.2*  HCT 29.8*  MCV 87.4  PLT 227    Cardiac Enzymes:  Recent Labs Lab 06/14/14 1930 06/15/14 0110 06/15/14 0705  TROPONINI <0.30 <0.30 <0.30    BNP: BNP (last 3 results)  Recent Labs  04/05/14 1312 04/09/14 0424 06/14/14 1711  PROBNP 5310.0* 3361.0* 7005.0*       Imaging: No results found.   Medications:     Scheduled Medications: . [facility][MAR HOLD] allopurinol  100 mg Oral Daily  . [facility][MAR HOLD] amiodarone  200 mg Oral BID  . [facility][MAR HOLD] atorvastatin   20 mg Oral q1800  . [facility][MAR HOLD] colchicine  0.3 mg Oral Daily  . [facility][MAR HOLD] ferrous sulfate  325 mg Oral Q breakfast  . [MAR HOLD] furosemide  120 mg Intravenous BID  . [facility][MAR HOLD] hydrALAZINE  25 mg Oral 3 times per day  . [MAR HOLD] isosorbide mononitrate  30 mg Oral Daily  . [MAR HOLD] metolazone  5 mg Oral Q12H  . [facility][MAR HOLD] potassium chloride  20 mEq Oral BID  . [MAR HOLD] sodium chloride  3 mL Intravenous Q12H  . [facility][MAR HOLD] Warfarin - Pharmacist Dosing Inpatient   Does not apply q1800    Infusions: . milrinone 0.25 mcg/kg/min (06/19/14 1017)    PRN Medications: [MAR HOLD] sodium chloride, [MAR HOLD] benzonatate, butamben-tetracaine-benzocaine, [MAR HOLD] sodium chloride, [MAR HOLD] sodium chloride   Assessment:   1) A/C systolic HF 2) Acute hypoxic resp failure 3) NICM - EF 15-20% 4) Afib/flutter - persistent. S/p AV node ablation and CRT 5) AKI on CKD, stage 4 6) HTN 7) Deconditioning  8) Hypokalemia  Plan/Discussion:    Very difficult situation. Unfortunately Mrs. Parr is suffering from end stage heart failure and has been on home milrinone and admitted with recurrent ADHF and a/c renal failure.  Much improved with IV lasix. Weight down 22 pounds. Breathing better but still tenuous. Creatinine starting to get worse. Will hold lasix today and switch to po tomorrow. Supp K+   Plan TEE/DC-CV today  to restore NSR and hopefully improve her hemodynamics though I suspect we are nearing the point where Palliative Care may be her best option.  Over the weekend,  I discussed at length with her and her husband and they agree that if she develops refractory sx would want comfort care.  Daniel Bensimhon,MD 10:44 AM

## 2014-06-19 NOTE — Transfer of Care (Signed)
Immediate Anesthesia Transfer of Care Note  Patient: [name]  Procedure(s) Performed: Procedure(s): TRANSESOPHAGEAL ECHOCARDIOGRAM (TEE) (N/A) CARDIOVERSION (N/A)  Patient Location: Endoscopy Unit  Anesthesia Type:MAC  Level of Consciousness: awake and alert   Airway & Oxygen Therapy: Patient Spontanous Breathing and Patient connected to nasal cannula oxygen  Post-op Assessment: Report given to PACU RN, Post -op Vital signs reviewed and stable and Patient moving all extremities  Post vital signs: Reviewed and stable  Complications: No apparent anesthesia complications

## 2014-06-19 NOTE — Anesthesia Postprocedure Evaluation (Signed)
  Anesthesia Post-op Note  Patient: [name]  Procedure(s) Performed: Procedure(s): TRANSESOPHAGEAL ECHOCARDIOGRAM (TEE) (N/A) CARDIOVERSION (N/A)  Patient Location: PACU  Anesthesia Type:MAC  Level of Consciousness: awake, alert  and oriented  Airway and Oxygen Therapy: Patient Spontanous Breathing  Post-op Pain: none  Post-op Assessment: Post-op Vital signs reviewed  Post-op Vital Signs: Reviewed  Last Vitals:  Filed Vitals:   06/19/14 1135  BP: 108/57  Pulse: 74  Temp:   Resp: 18    Complications: No apparent anesthesia complications

## 2014-06-19 NOTE — CV Procedure (Signed)
    Electrical Cardioversion Procedure Note [name] [id number] August 18, 1933  Procedure: Electrical Cardioversion Indications:  Atrial Flutter  Time Out: Verified patient identification, verified procedure,medications/allergies/relevent history reviewed, required imaging and test results available.  Performed  Procedure Details  The patient was NPO after midnight. Anesthesia was administered at the beside  by Dr. Sampson Goon with propofol.  Cardioversion was performed with synchronized biphasic defibrillation via AP pads with 150, 200 joules.  2 attempt(s) were performed.  The patient converted to normal sinus rhythm. The patient tolerated the procedure well   IMPRESSION:  Successful cardioversion of atrial fibrillation/flutter. Pacemaker interrogation demonstrates normal rhythm with PACs. EF 15-20% Mild AI Moderate MR Severe TR No LAA thrombus      [name] 06/19/2014, 10:56 AM

## 2014-06-19 NOTE — Progress Notes (Signed)
PT Cancellation Note  Patient Details Name: [name] MRN: [id number] DOB: Mar 05, 1933   Cancelled Treatment:    Reason Eval/Treat Not Completed: Patient at procedure or test/unavailable. Pt being wheeled off of floor for procedure.   Lateisha Thurlow LUBECK 06/19/2014, 9:38 AM

## 2014-06-19 NOTE — Anesthesia Preprocedure Evaluation (Addendum)
Anesthesia Evaluation  Patient identified by MRN, date of birth, ID band Patient awake    Reviewed: Allergy & Precautions, H&P , NPO status , Patient's Chart, lab work & pertinent test results  Airway Mallampati: IV TM Distance: >3 FB Neck ROM: Limited    Dental  (+) Partial Lower   Pulmonary neg pulmonary ROS,  breath sounds clear to auscultation        Cardiovascular hypertension, + CAD, + Peripheral Vascular Disease and +CHF (EF 15-20% s/p BiV ICD) + dysrhythmias + Cardiac Defibrillator Rhythm:Regular Rate:Normal     Neuro/Psych CVA negative psych ROS   GI/Hepatic negative GI ROS, Neg liver ROS,   Endo/Other  negative endocrine ROSMorbid obesity  Renal/GU CRFRenal disease     Musculoskeletal  (+) Arthritis -,   Abdominal   Peds  Hematology  (+) anemia ,   Anesthesia Other Findings   Reproductive/Obstetrics                          Anesthesia Physical Anesthesia Plan  ASA: IV  Anesthesia Plan: MAC   Post-op Pain Management:    Induction: Intravenous  Airway Management Planned: Simple Face Mask  Additional Equipment:   Intra-op Plan:   Post-operative Plan:   Informed Consent: I have reviewed the patients History and Physical, chart, labs and discussed the procedure including the risks, benefits and alternatives for the proposed anesthesia with the patient or authorized representative who has indicated his/her understanding and acceptance.   Dental advisory given  Plan Discussed with: CRNA and Surgeon  Anesthesia Plan Comments:         Anesthesia Quick Evaluation

## 2014-06-20 ENCOUNTER — Other Ambulatory Visit: Payer: Self-pay

## 2014-06-20 ENCOUNTER — Encounter (HOSPITAL_COMMUNITY): Payer: Self-pay | Admitting: Cardiology

## 2014-06-20 LAB — CARBOXYHEMOGLOBIN
Carboxyhemoglobin: 1.4 % (ref 0.5–1.5)
METHEMOGLOBIN: 0.8 % (ref 0.0–1.5)
O2 Saturation: 58.5 %
Total hemoglobin: 17.4 g/dL — ABNORMAL HIGH (ref 12.0–16.0)

## 2014-06-20 LAB — BASIC METABOLIC PANEL
Anion gap: 13 (ref 5–15)
BUN: 40 mg/dL — ABNORMAL HIGH (ref 6–23)
CALCIUM: 8.8 mg/dL (ref 8.4–10.5)
CO2: 29 mEq/L (ref 19–32)
CREATININE: 2.29 mg/dL — AB (ref 0.50–1.10)
Chloride: 96 mEq/L (ref 96–112)
GFR calc Af Amer: 22 mL/min — ABNORMAL LOW (ref >90)
GFR calc non Af Amer: 19 mL/min — ABNORMAL LOW (ref >90)
GLUCOSE: 91 mg/dL (ref 70–99)
Potassium: 3.4 mEq/L — ABNORMAL LOW (ref 3.7–5.3)
Sodium: 138 mEq/L (ref 137–147)

## 2014-06-20 LAB — PROTIME-INR
INR: 2.66 — AB (ref 0.00–1.49)
Prothrombin Time: 28.6 seconds — ABNORMAL HIGH (ref 11.6–15.2)

## 2014-06-20 MED ORDER — HYDRALAZINE HCL 25 MG PO TABS
37.5000 mg | ORAL_TABLET | Freq: Three times a day (TID) | ORAL | Status: DC
  Administered 2014-06-20 – 2014-06-22 (×4): 37.5 mg via ORAL
  Filled 2014-06-20 (×9): qty 1.5

## 2014-06-20 MED ORDER — POTASSIUM CHLORIDE CRYS ER 20 MEQ PO TBCR
40.0000 meq | EXTENDED_RELEASE_TABLET | Freq: Once | ORAL | Status: AC
  Administered 2014-06-22: 40 meq via ORAL

## 2014-06-20 NOTE — ED Provider Notes (Signed)
Medical screening examination/treatment/procedure(s) were performed by non-physician practitioner and as supervising physician I was immediately available for consultation/collaboration.   EKG Interpretation   Date/Time:  Wednesday June 14 2014 17:12:02 EDT Ventricular Rate:  73 PR Interval:    QRS Duration: 192 QT Interval:  530 QTC Calculation: 583 R Axis:   145 Text Interpretation:  Ventricular-paced rhythm Abnormal ECG Confirmed by  Bebe Shaggy  MD, Tayjah Lobdell ([id number]) on 06/14/2014 5:16:32 PM        Joya Gaskins, MD 06/20/14 [phone]

## 2014-06-20 NOTE — Progress Notes (Signed)
ANTICOAGULATION CONSULT NOTE - Follow Up Consult  Pharmacy Consult for Coumadin Indication: atrial fibrillation  Allergies  Allergen Reactions  . Tuberculin Tests Swelling    Patient Measurements: Height: 5\' 4"  (162.6 cm) Weight: 179 lb 3.7 oz (81.3 kg) IBW/kg (Calculated) : 54.7  Vital Signs: Temp: 98.1 F (36.7 C) (10/20 0642) Temp Source: Oral (10/20 2800) BP: 102/61 mmHg (10/20 0955) Pulse Rate: 84 (10/20 0955)  Labs:  Recent Labs  06/18/14 0620 06/18/14 1140 06/19/14 0507 06/20/14 0513  LABPROT 31.4*  --  30.0* 28.6*  INR 3.00*  --  2.83* 2.66*  CREATININE  --  2.44* 2.63* 2.29*    Estimated Creatinine Clearance: 20.5 ml/min (by C-G formula based on Cr of 2.29).  Assessment: 81yof continues on coumadin for afib. She is s/p DCCV yesterday and is maintaining NSR. Also continues on amiodarone 200mg  bid as pta. INR is therapeutic on home regimen 2.5mg  daily except 5mg  Mon/Fri. No bleeding reported.  Goal of Therapy:  INR 2-3 Monitor platelets by anticoagulation protocol: Yes   Plan:  1) Continue home regimen - will get 2.5mg  tonight 2) INR in AM  Fredrik Rigger 06/20/2014,10:19 AM

## 2014-06-20 NOTE — Progress Notes (Signed)
Advanced Heart Failure Rounding Note   Subjective:    [name] is a 80 y.o. female with a history of NICM s/p BiV ICD, HTN, chronic systolic HF, permanent afib (s/p AVN ablation 2010 and again repeat 2015), CVA, DM2 and CKD stage IV who presents to [facility] today with SOB.   ECHO (04/2014): EF 15-20%, grade III DD, severe MR, biatrial enlargement, mod TR.   Admitted 04/2014 with increased SOB and fatigue. ICD interrogated and showed 1/3 of ventricular beats actually conducted through AV node limiting BiV pacing and underwent repeat AV node ablation. Had RHC showing low output and started on milrinone. Failed milrinone wean. Loaded with amiodarone for potential DCCV. She was seen on 06/07/14 in CHF clinic by Dr. Shirlee Latch and continued on Milrinone at 0.25 mcg. Her weight was 194lbs and she was felt volume overloaded at that time. Lasix, which was previous held due to AKI was then increased lasix to 80 mg daily and spironolactone discontinued. Patients dry weight 168 lbs.   Presented to ED 10/14 with increased SOB and hypoxia with ambulation. Pro-BNP 7005, creatinine 2.99 and BUN 42.  Remains on her home milrinone.   Underwent TEE/DC-CV 10/19. Holding NSR. Feels better. Back on oral diuretics. Weight and renal function improving. Denies dyspnea/orthopnea. Throat is sore from TEE. No bleeding. Weight down 22 pounds total.    Objective:   Weight Range:  Vital Signs:   Temp:  [97.4 F (36.3 C)-98.1 F (36.7 C)] 98.1 F (36.7 C) (10/20 0228) Pulse Rate:  [72-96] 87 (10/20 0228) Resp:  [10-20] 17 (10/20 0228) BP: (108-132)/(53-75) 115/55 mmHg (10/20 0552) SpO2:  [96 %-100 %] 99 % (10/20 0228) Last BM Date: 06/18/14  Weight change: Filed Weights   06/17/14 0617 06/18/14 0547 06/19/14 0518  Weight: 83.054 kg (183 lb 1.6 oz) 82.555 kg (182 lb) 81.285 kg (179 lb 3.2 oz)    Intake/Output:   Intake/Output Summary (Last 24 hours) at 06/20/14 3729 Last data filed at 06/19/14 2348  Gross  per 24 hour  Intake    483 ml  Output   1600 ml  Net  -1117 ml     Physical Exam: General: Elderly. Lying in bed. NAD HEENT: normal  Neck: supple. JVP 10 Carotids 2+ bilaterally; no bruits. No lymphadenopathy or thryomegaly appreciated.  Cor: PMI normal. Regular rate & rhythm. 2/6 SEM RUSB +s3 Lungs: diminished in the bases  Abdomen: soft, nontender, nondistended. No hepatosplenomegaly. No bruits or masses. Good bowel sounds.  Extremities: no cyanosis, clubbing, rash. Tr ankle edema  Neuro: alert & orientedx3, cranial nerves grossly intact. Moves all 4 extremities w/o difficulty. Affect pleasant.   Telemetry: NSR with V pacing   Labs: Basic Metabolic Panel:  Recent Labs Lab 06/16/14 0535 06/17/14 0615 06/17/14 1145 06/18/14 1140 06/19/14 0507 06/20/14 0513  NA 140 140  --  138 140 138  K 3.6* 2.4* 4.0 3.1* 3.1* 3.4*  CL 104 103  --  97 97 96  CO2 21 24  --  27 29 29   GLUCOSE 86 82  --  97 95 91  BUN 44* 37*  --  40* 45* 40*  CREATININE 2.98* 2.21*  --  2.44* 2.63* 2.29*  CALCIUM 9.0 7.6*  --  8.4 8.9 8.8  MG  --  1.4*  --  1.6  --   --     Liver Function Tests: No results found for this basename: AST, ALT, ALKPHOS, BILITOT, PROT, ALBUMIN,  in the last 168 hours No  results found for this basename: LIPASE, AMYLASE,  in the last 168 hours No results found for this basename: AMMONIA,  in the last 168 hours  CBC:  Recent Labs Lab 06/14/14 1711  WBC 4.7  HGB 10.2*  HCT 29.8*  MCV 87.4  PLT 227    Cardiac Enzymes:  Recent Labs Lab 06/14/14 1930 06/15/14 0110 06/15/14 0705  TROPONINI <0.30 <0.30 <0.30    BNP: BNP (last 3 results)  Recent Labs  04/05/14 1312 04/09/14 0424 06/14/14 1711  PROBNP 5310.0* 3361.0* 7005.0*       Imaging: No results found.   Medications:     Scheduled Medications: . allopurinol  100 mg Oral Daily  . amiodarone  200 mg Oral BID  . atorvastatin  20 mg Oral q1800  . colchicine  0.3 mg Oral Daily  . ferrous  sulfate  325 mg Oral Q breakfast  . furosemide  40 mg Oral q1800  . furosemide  80 mg Oral Q breakfast  . hydrALAZINE  25 mg Oral 3 times per day  . isosorbide mononitrate  30 mg Oral Daily  . potassium chloride  20 mEq Oral BID  . sodium chloride  3 mL Intravenous Q12H  . warfarin  2.5 mg Oral Once per day on Sun Tue Wed Thu Sat  . warfarin  5 mg Oral Once per day on Mon Fri  . Warfarin - Pharmacist Dosing Inpatient   Does not apply q1800    Infusions: . sodium chloride    . milrinone 0.25 mcg/kg/min (06/20/14 0249)    PRN Medications: sodium chloride, benzonatate, menthol-cetylpyridinium, sodium chloride, sodium chloride   Assessment:   1) A/C systolic HF 2) Acute hypoxic resp failure 3) NICM - EF 15-20% 4) Afib/flutter - persistent. S/p AV node ablation and CRT 5) AKI on CKD, stage 4 6) HTN 7) Deconditioning  8) Hypokalemia  Plan/Discussion:    Very difficult situation. Unfortunately Mrs. Mcbane is suffering from end stage heart failure and has been on home milrinone and admitted with recurrent ADHF and a/c renal failure.  Much improved with diuresis. Now back in NSR after DCCV. Weight down 22 pounds. Switched to po diuretics yesterday. Renal function now stabilized. Will consult PT.  Need to make sure weight is stable on po diuretics prior to DC.    Truman Haywardaniel Bensimhon,MD 6:32 AM

## 2014-06-20 NOTE — Progress Notes (Signed)
Physical Therapy Treatment Patient Details Name: [name] MRN: [id number] DOB: July 23, 1933 Today's Date: 06/20/2014    History of Present Illness [name] is a 80 y.o. female with a history of NICM s/p BiV ICD, HTN, chronic systolic HF, permanent afib (s/p AVN ablation 2010 and again repeat 2015), CVA, DM2 and CKD stage IV who presents to [facility] today with SOB.due to CHF    PT Comments    Pt able to ambulate farther today with RW.  SOB noted, but o2 97% after gait.  Follow Up Recommendations  Home health PT;Supervision for mobility/OOB     Equipment Recommendations  None recommended by PT    Recommendations for Other Services       Precautions / Restrictions Precautions Precautions: Fall    Mobility  Bed Mobility Overal bed mobility: Modified Independent                Transfers     Transfers: Sit to/from Stand Sit to Stand: Supervision            Ambulation/Gait Ambulation/Gait assistance: Min guard Ambulation Distance (Feet): 50 Feet Assistive device: Rolling walker (2 wheeled) Gait Pattern/deviations: Step-through pattern;Trunk flexed Gait velocity: decreased   General Gait Details: RW too far forward. Pt with general complaints of B knee/back chronic arthritis pain.   Stairs            Wheelchair Mobility    Modified Rankin (Stroke Patients Only)       Balance           Standing balance support: During functional activity Standing balance-Leahy Scale: Fair                      Cognition Arousal/Alertness: Awake/alert Behavior During Therapy: WFL for tasks assessed/performed Overall Cognitive Status: Within Functional Limits for tasks assessed                      Exercises General Exercises - Lower Extremity Ankle Circles/Pumps: AROM;Both;10 reps;Seated Long Arc Quad: Strengthening;10 reps;Both;Seated Hip ABduction/ADduction: Both;10 reps;Seated;Strengthening Hip Flexion/Marching:  Strengthening;Both;10 reps;Seated    General Comments        Pertinent Vitals/Pain Pain Assessment: Faces Faces Pain Scale: Hurts a little bit Pain Location: B knees/back Pain Descriptors / Indicators: Other (Comment) (arthritis) Pain Intervention(s): Monitored during session HR97 bpm o2 on RA 97% after gait    Home Living                      Prior Function            PT Goals (current goals can now be found in the care plan section) Acute Rehab PT Goals Patient Stated Goal: to get back to using a cane on a regular basis PT Goal Formulation: With patient Potential to Achieve Goals: Good Progress towards PT goals: Progressing toward goals    Frequency  Min 3X/week    PT Plan Current plan remains appropriate    Co-evaluation             End of Session Equipment Utilized During Treatment: Gait belt Activity Tolerance: Patient tolerated treatment well Patient left: in chair;with call bell/phone within reach     Time: 1000-1019 PT Time Calculation (min): 19 min  Charges:  $Gait Training: 8-22 mins                    G Codes:      [name] 06/20/2014,  11:13 AM

## 2014-06-21 ENCOUNTER — Encounter (HOSPITAL_COMMUNITY): Payer: Medicare HMO

## 2014-06-21 ENCOUNTER — Encounter (HOSPITAL_COMMUNITY): Payer: Self-pay

## 2014-06-21 LAB — PROTIME-INR
INR: 2.59 — ABNORMAL HIGH (ref 0.00–1.49)
Prothrombin Time: 28 seconds — ABNORMAL HIGH (ref 11.6–15.2)

## 2014-06-21 LAB — BASIC METABOLIC PANEL
Anion gap: 15 (ref 5–15)
BUN: 37 mg/dL — ABNORMAL HIGH (ref 6–23)
CO2: 30 mEq/L (ref 19–32)
Calcium: 8.9 mg/dL (ref 8.4–10.5)
Chloride: 99 mEq/L (ref 96–112)
Creatinine, Ser: 2.24 mg/dL — ABNORMAL HIGH (ref 0.50–1.10)
GFR, EST AFRICAN AMERICAN: 23 mL/min — AB (ref >90)
GFR, EST NON AFRICAN AMERICAN: 20 mL/min — AB (ref >90)
Glucose, Bld: 89 mg/dL (ref 70–99)
POTASSIUM: 3.1 meq/L — AB (ref 3.7–5.3)
SODIUM: 144 meq/L (ref 137–147)

## 2014-06-21 LAB — CARBOXYHEMOGLOBIN
CARBOXYHEMOGLOBIN: 1.6 % — AB (ref 0.5–1.5)
Methemoglobin: 0.9 % (ref 0.0–1.5)
O2 Saturation: 55.8 %
Total hemoglobin: 10.7 g/dL — ABNORMAL LOW (ref 12.0–16.0)

## 2014-06-21 MED ORDER — POTASSIUM CHLORIDE ER 10 MEQ PO TBCR
40.0000 meq | EXTENDED_RELEASE_TABLET | Freq: Two times a day (BID) | ORAL | Status: DC
  Administered 2014-06-21 – 2014-06-22 (×3): 40 meq via ORAL
  Filled 2014-06-21 (×4): qty 4

## 2014-06-21 NOTE — Progress Notes (Signed)
ANTICOAGULATION CONSULT NOTE - Follow Up Consult  Pharmacy Consult for Coumadin Indication: atrial fibrillation  Allergies  Allergen Reactions  . Tuberculin Tests Swelling    Patient Measurements: Height: 5\' 4"  (162.6 cm) Weight: 178 lb 4.8 oz (80.876 kg) (b scale) IBW/kg (Calculated) : 54.7  Vital Signs: Temp: 97.7 F (36.5 C) (10/21 0537) Temp Source: Oral (10/21 0537) BP: 130/81 mmHg (10/21 0537) Pulse Rate: 84 (10/21 0537)  Labs:  Recent Labs  06/19/14 0507 06/20/14 0513 06/21/14 0450  LABPROT 30.0* 28.6* 28.0*  INR 2.83* 2.66* 2.59*  CREATININE 2.63* 2.29* 2.24*    Estimated Creatinine Clearance: 21 ml/min (by C-G formula based on Cr of 2.24).  Assessment: 81yof continues on coumadin for afib. She is s/p DCCV on 10/19 and is maintaining NSR. Also continues on amiodarone 200mg  bid as pta. INR is therapeutic on home regimen 2.5mg  daily except 5mg  Mon/Fri. No bleeding reported.  Goal of Therapy:  INR 2-3 Monitor platelets by anticoagulation protocol: Yes   Plan:  1) Continue home regimen - will get 2.5mg  tonight 2) INR in AM  Fredrik Rigger 06/21/2014,11:35 AM

## 2014-06-21 NOTE — Progress Notes (Signed)
Advanced Heart Failure Rounding Note   Subjective:    [name] is a 80 y.o. female with a history of NICM s/p BiV ICD, HTN, chronic systolic HF, permanent afib (s/p AVN ablation 2010 and again repeat 2015), CVA, DM2 and CKD stage IV who presents to [facility] today with SOB.   ECHO (04/2014): EF 15-20%, grade III DD, severe MR, biatrial enlargement, mod TR.   Admitted 04/2014 with increased SOB and fatigue. ICD interrogated and showed 1/3 of ventricular beats actually conducted through AV node limiting BiV pacing and underwent repeat AV node ablation. Had RHC showing low output and started on milrinone. Failed milrinone wean. Loaded with amiodarone for potential DCCV. She was seen on 06/07/14 in CHF clinic by Dr. Shirlee Latch and continued on Milrinone at 0.25 mcg. Her weight was 194lbs and she was felt volume overloaded at that time. Lasix, which was previous held due to AKI was then increased lasix to 80 mg daily and spironolactone discontinued. Patients dry weight 168 lbs.   Presented to ED 10/14 with increased SOB and hypoxia with ambulation. Pro-BNP 7005, creatinine 2.99 and BUN 42.  Remains on her home milrinone.   Underwent TEE/DC-CV 10/19. Holding NSR. Remains on oral diuretics. Weight and renal function improving.  Weight down 23 pounds total.   Feeling much better. Mild dyspnea with exertion. Denies orthopnea.    Objective:   Weight Range:  Vital Signs:   Temp:  [97.4 F (36.3 C)-98.1 F (36.7 C)] 97.7 F (36.5 C) (10/21 0537) Pulse Rate:  [84-90] 84 (10/21 0537) Resp:  [16-18] 18 (10/21 0537) BP: (102-130)/(49-81) 130/81 mmHg (10/21 0537) SpO2:  [93 %-100 %] 100 % (10/21 0537) Weight:  [178 lb 4.8 oz (80.876 kg)] 178 lb 4.8 oz (80.876 kg) (10/21 0537) Last BM Date: 06/20/14  Weight change: Filed Weights   06/19/14 0518 06/20/14 0642 06/21/14 0537  Weight: 179 lb 3.2 oz (81.285 kg) 179 lb 3.7 oz (81.3 kg) 178 lb 4.8 oz (80.876 kg)    Intake/Output:   Intake/Output  Summary (Last 24 hours) at 06/21/14 0937 Last data filed at 06/21/14 0600  Gross per 24 hour  Intake    840 ml  Output   3100 ml  Net  -2260 ml     Physical Exam: General: Elderly. Lying in bed. NAD HEENT: normal  Neck: supple. JVP 6-7 Carotids 2+ bilaterally; no bruits. No lymphadenopathy or thryomegaly appreciated.  Cor: PMI normal. Regular rate & rhythm. 2/6 SEM RUSB +s3 Lungs: diminished in the bases  Abdomen: soft, nontender, nondistended. No hepatosplenomegaly. No bruits or masses. Good bowel sounds.  Extremities: no cyanosis, clubbing, rash or edema  Neuro: alert & orientedx3, cranial nerves grossly intact. Moves all 4 extremities w/o difficulty. Affect pleasant.   Telemetry: NSR with V pacing   Labs: Basic Metabolic Panel:  Recent Labs Lab 06/17/14 0615 06/17/14 1145 06/18/14 1140 06/19/14 0507 06/20/14 0513 06/21/14 0450  NA 140  --  138 140 138 144  K 2.4* 4.0 3.1* 3.1* 3.4* 3.1*  CL 103  --  97 97 96 99  CO2 24  --  27 29 29 30   GLUCOSE 82  --  97 95 91 89  BUN 37*  --  40* 45* 40* 37*  CREATININE 2.21*  --  2.44* 2.63* 2.29* 2.24*  CALCIUM 7.6*  --  8.4 8.9 8.8 8.9  MG 1.4*  --  1.6  --   --   --     Liver Function Tests: No  results found for this basename: AST, ALT, ALKPHOS, BILITOT, PROT, ALBUMIN,  in the last 168 hours No results found for this basename: LIPASE, AMYLASE,  in the last 168 hours No results found for this basename: AMMONIA,  in the last 168 hours  CBC:  Recent Labs Lab 06/14/14 1711  WBC 4.7  HGB 10.2*  HCT 29.8*  MCV 87.4  PLT 227    Cardiac Enzymes:  Recent Labs Lab 06/14/14 1930 06/15/14 0110 06/15/14 0705  TROPONINI <0.30 <0.30 <0.30    BNP: BNP (last 3 results)  Recent Labs  04/05/14 1312 04/09/14 0424 06/14/14 1711  PROBNP 5310.0* 3361.0* 7005.0*       Imaging: No results found.   Medications:     Scheduled Medications: . allopurinol  100 mg Oral Daily  . amiodarone  200 mg Oral BID  .  atorvastatin  20 mg Oral q1800  . colchicine  0.3 mg Oral Daily  . ferrous sulfate  325 mg Oral Q breakfast  . furosemide  40 mg Oral q1800  . furosemide  80 mg Oral Q breakfast  . hydrALAZINE  37.5 mg Oral 3 times per day  . isosorbide mononitrate  30 mg Oral Daily  . potassium chloride  20 mEq Oral BID  . potassium chloride  40 mEq Oral Once  . sodium chloride  3 mL Intravenous Q12H  . warfarin  2.5 mg Oral Once per day on Sun Tue Wed Thu Sat  . warfarin  5 mg Oral Once per day on Mon Fri  . Warfarin - Pharmacist Dosing Inpatient   Does not apply q1800    Infusions: . sodium chloride    . milrinone 0.25 mcg/kg/min (06/21/14 0729)    PRN Medications: sodium chloride, benzonatate, menthol-cetylpyridinium, sodium chloride, sodium chloride   Assessment:   1) A/C systolic HF 2) Acute hypoxic resp failure 3) NICM - EF 15-20% 4) Afib/flutter - persistent. S/p AV node ablation and CRT 5) AKI on CKD, stage 4 6) HTN 7) Deconditioning  8) Hypokalemia  Plan/Discussion:    Very difficult situation. Unfortunately [name] is suffering from end stage heart failure and has been on home milrinone and admitted with recurrent ADHF and a/c renal failure.  Maintaining NSR after DCCV. Continue amiodarone 200 mg twice a day. On coumadin. INR 2.59  Coumadin per pharmacy. Outpatient coumadin will be followed by Dr Megan MansMcGinnis.    Weight down 23 pounds. Continue lasix 80 mg /40 mg of po lasix. Continue current of hydralazine/imdur. Renal function ok. Supplement potassium. CO-OX ok 55% on milrinone at 0.25 mcg. No beta blocker given low output.   [facility] following for home milrinone. Will need HH PT. Will ask [facility] to add.     CLEGG,AMY, NP-C  9:37 AM   Patient seen and examined with Tonye BecketAmy Clegg, NP. We discussed all aspects of the encounter. I agree with the assessment and plan as stated above.   Improving slowly. Remains in NSR. Continues to diurese on oral diuretics. Supping Kcl. [facility]  [facility].  Truman Haywardaniel Kendrix Orman,MD 4:36 PM

## 2014-06-21 NOTE — Progress Notes (Signed)
CARDIAC REHAB PHASE I   PRE:  Rate/Rhythm: 85 PAcing  BP:  Supine: 100/50  Sitting:   Standing:    SaO2: 100 RA  MODE:  Ambulation: 108 ft   POST:  Rate/Rhythm: 72  BP:  Supine:   Sitting: 98/60  Standing:    SaO2: 98 RA 1155-1220 Assisted X 1 and used walker to ambulate. Gait steady with walker. Pt leans forward walking and needs reminders for upright posture. Pt able to walk 108 feet. VS stable Pt to bed after walk per her her request. She states that walks felt good tired and some SOB after walk. RA sat after walk 98%.  Melina Copa RN 06/21/2014 12:19 PM

## 2014-06-21 NOTE — Discharge Summary (Signed)
Advanced Heart Failure Team  Discharge Summary   Patient ID: Brandi HackMargaret L Carbone MRN: [id number], DOB/AGE: 78/04/1933 78 y.o. Admit date: 06/14/2014 D/C date:     06/22/2014   Primary Discharge Diagnoses:  1) A/C systolic HF  2) Acute hypoxic resp failure  3) NICM - EF 15-20%  4) Afib/flutter  - persistent. S/p AV node ablation and CRT. 06/19/14 S/P TEE/DC-CV successful. On Coumadin. On amiodarone 200 mg twice a day 5) AKI on CKD, stage 4  6) HTN  7) Deconditioning  8) Hypokalemia 9) Hyperlipidemia- on atorvastatin 10) Gout- on allopurinol    Hospital Course:  [name] is a 80 y.o. female with a history of NICM s/p BiV ICD, HTN, chronic systolic HF, permanent afib (s/p AVN ablation 2010 and again repeat 2015), CVA, DM2 and CKD stage IV.   She was last admitted August 2015 with increased SOB and fatigue. ICD interrogation showed 1/3 of ventricular beats conducted through AV node limiting BiV pacing and underwent repeat AV node ablation. Had RHC showing low output and started on milrinone. Failed milrinone wean. Loaded with amiodarone for potential DCCV. She was seen on 06/07/14 in CHF clinic by Dr. Shirlee LatchMcLean and continued on Milrinone at 0.25 mcg. Her weight was 194lbs and she was felt volume overloaded at that time. Lasix, which was previous held due to AKI was then increased lasix to 80 mg daily and spironolactone discontinued.   [facility] with increased SOB and hypoxia with ambulation. On admission she had worsening creatinine and her weight was well above her baseline. She was also noted to be back in A fib. She was admitted on home milrinone and she placed on IV lasix. After she was adequately diuresed she transitioned to po lasix and had TEE that showed no clot and this was followed by DC-CV that was successful. She will continue on amiodarone 200 mg twice a day as well as coumadin.   From HF perspective she will be discharged on a slightly higher dose of lasix (80  mg/40 mg ) as well as hydralazine. She will continue on her home imdur dose. Overall she was diuresed 24 pounds.   She was evaluated by PT for deconditioning and home PT was recommended. She will continue to be followed closely in the HF clinic and has an appointment next week.  [facility] will follow for home RN/PT and milrinone. [facility] will perform weekly bmet and INR.  INR will be continue to be managed by her PCP,  Dr Megan MansMcGinnis.    Discharge Weight Range: Weight 177 pounds.  Discharge Vitals: Blood pressure 104/61, pulse 80, temperature 97.9 F (36.6 C), temperature source Oral, resp. rate 16, height 5\' 4"  (1.626 m), weight 177 lb 14.6 oz (80.7 kg), SpO2 100.00%.  Labs: Lab Results  Component Value Date   WBC 4.3 06/22/2014   HGB 10.4* 06/22/2014   HCT 31.7* 06/22/2014   MCV 88.3 06/22/2014   PLT 220 06/22/2014     Recent Labs Lab 06/22/14 0515  NA 141  K 3.1*  CL 97  CO2 30  BUN 35*  CREATININE 2.05*  CALCIUM 9.0  GLUCOSE 89   Lab Results  Component Value Date   CHOL 230* 10/08/2009   HDL 37* 10/08/2009   LDLCALC 155* 10/08/2009   TRIG 190* 10/08/2009   BNP (last 3 results)  Recent Labs  04/05/14 1312 04/09/14 0424 06/14/14 1711  PROBNP 5310.0* 3361.0* 7005.0*    Diagnostic Studies/Procedures   No results found.  Discharge Medications     Medication List         allopurinol 100 MG tablet  Commonly known as:  ZYLOPRIM  Take 1 tablet (100 mg total) by mouth daily.     amiodarone 200 MG tablet  Commonly known as:  PACERONE  Take 1 tablet (200 mg total) by mouth 2 (two) times daily.     atorvastatin 20 MG tablet  Commonly known as:  LIPITOR  Take 1 tablet (20 mg total) by mouth daily.     colchicine 0.6 MG tablet  Take 0.5 tablets (0.3 mg total) by mouth daily.     ferrous sulfate 325 (65 FE) MG tablet  Take 1 tablet (325 mg total) by mouth 2 (two) times daily with a meal.     Fish Oil 1000 MG Caps  Take 1 capsule by mouth daily.     furosemide 40 MG  tablet  Commonly known as:  LASIX  Take 80 mg in am and 40 mg in pm     hydrALAZINE 25 MG tablet  Commonly known as:  APRESOLINE  Take 1.5 tablets (37.5 mg total) by mouth 3 (three) times daily.     isosorbide mononitrate 60 MG 24 hr tablet  Commonly known as:  IMDUR  Take 1 tablet (60 mg total) by mouth daily.     milrinone 20 MG/100ML Soln infusion  Commonly known as:  PRIMACOR  Inject 20.175 mcg/min into the vein continuous. 0.25 mcg/kg/min     multivitamin tablet  Take 0.5 tablets by mouth daily.     nitroGLYCERIN 0.4 MG SL tablet  Commonly known as:  NITROSTAT  Place 0.4 mg under the tongue every 5 (five) minutes as needed for chest pain.     potassium chloride SA 20 MEQ tablet  Commonly known as:  K-DUR,KLOR-CON  Take 2 tablets (40 mEq total) by mouth 3 (three) times daily.     warfarin 2.5 MG tablet  Commonly known as:  COUMADIN  Take 1 tablet per day on Sun Tue Wed Thu Sat and 2 tabs per day on Mon & Fri.        Disposition   The patient will be discharged in stable condition to home. Discharge Instructions   Contraindication to ACEI at discharge    Complete by:  As directed      Diet - low sodium heart healthy    Complete by:  As directed      Heart Failure patients record your daily weight using the same scale at the same time of day    Complete by:  As directed      Increase activity slowly    Complete by:  As directed           Follow-up Information   Follow up with Arvilla Meres, MD On 06/28/2014. (at 2:05 Garage Code [id number])    Specialty:  Cardiology   Contact information:   [address] [place] [address] [phone]         Duration of Discharge Encounter: Greater than 35 minutes   Signed, CLEGG,AMY NP-C   06/22/2014, 1:21 PM   Patient seen and examined with Tonye Becket, NP. We discussed all aspects of the encounter. I agree with the assessment and plan as stated above.   She remains very tenuous despite IV  inotropes. Hopefully restoration of NSR will help. We had extensive discussions with her and her husband that if she continues to deteriorate she would not want  heroic measures. She is not VAD candidate due to fraility and renal failure.   Daniel Bensimhon,MD 9:22 PM

## 2014-06-21 NOTE — Progress Notes (Signed)
UR complete.  Miloh Alcocer RN, MSN 

## 2014-06-22 ENCOUNTER — Encounter: Payer: Self-pay | Admitting: Internal Medicine

## 2014-06-22 LAB — CARBOXYHEMOGLOBIN
Carboxyhemoglobin: 1.8 % — ABNORMAL HIGH (ref 0.5–1.5)
Methemoglobin: 0.8 % (ref 0.0–1.5)
O2 Saturation: 72.8 %
Total hemoglobin: 11.4 g/dL — ABNORMAL LOW (ref 12.0–16.0)

## 2014-06-22 LAB — BASIC METABOLIC PANEL
ANION GAP: 14 (ref 5–15)
BUN: 35 mg/dL — AB (ref 6–23)
CHLORIDE: 97 meq/L (ref 96–112)
CO2: 30 meq/L (ref 19–32)
CREATININE: 2.05 mg/dL — AB (ref 0.50–1.10)
Calcium: 9 mg/dL (ref 8.4–10.5)
GFR calc Af Amer: 25 mL/min — ABNORMAL LOW (ref >90)
GFR calc non Af Amer: 22 mL/min — ABNORMAL LOW (ref >90)
Glucose, Bld: 89 mg/dL (ref 70–99)
Potassium: 3.1 mEq/L — ABNORMAL LOW (ref 3.7–5.3)
Sodium: 141 mEq/L (ref 137–147)

## 2014-06-22 LAB — CBC
HCT: 31.7 % — ABNORMAL LOW (ref 36.0–46.0)
HEMOGLOBIN: 10.4 g/dL — AB (ref 12.0–15.0)
MCH: 29 pg (ref 26.0–34.0)
MCHC: 32.8 g/dL (ref 30.0–36.0)
MCV: 88.3 fL (ref 78.0–100.0)
Platelets: 220 10*3/uL (ref 150–400)
RBC: 3.59 MIL/uL — ABNORMAL LOW (ref 3.87–5.11)
RDW: 18.1 % — ABNORMAL HIGH (ref 11.5–15.5)
WBC: 4.3 10*3/uL (ref 4.0–10.5)

## 2014-06-22 LAB — PROTIME-INR
INR: 2.4 — AB (ref 0.00–1.49)
PROTHROMBIN TIME: 26.4 s — AB (ref 11.6–15.2)

## 2014-06-22 LAB — MAGNESIUM: Magnesium: 1.6 mg/dL (ref 1.5–2.5)

## 2014-06-22 MED ORDER — FERROUS SULFATE 325 (65 FE) MG PO TABS: 325.0000 mg | ORAL_TABLET | Freq: Every day | ORAL | Status: DC

## 2014-06-22 MED ORDER — HYDRALAZINE HCL 25 MG PO TABS: 37.5000 mg | ORAL_TABLET | Freq: Three times a day (TID) | ORAL | Status: DC

## 2014-06-22 MED ORDER — COLCHICINE 0.6 MG PO TABS: 0.3000 mg | ORAL_TABLET | Freq: Every day | ORAL | Status: AC

## 2014-06-22 MED ORDER — AMIODARONE HCL 200 MG PO TABS: 200.0000 mg | ORAL_TABLET | Freq: Two times a day (BID) | ORAL | Status: DC

## 2014-06-22 MED ORDER — MILRINONE IN DEXTROSE 20 MG/100ML IV SOLN: 0.2500 ug/kg/min | INTRAVENOUS | Status: DC

## 2014-06-22 MED ORDER — POTASSIUM CHLORIDE CRYS ER 20 MEQ PO TBCR: 40.0000 meq | EXTENDED_RELEASE_TABLET | Freq: Three times a day (TID) | ORAL | Status: DC

## 2014-06-22 MED ORDER — FUROSEMIDE 40 MG PO TABS: ORAL_TABLET | ORAL | Status: DC

## 2014-06-22 MED ORDER — ATORVASTATIN CALCIUM 20 MG PO TABS: 20.0000 mg | ORAL_TABLET | Freq: Every day | ORAL | Status: DC

## 2014-06-22 MED ORDER — ALLOPURINOL 100 MG PO TABS: 100.0000 mg | ORAL_TABLET | Freq: Every day | ORAL | Status: DC

## 2014-06-22 MED ORDER — POTASSIUM CHLORIDE CRYS ER 20 MEQ PO TBCR
40.0000 meq | EXTENDED_RELEASE_TABLET | Freq: Two times a day (BID) | ORAL | Status: DC
  Administered 2014-06-22: 40 meq via ORAL
  Filled 2014-06-22: qty 2

## 2014-06-22 NOTE — Progress Notes (Signed)
Advanced Heart Failure Rounding Note   Subjective:    [name] is a 80 y.o. female with a history of NICM s/p BiV ICD, HTN, chronic systolic HF, permanent afib (s/p AVN ablation 2010 and again repeat 2015), CVA, DM2 and CKD stage IV who presents to [facility] today with SOB.   ECHO (04/2014): EF 15-20%, grade III DD, severe MR, biatrial enlargement, mod TR.   Admitted 04/2014 with increased SOB and fatigue. ICD interrogated and showed 1/3 of ventricular beats actually conducted through AV node limiting BiV pacing and underwent repeat AV node ablation. Had RHC showing low output and started on milrinone. Failed milrinone wean. Loaded with amiodarone for potential DCCV. She was seen on 06/07/14 in CHF clinic by Dr. Shirlee Latch and continued on Milrinone at 0.25 mcg. Her weight was 194lbs and she was felt volume overloaded at that time. Lasix, which was previous held due to AKI was then increased lasix to 80 mg daily and spironolactone discontinued. Patients dry weight 168 lbs.   Presented to ED 10/14 with increased SOB and hypoxia with ambulation. Pro-BNP 7005, creatinine 2.99 and BUN 42.  Remains on her home milrinone.   Underwent TEE/DC-CV 10/19. Holding NSR. Remains on oral diuretics. Weight and renal function improving.  Weight down 23 pounds total.   Feeling much better. Continues to diurese.    Objective:   Weight Range:  Vital Signs:   Temp:  [97.6 F (36.4 C)-98.4 F (36.9 C)] 98.3 F (36.8 C) (10/22 0522) Pulse Rate:  [74-85] 81 (10/22 0753) Resp:  [16-18] 16 (10/22 0753) BP: (102-116)/(53-74) 116/74 mmHg (10/22 0753) SpO2:  [100 %] 100 % (10/22 0753) Weight:  [80.7 kg (177 lb 14.6 oz)] 80.7 kg (177 lb 14.6 oz) (10/22 0522) Last BM Date: 06/21/14  Weight change: Filed Weights   06/20/14 0642 06/21/14 0537 06/22/14 0522  Weight: 81.3 kg (179 lb 3.7 oz) 80.876 kg (178 lb 4.8 oz) 80.7 kg (177 lb 14.6 oz)    Intake/Output:   Intake/Output Summary (Last 24 hours) at 06/22/14  0906 Last data filed at 06/22/14 5643  Gross per 24 hour  Intake 1316.8 ml  Output   2602 ml  Net -1285.2 ml     Physical Exam: General: Elderly. Lying in bed. NAD HEENT: normal  Neck: supple. JVP 6-7 Carotids 2+ bilaterally; no bruits. No lymphadenopathy or thryomegaly appreciated.  Cor: PMI normal. Regular rate & rhythm. 2/6 SEM RUSB +s3 Lungs: diminished in the bases  Abdomen: soft, nontender, nondistended. No hepatosplenomegaly. No bruits or masses. Good bowel sounds.  Extremities: no cyanosis, clubbing, rash or edema  Neuro: alert & orientedx3, cranial nerves grossly intact. Moves all 4 extremities w/o difficulty. Affect pleasant.   Telemetry: NSR with V pacing   Labs: Basic Metabolic Panel:  Recent Labs Lab 06/17/14 0615  06/18/14 1140 06/19/14 0507 06/20/14 0513 06/21/14 0450 06/22/14 0515  NA 140  --  138 140 138 144 141  K 2.4*  < > 3.1* 3.1* 3.4* 3.1* 3.1*  CL 103  --  97 97 96 99 97  CO2 24  --  27 29 29 30 30   GLUCOSE 82  --  97 95 91 89 89  BUN 37*  --  40* 45* 40* 37* 35*  CREATININE 2.21*  --  2.44* 2.63* 2.29* 2.24* 2.05*  CALCIUM 7.6*  --  8.4 8.9 8.8 8.9 9.0  MG 1.4*  --  1.6  --   --   --  1.6  < > =  values in this interval not displayed.  Liver Function Tests: No results found for this basename: AST, ALT, ALKPHOS, BILITOT, PROT, ALBUMIN,  in the last 168 hours No results found for this basename: LIPASE, AMYLASE,  in the last 168 hours No results found for this basename: AMMONIA,  in the last 168 hours  CBC:  Recent Labs Lab 06/22/14 0515  WBC 4.3  HGB 10.4*  HCT 31.7*  MCV 88.3  PLT 220    Cardiac Enzymes: No results found for this basename: CKTOTAL, CKMB, CKMBINDEX, TROPONINI,  in the last 168 hours  BNP: BNP (last 3 results)  Recent Labs  04/05/14 1312 04/09/14 0424 06/14/14 1711  PROBNP 5310.0* 3361.0* 7005.0*       Imaging: No results found.   Medications:     Scheduled Medications: . allopurinol  100 mg Oral  Daily  . amiodarone  200 mg Oral BID  . atorvastatin  20 mg Oral q1800  . colchicine  0.3 mg Oral Daily  . ferrous sulfate  325 mg Oral Q breakfast  . furosemide  40 mg Oral q1800  . furosemide  80 mg Oral Q breakfast  . hydrALAZINE  37.5 mg Oral 3 times per day  . isosorbide mononitrate  30 mg Oral Daily  . potassium chloride  40 mEq Oral BID  . potassium chloride  40 mEq Oral Once  . sodium chloride  3 mL Intravenous Q12H  . warfarin  2.5 mg Oral Once per day on Sun Tue Wed Thu Sat  . warfarin  5 mg Oral Once per day on Mon Fri  . Warfarin - Pharmacist Dosing Inpatient   Does not apply q1800    Infusions: . sodium chloride    . milrinone 0.25 mcg/kg/min (06/21/14 1738)    PRN Medications: sodium chloride, benzonatate, menthol-cetylpyridinium, sodium chloride, sodium chloride   Assessment:   1) A/C systolic HF 2) Acute hypoxic resp failure 3) NICM - EF 15-20% 4) Afib/flutter - persistent. S/p AV node ablation and CRT 5) AKI on CKD, stage 4 6) HTN 7) Deconditioning  8) Hypokalemia  Plan/Discussion:     Maintaining NSR after DCCV. Continue amiodarone 200 mg twice a day. On coumadin. INR 2.4   Outpatient coumadin will be followed by Dr Megan MansMcGinnis.    Weight down 24 pounds. Continue lasix 80 mg /40 mg of po lasix. Continue current of hydralazine/imdur. Renal function ok. Supplement potassium. CO-OX ok 55% on milrinone at 0.25 mcg. No beta blocker given low output.   [facility] following for home milrinone. Will need HH PT. Will ask [facility] to add.  Can go home today after KCL supplemented.    Arvilla Meresaniel Bensimhon, MD 9:06 AM

## 2014-06-22 NOTE — Progress Notes (Signed)
Patient was educated on process for home medication reconciliation and provided written instructions as well. Patient was also counseled on HF medications and changes to be made upon arriving home. She was provided with the "Living Better with Heart Failure" patient education packet and a medication bag to be brought with her to her follow up clinic visits with her current medications.  Louie Casa, PharmD, BCPS 06/22/2014, 1:47 PM

## 2014-06-22 NOTE — Progress Notes (Signed)
Patient evaluated for community based chronic disease management services with [facility] Care Management Program as a benefit of patient's [employer]. Spoke with patient and spouse at bedside to explain [facility] Care Management services.  Patient has declined services at this time.  Left contact information and [facility] literature at bedside. Made Inpatient Case Manager aware that [facility] Care Management following. Of note, [facility] Care Management services does not replace or interfere with any services that are arranged by inpatient case management or social work.  For additional questions or referrals please contact Anibal Henderson BSN RN [facility] Liaison at [phone].

## 2014-06-22 NOTE — Progress Notes (Signed)
Reviewed low sodium, daily wts, calling MD and increasing activity/ex. Voiced understanding, family in room.  [id number] Ethelda Chick CES, ACSM 2:17 PM 06/22/2014

## 2014-06-22 NOTE — Progress Notes (Signed)
Physical Therapy Treatment Patient Details Name: [name] MRN: [id number] DOB: 11/05/1932 Today's Date: 07/11/14    History of Present Illness [name] is a 80 y.o. female with a history of NICM s/p BiV ICD, HTN, chronic systolic HF, permanent afib (s/p AVN ablation 2010 and again repeat 2015), CVA, DM2 and CKD stage IV who presents to [facility] today with SOB.due to CHF    PT Comments    Pt making steady progress toward her PT goals. Stair education completed today.  Follow Up Recommendations  Home health PT;Supervision for mobility/OOB     Equipment Recommendations  None recommended by PT       Precautions / Restrictions Precautions Precautions: Fall    Mobility  Bed Mobility Overal bed mobility: Modified Independent                Transfers Overall transfer level: Needs assistance Equipment used: Rolling walker (2 wheeled) Transfers: Sit to/from Stand Sit to Stand: Supervision         General transfer comment: cues on hand placement with transfers.  Ambulation/Gait Ambulation/Gait assistance: Supervision Ambulation Distance (Feet): 100 Feet Assistive device: Rolling walker (2 wheeled) Gait Pattern/deviations: Step-through pattern;Decreased stride length;Trunk flexed Gait velocity: decreased Gait velocity interpretation: Below normal speed for age/gender General Gait Details: cues on posture and walker position as pt tends to push walker too far out with gait.    Stairs Stairs: Yes Stairs assistance: Supervision Stair Management: One rail Right;Step to pattern;Forwards Number of Stairs: 2 General stair comments: cues on sequence with stairs.  Wheelchair Mobility    Modified Rankin (Stroke Patients Only)          Cognition Arousal/Alertness: Awake/alert Behavior During Therapy: WFL for tasks assessed/performed Overall Cognitive Status: Within Functional Limits for tasks assessed                      Exercises  General Exercises - Lower Extremity Ankle Circles/Pumps: AROM;Both;10 reps;Supine Quad Sets: AROM;Strengthening;10 reps;Supine Heel Slides: AROM;Strengthening;10 reps;Supine        Pertinent Vitals/Pain Pain Assessment: No/denies pain     PT Goals (current goals can now be found in the care plan section) Acute Rehab PT Goals Patient Stated Goal: to get back to using a cane on a regular basis PT Goal Formulation: With patient Potential to Achieve Goals: Good Progress towards PT goals: Progressing toward goals    Frequency  Min 3X/week    PT Plan Current plan remains appropriate       End of Session Equipment Utilized During Treatment: Gait belt Activity Tolerance: Patient tolerated treatment well Patient left: in bed;with family/visitor present;with call bell/phone within reach     Time: 0900-0935 PT Time Calculation (min): 35 min  Charges:  $Gait Training: 23-37 mins                    G Codes:      Sallyanne Kuster Jul 11, 2014, 1:29 PM  Sallyanne Kuster, PTA Office- [phone]

## 2014-06-23 ENCOUNTER — Encounter: Payer: Self-pay | Admitting: Internal Medicine

## 2014-06-26 ENCOUNTER — Telehealth (HOSPITAL_COMMUNITY): Payer: Self-pay | Admitting: Vascular Surgery

## 2014-06-26 DIAGNOSIS — I5023 Acute on chronic systolic (congestive) heart failure: Secondary | ICD-10-CM

## 2014-06-26 MED ORDER — FUROSEMIDE 40 MG PO TABS: 80.0000 mg | ORAL_TABLET | Freq: Two times a day (BID) | ORAL | Status: DC

## 2014-06-26 NOTE — Telephone Encounter (Signed)
Spoke w/pt, she denies increased SOB and edema but states her wt just keeps going up, she states she has been taking Lasix 80 mg in AM and 40 mg in PM, she is not sure how to work her optivol box to send a transmission, pt is sch to see Tonye Becket, NP on Wed 10/28, will discuss w/MD and call her back

## 2014-06-26 NOTE — Telephone Encounter (Signed)
Pt Husband called pt weight is up.. Friday 175.2, Saturday 177.2 ,Sunday 179.4 ,Monday 180.4.[name] Please advise

## 2014-06-26 NOTE — Telephone Encounter (Signed)
Per Tonye Becket, NP have pt increase lasix to 80 mg bid and keep appt as sch on Wed, pt is aware and agreeable

## 2014-06-26 NOTE — Telephone Encounter (Signed)
Attempted to call pt, line is busy x 3 tries will try again later

## 2014-06-27 ENCOUNTER — Other Ambulatory Visit (HOSPITAL_COMMUNITY): Payer: Self-pay | Admitting: Cardiology

## 2014-06-27 DIAGNOSIS — I5023 Acute on chronic systolic (congestive) heart failure: Secondary | ICD-10-CM

## 2014-06-27 MED ORDER — AMIODARONE HCL 200 MG PO TABS: 200.0000 mg | ORAL_TABLET | Freq: Two times a day (BID) | ORAL | Status: AC

## 2014-06-27 MED ORDER — FUROSEMIDE 40 MG PO TABS: 80.0000 mg | ORAL_TABLET | Freq: Two times a day (BID) | ORAL | Status: DC

## 2014-06-27 MED ORDER — ALLOPURINOL 100 MG PO TABS: 100.0000 mg | ORAL_TABLET | Freq: Every day | ORAL | Status: AC

## 2014-06-27 MED ORDER — POTASSIUM CHLORIDE CRYS ER 20 MEQ PO TBCR: 40.0000 meq | EXTENDED_RELEASE_TABLET | Freq: Three times a day (TID) | ORAL | Status: DC

## 2014-06-27 MED ORDER — ATORVASTATIN CALCIUM 20 MG PO TABS: 20.0000 mg | ORAL_TABLET | Freq: Every day | ORAL | Status: AC

## 2014-06-27 MED ORDER — HYDRALAZINE HCL 25 MG PO TABS: 37.5000 mg | ORAL_TABLET | Freq: Three times a day (TID) | ORAL | Status: AC

## 2014-06-28 ENCOUNTER — Ambulatory Visit (HOSPITAL_COMMUNITY)
Admit: 2014-06-28 | Discharge: 2014-06-28 | Disposition: A | Discharge status: Home / Self Care | Payer: Medicare HMO | Attending: Cardiology | Admitting: Cardiology

## 2014-06-28 ENCOUNTER — Encounter (HOSPITAL_COMMUNITY): Payer: Self-pay

## 2014-06-28 VITALS — BP 107/68 | HR 109 | Resp 18 | Wt 184.4 lb

## 2014-06-28 DIAGNOSIS — I471 Supraventricular tachycardia: Secondary | ICD-10-CM | POA: Diagnosis not present

## 2014-06-28 DIAGNOSIS — I5022 Chronic systolic (congestive) heart failure: Secondary | ICD-10-CM | POA: Diagnosis present

## 2014-06-28 DIAGNOSIS — I129 Hypertensive chronic kidney disease with stage 1 through stage 4 chronic kidney disease, or unspecified chronic kidney disease: Secondary | ICD-10-CM | POA: Diagnosis not present

## 2014-06-28 DIAGNOSIS — Z8673 Personal history of transient ischemic attack (TIA), and cerebral infarction without residual deficits: Secondary | ICD-10-CM | POA: Insufficient documentation

## 2014-06-28 DIAGNOSIS — I481 Persistent atrial fibrillation: Secondary | ICD-10-CM | POA: Insufficient documentation

## 2014-06-28 DIAGNOSIS — I482 Chronic atrial fibrillation, unspecified: Secondary | ICD-10-CM

## 2014-06-28 DIAGNOSIS — I2510 Atherosclerotic heart disease of native coronary artery without angina pectoris: Secondary | ICD-10-CM | POA: Insufficient documentation

## 2014-06-28 DIAGNOSIS — E119 Type 2 diabetes mellitus without complications: Secondary | ICD-10-CM | POA: Diagnosis not present

## 2014-06-28 DIAGNOSIS — N184 Chronic kidney disease, stage 4 (severe): Secondary | ICD-10-CM | POA: Diagnosis not present

## 2014-06-28 DIAGNOSIS — N183 Chronic kidney disease, stage 3 unspecified: Secondary | ICD-10-CM

## 2014-06-28 DIAGNOSIS — Z7901 Long term (current) use of anticoagulants: Secondary | ICD-10-CM | POA: Diagnosis not present

## 2014-06-28 DIAGNOSIS — M109 Gout, unspecified: Secondary | ICD-10-CM

## 2014-06-28 DIAGNOSIS — I429 Cardiomyopathy, unspecified: Secondary | ICD-10-CM | POA: Diagnosis not present

## 2014-06-28 DIAGNOSIS — M10011 Idiopathic gout, right shoulder: Secondary | ICD-10-CM

## 2014-06-28 DIAGNOSIS — Z95810 Presence of automatic (implantable) cardiac defibrillator: Secondary | ICD-10-CM | POA: Diagnosis not present

## 2014-06-28 NOTE — Patient Instructions (Signed)
Doing great!  Follow up in 2 weeks with Dr. Gala Romney  Happy Halloween!  Do the following things EVERYDAY: 1) Weigh yourself in the morning before breakfast. Write it down and keep it in a log. 2) Take your medicines as prescribed 3) Eat low salt foods-Limit salt (sodium) to 2000 mg per day.  4) Stay as active as you can everyday 5) Limit all fluids for the day to less than 2 liters

## 2014-06-28 NOTE — Progress Notes (Signed)
Patient ID: [name], female   DOB: 07/12/1933, 80 y.o.   MRN: [id number] PCP: Dr. [name] Primary Cardiologist: Dr. Ladona Ridgel  HPI: [name] is a 80 yo female with a history of NICM s/p BiV ICD, HTN, chronic systolic HF, permanent afib (s/p AVN ablation 2010 and again repeat 2015), CVA, DM2 and CKD stage IV.  ECHO (04/2014): EF 15-20%, grade III DD, severe MR, biatrial enlargement, mod TR  Admitted 04/2014 with increased SOB and fatigue. ICD interrogated and showed 1/3 of ventricular beats actually conducted through AV node limiting BiV pacing and underwent repeat AV node ablation. Had RHC showing low output and started on milrinone. Failed milrinone wean. Loaded with amiodarone for potential DC-CV.   Admitted to [facility] 10/14 with increased dyspnea. She continued on milrinone and diuresed with IV lasix. She also had TEE/DC-CV for ongoing A fib. This was successful. She was discharge increased oral diuretics and hydralazine. Overall she diuresed 24 pounds. Discharge weight 177 pounds.   Post Hospital Follow up. She returns for post hospital follow up. Mild dyspnea with exertion but overall she feels much better. Denies PND/Orthopnea. Sleeps with [facility] for comfort. Decreased leg edema. Appetite Weight at home 178-180 pounds. She continues on Milrinone at 0.25 mcg. Followed by [facility].    Labs (9/15): K 3.0, creatinine 2.07 => 1.9, HCT 32.7 AST 38 ALT 29 TSH normal  Labs 06/06/14: K 3.9 Creatinine 2.73---> lasix held for 2 days. Labs 06/22/14: K 3.1 Creatinine 2.05    ROS: All systems negative except as listed in HPI, PMH and Problem List.  SH:  History   Social History  . Marital Status: Married    Spouse Name: N/A    Number of Children: N/A  . Years of Education: N/A   Occupational History  . Not on file.   Social History Main Topics  . Smoking status: Never Smoker   . Smokeless tobacco: Never Used  . Alcohol Use: No  . Drug Use: No  . Sexual Activity: No   Other  Topics Concern  . Not on file   Social History Narrative   Married, retired, does not get regular exercise, no caffeine.     FH:  Family History  Problem Relation Age of Onset  . Diabetes Brother   . Heart attack Sister 78    also has liver problems   . Heart attack Mother     deceased    Past Medical History  Diagnosis Date  . Nonischemic cardiomyopathy     a) Initial dx 1992 b) nl coronary angios in 1/03 c) AICD/bivent. PM - 4/09;    . Hypertension   . CKD (chronic kidney disease), stage IV   . PSVT (paroxysmal supraventricular tachycardia)     s/p radiofrequency ablation  . Gout     uric acid of 10 6/11  . Anemia   . Thrombus     left upper extremity; following device insertion  . Chronic anticoagulation   . Coronary artery disease   . Chronic systolic heart failure     a) NICM b) EF 40-45% (01/2009) c) EF 20-25% (04/2014) d) RHC (04/2014): RA 9, RV 34/2/6, PA 34/16 (24), PCWP: 12, Fick CO/CI: 3.6 /1.8, PA sat 50% and 52%  . Automatic implantable cardioverter-defibrillator in situ   . Hepatitis 1962    "don't know which kind"  . CVA (cerebral vascular accident) 1992    "memory not always clear since"  . Chronic mid back pain   .  AV node dysfunction     a) AV node ablation in 2010 and repeat 2015  . CHF (congestive heart failure)   . History of blood transfusion     "w/pregnancy; maybe last time I was in hospital" (06/14/2014)  . DJD (degenerative joint disease)     knees; right hip  . Arthritis     "knees, back" (06/14/2014)    Current Outpatient Prescriptions  Medication Sig Dispense Refill  . allopurinol (ZYLOPRIM) 100 MG tablet Take 1 tablet (100 mg total) by mouth daily.  90 tablet  3  . amiodarone (PACERONE) 200 MG tablet Take 1 tablet (200 mg total) by mouth 2 (two) times daily.  180 tablet  3  . atorvastatin (LIPITOR) 20 MG tablet Take 1 tablet (20 mg total) by mouth daily.  90 tablet  3  . colchicine 0.6 MG tablet Take 0.5 tablets (0.3 mg total) by  mouth daily.  15 tablet  1  . ferrous sulfate 325 (65 FE) MG tablet Take 1 tablet (325 mg total) by mouth 2 (two) times daily with a meal.  60 tablet  0  . furosemide (LASIX) 40 MG tablet Take 2 tablets (80 mg total) by mouth 2 (two) times daily.  360 tablet  3  . hydrALAZINE (APRESOLINE) 25 MG tablet Take 1.5 tablets (37.5 mg total) by mouth 3 (three) times daily.  270 tablet  6  . milrinone (PRIMACOR) 20 MG/100ML SOLN infusion Inject 20.175 mcg/min into the vein continuous. 0.25 mcg/kg/min  100 mL  0  . Multiple Vitamin (MULTIVITAMIN) tablet Take 0.5 tablets by mouth daily.       . Omega-3 Fatty Acids (FISH OIL) 1000 MG CAPS Take 1 capsule by mouth daily.      . potassium chloride SA (K-DUR,KLOR-CON) 20 MEQ tablet Take 2 tablets (40 mEq total) by mouth 3 (three) times daily.  540 tablet  1  . warfarin (COUMADIN) 2.5 MG tablet Take 1 tablet per day on Sun Tue Wed Thu Sat and 2 tabs per day on Mon & Fri.  60 tablet  0  . isosorbide mononitrate (IMDUR) 60 MG 24 hr tablet Take 1 tablet (60 mg total) by mouth daily.  90 tablet  1  . nitroGLYCERIN (NITROSTAT) 0.4 MG SL tablet Place 0.4 mg under the tongue every 5 (five) minutes as needed for chest pain.        No current facility-administered medications for this encounter.    Filed Vitals:   06/28/14 1421  BP: 107/68  Pulse: 109  Resp: 18  Weight: 184 lb 6 oz (83.632 kg)  SpO2: 100%    PHYSICAL EXAM:  General: Chronically ill appearing. No resp difficulty, sitting in wheelchair. Husband present HEENT: normal Neck: supple. 5-6, Carotids 2+ bilaterally; no bruits. No lymphadenopathy or thryomegaly appreciated. Cor: PMI normal. Regular rate & rhythm. 2/6 SEM RUSB Lungs: diminished in the bases Abdomen: soft, nontender, nondistended. No hepatosplenomegaly. No bruits or masses. Good bowel sounds. Extremities: no cyanosis, clubbing, rash.  R and LLE trace edema.  Neuro: alert & orientedx3, cranial nerves grossly intact. Moves all 4  extremities w/o difficulty. Affect pleasant.   ASSESSMENT & PLAN: 1) Chronic systolic HF: Nonischemic CMP.  EF 20-25% (04/2014).  She has BiV-ICD and is s/p AV nodal ablation to promote BiV pacing.  Optivol. Fluid up but does not appear overloaded on exam. Maintaining NSR no Afib. Activity less than 30 minutes.  Volume status stable. Continue lasix 80 mg twice a day.  NYHA class III symptoms.  - Continue home milrinone 0.25 with weekly labs.  -Continue lasix 80 mg mg daily.  Continue KCl to 40 meq tid.  - Stop spironolactone for now. - She is not on BB with recent cardiogenic shock. - No ACE-I with CKD. - Continue hydralazine 37.5  mg TID and Imdur 60 mg daily .  - Reinforced the need and importance of daily weights, a low sodium diet, and fluid restriction (less than 2 L a day). Instructed to call the HF clinic if weight increases more than 3 lbs overnight or 5 lbs in a week.  2) Afib: Persistent, s/p AV node ablation 2010 and repeat 2015.   Had succesful  DC-CV 06/19/14. Continue amiodarone 200 mg twice a day. INR per Dr Megan MansMcGinnis. 3) CKD stage IV: [facility]    Follow up in 2 weeks. Anticipate transtioning back to Dr Wyline MoodBranch. They have requested this due to the long distance to drive.    CLEGG,AMY NP C 06/28/2014 2:36 PM

## 2014-06-29 ENCOUNTER — Telehealth (HOSPITAL_COMMUNITY): Payer: Self-pay | Admitting: Vascular Surgery

## 2014-06-29 NOTE — Telephone Encounter (Signed)
Labs reviewed by Tonye Becket, NP, spiro was stopped will recheck next week with weekly labs

## 2014-06-29 NOTE — Telephone Encounter (Signed)
[facility] called  Pt potassium is 5.2

## 2014-07-01 ENCOUNTER — Telehealth: Payer: Self-pay | Admitting: Cardiology

## 2014-07-01 NOTE — Telephone Encounter (Signed)
[facility] RN called with wt gain from 177.6 to 184.2, pt asymptomatic, though on last visit on the 28th of OCT. Optivol was elevated.  Will give one extra dose 80 mg at noon today and extra 40 mg KDUR. Her next lab draw is on 07/04/14.  Her milrinone is continuous IV.

## 2014-07-03 ENCOUNTER — Telehealth (HOSPITAL_COMMUNITY): Payer: Self-pay | Admitting: Vascular Surgery

## 2014-07-03 NOTE — Telephone Encounter (Signed)
Attempted to call RN back but got her VM, called and spoke w/pt she states wt is up but denies edema or SOB, she states she drinks less than 2 liters daily and eats low salt food, she states her husband gives her the medications so she is unsure how much lasix she is taking and her husband is not home now, will call back in AM to discuss meds with him

## 2014-07-03 NOTE — Telephone Encounter (Signed)
Weight is up 3lbs since yesterday. Pt weight is 186.. 11lbs weight gain 10/23... Pt is very SOB.Marland Kitchen Please advise

## 2014-07-04 ENCOUNTER — Telehealth (HOSPITAL_COMMUNITY): Payer: Self-pay | Admitting: Vascular Surgery

## 2014-07-04 NOTE — Telephone Encounter (Signed)
Spoke w/pt's husband he states pt is taking Lasix 80 mg in AM, 40 mg at lunch and 80 mg at 4 pm and KCL 40 meq 4 times a day, he states the RN was out today and changed her bag and drew labs.  Pt's wt: 10/28 182 10/29 179 10/30 177 10/31 184 11/1 183 11/2 186.2 11/3 187 He states pt is not SOB and only has slight edema in feet.  Will await pt's lab results and call him back

## 2014-07-04 NOTE — Telephone Encounter (Signed)
Nurse called pt weight is up 187.8 low BP SITTING 100/60/ STANDING 90/64 .Marland Kitchen Yellow zone SOB, dry cough, O2 stats 98%.. Please advise

## 2014-07-04 NOTE — Telephone Encounter (Signed)
Heather RN just talked to husband, she is waiting on lab results to further evaluate how to best manage this patient.  Ave Filter

## 2014-07-05 NOTE — Telephone Encounter (Signed)
Received labs today, K 4.9 bun 45 cr 2.98, spoke w/[facility] RN pt's wt up to 1j89.2 lb today, she is more SOB and does have some edema and is much for fatigued than usual, pt also has dry cough, she states pt's BP was 100/60 sitting and dropped to 90/64 with standing.  All info reviewed with Dr Gala Romney he would like pt to get 80 mg IV Lasix for 3 days and take 80 mg PO Lasix in PM, RN aware and agreeable, order faxed to [facility] at [phone]

## 2014-07-07 ENCOUNTER — Telehealth (HOSPITAL_COMMUNITY): Payer: Self-pay | Admitting: Vascular Surgery

## 2014-07-07 NOTE — Telephone Encounter (Signed)
Spoke w/Angie and gave ok for cath flow

## 2014-07-07 NOTE — Telephone Encounter (Signed)
[facility] called PT weight is down.. 187.2 .. IV Lasix 80 MG 3 DAYS.. She needs a  Order for cath flow.. Please advise

## 2014-07-10 ENCOUNTER — Telehealth (HOSPITAL_COMMUNITY): Payer: Self-pay | Admitting: Vascular Surgery

## 2014-07-10 NOTE — Telephone Encounter (Signed)
OPEN IN ERROR 

## 2014-07-11 ENCOUNTER — Telehealth (HOSPITAL_COMMUNITY): Payer: Self-pay | Admitting: Vascular Surgery

## 2014-07-11 NOTE — Telephone Encounter (Signed)
[facility]urse [facility] called she wantst o  Know is pt coming to [facility] to get another PICC line.. Please advise

## 2014-07-11 NOTE — Telephone Encounter (Signed)
Aundra Millet, can you please schedule pt for cath flow in short stay, per Angie she gave her some in the home last week but line is still not returning blood, thanks

## 2014-07-12 ENCOUNTER — Encounter: Payer: Self-pay | Admitting: Internal Medicine

## 2014-07-12 MED ORDER — ALTEPLASE 100 MG IV SOLR
2.0000 mg | Freq: Once | INTRAVENOUS | Status: DC
  Filled 2014-07-12: qty 2

## 2014-07-12 NOTE — Telephone Encounter (Signed)
Do u know when she going to be scheduled for cath flow? Please call Angie and the pt.Marland KitchenMarland Kitchen

## 2014-07-12 NOTE — Telephone Encounter (Signed)
Patient scheduled with medical short stay at 1200 noon tomorrow 07/13/2014 for cath flow through PICC line.  Patient and RN made aware. Ave Filter

## 2014-07-13 ENCOUNTER — Ambulatory Visit (HOSPITAL_COMMUNITY)
Admission: RE | Admit: 2014-07-13 | Discharge: 2014-07-13 | Disposition: A | Discharge status: Home / Self Care | Payer: Medicare HMO | Source: Ambulatory Visit | Attending: Internal Medicine | Admitting: Internal Medicine

## 2014-07-13 ENCOUNTER — Encounter (HOSPITAL_COMMUNITY): Payer: Self-pay

## 2014-07-13 VITALS — BP 90/60 | HR 94 | Wt 189.8 lb

## 2014-07-13 DIAGNOSIS — Z7901 Long term (current) use of anticoagulants: Secondary | ICD-10-CM | POA: Insufficient documentation

## 2014-07-13 DIAGNOSIS — Z8673 Personal history of transient ischemic attack (TIA), and cerebral infarction without residual deficits: Secondary | ICD-10-CM | POA: Diagnosis not present

## 2014-07-13 DIAGNOSIS — I429 Cardiomyopathy, unspecified: Secondary | ICD-10-CM | POA: Insufficient documentation

## 2014-07-13 DIAGNOSIS — I2510 Atherosclerotic heart disease of native coronary artery without angina pectoris: Secondary | ICD-10-CM | POA: Diagnosis not present

## 2014-07-13 DIAGNOSIS — I482 Chronic atrial fibrillation: Secondary | ICD-10-CM | POA: Diagnosis not present

## 2014-07-13 DIAGNOSIS — I5023 Acute on chronic systolic (congestive) heart failure: Secondary | ICD-10-CM

## 2014-07-13 DIAGNOSIS — I5022 Chronic systolic (congestive) heart failure: Secondary | ICD-10-CM | POA: Diagnosis not present

## 2014-07-13 DIAGNOSIS — Z95810 Presence of automatic (implantable) cardiac defibrillator: Secondary | ICD-10-CM | POA: Diagnosis not present

## 2014-07-13 DIAGNOSIS — Z79899 Other long term (current) drug therapy: Secondary | ICD-10-CM | POA: Insufficient documentation

## 2014-07-13 DIAGNOSIS — N184 Chronic kidney disease, stage 4 (severe): Secondary | ICD-10-CM | POA: Diagnosis not present

## 2014-07-13 DIAGNOSIS — I130 Hypertensive heart and chronic kidney disease with heart failure and stage 1 through stage 4 chronic kidney disease, or unspecified chronic kidney disease: Secondary | ICD-10-CM | POA: Diagnosis not present

## 2014-07-13 DIAGNOSIS — I471 Supraventricular tachycardia: Secondary | ICD-10-CM | POA: Diagnosis not present

## 2014-07-13 DIAGNOSIS — E119 Type 2 diabetes mellitus without complications: Secondary | ICD-10-CM | POA: Diagnosis not present

## 2014-07-13 MED ORDER — MILRINONE IN DEXTROSE 20 MG/100ML IV SOLN: 0.3750 ug/kg/min | INTRAVENOUS | Status: AC

## 2014-07-13 MED ORDER — FUROSEMIDE 40 MG PO TABS: ORAL_TABLET | ORAL | Status: DC

## 2014-07-13 MED ORDER — POTASSIUM CHLORIDE CRYS ER 20 MEQ PO TBCR: 20.0000 meq | EXTENDED_RELEASE_TABLET | Freq: Two times a day (BID) | ORAL | Status: DC

## 2014-07-13 MED ORDER — HEPARIN SOD (PORK) LOCK FLUSH 100 UNIT/ML IV SOLN
INTRAVENOUS | Status: AC
  Filled 2014-07-13: qty 5

## 2014-07-13 NOTE — Progress Notes (Signed)
Patient ID: Brandi HackMargaret L [name], female   DOB: 01/02/1933, 80 y.o.   MRN: [id number] PCP: Dr. Butch PennyAngus McInnis Primary Cardiologist: Dr. Ladona Ridgelaylor  HPI: [name] is a 80 yo female with a history of NICM s/p BiV ICD, HTN, chronic systolic HF, permanent afib (s/p AVN ablation 2010 and again repeat 2015), CVA, DM2 and CKD stage IV.  ECHO (04/2014): EF 15-20%, grade III DD, severe MR, biatrial enlargement, mod TR  Admitted 04/2014 with increased SOB and fatigue. ICD interrogated and showed 1/3 of ventricular beats actually conducted through AV node limiting BiV pacing and underwent repeat AV node ablation. Had RHC showing low output and started on milrinone. Failed milrinone wean. Loaded with amiodarone for potential DC-CV.   Ad[facility] 10/14 with increased dyspnea. She continued on milrinone and diuresed with IV lasix. She also had TEE/DC-CV for ongoing A fib. This was successful. She was discharge increased oral diuretics and hydralazine. Overall she diuresed 24 pounds. Discharge weight 177 pounds.   Acute visit: Since last visit spironolactone was stopped and has had increased weight gain. She has received increased diuretics and on 11/9, 11/0 and 11/11 had 80 mg IV lasix. Renal function is up. PICC line was flushed today in short stay d/t not drawing back. Reports breathing a little better but still a little SOB. Denies CP, PND or LE edema. +orthopnea. Weight at home 180-186 lbs. No appetite and nasty taste in mouth. Wants to eat until food in front of her. Following a low salt diet and drinking less than 2L a day. Doing exercises in the house and ambulating in the house with minimal DOE.   Labs (9/15): K 3.0, creatinine 2.07 => 1.9, HCT 32.7 AST 38 ALT 29 TSH normal  Labs 06/06/14: K 3.9 Creatinine 2.73---> lasix held for 2 days. Labs 06/22/14: K 3.1 Creatinine 2.05  Labs 07/05/14: K 4.9, creatinine 2.98, BUN 45 Labs 07/07/14: K 5.2, creatinine 3.71, BUN 47   Optivol:  Maintaining NSR. No  VT. Fluid index way up. Activity level ~0    ROS: All systems negative except as listed in HPI, PMH and Problem List.  SH:  History   Social History  . Marital Status: Married    Spouse Name: N/A    Number of Children: N/A  . Years of Education: N/A   Occupational History  . Not on file.   Social History Main Topics  . Smoking status: Never Smoker   . Smokeless tobacco: Never Used  . Alcohol Use: No  . Drug Use: No  . Sexual Activity: No   Other Topics Concern  . Not on file   Social History Narrative   Married, retired, does not get regular exercise, no caffeine.     FH:  Family History  Problem Relation Age of Onset  . Diabetes Brother   . Heart attack Sister 1549    also has liver problems   . Heart attack Mother     deceased    Past Medical History  Diagnosis Date  . Nonischemic cardiomyopathy     a) Initial dx 1992 b) nl coronary angios in 1/03 c) AICD/bivent. PM - 4/09;    . Hypertension   . CKD (chronic kidney disease), stage IV   . PSVT (paroxysmal supraventricular tachycardia)     s/p radiofrequency ablation  . Gout     uric acid of 10 6/11  . Anemia   . Thrombus     left upper extremity; following device insertion  .  Chronic anticoagulation   . Coronary artery disease   . Chronic systolic heart failure     a) NICM b) EF 40-45% (01/2009) c) EF 20-25% (04/2014) d) RHC (04/2014): RA 9, RV 34/2/6, PA 34/16 (24), PCWP: 12, Fick CO/CI: 3.6 /1.8, PA sat 50% and 52%  . Automatic implantable cardioverter-defibrillator in situ   . Hepatitis 1962    "don't know which kind"  . CVA (cerebral vascular accident) 1992    "memory not always clear since"  . Chronic mid back pain   . AV node dysfunction     a) AV node ablation in 2010 and repeat 2015  . CHF (congestive heart failure)   . History of blood transfusion     "w/pregnancy; maybe last time I was in hospital" (06/14/2014)  . DJD (degenerative joint disease)     knees; right hip  . Arthritis      "knees, back" (06/14/2014)    Current Outpatient Prescriptions  Medication Sig Dispense Refill  . allopurinol (ZYLOPRIM) 100 MG tablet Take 1 tablet (100 mg total) by mouth daily. 90 tablet 3  . amiodarone (PACERONE) 200 MG tablet Take 1 tablet (200 mg total) by mouth 2 (two) times daily. 180 tablet 3  . atorvastatin (LIPITOR) 20 MG tablet Take 1 tablet (20 mg total) by mouth daily. 90 tablet 3  . colchicine 0.6 MG tablet Take 0.5 tablets (0.3 mg total) by mouth daily. 15 tablet 1  . ferrous sulfate 325 (65 FE) MG tablet Take 1 tablet (325 mg total) by mouth 2 (two) times daily with a meal. 60 tablet 0  . furosemide (LASIX) 40 MG tablet Take 2 tablets (80 mg total) by mouth 2 (two) times daily. (Patient taking differently: Take 2 times in AM, 2 tabs at noon and 1 tab in PM) 360 tablet 3  . hydrALAZINE (APRESOLINE) 25 MG tablet Take 1.5 tablets (37.5 mg total) by mouth 3 (three) times daily. 270 tablet 6  . isosorbide mononitrate (IMDUR) 60 MG 24 hr tablet Take 1 tablet (60 mg total) by mouth daily. 90 tablet 1  . milrinone (PRIMACOR) 20 MG/100ML SOLN infusion Inject 20.175 mcg/min into the vein continuous. 0.25 mcg/kg/min 100 mL 0  . Multiple Vitamin (MULTIVITAMIN) tablet Take 0.5 tablets by mouth daily.     . nitroGLYCERIN (NITROSTAT) 0.4 MG SL tablet Place 0.4 mg under the tongue every 5 (five) minutes as needed for chest pain.     . Omega-3 Fatty Acids (FISH OIL) 1000 MG CAPS Take 1 capsule by mouth daily.    . potassium chloride SA (K-DUR,KLOR-CON) 20 MEQ tablet Take 2 tablets (40 mEq total) by mouth 3 (three) times daily. (Patient taking differently: Take 40 mEq by mouth 4 (four) times daily. ) 540 tablet 1  . warfarin (COUMADIN) 2.5 MG tablet Take 1 tablet per day on Sun Tue Wed Thu Sat and 2 tabs per day on Mon & Fri. 60 tablet 0   No current facility-administered medications for this encounter.   Facility-Administered Medications Ordered in Other Encounters  Medication Dose Route  Frequency Provider Last Rate Last Dose  . alteplase (ACTIVASE) injection 2 mg  2 mg Intracatheter Once Dolores Patty, MD      . heparin lock flush 100 UNIT/ML injection             Filed Vitals:   07/13/14 1445  BP: 90/60  Pulse: 94  Weight: 189 lb 12 oz (86.07 kg)  SpO2: 98%    PHYSICAL EXAM:  General: Chronically ill appearing. No resp difficulty, sitting in wheelchair. Husband present HEENT: normal Neck: supple. JVP 9Carotids 2+ bilaterally; no bruits. No lymphadenopathy or thryomegaly appreciated. Cor: PMI normal. Regular rate & rhythm. 2/6 SEM RUSB Loud s3 Lungs: diminished in the bases Abdomen: soft, nontender, nondistended. No hepatosplenomegaly. No bruits or masses. Good bowel sounds. Extremities: no cyanosis, clubbing, rash.  R and LLE 2+ edema.  Neuro: alert & orientedx3, cranial nerves grossly intact. Moves all 4 extremities w/o difficulty. Affect pleasant.   ASSESSMENT & PLAN: 1) Chronic systolic HF: Nonischemic CMP.  EF 20-25% (04/2014).  She has BiV-ICD and is s/p AV nodal ablation to promote BiV pacing. Maintaining NSR  2) Afib: Persistent, s/p AV node ablation 2010 and repeat 2015.  Had succesful  DC-CV 06/19/14. Continue amiodarone 200 mg twice a day. INR per Dr Megan Mans. 3) Acute on CKD stage IV:   Aundria Rud NP C 07/13/2014 2:48 PM  Patient seen and examined with Ulla Potash, NP. We discussed all aspects of the encounter. I agree with the assessment and plan as stated above.   Despite all efforts to maximize her cardiac output, she is actively dying from advanced HF and cardiorenal syndrome. Weight is up 10 pounds despite 3 days of IV lasix. Creatinine getting worse. I discussed this with her and her husband and we discussed the following options  1) Admit to hospital for intensive therapy 2) Hospice Care 3) Home with increased milrinone and IV lasix  They realize none of these options are likely to prolong her life significantly but they are  adamantly against 1 and 2. They want to go home with more milrinone and IV lasix and are aware of the risk of this and dying at home over the next week.   Will give lasix 80 IV at home for next three mornings. Continue po lasix in afternoon and at night. Increase milrinone to 0.375. Decrease KCL to 20 bid. See back next week.   Total time spent 45 minutes. Over half that time spent discussing above.   Truman Hayward 3:48 PM

## 2014-07-13 NOTE — Patient Instructions (Signed)
[facility] will give [facility] Lasix in your IV for 3 days in the mornings, these days only take Lasix 2 tabs and lunch and 1 tab in PM  Decrease Potassium to 1 tab Twice daily   Your physician recommends that you schedule a follow-up appointment in: 1 week

## 2014-07-14 ENCOUNTER — Telehealth (HOSPITAL_COMMUNITY): Payer: Self-pay | Admitting: Vascular Surgery

## 2014-07-14 NOTE — Telephone Encounter (Signed)
Phone line busy, will attempt to call back later to verify potassium dosage for patient.  Should be taking twice daily per Dr. Prescott Gum latest appointment note yesterday.

## 2014-07-14 NOTE — Telephone Encounter (Signed)
Nurse from [facility] need to dosage clarified for the Potassium.. Please advise

## 2014-07-18 ENCOUNTER — Encounter: Payer: Self-pay | Admitting: Internal Medicine

## 2014-07-20 ENCOUNTER — Ambulatory Visit (HOSPITAL_COMMUNITY)
Admission: RE | Admit: 2014-07-20 | Discharge: 2014-07-20 | Disposition: A | Discharge status: Home / Self Care | Payer: Medicare HMO | Source: Ambulatory Visit | Attending: Internal Medicine | Admitting: Internal Medicine

## 2014-07-20 VITALS — BP 98/52 | HR 66 | Wt 193.5 lb

## 2014-07-20 DIAGNOSIS — Z95810 Presence of automatic (implantable) cardiac defibrillator: Secondary | ICD-10-CM | POA: Diagnosis not present

## 2014-07-20 DIAGNOSIS — I129 Hypertensive chronic kidney disease with stage 1 through stage 4 chronic kidney disease, or unspecified chronic kidney disease: Secondary | ICD-10-CM | POA: Insufficient documentation

## 2014-07-20 DIAGNOSIS — I471 Supraventricular tachycardia: Secondary | ICD-10-CM | POA: Insufficient documentation

## 2014-07-20 DIAGNOSIS — I5022 Chronic systolic (congestive) heart failure: Secondary | ICD-10-CM | POA: Diagnosis present

## 2014-07-20 DIAGNOSIS — M109 Gout, unspecified: Secondary | ICD-10-CM | POA: Diagnosis not present

## 2014-07-20 DIAGNOSIS — E119 Type 2 diabetes mellitus without complications: Secondary | ICD-10-CM | POA: Insufficient documentation

## 2014-07-20 DIAGNOSIS — I2510 Atherosclerotic heart disease of native coronary artery without angina pectoris: Secondary | ICD-10-CM | POA: Insufficient documentation

## 2014-07-20 DIAGNOSIS — Z79899 Other long term (current) drug therapy: Secondary | ICD-10-CM | POA: Insufficient documentation

## 2014-07-20 DIAGNOSIS — I5023 Acute on chronic systolic (congestive) heart failure: Secondary | ICD-10-CM

## 2014-07-20 DIAGNOSIS — N179 Acute kidney failure, unspecified: Secondary | ICD-10-CM

## 2014-07-20 DIAGNOSIS — I482 Chronic atrial fibrillation: Secondary | ICD-10-CM | POA: Insufficient documentation

## 2014-07-20 DIAGNOSIS — N184 Chronic kidney disease, stage 4 (severe): Secondary | ICD-10-CM | POA: Insufficient documentation

## 2014-07-20 DIAGNOSIS — Z7901 Long term (current) use of anticoagulants: Secondary | ICD-10-CM | POA: Insufficient documentation

## 2014-07-20 DIAGNOSIS — I429 Cardiomyopathy, unspecified: Secondary | ICD-10-CM | POA: Insufficient documentation

## 2014-07-20 DIAGNOSIS — N189 Chronic kidney disease, unspecified: Secondary | ICD-10-CM

## 2014-07-20 DIAGNOSIS — Z8673 Personal history of transient ischemic attack (TIA), and cerebral infarction without residual deficits: Secondary | ICD-10-CM | POA: Diagnosis not present

## 2014-07-20 DIAGNOSIS — I509 Heart failure, unspecified: Secondary | ICD-10-CM

## 2014-07-20 MED ORDER — METOLAZONE 5 MG PO TABS: ORAL_TABLET | ORAL | Status: AC

## 2014-07-20 MED ORDER — POTASSIUM CHLORIDE CRYS ER 20 MEQ PO TBCR: 20.0000 meq | EXTENDED_RELEASE_TABLET | Freq: Two times a day (BID) | ORAL | Status: DC

## 2014-07-20 NOTE — Progress Notes (Signed)
Patient ID: [name], female   DOB: 05/27/1933, 80 y.o.   MRN: [id number] PCP: Dr. Butch PennyAngus McInnis Primary Cardiologist: Dr. Ladona Ridgelaylor  HPI: [name] is a 80 yo female with a history of NICM s/p BiV ICD, HTN, chronic systolic HF, permanent afib (s/p AVN ablation 2010 and again repeat 2015), CVA, DM2 and CKD stage IV.  ECHO (04/2014): EF 15-20%, grade III DD, severe MR, biatrial enlargement, mod TR  Admitted 04/2014 with increased SOB and fatigue. ICD interrogated and showed 1/3 of ventricular beats actually conducted through AV node limiting BiV pacing and underwent repeat AV node ablation. Had RHC showing low output and started on milrinone. Failed milrinone wean. Loaded with amiodarone for potential DC-CV.   Ad[facility] 10[facility]14 with increased dyspnea. She continued on milrinone and diuresed with IV lasix. She also had TEE/DC-CV for ongoing A fib. This was successful. She was discharge increased oral diuretics and hydralazine. Overall she diuresed 24 pounds. Discharge weight 177 pounds.   Follow up visit: . Last week she was presented with worsening HF and renal failure. End of life discussed and she declined hospice. Given 80 mg IV lasix for 3 days. Weight at home 186 pounds - which is only slightly decreased. However, she is feeling much better. Mild dyspnea with exertion. FNo orthopnea or PND. No palpitations. [facility]. Following a low salt diet and drinking less than 2L a day. Renal function improved.   Labs (9/15): K 3.0, creatinine 2.07 => 1.9, HCT 32.7 AST 38 ALT 29 TSH normal  Labs 06/06/14: K 3.9 Creatinine 2.73---> lasix held for 2 days. Labs 06/22/14: K 3.1 Creatinine 2.05  Labs 07/05/14: K 4.9, creatinine 2.98, BUN 45 Labs 07/07/14: K 5.2, creatinine 3.71, BUN 47  Labs 07/19/14: K 3.7 Creatinine 2.8   ICD interrogated personally in clinic   Maintaining NSR. No VT. Fluid index way up. Activity level ~0    ROS: All systems negative except as  listed in HPI, PMH and Problem List.  SH:  History   Social History  . Marital Status: Married    Spouse Name: N/A    Number of Children: N/A  . Years of Education: N/A   Occupational History  . Not on file.   Social History Main Topics  . Smoking status: Never Smoker   . Smokeless tobacco: Never Used  . Alcohol Use: No  . Drug Use: No  . Sexual Activity: No   Other Topics Concern  . Not on file   Social History Narrative   Married, retired, does not get regular exercise, no caffeine.     FH:  Family History  Problem Relation Age of Onset  . Diabetes Brother   . Heart attack Sister 2349    also has liver problems   . Heart attack Mother     deceased    Past Medical History  Diagnosis Date  . Nonischemic cardiomyopathy     a) Initial dx 1992 b) nl coronary angios in 1/03 c) AICD/bivent. PM - 4/09;    . Hypertension   . CKD (chronic kidney disease), stage IV   . PSVT (paroxysmal supraventricular tachycardia)     s/p radiofrequency ablation  . Gout     uric acid of 10 6/11  . Anemia   . Thrombus     left upper extremity; following device insertion  . Chronic anticoagulation   . Coronary artery disease   . Chronic systolic heart failure  a) NICM b) EF 40-45% (01/2009) c) EF 20-25% (04/2014) d) RHC (04/2014): RA 9, RV 34/2/6, PA 34/16 (24), PCWP: 12, Fick CO/CI: 3.6 /1.8, PA sat 50% and 52%  . Automatic implantable cardioverter-defibrillator in situ   . Hepatitis 1962    "don't know which kind"  . CVA (cerebral vascular accident) 1992    "memory not always clear since"  . Chronic mid back pain   . AV node dysfunction     a) AV node ablation in 2010 and repeat 2015  . CHF (congestive heart failure)   . History of blood transfusion     "w/pregnancy; maybe last time I was in hospital" (06/14/2014)  . DJD (degenerative joint disease)     knees; right hip  . Arthritis     "knees, back" (06/14/2014)    Current Outpatient Prescriptions  Medication Sig  Dispense Refill  . allopurinol (ZYLOPRIM) 100 MG tablet Take 1 tablet (100 mg total) by mouth daily. 90 tablet 3  . amiodarone (PACERONE) 200 MG tablet Take 1 tablet (200 mg total) by mouth 2 (two) times daily. 180 tablet 3  . atorvastatin (LIPITOR) 20 MG tablet Take 1 tablet (20 mg total) by mouth daily. 90 tablet 3  . colchicine 0.6 MG tablet Take 0.5 tablets (0.3 mg total) by mouth daily. 15 tablet 1  . ferrous sulfate 325 (65 FE) MG tablet Take 1 tablet (325 mg total) by mouth 2 (two) times daily with a meal. 60 tablet 0  . furosemide (LASIX) 40 MG tablet Take 2 tabs in AM, 2 tabs at [facility] and 1 tab in PM 360 tablet 3  . hydrALAZINE (APRESOLINE) 25 MG tablet Take 1.5 tablets (37.5 mg total) by mouth 3 (three) times daily. 270 tablet 6  . isosorbide mononitrate (IMDUR) 60 MG 24 hr tablet Take 1 tablet (60 mg total) by mouth daily. 90 tablet 1  . milrinone (PRIMACOR) 20 MG/100ML SOLN infusion Inject 30.2625 mcg/min into the vein continuous. 0.25 mcg/kg/min 100 mL 0  . Multiple Vitamin (MULTIVITAMIN) tablet Take 0.5 tablets by mouth daily.     . nitroGLYCERIN (NITROSTAT) 0.4 MG SL tablet Place 0.4 mg under the tongue every 5 (five) minutes as needed for chest pain.     . Omega-3 Fatty Acids (FISH OIL) 1000 MG CAPS Take 1 capsule by mouth daily.    . potassium chloride SA (K-DUR,KLOR-CON) 20 MEQ tablet Take 1 tablet (20 mEq total) by mouth 2 (two) times daily. 540 tablet 1  . warfarin (COUMADIN) 2.5 MG tablet Take 1 tablet per day on Sun Tue Wed Thu Sat and 2 tabs per day on Mon & Fri. 60 tablet 0   No current facility-administered medications for this encounter.    Filed Vitals:   07/20/14 1455  BP: 98/52  Pulse: 66  Weight: 193 lb 8 oz (87.771 kg)  SpO2: 97%    PHYSICAL EXAM:  General: Chronically ill appearing. No resp difficulty, sitting in wheelchair. Husband present HEENT: normal Neck: supple. JVP to jaw. Carotids 2+ bilaterally; no bruits. No lymphadenopathy or thryomegaly  appreciated. Cor: PMI normal. Regular rate & rhythm. 2/6 SEM RUSB Loud s3 Lungs: diminished in the bases Abdomen: soft, nontender, nondistended. No hepatosplenomegaly. No bruits or masses. Good bowel sounds. Extremities: no cyanosis, clubbing, rash.  R and LLE 2+ edema.  Neuro: alert & orientedx3, cranial nerves grossly intact. Moves all 4 extremities w/o difficulty. Affect pleasant.   ASSESSMENT & PLAN: 1) Chronic systolic HF, end stage: Nonischemic CMP.  EF 20-25% (04/2014).  She has BiV-ICD and is s/p AV nodal ablation to promote BiV pacing. Maintaining NSR  Continue lasix 80 mg in am and 40 mg in pm  Add metolazone 5 mg every Monday and Friday and as needed.  Continue 40 meq of potassium three times a day an extra 20 meq of potassium on Mon and Friday with metolazone.  Continue hydralazine 37.5 mg three times a day/ 60 mg imdur dail.  2) Afib: Persistent, s/p AV node ablation 2010 and repeat 2015.  Had succesful  DC-CV 06/19/14. Continue amiodarone 200 mg twice a day. INR per Dr Megan Mans. 3) Acute on CKD stage IV: Renal function improved. Not HD candidate  Follow up in 2 weeks.   CLEGG,AMY NP C 07/20/2014 3:07 PM   Patient seen and examined with Tonye Becket, NP. We discussed all aspects of the encounter. I agree with the assessment and plan as stated above.   She understands she has end-stage HF but is feeling better with recent IV diuretics. Renal function better. Still with volume on board. Will add metolazone 5mg  on Monday and Friday. Can have [facility] give IV lasix as needed. Refuses Hospice. ICD interrogated personally.   Daniel Bensimhon,MD 7:05 PM

## 2014-07-20 NOTE — Patient Instructions (Signed)
START Metolazone 5 mg on Mondays and Fridays ADD additional 20 meq of potassium on Mondays and Friday with metolazone  Your physician recommends that you schedule a follow-up appointment in: 2 weeks with MD  Do the following things EVERYDAY: 1) Weigh yourself in the morning before breakfast. Write it down and keep it in a log. 2) Take your medicines as prescribed 3) Eat low salt foods-Limit salt (sodium) to 2000 mg per day.  4) Stay as active as you can everyday 5) Limit all fluids for the day to less than 2 liters 6)

## 2014-08-02 ENCOUNTER — Telehealth (HOSPITAL_COMMUNITY): Payer: Self-pay | Admitting: Cardiology

## 2014-08-02 NOTE — Telephone Encounter (Signed)
[name] called during [name] visit Reports 1 lb weight gain daily, last week weight 182 today weight 188.8 lbs 4+ edema in ankles, 3+ edema in lower legs SOB at rest and sometimes dry cough Urine output 1400-1800cc for the week  Pt did start Metolazone as instructed. Pt does have follow up on 12/3 Please advise

## 2014-08-02 NOTE — Telephone Encounter (Signed)
Per Ulla Potash, NP have pt take extra Metolazone today and keep appt as sch for tommrow Angie, RN aware., also called and spoke w/pt and gave her orders she is aware and agreeable

## 2014-08-03 ENCOUNTER — Encounter (HOSPITAL_COMMUNITY): Payer: Self-pay

## 2014-08-03 ENCOUNTER — Encounter: Payer: Self-pay | Admitting: *Deleted

## 2014-08-03 ENCOUNTER — Ambulatory Visit (INDEPENDENT_AMBULATORY_CARE_PROVIDER_SITE_OTHER): Payer: Medicare HMO | Admitting: Internal Medicine

## 2014-08-03 ENCOUNTER — Ambulatory Visit (HOSPITAL_COMMUNITY)
Admission: RE | Admit: 2014-08-03 | Discharge: 2014-08-03 | Disposition: A | Discharge status: Home / Self Care | Payer: Medicare HMO | Source: Ambulatory Visit | Attending: Cardiology | Admitting: Cardiology

## 2014-08-03 VITALS — BP 88/52 | HR 77 | Wt 193.8 lb

## 2014-08-03 DIAGNOSIS — Z95810 Presence of automatic (implantable) cardiac defibrillator: Secondary | ICD-10-CM | POA: Insufficient documentation

## 2014-08-03 DIAGNOSIS — N183 Chronic kidney disease, stage 3 unspecified: Secondary | ICD-10-CM

## 2014-08-03 DIAGNOSIS — M109 Gout, unspecified: Secondary | ICD-10-CM | POA: Insufficient documentation

## 2014-08-03 DIAGNOSIS — Z79899 Other long term (current) drug therapy: Secondary | ICD-10-CM | POA: Diagnosis not present

## 2014-08-03 DIAGNOSIS — Z7901 Long term (current) use of anticoagulants: Secondary | ICD-10-CM | POA: Insufficient documentation

## 2014-08-03 DIAGNOSIS — I482 Chronic atrial fibrillation, unspecified: Secondary | ICD-10-CM

## 2014-08-03 DIAGNOSIS — N184 Chronic kidney disease, stage 4 (severe): Secondary | ICD-10-CM | POA: Insufficient documentation

## 2014-08-03 DIAGNOSIS — I429 Cardiomyopathy, unspecified: Secondary | ICD-10-CM | POA: Diagnosis not present

## 2014-08-03 DIAGNOSIS — M1990 Unspecified osteoarthritis, unspecified site: Secondary | ICD-10-CM | POA: Insufficient documentation

## 2014-08-03 DIAGNOSIS — I5022 Chronic systolic (congestive) heart failure: Secondary | ICD-10-CM | POA: Diagnosis present

## 2014-08-03 DIAGNOSIS — Z8673 Personal history of transient ischemic attack (TIA), and cerebral infarction without residual deficits: Secondary | ICD-10-CM | POA: Insufficient documentation

## 2014-08-03 DIAGNOSIS — I471 Supraventricular tachycardia: Secondary | ICD-10-CM | POA: Insufficient documentation

## 2014-08-03 DIAGNOSIS — E119 Type 2 diabetes mellitus without complications: Secondary | ICD-10-CM | POA: Insufficient documentation

## 2014-08-03 DIAGNOSIS — I2510 Atherosclerotic heart disease of native coronary artery without angina pectoris: Secondary | ICD-10-CM | POA: Diagnosis not present

## 2014-08-03 DIAGNOSIS — I129 Hypertensive chronic kidney disease with stage 1 through stage 4 chronic kidney disease, or unspecified chronic kidney disease: Secondary | ICD-10-CM | POA: Diagnosis not present

## 2014-08-03 DIAGNOSIS — I5023 Acute on chronic systolic (congestive) heart failure: Secondary | ICD-10-CM

## 2014-08-03 DIAGNOSIS — Z01818 Encounter for other preprocedural examination: Secondary | ICD-10-CM

## 2014-08-03 DIAGNOSIS — I428 Other cardiomyopathies: Secondary | ICD-10-CM

## 2014-08-03 LAB — MDC_IDC_ENUM_SESS_TYPE_INCLINIC
Battery Remaining Longevity: 50 mo
Battery Voltage: 2.96 V
Brady Statistic AP VP Percent: 7.48 %
Brady Statistic AS VP Percent: 89.74 %
Brady Statistic RA Percent Paced: 7.7 %
Brady Statistic RV Percent Paced: 94.92 %
Date Time Interrogation Session: 20151203144934
HIGH POWER IMPEDANCE MEASURED VALUE: 47 Ohm
HighPow Impedance: 171 Ohm
HighPow Impedance: 35 Ohm
Lead Channel Impedance Value: 209 Ohm
Lead Channel Impedance Value: 380 Ohm
Lead Channel Impedance Value: 494 Ohm
Lead Channel Impedance Value: 532 Ohm
Lead Channel Pacing Threshold Amplitude: 0.75 V
Lead Channel Pacing Threshold Pulse Width: 0.4 ms
Lead Channel Sensing Intrinsic Amplitude: 1.5 mV
Lead Channel Sensing Intrinsic Amplitude: 3.5 mV
Lead Channel Setting Pacing Amplitude: 2.25 V
Lead Channel Setting Pacing Amplitude: 2.5 V
Lead Channel Setting Pacing Pulse Width: 0.4 ms
Lead Channel Setting Sensing Sensitivity: 0.3 mV
MDC IDC MSMT LEADCHNL LV IMPEDANCE VALUE: 437 Ohm
MDC IDC MSMT LEADCHNL RA PACING THRESHOLD AMPLITUDE: 0.5 V
MDC IDC MSMT LEADCHNL RA PACING THRESHOLD PULSEWIDTH: 0.4 ms
MDC IDC SET ZONE DETECTION INTERVAL: 330 ms
MDC IDC SET ZONE DETECTION INTERVAL: 450 ms
MDC IDC STAT BRADY AP VS PERCENT: 0.22 %
MDC IDC STAT BRADY AS VS PERCENT: 2.56 %
Zone Setting Detection Interval: 280 ms
Zone Setting Detection Interval: 400 ms

## 2014-08-03 NOTE — Progress Notes (Signed)
Patient ID: ICESIS FOLLMER, female   DOB: [birth year], 80 y.o.   MRN: [id number] PCP: Dr. [name] Primary Cardiologist: Dr. Ladona Ridgel  HPI: Ms. Cuellar is a 80 yo female with a history of NICM s/p BiV ICD, HTN, chronic systolic HF, permanent afib (s/p AVN ablation 2010 and again repeat 2015), CVA, DM2 and CKD stage IV.  ECHO (04/2014): EF 15-20%, grade III DD, severe MR, biatrial enlargement, mod TR  Admitted 04/2014 with increased SOB and fatigue. ICD interrogated and showed 1/3 of ventricular beats actually conducted through AV node limiting BiV pacing and underwent repeat AV node ablation. Had RHC showing low output and started on milrinone. Failed milrinone wean. Loaded with amiodarone for potential DC-CV.   Admitted to [facility] 10/14 with increased dyspnea. She continued on milrinone and diuresed with IV lasix. She also had TEE/DC-CV for ongoing A fib. This was successful. She was discharge increased oral diuretics and hydralazine. Overall she diuresed 24 pounds. Discharge weight 177 pounds.   Follow up visit: . We saw her a few weeks ago and weight was up to 193 (about 6-7 pounds up) and we gave her metolazone 5mg  on Monday and Friday. Weight came down to 182. Since that time started creeping back up and metolazone not working as well. Took another metolazone yesterday and still didn't work that much. + edema. Weak. + orthopnea/PND.   Labs (9/15): K 3.0, creatinine 2.07 => 1.9, HCT 32.7 AST 38 ALT 29 TSH normal  Labs 06/06/14: K 3.9 Creatinine 2.73---> lasix held for 2 days. Labs 06/22/14: K 3.1 Creatinine 2.05  Labs 07/05/14: K 4.9, creatinine 2.98, BUN 45 Labs 07/07/14: K 5.2, creatinine 3.71, BUN 47  Labs 07/19/14: K 3.7 Creatinine 2.8  Labs 07/26/14: K 3.6 Creatinine 2.86 BUN 56  ICD interrogated personally in clinic   Maintaining NSR. No VT. Fluid index way up. Activity level ~0    ROS: All systems negative except as listed in HPI, PMH and Problem List.  SH:  History    Social History  . Marital Status: Married    Spouse Name: N/A    Number of Children: N/A  . Years of Education: N/A   Occupational History  . Not on file.   Social History Main Topics  . Smoking status: Never Smoker   . Smokeless tobacco: Never Used  . Alcohol Use: No  . Drug Use: No  . Sexual Activity: No   Other Topics Concern  . Not on file   Social History Narrative   Married, retired, does not get regular exercise, no caffeine.     FH:  Family History  Problem Relation Age of Onset  . Diabetes Brother   . Heart attack Sister 60    also has liver problems   . Heart attack Mother     deceased    Past Medical History  Diagnosis Date  . Nonischemic cardiomyopathy     a) Initial dx 1992 b) nl coronary angios in 1/03 c) AICD/bivent. PM - 4/09;    . Hypertension   . CKD (chronic kidney disease), stage IV   . PSVT (paroxysmal supraventricular tachycardia)     s/p radiofrequency ablation  . Gout     uric acid of 10 6/11  . Anemia   . Thrombus     left upper extremity; following device insertion  . Chronic anticoagulation   . Coronary artery disease   . Chronic systolic heart failure     a) NICM b) EF 40-45% (  01/2009) c) EF 20-25% (04/2014) d) RHC (04/2014): RA 9, RV 34/2/6, PA 34/16 (24), PCWP: 12, Fick CO/CI: 3.6 /1.8, PA sat 50% and 52%  . Automatic implantable cardioverter-defibrillator in situ   . Hepatitis 1962    "don't know which kind"  . CVA (cerebral vascular accident) 1992    "memory not always clear since"  . Chronic mid back pain   . AV node dysfunction     a) AV node ablation in 2010 and repeat 2015  . CHF (congestive heart failure)   . History of blood transfusion     "w/pregnancy; maybe last time I was in hospital" (06/14/2014)  . DJD (degenerative joint disease)     knees; right hip  . Arthritis     "knees, back" (06/14/2014)    Current Outpatient Prescriptions  Medication Sig Dispense Refill  . allopurinol (ZYLOPRIM) 100 MG tablet  Take 1 tablet (100 mg total) by mouth daily. 90 tablet 3  . amiodarone (PACERONE) 200 MG tablet Take 1 tablet (200 mg total) by mouth 2 (two) times daily. 180 tablet 3  . atorvastatin (LIPITOR) 20 MG tablet Take 1 tablet (20 mg total) by mouth daily. 90 tablet 3  . colchicine 0.6 MG tablet Take 0.5 tablets (0.3 mg total) by mouth daily. 15 tablet 1  . ferrous sulfate 325 (65 FE) MG tablet Take 1 tablet (325 mg total) by mouth 2 (two) times daily with a meal. 60 tablet 0  . furosemide (LASIX) 40 MG tablet Take 2 tabs in AM, [facility] and 1 tab in PM 360 tablet 3  . hydrALAZINE (APRESOLINE) 25 MG tablet Take 1.5 tablets (37.5 mg total) by mouth 3 (three) times daily. 270 tablet 6  . isosorbide mononitrate (IMDUR) 60 MG 24 hr tablet Take 1 tablet (60 mg total) by mouth daily. 90 tablet 1  . metolazone (ZAROXOLYN) 5 MG tablet Take one tab po on MONDAYS and FRIDAYS or as directed by provider 90 tablet 3  . milrinone (PRIMACOR) 20 MG/100ML SOLN infusion Inject 30.2625 mcg/min into the vein continuous. 0.25 mcg/kg/min 100 mL 0  . Multiple Vitamin (MULTIVITAMIN) tablet Take 0.5 tablets by mouth daily.     . nitroGLYCERIN (NITROSTAT) 0.4 MG SL tablet Place 0.4 mg under the tongue every 5 (five) minutes as needed for chest pain.     . Omega-3 Fatty Acids (FISH OIL) 1000 MG CAPS Take 1 capsule by mouth daily.    . potassium chloride SA (K-DUR,KLOR-CON) 20 MEQ tablet Take 1 tablet (20 mEq total) by mouth 2 (two) times daily. Take additional 20 MEQ of Potassium on Mondays and Fridays with Metolazone 560 tablet 1  . warfarin (COUMADIN) 2.5 MG tablet Take 1 tablet per day on Sun Tue Wed Thu Sat and 2 tabs per day on Mon & Fri. 60 tablet 0   No current facility-administered medications for this encounter.    Filed Vitals:   08/03/14 0956  BP: 88/52  Pulse: 77  Weight: 193 lb 12.8 oz (87.907 kg)  SpO2: 95%    PHYSICAL EXAM:  General: Chronically ill appearing. No resp difficulty, sitting in  wheelchair. Husband present HEENT: normal Neck: supple. JVP to jaw. Carotids 2+ bilaterally; no bruits. No lymphadenopathy or thryomegaly appreciated. Cor: PMI normal. Regular rate & rhythm. 2/6 SEM RUSB Loud s3 Lungs: diminished in the bases Abdomen: soft, nontender, nondistended. No hepatosplenomegaly. No bruits or masses. Good bowel sounds. Extremities: no cyanosis, clubbing, rash.  R and LLE 2+ edema.  Neuro: alert & orientedx3, cranial nerves grossly intact. Moves all 4 extremities w/o difficulty. Affect pleasant.   ASSESSMENT & PLAN: 1) Chronic systolic HF, end stage: Nonischemic CMP.  EF 20-25% (04/2014).  She has BiV-ICD and is s/p AV nodal ablation to promote BiV pacing. Maintaining NSR  Volume status up again. Not responding as well to metolazone. Will give lasix 80 mg IV for 2 days and see if that helps reset her. Will also give her two of our tables of metolazone that worked in the past and see if there are any differences in formulations.  Increase lasix 80 mg bid Continue metolazone 5 mg every Monday and Friday and as needed.  Continue 40 meq of potassium three times a day an extra 20 meq of potassium on Mon and Friday with metolazone.  Continue hydralazine 37.5 mg three times a day/ 60 mg imdur daily. BP too low to titrate Check labs again tomorrow with [facility] not [facility]osp[facility] 2) Afib: s/p AV node ablation 2010 and repeat 2015.  Had succesful  DC-CV 06/19/14. Continue amiodarone 200 mg twice a day. INR per Dr Megan Mans. 3) Acute on CKD stage IV: Renal function stable. Not HD candidate  Follow up in 3 weeks.    Aprile Dickenson,MD 10:21 AM

## 2014-08-03 NOTE — Assessment & Plan Note (Signed)
Her LV lead is not working. She will continue her current meds. She will be scheduled for LV lead revision.

## 2014-08-03 NOTE — Assessment & Plan Note (Signed)
She will continue her systemic anti-coagulation and hold approx 2 days prior to her procedure.

## 2014-08-03 NOTE — Assessment & Plan Note (Signed)
ICD interogation demonstrated a [employer]onic BiV ICD for which her LV lead is no longer working . Will schedule patient for an LV lead revision.  I have discussed the indications risks/benefits/goals/expectations and she wishes to proceed.

## 2014-08-03 NOTE — Assessment & Plan Note (Signed)
Her blood pressure is now on the low side. She will continue her current meds as tolerated.

## 2014-08-03 NOTE — Progress Notes (Signed)
HPI [name] returns today for followup.she is a very pleasant 80 year old woman with a nonischemic cardiomyopathy, persistent atrial fibrillation, chronic systolic heart failure, left bundle branch block, status post biventricular ICD implantation. The patient has developed increasingly persistent atrial fibrillation over the last year and was placed on amiodarone.  She has maintained NSR. She was hospitalized back in August with worsening CHF and atrial fib with an RVR and underwent AV node ablation. She has continued to have CHF symptoms. Allergies  Allergen Reactions  . Tuberculin Tests Swelling     Current Outpatient Prescriptions  Medication Sig Dispense Refill  . allopurinol (ZYLOPRIM) 100 MG tablet Take 1 tablet (100 mg total) by mouth daily. 90 tablet 3  . amiodarone (PACERONE) 200 MG tablet Take 1 tablet (200 mg total) by mouth 2 (two) times daily. 180 tablet 3  . atorvastatin (LIPITOR) 20 MG tablet Take 1 tablet (20 mg total) by mouth daily. 90 tablet 3  . colchicine 0.6 MG tablet Take 0.5 tablets (0.3 mg total) by mouth daily. 15 tablet 1  . ferrous sulfate 325 (65 FE) MG tablet Take 1 tablet (325 mg total) by mouth 2 (two) times daily with a meal. 60 tablet 0  . furosemide (LASIX) 40 MG tablet Take 2 tabs in AM, 2 tabs at [facility] and 1 tab in PM 360 tablet 3  . hydrALAZINE (APRESOLINE) 25 MG tablet Take 1.5 tablets (37.5 mg total) by mouth 3 (three) times daily. 270 tablet 6  . isosorbide mononitrate (IMDUR) 60 MG 24 hr tablet Take 1 tablet (60 mg total) by mouth daily. 90 tablet 1  . metolazone (ZAROXOLYN) 5 MG tablet Take one tab po on MONDAYS and FRIDAYS or as directed by provider 90 tablet 3  . milrinone (PRIMACOR) 20 MG/100ML SOLN infusion Inject 30.2625 mcg/min into the vein continuous. 0.25 mcg/kg/min 100 mL 0  . Multiple Vitamin (MULTIVITAMIN) tablet Take 0.5 tablets by mouth daily.     . nitroGLYCERIN (NITROSTAT) 0.4 MG SL tablet Place 0.4 mg under the tongue every 5  (five) minutes as needed for chest pain.     . Omega-3 Fatty Acids (FISH OIL) 1000 MG CAPS Take 1 capsule by mouth daily.    . potassium chloride SA (K-DUR,KLOR-CON) 20 MEQ tablet Take 1 tablet (20 mEq total) by mouth 2 (two) times daily. Take additional 20 MEQ of Potassium on Mondays and Fridays with Metolazone 560 tablet 1  . warfarin (COUMADIN) 2.5 MG tablet Take 1 tablet per day on Sun Tue Wed Thu Sat and 2 tabs per day on Mon & Fri. 60 tablet 0   No current facility-administered medications for this visit.     Past Medical History  Diagnosis Date  . Nonischemic cardiomyopathy     a) Initial dx 1992 b) nl coronary angios in 1/03 c) AICD/bivent. PM - 4/09;    . Hypertension   . CKD (chronic kidney disease), stage IV   . PSVT (paroxysmal supraventricular tachycardia)     s/p radiofrequency ablation  . Gout     uric acid of 10 6/11  . Anemia   . Thrombus     left upper extremity; following device insertion  . Chronic anticoagulation   . Coronary artery disease   . Chronic systolic heart failure     a) NICM b) EF 40-45% (01/2009) c) EF 20-25% (04/2014) d) RHC (04/2014): RA 9, RV 34/2/6, PA 34/16 (24), PCWP: 12, Fick CO/CI: 3.6 /1.8, PA sat 50% and 52%  . Automatic implantable  cardioverter-defibrillator in situ   . Hepatitis 1962    "don't know which kind"  . CVA (cerebral vascular accident) 1992    "memory not always clear since"  . Chronic mid back pain   . AV node dysfunction     a) AV node ablation in 2010 and repeat 2015  . CHF (congestive heart failure)   . History of blood transfusion     "w/pregnancy; maybe last time I was in hospital" (06/14/2014)  . DJD (degenerative joint disease)     knees; right hip  . Arthritis     "knees, back" (06/14/2014)    ROS:   All systems reviewed and negative except as noted in the HPI.   Past Surgical History  Procedure Laterality Date  . Bi-ventricular implantable cardioverter defibrillator  (crt-d)  4/09; 12/13/2013    MDT CRTD  implanted by Dr Ladona Ridgelaylor; gen change to MDT CRTD 4-15 by Dr Ladona Ridgelaylor  . Cataract extraction w/ intraocular lens implant Right ~ 2009  . Abdominal hysterectomy      "to remove tumors"  . Av node ablation  10/2008; 2015    Hattie Perch/notes 06/14/2014  . Cardiac catheterization  01/2009; 09/07/2001    Hattie Perch/notes 01/01/2011; Hattie Perch/notes 01/14/2011  . Tee without cardioversion N/A 06/19/2014    Procedure: TRANSESOPHAGEAL ECHOCARDIOGRAM (TEE);  Surgeon: Donato SchultzMark Skains, MD;  [facility];  Service: Cardiovascular;  Laterality: N/A;  . Cardioversion N/A 06/19/2014    Procedure: CARDIOVERSION;  Surgeon: Donato SchultzMark Skains, MD;  [facility];  Service: Cardiovascular;  Laterality: N/A;     Family History  Problem Relation Age of Onset  . Diabetes Brother   . Heart attack Sister 449    also has liver problems   . Heart attack Mother     deceased     History   Social History  . Marital Status: Married    Spouse Name: N/A    Number of Children: N/A  . Years of Education: N/A   Occupational History  . Not on file.   Social History Main Topics  . Smoking status: Never Smoker   . Smokeless tobacco: Never Used  . Alcohol Use: No  . Drug Use: No  . Sexual Activity: No   Other Topics Concern  . Not on file   Social History Narrative   Married, retired, does not get regular exercise, no caffeine.      BP 100/56 mmHg  Pulse 60  Ht 5\' 8"  (1.727 m)  Wt 181 lb (82.101 kg)  BMI 27.53 kg/m2  Physical Exam:  Well appearing 80 year old woman,NAD HEENT: Unremarkable Neck:  6 cm JVD, no thyromegally Back:  No CVA tenderness Lungs:  Clear with no wheezes, rales, or rhonchi. HEART:  IRegular rate rhythm, no murmurs, no rubs, no clicks Abd:  soft, positive bowel sounds, no organomegally, no rebound, no guarding Ext:  2 plus pulses, no edema, no cyanosis, no clubbing Skin:  No rashes no nodules Neuro:  CN II through XII intact, motor grossly intact   DEVICE  Her LV lead will no longer capture. All  configurations were checked.   Assess/Plan:

## 2014-08-03 NOTE — Patient Instructions (Addendum)
Your physician recommends that you schedule a follow-up appointment  WITH DR. Ladona Ridgel AFTER YOUR PROCEDURE   LEAD REVISION INSTRUCTIONS ATTACHED  Your physician recommends that you return for lab work in: 2 WEEKS BEFORE YOUR PROCEDURE   Thank you for choosing [facility]!!

## 2014-08-03 NOTE — Patient Instructions (Signed)
IV Lasix at home for 2 days with [facility]   Your physician recommends that you schedule a follow-up appointment in: 3 weeks with Dr Gala Romney

## 2014-08-04 ENCOUNTER — Encounter: Payer: Self-pay | Admitting: Internal Medicine

## 2014-08-04 LAB — PACEMAKER DEVICE OBSERVATION

## 2014-08-07 ENCOUNTER — Telehealth (HOSPITAL_COMMUNITY): Payer: Self-pay

## 2014-08-07 DIAGNOSIS — I5023 Acute on chronic systolic (congestive) heart failure: Secondary | ICD-10-CM

## 2014-08-07 MED ORDER — POTASSIUM CHLORIDE CRYS ER 20 MEQ PO TBCR: 20.0000 meq | EXTENDED_RELEASE_TABLET | Freq: Every day | ORAL | Status: DC

## 2014-08-07 MED ORDER — POTASSIUM CHLORIDE CRYS ER 20 MEQ PO TBCR: 40.0000 meq | EXTENDED_RELEASE_TABLET | Freq: Four times a day (QID) | ORAL | Status: DC

## 2014-08-07 MED ORDER — TORSEMIDE 20 MG PO TABS: 40.0000 mg | ORAL_TABLET | Freq: Two times a day (BID) | ORAL | Status: DC

## 2014-08-07 NOTE — Telephone Encounter (Signed)
12/4 Potassium level 3.0, per Amy Clegg NP-C, increase potassium to 4 times daily.  Will have HH recheck BMET this week as well.  Aware and agreeable.  Rx updated to preferred pharmacy electronically. [facility]

## 2014-08-09 ENCOUNTER — Encounter: Payer: Self-pay | Admitting: Internal Medicine

## 2014-08-09 ENCOUNTER — Telehealth (HOSPITAL_COMMUNITY): Payer: Self-pay | Admitting: Vascular Surgery

## 2014-08-09 NOTE — Telephone Encounter (Signed)
Reviewed all info with Dr Gala Romney, pt's kidney fx continues to worsen (12/4 bun 72, cr 3.99)

## 2014-08-09 NOTE — Telephone Encounter (Signed)
Per Dr Gala Romney give pt 80 mg IV Lasix, 5 mg metolazone and extra 40 meq of KCL on Thur 12/10, Angie is aware and order faxed to [facility]

## 2014-08-09 NOTE — Telephone Encounter (Signed)
Spoke w/Angie she states pt continues to declince, her urine output has dropped to [phone] mL daily and last week it was 1400-1800

## 2014-08-09 NOTE — Telephone Encounter (Signed)
Nurse from [facility] called Pt weight is up 190.4.Marland Kitchen

## 2014-08-10 ENCOUNTER — Telehealth (HOSPITAL_COMMUNITY): Payer: Self-pay | Admitting: Vascular Surgery

## 2014-08-10 ENCOUNTER — Encounter (HOSPITAL_COMMUNITY): Payer: Self-pay | Admitting: Internal Medicine

## 2014-08-10 NOTE — Telephone Encounter (Signed)
Received labs from [facility] bun 89 cr 4.21 K 5.5 per Dr Gala Romney have pt hold all potassium for now, continue current order for IV Lasix 80 mg today, repeat bmet on Mon.  He will call and discuss w/pt and husband this afternoon, kidney function continues to worsen, we have 3 options: continue [facility], admit to hospital or refer to hospice.  Angie, RN with [facility] is aware and will have pt hold potassium for now

## 2014-08-11 ENCOUNTER — Telehealth (HOSPITAL_COMMUNITY): Payer: Self-pay | Admitting: Vascular Surgery

## 2014-08-11 NOTE — Telephone Encounter (Signed)
Dr Gala Romney is aware and is going to call and discuss options with pt

## 2014-08-11 NOTE — Telephone Encounter (Signed)
Dr.Bensimhon spoke with pt regarding returning to ER for further management of CHF or hospice Pt will discuss options with family and call CHF clinic or report to ER  No medication adjustments were done during this phone encounter   Angie aware as well

## 2014-08-11 NOTE — Telephone Encounter (Signed)
Weight up .4 today.. 192.8 .Marland Kitchen PT got IV Lasix 80 yesterday.Marland Kitchen Extra Metolazone yesterday... Pt weight has been going up everyday... Please advise

## 2014-08-11 NOTE — Telephone Encounter (Signed)
[facility] called they need clarification on the pt torsemide 20 mg tabs... Ref # [id number].Marland Kitchen Please respond by 08/15/14 per Margarat

## 2014-08-14 ENCOUNTER — Telehealth (HOSPITAL_COMMUNITY): Payer: Self-pay | Admitting: Vascular Surgery

## 2014-08-14 DIAGNOSIS — I5023 Acute on chronic systolic (congestive) heart failure: Secondary | ICD-10-CM

## 2014-08-14 MED ORDER — POTASSIUM CHLORIDE CRYS ER 20 MEQ PO TBCR: 40.0000 meq | EXTENDED_RELEASE_TABLET | Freq: Two times a day (BID) | ORAL | Status: AC

## 2014-08-14 NOTE — Telephone Encounter (Signed)
Received labs from [facility] bun 91, cr 4.39, K 2.2 per Dr Gala Romney have pt take 80 meq of KCL today then 40 meq Twice daily, Angie and pt's husband both aware and agreeable

## 2014-08-14 NOTE — Telephone Encounter (Signed)
Nurse from [facility] called pt [name] 2.2 pt had held her [name] since Thursday.. Please advise

## 2014-08-15 ENCOUNTER — Encounter: Payer: Self-pay | Admitting: Internal Medicine

## 2014-08-15 ENCOUNTER — Telehealth (HOSPITAL_COMMUNITY): Payer: Self-pay | Admitting: Vascular Surgery

## 2014-08-15 NOTE — Telephone Encounter (Signed)
Pt should not be on torsemide Fax was [facility] on 08/11/14, with orders to d/c or void  [facility]bal given [facility] to [facility] for torsemide

## 2014-08-15 NOTE — Telephone Encounter (Signed)
Spoke w/Angie, she is seeing pt again tomorrow to repeat labs and will let us know if wt has increased

## 2014-08-15 NOTE — Telephone Encounter (Signed)
Update from Angie.. Over the weekend 190 , yesterday 194.4.Marland Kitchen Today 194.0.. increased Milrone 56ml per hour.Marland KitchenMarland Kitchen

## 2014-08-16 ENCOUNTER — Encounter: Payer: Self-pay | Admitting: Internal Medicine

## 2014-08-17 ENCOUNTER — Telehealth (HOSPITAL_COMMUNITY): Payer: Self-pay | Admitting: Vascular Surgery

## 2014-08-17 NOTE — Telephone Encounter (Signed)
Spoke w/[name] she will get labs Mon and pt is sch to see [place] on Wed

## 2014-08-17 NOTE — Telephone Encounter (Addendum)
[facility] nurse called .Marland Kitchen Update pt weight 195.. Status unchanged edema in legs, no more SOB than usual.. Nurse saw her 3 time this week, Next week she want to do labs on Monday and Milrone bag change on Wed before her appt.. Please call Angie to confirm make sure this ok.. Please advise BUN 88 CREAT 3.72

## 2014-08-22 ENCOUNTER — Encounter (HOSPITAL_COMMUNITY): Payer: Self-pay | Admitting: Pharmacy Technician

## 2014-08-22 ENCOUNTER — Telehealth (HOSPITAL_COMMUNITY): Payer: Self-pay | Admitting: Vascular Surgery

## 2014-08-22 NOTE — Telephone Encounter (Signed)
Spoke w/Miranda, she states due to kidney function they should decrease dose, pt is sch to see [place] tomorrow will discuss w/Dr Bensimhon then and let her know

## 2014-08-22 NOTE — Telephone Encounter (Signed)
[facility] called pt Kidney function is still down.. Nurse want to know if you want to decrease her Milrnone 0.2 microgram per min.Marland Kitchen Please advise

## 2014-08-23 ENCOUNTER — Ambulatory Visit (HOSPITAL_COMMUNITY)
Admission: RE | Admit: 2014-08-23 | Discharge: 2014-08-23 | Disposition: A | Discharge status: Home / Self Care | Payer: Medicare HMO | Source: Ambulatory Visit | Attending: Internal Medicine | Admitting: Internal Medicine

## 2014-08-23 ENCOUNTER — Encounter (HOSPITAL_COMMUNITY): Payer: Self-pay

## 2014-08-23 VITALS — BP 101/54 | HR 61 | Resp 20 | Wt 177.5 lb

## 2014-08-23 DIAGNOSIS — N189 Chronic kidney disease, unspecified: Secondary | ICD-10-CM

## 2014-08-23 DIAGNOSIS — Z7901 Long term (current) use of anticoagulants: Secondary | ICD-10-CM | POA: Diagnosis not present

## 2014-08-23 DIAGNOSIS — I2510 Atherosclerotic heart disease of native coronary artery without angina pectoris: Secondary | ICD-10-CM | POA: Diagnosis not present

## 2014-08-23 DIAGNOSIS — Z8673 Personal history of transient ischemic attack (TIA), and cerebral infarction without residual deficits: Secondary | ICD-10-CM | POA: Insufficient documentation

## 2014-08-23 DIAGNOSIS — I482 Chronic atrial fibrillation: Secondary | ICD-10-CM | POA: Diagnosis not present

## 2014-08-23 DIAGNOSIS — M109 Gout, unspecified: Secondary | ICD-10-CM | POA: Diagnosis not present

## 2014-08-23 DIAGNOSIS — N184 Chronic kidney disease, stage 4 (severe): Secondary | ICD-10-CM | POA: Diagnosis not present

## 2014-08-23 DIAGNOSIS — E119 Type 2 diabetes mellitus without complications: Secondary | ICD-10-CM | POA: Insufficient documentation

## 2014-08-23 DIAGNOSIS — Z95810 Presence of automatic (implantable) cardiac defibrillator: Secondary | ICD-10-CM | POA: Insufficient documentation

## 2014-08-23 DIAGNOSIS — I129 Hypertensive chronic kidney disease with stage 1 through stage 4 chronic kidney disease, or unspecified chronic kidney disease: Secondary | ICD-10-CM | POA: Diagnosis not present

## 2014-08-23 DIAGNOSIS — I5022 Chronic systolic (congestive) heart failure: Secondary | ICD-10-CM | POA: Diagnosis not present

## 2014-08-23 DIAGNOSIS — Z79899 Other long term (current) drug therapy: Secondary | ICD-10-CM | POA: Insufficient documentation

## 2014-08-23 DIAGNOSIS — I429 Cardiomyopathy, unspecified: Secondary | ICD-10-CM | POA: Diagnosis not present

## 2014-08-23 DIAGNOSIS — I4891 Unspecified atrial fibrillation: Secondary | ICD-10-CM

## 2014-08-23 DIAGNOSIS — N179 Acute kidney failure, unspecified: Secondary | ICD-10-CM

## 2014-08-23 DIAGNOSIS — I5023 Acute on chronic systolic (congestive) heart failure: Secondary | ICD-10-CM

## 2014-08-23 MED ORDER — HEPARIN SOD (PORK) LOCK FLUSH 100 UNIT/ML IV SOLN
INTRAVENOUS | Status: AC
  Filled 2014-08-23: qty 5

## 2014-08-23 MED ORDER — ALTEPLASE 100 MG IV SOLR
2.0000 mg | Freq: Once | INTRAVENOUS | Status: DC
Start: 2014-08-23 — End: 2014-08-24
  Filled 2014-08-23: qty 2

## 2014-08-23 NOTE — Patient Instructions (Signed)
Will have [facility] administer IV lasix in the home for 2 days.  Will cancel ICD exchange next month for now.  Follow up with [place] in 3-4 weeks.  Merry Christmas and Happy New Year!  Do the following things EVERYDAY: 1) Weigh yourself in the morning before breakfast. Write it down and keep it in a log. 2) Take your medicines as prescribed 3) Eat low salt foods-Limit salt (sodium) to 2000 mg per day.  4) Stay as active as you can everyday 5) Limit all fluids for the day to less than 2 liters

## 2014-08-23 NOTE — Progress Notes (Signed)
Pt came in for TPA of PICC line (red port).  Flushed with 10cc NS, good blood return, then Heparin 500 units instilled.  TPA not needed

## 2014-08-23 NOTE — Progress Notes (Signed)
Patient ID: [name], female   DOB: Oct 16, 1932, 80 y.o.   MRN: [id number] PCP: Dr. [name] Primary Cardiologist: Dr. Ladona Ridgel  HPI: [name] is a 80 yo female with a history of NICM s/p BiV ICD, HTN, chronic systolic HF, permanent afib (s/p AVN ablation 2010 and again repeat 2015), CVA, DM2 and CKD stage IV.  ECHO (04/2014): EF 15-20%, grade III DD, severe MR, biatrial enlargement, mod TR  Admitted 04/2014 with increased SOB and fatigue. ICD interrogated and showed 1/3 of ventricular beats actually conducted through AV node limiting BiV pacing and underwent repeat AV node ablation. Had RHC showing low output and started on milrinone. Failed milrinone wean. Loaded with amiodarone for potential DC-CV.   Admitted to [facility] 10/14 with increased dyspnea. She continued on milrinone and diuresed with IV lasix. She also had TEE/DC-CV for ongoing A fib. This was successful. She was discharge increased oral diuretics and hydralazine. Overall she diuresed 24 pounds. Discharge weight 177 pounds.   Follow up visit: Has been struggling at home since last visit with volume overload and has received IV lasix multiple times. PICC is clotted and will be going to Short Stay today to get TPA. DOE with minimal exertion such as doing ADLs. +orthopnea. Denies CP or PND. Weight at home 192-195 lbs. +LE edema.   Labs (9/15): K 3.0, creatinine 2.07 => 1.9, HCT 32.7 AST 38 ALT 29 TSH normal  Labs 06/06/14: K 3.9 Creatinine 2.73---> lasix held for 2 days. Labs 06/22/14: K 3.1 Creatinine 2.05  Labs 07/05/14: K 4.9, creatinine 2.98, BUN 45 Labs 07/07/14: K 5.2, creatinine 3.71, BUN 47  Labs 07/19/14: K 3.7 Creatinine 2.8  Labs 07/26/14: K 3.6 Creatinine 2.86 BUN 56 Labs 08/14/14: K2.2, creatinine 4.21, BUN 91 Labs 08/17/14 BUN 88, Creatinine 3.72 Labs 08/23/14:  K 4.2 Creatinine 4.31 BUN 93  ICD interrogation: No AT/VF. Volume well over threshold but coming down slightly. Activity level ~0    ROS: All  systems negative except as listed in HPI, PMH and Problem List.  SH:  History   Social History  . Marital Status: Married    Spouse Name: N/A    Number of Children: N/A  . Years of Education: N/A   Occupational History  . Not on file.   Social History Main Topics  . Smoking status: Never Smoker   . Smokeless tobacco: Never Used  . Alcohol Use: No  . Drug Use: No  . Sexual Activity: No   Other Topics Concern  . Not on file   Social History Narrative   Married, retired, does not get regular exercise, no caffeine.     FH:  Family History  Problem Relation Age of Onset  . Diabetes Brother   . Heart attack Sister 66    also has liver problems   . Heart attack Mother     deceased    Past Medical History  Diagnosis Date  . Nonischemic cardiomyopathy     a) Initial dx 1992 b) nl coronary angios in 1/03 c) AICD/bivent. PM - 4/09;    . Hypertension   . CKD (chronic kidney disease), stage IV   . PSVT (paroxysmal supraventricular tachycardia)     s/p radiofrequency ablation  . Gout     uric acid of 10 6/11  . Anemia   . Thrombus     left upper extremity; following device insertion  . Chronic anticoagulation   . Coronary artery disease   . Chronic systolic heart  failure     a) NICM b) EF 40-45% (01/2009) c) EF 20-25% (04/2014) d) RHC (04/2014): RA 9, RV 34/2/6, PA 34/16 (24), PCWP: 12, Fick CO/CI: 3.6 /1.8, PA sat 50% and 52%  . Automatic implantable cardioverter-defibrillator in situ   . Hepatitis 1962    "don't know which kind"  . CVA (cerebral vascular accident) 1992    "memory not always clear since"  . Chronic mid back pain   . AV node dysfunction     a) AV node ablation in 2010 and repeat 2015  . CHF (congestive heart failure)   . History of blood transfusion     "w/pregnancy; maybe last time I was in hospital" (06/14/2014)  . DJD (degenerative joint disease)     knees; right hip  . Arthritis     "knees, back" (06/14/2014)    Current Outpatient  Prescriptions  Medication Sig Dispense Refill  . allopurinol (ZYLOPRIM) 100 MG tablet Take 1 tablet (100 mg total) by mouth daily. 90 tablet 3  . amiodarone (PACERONE) 200 MG tablet Take 1 tablet (200 mg total) by mouth 2 (two) times daily. 180 tablet 3  . atorvastatin (LIPITOR) 20 MG tablet Take 1 tablet (20 mg total) by mouth daily. 90 tablet 3  . colchicine 0.6 MG tablet Take 0.5 tablets (0.3 mg total) by mouth daily. (Patient taking differently: Take 0.6 mg by mouth daily. ) 15 tablet 1  . ferrous sulfate 325 (65 FE) MG tablet Take 1 tablet (325 mg total) by mouth 2 (two) times daily with a meal. 60 tablet 0  . furosemide (LASIX) 40 MG tablet Take 2 tabs in AM, [facility] and 1 tab in PM (Patient taking differently: Take 80 mg by mouth 2 (two) times daily. ) 360 tablet 3  . hydrALAZINE (APRESOLINE) 25 MG tablet Take 1.5 tablets (37.5 mg total) by mouth 3 (three) times daily. 270 tablet 6  . isosorbide mononitrate (IMDUR) 60 MG 24 hr tablet Take 1 tablet (60 mg total) by mouth daily. 90 tablet 1  . metolazone (ZAROXOLYN) 5 MG tablet Take one tab po on MONDAYS and FRIDAYS or as directed by provider (Patient taking differently: Take 5 mg by mouth See admin instructions. Take one tab po on MONDAYS and FRIDAYS or as directed by provider) 90 tablet 3  . milrinone (PRIMACOR) 20 MG/100ML SOLN infusion Inject 30.2625 mcg/min into the vein continuous. 0.25 mcg/kg/min 100 mL 0  . Multiple Vitamin (MULTIVITAMIN) tablet Take 0.5 tablets by mouth daily.     . nitroGLYCERIN (NITROSTAT) 0.4 MG SL tablet Place 0.4 mg under the tongue every 5 (five) minutes as needed for chest pain.     . Omega-3 Fatty Acids (FISH OIL) 1000 MG CAPS Take 1 capsule by mouth daily.    . potassium chloride SA (K-DUR,KLOR-CON) 20 MEQ tablet Take 2 tablets (40 mEq total) by mouth 2 (two) times daily. Take additional 20 MEQ of Potassium on Mondays and Fridays with Metolazone (Patient taking differently: Take 20 mEq by mouth 2 (two)  times daily. Take additional 20 MEQ of Potassium on Mondays and Fridays with Metolazone) 580 tablet 3  . warfarin (COUMADIN) 2.5 MG tablet Take 1 tablet per day on Sun Tue Wed Thu Sat and 2 tabs per day on Mon & Fri. 60 tablet 0   No current facility-administered medications for this encounter.   Facility-Administered Medications Ordered in Other Encounters  Medication Dose Route Frequency Provider Last Rate Last Dose  . alteplase (ACTIVASE)  injection 2 mg  2 mg Intracatheter Once Dolores Patty, MD        Filed Vitals:   08/23/14 0957  BP: 101/54  Pulse: 61  Resp: 20  Weight: 177 lb 8 oz (80.513 kg)  SpO2: 93%    PHYSICAL EXAM:  General: Chronically ill appearing. No resp difficulty, sitting in wheelchair. Husband present HEENT: normal Neck: supple. JVP to jaw. Carotids 2+ bilaterally; no bruits. No lymphadenopathy or thryomegaly appreciated. Cor: PMI normal. Regular rate & rhythm. 2/6 SEM RUSB Loud s3 Lungs: diminished in the bases Abdomen: soft, nontender, nondistended. No hepatosplenomegaly. No bruits or masses. Good bowel sounds. Extremities: no cyanosis, clubbing, rash.  R and LLE 2+ edema.  Neuro: alert & orientedx3, cranial nerves grossly intact. Moves all 4 extremities w/o difficulty. Affect pleasant.   ASSESSMENT & PLAN: 1) Chronic systolic HF, end stage: Nonischemic CMP.  EF 20-25% (04/2014).  She has BiV-ICD and is s/p AV nodal ablation to promote BiV pacing. Maintaining NSR  Volume status up again. Not responding as well to metolazone. Will give lasix 80 mg IV for 2 days and see if that helps reset her. Will also give her two of our tables of metolazone that worked in the past and see if there are any differences in formulations.  Increase lasix 80 mg bid Continue metolazone 5 mg every Monday and Friday and as needed.  Continue 40 meq of potassium three times a day an extra 20 meq of potassium on Mon and Friday with metolazone.  Continue hydralazine 37.5 mg three  times a day/ 60 mg imdur daily. BP too low to titrate Check labs again tomorrow with [facility] does not want Hospice as [facility] really helping 2) Afib: s/p AV node ablation 2010 and repeat 2015.  Had succesful  DC-CV 06/19/14. Continue amiodarone 200 mg twice a day. INR per Dr Megan Mans. 3) Acute on CKD stage IV: Renal function stable. Not HD candidate  Follow up in 3 weeks.    Aundria Rud, NP-C 10:13 AM   Patient seen and examined with Ulla Potash, NP. We discussed all aspects of the encounter. I agree with the assessment and plan as stated above.   She remains very tenuous with end-stage HF. Volume status up but trending down slightly. She is comfortable and does not want admission or Hospice at this point though both her and her husband realize she will likely die within weeks to months.. Will give lasix 80 IV with [facility] for 3 days. Otherwise continue current regimen. Would not replace ICD generator at this point.   ICD interrogated personally and reviewed with them.  Erynn Vaca,MD 5:15 PM

## 2014-08-24 NOTE — Telephone Encounter (Signed)
No changes per Dr Gala Romney, pharmacy aware

## 2014-08-28 ENCOUNTER — Telehealth (HOSPITAL_COMMUNITY): Payer: Self-pay | Admitting: Vascular Surgery

## 2014-08-28 ENCOUNTER — Other Ambulatory Visit (HOSPITAL_COMMUNITY): Payer: Self-pay

## 2014-08-28 DIAGNOSIS — I5023 Acute on chronic systolic (congestive) heart failure: Secondary | ICD-10-CM

## 2014-08-28 MED ORDER — FUROSEMIDE 40 MG PO TABS: 80.0000 mg | ORAL_TABLET | Freq: Every day | ORAL | Status: DC

## 2014-08-28 MED ORDER — FUROSEMIDE 40 MG PO TABS: ORAL_TABLET | ORAL | Status: AC

## 2014-08-28 NOTE — Telephone Encounter (Signed)
Called patient and husband to verify patient's weight the past few days and medication regimen.  Per [facility] RN, patient was not taking lasix.  But per husband who helps with her meds, she is in fact taking lasix 80mg  in am, 80mg  at lunch, 40mg  at night.  Her weight for our records indicated that she was 177lbs on the 23rd, however her weight at home runs in the 190s always.  Today was 201 and seems to be going up 1 lb each day.  Patient's husband states she takes her metolazone at night time.  Instructed to start taking it 30 minutes before 80mg  lasix dosage at lunchtime.  Will call us tomorrow with weight update to see if this helped.  Aware and agreeable.  Ave Filter

## 2014-08-28 NOTE — Telephone Encounter (Signed)
Per Dr. Shirlee Latch resume lasix 80mg  PO twice daily.  RN and patient made aware.  Ave Filter

## 2014-08-28 NOTE — Telephone Encounter (Signed)
[facility] from [facility] called pt Weight 201.Marland Kitchen Please advise

## 2014-08-30 ENCOUNTER — Telehealth (HOSPITAL_COMMUNITY): Payer: Self-pay | Admitting: Vascular Surgery

## 2014-08-31 ENCOUNTER — Encounter (HOSPITAL_COMMUNITY): Payer: Self-pay | Admitting: Adult Health

## 2014-08-31 ENCOUNTER — Encounter (HOSPITAL_COMMUNITY): Payer: Medicare HMO

## 2014-08-31 ENCOUNTER — Other Ambulatory Visit (HOSPITAL_COMMUNITY): Payer: Self-pay | Admitting: Adult Health

## 2014-08-31 ENCOUNTER — Telehealth (HOSPITAL_COMMUNITY): Payer: Self-pay | Admitting: Vascular Surgery

## 2014-08-31 NOTE — Telephone Encounter (Signed)
Returned phone call.   Can have extra 80 mg of lasix at home.   [facility] aware.   CLEGG,AMY NP-C  12:30 PM

## 2014-08-31 NOTE — Telephone Encounter (Signed)
Encounter open in error 

## 2014-08-31 NOTE — Telephone Encounter (Signed)
Holly from [facility] called she is callinG about a prescription 80 mg IV X 1..Holly want to know if she can dispence more than 1 vial so the pt can have lasix in the home. [phone] ext [phone]

## 2014-08-31 NOTE — Progress Notes (Signed)
Patient ID: [name], female   DOB: [birth year], 80 y.o.   MRN: [id number]    08/28/14 K 4.1 Creatinine 4.96 on Milrinone 0.375.  Contacted HHRN Angie and she reports . Weight at home trending up and increased dyspnea noted.   I called her husband and he says weight at home continues to trend up. Now weight is up to 200 pounds. Mr Bartholf says his wife wants is having increased SOB but refuses to return to the hospital.   I explained that her time is limited as diuretics and inotropes are not working. He verbalized understanding.   Will give one dose of 80 mg IV lasix to see if this helps dyspnea. Encouraged to return to ED if she continues to decline.   CLEGG,AMY NP-C  11:15 AM

## 2014-09-04 ENCOUNTER — Telehealth (HOSPITAL_COMMUNITY): Payer: Self-pay | Admitting: Vascular Surgery

## 2014-09-04 NOTE — Telephone Encounter (Signed)
Weight 207.4 please advise.Marland Kitchen

## 2014-09-04 NOTE — Telephone Encounter (Signed)
Per Dr. Gala Romney, patient to receive 80mg  IV lasix x 1 dose in the home for the next 3 days.  Order signed and faxed to [facility] pharmacy.  Ave Filter

## 2014-09-06 ENCOUNTER — Ambulatory Visit (HOSPITAL_COMMUNITY): Admission: RE | Admit: 2014-09-06 | Payer: Medicare HMO | Source: Ambulatory Visit | Admitting: Internal Medicine

## 2014-09-06 ENCOUNTER — Encounter (HOSPITAL_COMMUNITY): Payer: Self-pay | Admitting: Emergency Medicine

## 2014-09-06 ENCOUNTER — Encounter (HOSPITAL_COMMUNITY): Admission: RE | Payer: Self-pay | Source: Ambulatory Visit

## 2014-09-06 ENCOUNTER — Telehealth (HOSPITAL_COMMUNITY): Payer: Self-pay | Admitting: Vascular Surgery

## 2014-09-06 ENCOUNTER — Inpatient Hospital Stay (HOSPITAL_COMMUNITY)
Admission: EM | Admit: 2014-09-06 | Discharge: 2014-10-02 | DRG: 291 | Disposition: E | Payer: Medicare HMO | Attending: Internal Medicine | Admitting: Internal Medicine

## 2014-09-06 ENCOUNTER — Emergency Department (HOSPITAL_COMMUNITY): Payer: Medicare HMO

## 2014-09-06 DIAGNOSIS — Z7901 Long term (current) use of anticoagulants: Secondary | ICD-10-CM | POA: Diagnosis not present

## 2014-09-06 DIAGNOSIS — I2510 Atherosclerotic heart disease of native coronary artery without angina pectoris: Secondary | ICD-10-CM | POA: Diagnosis present

## 2014-09-06 DIAGNOSIS — J9622 Acute and chronic respiratory failure with hypercapnia: Secondary | ICD-10-CM | POA: Diagnosis present

## 2014-09-06 DIAGNOSIS — Z95810 Presence of automatic (implantable) cardiac defibrillator: Secondary | ICD-10-CM | POA: Diagnosis not present

## 2014-09-06 DIAGNOSIS — I472 Ventricular tachycardia: Secondary | ICD-10-CM | POA: Diagnosis not present

## 2014-09-06 DIAGNOSIS — M1990 Unspecified osteoarthritis, unspecified site: Secondary | ICD-10-CM | POA: Diagnosis present

## 2014-09-06 DIAGNOSIS — I480 Paroxysmal atrial fibrillation: Secondary | ICD-10-CM | POA: Diagnosis present

## 2014-09-06 DIAGNOSIS — J9621 Acute and chronic respiratory failure with hypoxia: Secondary | ICD-10-CM | POA: Diagnosis present

## 2014-09-06 DIAGNOSIS — I130 Hypertensive heart and chronic kidney disease with heart failure and stage 1 through stage 4 chronic kidney disease, or unspecified chronic kidney disease: Secondary | ICD-10-CM | POA: Diagnosis present

## 2014-09-06 DIAGNOSIS — I429 Cardiomyopathy, unspecified: Secondary | ICD-10-CM | POA: Diagnosis present

## 2014-09-06 DIAGNOSIS — N179 Acute kidney failure, unspecified: Secondary | ICD-10-CM | POA: Diagnosis present

## 2014-09-06 DIAGNOSIS — R0602 Shortness of breath: Secondary | ICD-10-CM

## 2014-09-06 DIAGNOSIS — Z66 Do not resuscitate: Secondary | ICD-10-CM | POA: Diagnosis present

## 2014-09-06 DIAGNOSIS — R570 Cardiogenic shock: Secondary | ICD-10-CM | POA: Diagnosis present

## 2014-09-06 DIAGNOSIS — Z8673 Personal history of transient ischemic attack (TIA), and cerebral infarction without residual deficits: Secondary | ICD-10-CM

## 2014-09-06 DIAGNOSIS — I5023 Acute on chronic systolic (congestive) heart failure: Principal | ICD-10-CM | POA: Insufficient documentation

## 2014-09-06 DIAGNOSIS — J9620 Acute and chronic respiratory failure, unspecified whether with hypoxia or hypercapnia: Secondary | ICD-10-CM | POA: Insufficient documentation

## 2014-09-06 DIAGNOSIS — N184 Chronic kidney disease, stage 4 (severe): Secondary | ICD-10-CM | POA: Diagnosis present

## 2014-09-06 DIAGNOSIS — E872 Acidosis: Secondary | ICD-10-CM | POA: Diagnosis present

## 2014-09-06 DIAGNOSIS — I482 Chronic atrial fibrillation: Secondary | ICD-10-CM | POA: Diagnosis present

## 2014-09-06 DIAGNOSIS — R34 Anuria and oliguria: Secondary | ICD-10-CM | POA: Diagnosis not present

## 2014-09-06 DIAGNOSIS — E875 Hyperkalemia: Secondary | ICD-10-CM | POA: Diagnosis present

## 2014-09-06 DIAGNOSIS — N189 Chronic kidney disease, unspecified: Secondary | ICD-10-CM

## 2014-09-06 DIAGNOSIS — Z515 Encounter for palliative care: Secondary | ICD-10-CM | POA: Diagnosis not present

## 2014-09-06 LAB — POCT I-STAT 3, VENOUS BLOOD GAS (G3P V)
Acid-base deficit: 11 mmol/L — ABNORMAL HIGH (ref 0.0–2.0)
Bicarbonate: 13 mEq/L — ABNORMAL LOW (ref 20.0–24.0)
O2 SAT: 71 %
TCO2: 14 mmol/L (ref 0–100)
pCO2, Ven: 24 mmHg — ABNORMAL LOW (ref 45.0–50.0)
pH, Ven: 7.34 — ABNORMAL HIGH (ref 7.250–7.300)
pO2, Ven: 38 mmHg (ref 30.0–45.0)

## 2014-09-06 LAB — URINE MICROSCOPIC-ADD ON

## 2014-09-06 LAB — URINALYSIS, ROUTINE W REFLEX MICROSCOPIC
Glucose, UA: NEGATIVE mg/dL
Hgb urine dipstick: NEGATIVE
KETONES UR: NEGATIVE mg/dL
Nitrite: NEGATIVE
Protein, ur: 100 mg/dL — AB
Specific Gravity, Urine: 1.016 (ref 1.005–1.030)
UROBILINOGEN UA: 0.2 mg/dL (ref 0.0–1.0)
pH: 5 (ref 5.0–8.0)

## 2014-09-06 LAB — CBC
HEMATOCRIT: 35.9 % — AB (ref 36.0–46.0)
HEMOGLOBIN: 12.2 g/dL (ref 12.0–15.0)
MCH: 29.8 pg (ref 26.0–34.0)
MCHC: 34 g/dL (ref 30.0–36.0)
MCV: 87.6 fL (ref 78.0–100.0)
Platelets: 278 10*3/uL (ref 150–400)
RBC: 4.1 MIL/uL (ref 3.87–5.11)
RDW: 16.2 % — AB (ref 11.5–15.5)
WBC: 6.7 10*3/uL (ref 4.0–10.5)

## 2014-09-06 LAB — BASIC METABOLIC PANEL
Anion gap: 21 — ABNORMAL HIGH (ref 5–15)
BUN: 107 mg/dL — AB (ref 6–23)
CALCIUM: 9.8 mg/dL (ref 8.4–10.5)
CO2: 14 mmol/L — AB (ref 19–32)
CREATININE: 6.78 mg/dL — AB (ref 0.50–1.10)
Chloride: 102 mEq/L (ref 96–112)
GFR calc Af Amer: 6 mL/min — ABNORMAL LOW (ref >90)
GFR, EST NON AFRICAN AMERICAN: 5 mL/min — AB (ref >90)
Glucose, Bld: 108 mg/dL — ABNORMAL HIGH (ref 70–99)
Potassium: 5.9 mmol/L — ABNORMAL HIGH (ref 3.5–5.1)
SODIUM: 137 mmol/L (ref 135–145)

## 2014-09-06 LAB — SODIUM, URINE, RANDOM: Sodium, Ur: <10 mmol/L

## 2014-09-06 LAB — PROTIME-INR
INR: 3.02 — ABNORMAL HIGH (ref 0.00–1.49)
PROTHROMBIN TIME: 31.6 s — AB (ref 11.6–15.2)

## 2014-09-06 LAB — BRAIN NATRIURETIC PEPTIDE: B NATRIURETIC PEPTIDE 5: 1185.7 pg/mL — AB (ref 0.0–100.0)

## 2014-09-06 LAB — I-STAT TROPONIN, ED: TROPONIN I, POC: 0.16 ng/mL — AB (ref 0.00–0.08)

## 2014-09-06 LAB — CREATININE, URINE, RANDOM: Creatinine, Urine: 136.79 mg/dL

## 2014-09-06 LAB — I-STAT CG4 LACTIC ACID, ED: Lactic Acid, Venous: 5.21 mmol/L — ABNORMAL HIGH (ref 0.5–2.2)

## 2014-09-06 LAB — TROPONIN I
TROPONIN I: 0.22 ng/mL — AB (ref <0.031)
Troponin I: 0.24 ng/mL — ABNORMAL HIGH (ref <0.031)

## 2014-09-06 SURGERY — LEAD REVISION
Anesthesia: LOCAL

## 2014-09-06 MED ORDER — ASPIRIN 81 MG PO CHEW: 324.0000 mg | CHEWABLE_TABLET | ORAL | Status: AC

## 2014-09-06 MED ORDER — AMIODARONE HCL 200 MG PO TABS
200.0000 mg | ORAL_TABLET | Freq: Two times a day (BID) | ORAL | Status: DC
  Administered 2014-09-06: 200 mg via ORAL
  Filled 2014-09-06 (×2): qty 1

## 2014-09-06 MED ORDER — MORPHINE SULFATE 2 MG/ML IJ SOLN: 2.0000 mg | INTRAMUSCULAR | Status: DC | PRN

## 2014-09-06 MED ORDER — FUROSEMIDE 10 MG/ML IJ SOLN
160.0000 mg | Freq: Once | INTRAVENOUS | Status: DC
  Filled 2014-09-06: qty 16

## 2014-09-06 MED ORDER — FUROSEMIDE 10 MG/ML IJ SOLN
80.0000 mg | INTRAMUSCULAR | Status: AC
  Administered 2014-09-06: 80 mg via INTRAVENOUS
  Filled 2014-09-06: qty 8

## 2014-09-06 MED ORDER — SODIUM CHLORIDE 0.9 % IV SOLN
INTRAVENOUS | Status: DC
  Administered 2014-09-06: 16:00:00 via INTRAVENOUS

## 2014-09-06 MED ORDER — SODIUM BICARBONATE 8.4 % IV SOLN
50.0000 meq | Freq: Once | INTRAVENOUS | Status: AC
  Administered 2014-09-06: 50 meq via INTRAVENOUS
  Filled 2014-09-06: qty 50

## 2014-09-06 MED ORDER — ASPIRIN 300 MG RE SUPP: 300.0000 mg | RECTAL | Status: AC

## 2014-09-06 MED ORDER — ATORVASTATIN CALCIUM 20 MG PO TABS
20.0000 mg | ORAL_TABLET | Freq: Every day | ORAL | Status: DC
  Filled 2014-09-06: qty 1

## 2014-09-06 MED ORDER — SODIUM CHLORIDE 0.9 % IV SOLN: 250.0000 mL | INTRAVENOUS | Status: DC | PRN

## 2014-09-06 MED ORDER — MORPHINE SULFATE 2 MG/ML IJ SOLN
2.0000 mg | INTRAMUSCULAR | Status: DC | PRN
  Administered 2014-09-06 – 2014-09-07 (×3): 2 mg via INTRAVENOUS
  Filled 2014-09-06 (×3): qty 1

## 2014-09-06 MED ORDER — MILRINONE IN DEXTROSE 20 MG/100ML IV SOLN
0.1250 ug/kg/min | INTRAVENOUS | Status: DC
  Administered 2014-09-06 – 2014-09-08 (×3): 0.125 ug/kg/min via INTRAVENOUS
  Filled 2014-09-06 (×3): qty 100

## 2014-09-06 MED ORDER — SODIUM CHLORIDE 0.9 % IV SOLN
1.0000 g | Freq: Once | INTRAVENOUS | Status: AC
  Administered 2014-09-06: 1 g via INTRAVENOUS
  Filled 2014-09-06: qty 10

## 2014-09-06 MED ORDER — HEPARIN SODIUM (PORCINE) 5000 UNIT/ML IJ SOLN: 5000.0000 [IU] | Freq: Three times a day (TID) | INTRAMUSCULAR | Status: DC

## 2014-09-06 NOTE — Consult Note (Signed)
[name] is an 81 y.o. female referred by Dr Craige Cotta   Chief Complaint: Acute on CKD3/4, hyperkalemia HPI: 81yo BF with end stage non-ischemic cardiomyopathy admitted earlier today for worsening heart failure over last few weeks.  EF 15-20% and on continuous milrinone gtt.  Has had CKD for number of years with Scr upper 1's to low 2's with last Scr 2 in Oct '15 and today 6.78.  She is unable to tell me the dose of lasix she has been taken.  Past Medical History  Diagnosis Date  . Nonischemic cardiomyopathy     a) Initial dx 1992 b) nl coronary angios in 1/03 c) AICD/bivent. PM - 4/09;    . Hypertension   . CKD (chronic kidney disease), stage IV   . PSVT (paroxysmal supraventricular tachycardia)     s/p radiofrequency ablation  . Gout     uric acid of 10 6/11  . Anemia   . Thrombus     left upper extremity; following device insertion  . Chronic anticoagulation   . Coronary artery disease   . Chronic systolic heart failure     a) NICM b) EF 40-45% (01/2009) c) EF 20-25% (04/2014) d) RHC (04/2014): RA 9, RV 34/2/6, PA 34/16 (24), PCWP: 12, Fick CO/CI: 3.6 /1.8, PA sat 50% and 52%  . Automatic implantable cardioverter-defibrillator in situ   . Hepatitis 1962    "don't know which kind"  . CVA (cerebral vascular accident) 1992    "memory not always clear since"  . Chronic mid back pain   . AV node dysfunction     a) AV node ablation in 2010 and repeat 2015  . CHF (congestive heart failure)   . History of blood transfusion     "w/pregnancy; maybe last time I was in hospital" (06/14/2014)  . DJD (degenerative joint disease)     knees; right hip  . Arthritis     "knees, back" (06/14/2014)    Past Surgical History  Procedure Laterality Date  . Bi-ventricular implantable cardioverter defibrillator  (crt-d)  4/09; 12/13/2013    MDT CRTD implanted by Dr Ladona Ridgel; gen change to MDT CRTD 4-15 by Dr Ladona Ridgel  . Cataract extraction w/ intraocular lens implant Right ~ 2009  . Abdominal  hysterectomy      "to remove tumors"  . Av node ablation  10/2008; 2015    Hattie Perch 06/14/2014  . Cardiac catheterization  01/2009; 09/07/2001    Hattie Perch 01/01/2011; Hattie Perch 01/14/2011  . Tee without cardioversion N/A 06/19/2014    Procedure: TRANSESOPHAGEAL ECHOCARDIOGRAM (TEE);  Surgeon: Donato Schultz, MD;  Location: [facility] ENDOSCOPY;  Service: Cardiovascular;  Laterality: N/A;  . Cardioversion N/A 06/19/2014    Procedure: CARDIOVERSION;  Surgeon: Donato Schultz, MD;  Location: [facility] ENDOSCOPY;  Service: Cardiovascular;  Laterality: N/A;  . Biv icd genertaor change out N/A 12/12/2013    Procedure: BIV ICD [id number] CHANGE OUT;  Surgeon: Marinus Maw, MD;  Location: [facility] CATH LAB;  Service: Cardiovascular;  Laterality: N/A;    Family History  Problem Relation Age of Onset  . Diabetes Brother   . Heart attack Sister 20    also has liver problems   . Heart attack Mother     deceased   Social History:  reports that she has never smoked. She has never used smokeless tobacco. She reports that she does not drink alcohol or use illicit drugs.  Allergies:  Allergies  Allergen Reactions  . Tuberculin Tests Swelling    Medications Prior to  Admission  Medication Sig Dispense Refill  . allopurinol (ZYLOPRIM) 100 MG tablet Take 1 tablet (100 mg total) by mouth daily. 90 tablet 3  . amiodarone (PACERONE) 200 MG tablet Take 1 tablet (200 mg total) by mouth 2 (two) times daily. 180 tablet 3  . atorvastatin (LIPITOR) 20 MG tablet Take 1 tablet (20 mg total) by mouth daily. 90 tablet 3  . colchicine 0.6 MG tablet Take 0.5 tablets (0.3 mg total) by mouth daily. (Patient taking differently: Take 0.6 mg by mouth daily. ) 15 tablet 1  . ferrous sulfate 325 (65 FE) MG tablet Take 1 tablet (325 mg total) by mouth 2 (two) times daily with a meal. 60 tablet 0  . furosemide (LASIX) 40 MG tablet Take 2 tablets in am, 2 tablets at noon, and 1 tablet in pm (Patient taking differently: Take 40-80 mg by mouth 3 (three) times daily.  Take 2 tablets in am, 2 tablets at noon, and 1 tablet in pm) 120 tablet 3  . hydrALAZINE (APRESOLINE) 25 MG tablet Take 1.5 tablets (37.5 mg total) by mouth 3 (three) times daily. 270 tablet 6  . isosorbide mononitrate (IMDUR) 60 MG 24 hr tablet Take 1 tablet (60 mg total) by mouth daily. 90 tablet 1  . milrinone (PRIMACOR) 20 MG/100ML SOLN infusion Inject 30.2625 mcg/min into the vein continuous. 0.25 mcg/kg/min 100 mL 0  . Multiple Vitamin (MULTIVITAMIN) tablet Take 0.5 tablets by mouth daily.     . Omega-3 Fatty Acids (FISH OIL) 1000 MG CAPS Take 1 capsule by mouth daily.    . potassium chloride SA (K-DUR,KLOR-CON) 20 MEQ tablet Take 2 tablets (40 mEq total) by mouth 2 (two) times daily. Take additional 20 MEQ of Potassium on Mondays and Fridays with Metolazone (Patient taking differently: Take 20 mEq by mouth 2 (two) times daily. Take additional 20 MEQ of Potassium on Mondays and Fridays with Metolazone) 580 tablet 3  . warfarin (COUMADIN) 2.5 MG tablet Take 1 tablet per day on Sun Tue Wed Thu Sat and 2 tabs per day on Mon & Fri. 60 tablet 0  . metolazone (ZAROXOLYN) 5 MG tablet Take one tab po on MONDAYS and FRIDAYS or as directed by provider (Patient taking differently: Take 5 mg by mouth See admin instructions. Take one tab po on MONDAYS and FRIDAYS or as directed by provider) 90 tablet 3  . nitroGLYCERIN (NITROSTAT) 0.4 MG SL tablet Place 0.4 mg under the tongue every 5 (five) minutes as needed for chest pain.        Lab Results: UA: 100mg  protein, 3-6WBC   Recent Labs  2014-09-17 0842  WBC 6.7  HGB 12.2  HCT 35.9*  PLT 278   BMET  Recent Labs  17-Sep-2014 0842  NA 137  K 5.9*  CL 102  CO2 14*  GLUCOSE 108*  BUN 107*  CREATININE 6.78*  CALCIUM 9.8   LFT No results for input(s): PROT, ALBUMIN, AST, ALT, ALKPHOS, BILITOT, BILIDIR, IBILI in the last 72 hours. Dg Chest 2 View  Sep 17, 2014   CLINICAL DATA:  Short of breath for 2 weeks, L worsening recently, cough  EXAM: CHEST  2  VIEW  COMPARISON:  Chest x-ray of 06/14/2014  FINDINGS: Cardiomegaly may have increased somewhat, and pericardial effusion cannot be excluded. There is pulmonary vascular congestion present with small effusions consistent with congestive heart failure. AICD leads remain. There are degenerative changes diffusely throughout the thoracic spine. A right PICC line is noted with the tip overlying the mid  SVC.  IMPRESSION: 1. Increase in size of cardiomegaly.  Question pericardial effusion. 2. Mild CHF. 3. Right PICC line tip overlies the mid SVC   Electronically Signed   By: Dwyane Dee M.D.   On: 09/08/2014 09:46    ROS: no change in vision + SOB No CP No Abd pain + edema No neuropathic Sxs Chronic pain in knees and low back  PHYSICAL EXAM: Blood pressure 125/63, pulse 80, temperature 97.7 F (36.5 C), temperature source Axillary, resp. rate 20, weight 99.338 kg (219 lb), SpO2 100 %. HEENT: PERRLA EOMI.  Wearing non rebreather mask NECK:+ JVD LUNGS:Decreased BS bases with faint crackles CARDIAC:irreg,irreg 2/6 systolic M ABD:+ BS NTND  Liver palp 4FB below costal margin EXT:3+ edema NEURO:CNI Ox3 + asterixis + presacral edema  Assessment: 1. Acute on CKD 3/4 most likely secondary to decompensated CHF 2. Mild Hyperkalemia 3. Volume overload 4. Severe cardiomyopathy PLAN: 1. Spoke with Dr Gala Romney who knows pt well and he feels comfort care is most appropriate and I agree.  Hemodialysis is not an appropriate option given her severe cardiomyopathy. 2. Could start IV lasix  TID if plan is to try to diurese 3. Will sign off as Dr Gala Romney is capable of handling this   Dewaun Kinzler T 09/27/2014, 2:19 PM

## 2014-09-06 NOTE — ED Notes (Signed)
Lactic acid results given to Dr. Rancour 

## 2014-09-06 NOTE — Telephone Encounter (Signed)
[facility]urse from [facility] called pt is in the ED SOB,.. FYI

## 2014-09-06 NOTE — Consult Note (Signed)
Referring Physician: Dr. Craige Cotta Primary Physician: Alice Reichert, MD Primary Cardiologist: Dr. Gala Romney Reason for Consultation: CHF  HPI: [name] is a 81 yo female with a history of NICM s/p BiV ICD, HTN, chronic systolic HF, permanent afib (s/p AVN ablation 2010 and again repeat 2015), CVA, DM2 and CKD stage IV.  ECHO (04/2014): EF 15-20%, grade III DD, severe MR, biatrial enlargement, mod TR  Admitted 04/2014 with increased SOB and fatigue. ICD interrogated and showed 1/3 of ventricular beats actually conducted through AV node limiting BiV pacing and underwent repeat AV node ablation. Had RHC showing low output and started on milrinone. Failed milrinone wean. Loaded with amiodarone for potential DC-CV.   Admitted to [facility] 10/14 with increased dyspnea. She continued on milrinone and diuresed with IV lasix. She also had TEE/DC-CV for ongoing A fib. This was successful. She was discharge increased oral diuretics and hydralazine. Overall she diuresed 24 pounds. Discharge weight 177 pounds.   12/23 Has been struggling at home since last visit with volume overload and has received IV lasix multiple times. PICC is clotted and will be going to Short Stay today to get TPA. DOE with minimal exertion such as doing ADLs. +orthopnea. Denies CP or PND. Weight at home 192-195 lbs. +LE edema.   12/23 PICC cleared successfully  08/28/14 K 4.1 Creatinine 4.96 on Milrinone 0.375.  Contacted HHRN Angie and she reports . Weight at home trending up and increased dyspnea noted.  I called her husband and he says weight at home continues to trend up. Now weight is up to 200 pounds. Mr Verdell says his wife wants is having increased SOB but refuses to return to the hospital.  I explained that her time is limited as diuretics and inotropes are not working. He verbalized understanding.  Will give one dose of 80 mg IV lasix to see if this helps dyspnea. Encouraged to return to ED if she continues to  decline.   01/06 [name] has continued to decline. Her SOB became worse yesterday, eating very little, much weaker. Husband sat up with her all night because she was breathing poorly, describes orthopnea and PND. Respirations more labored at rest also. HHRN came today and pt came to ER.   In ER, K+ 5.9, BUN 107, Cr 6.78. Placed on BiPAP by CCM, but no HD, no extraordinary measures requested. Husband is very clear on this.  Review of Systems:     Cardiac Review of Systems: {Y] = yes  = no  Chest Pain [    ]  Resting SOB [ y  ] Exertional SOB  Cove.Etienne  ]  Orthopnea [ y ]   Pedal Edema [  y ]    Palpitations [  ] Syncope  [  ]   Presyncope [   ]  General Review of Systems: [Y] = yes [  ]=no Constitional: recent weight change [ y ]; anorexia Cove.Etienne  ]; fatigue [  y]; nausea [  ]; night sweats [  ]; fever [  ]; or chills [  ];  Dental: poor dentition[  ];  Eye : blurred vision [  ]; diplopia [   ]; vision changes [  ];  Amaurosis fugax[  ]; Resp: cough [  ];  wheezing[  ];  hemoptysis[  ]; shortness of breath[ y ]; paroxysmal nocturnal dyspnea[ y ]; dyspnea on exertion[ y ]; or orthopnea[ y ];  GI:  gallstones[  ], vomiting[  ];  dysphagia[  ]; melena[  ];  hematochezia [  ]; heartburn[  ];   Hx of  Colonoscopy[  ]; GU: kidney stones [  ]; hematuria[  ];   dysuria [  ];  nocturia[  ];  history of     obstruction [  ];                 Skin: rash, swelling[  ];, hair loss[  ];  peripheral edema[  ];  or itching[  ]; Musculosketetal: myalgias[  ];  joint swelling[  ];  joint erythema[  ];  joint pain[  ];  back pain[  ];  Heme/Lymph: bruising[  ];  bleeding[  ];  anemia[  ];  Neuro: TIA[  ];  headaches[  ];  stroke[  ];  vertigo[  ];  seizures[  ];   paresthesias[  ];  difficulty walking[  y];  Psych:depression[  ]; anxiety[  ];  Endocrine: diabetes[  ];  thyroid  dysfunction[  ];  Immunizations: Flu [  ]; Pneumococcal[  ];  Other:  Past Medical History  Diagnosis Date  . Nonischemic cardiomyopathy     a) Initial dx 1992 b) nl coronary angios in 1/03 c) AICD/bivent. PM - 4/09;    . Hypertension   . CKD (chronic kidney disease), stage IV   . PSVT (paroxysmal supraventricular tachycardia)     s/p radiofrequency ablation  . Gout     uric acid of 10 6/11  . Anemia   . Thrombus     left upper extremity; following device insertion  . Chronic anticoagulation   . Coronary artery disease   . Chronic systolic heart failure     a) NICM b) EF 40-45% (01/2009) c) EF 20-25% (04/2014) d) RHC (04/2014): RA 9, RV 34/2/6, PA 34/16 (24), PCWP: 12, Fick CO/CI: 3.6 /1.8, PA sat 50% and 52%  . Automatic implantable cardioverter-defibrillator in situ   . Hepatitis 1962    "don't know which kind"  . CVA (cerebral vascular accident) 1992    "memory not always clear since"  . Chronic mid back pain   . AV node dysfunction     a) AV node ablation in 2010 and repeat 2015  . CHF (congestive heart failure)   . History of blood transfusion     "w/pregnancy; maybe last time I was in hospital" (06/14/2014)  . DJD (degenerative joint disease)     knees; right hip  . Arthritis     "knees, back" (06/14/2014)   Past Surgical History  Procedure Laterality Date  . Bi-ventricular implantable cardioverter defibrillator  (crt-d)  4/09; 12/13/2013    MDT CRTD implanted by Dr Ladona Ridgel; gen change to MDT CRTD 4-15 by Dr Ladona Ridgel  . Cataract extraction w/ intraocular lens implant Right ~ 2009  . Abdominal hysterectomy      "to remove tumors"  . Av node ablation  10/2008; 2015    Hattie Perch 06/14/2014  . Cardiac catheterization  01/2009; 09/07/2001    Hattie Perch 01/01/2011; Hattie Perch 01/14/2011  . Tee without cardioversion N/A 06/19/2014    Procedure:  TRANSESOPHAGEAL ECHOCARDIOGRAM (TEE);  Surgeon: Donato Schultz, MD;  Location: [facility] ENDOSCOPY;  Service: Cardiovascular;  Laterality: N/A;  . Cardioversion  N/A 06/19/2014    Procedure: CARDIOVERSION;  Surgeon: Donato Schultz, MD;  Location: [facility] ENDOSCOPY;  Service: Cardiovascular;  Laterality: N/A;  . Biv icd genertaor change out N/A 12/12/2013    Procedure: BIV ICD ZOXWRUEAV CHANGE OUT;  Surgeon: Marinus Maw, MD;  Location: [facility] CATH LAB;  Service: Cardiovascular;  Laterality: N/A;    Current Medications: . amiodarone  200 mg Oral BID  . aspirin  324 mg Oral NOW   Or  . aspirin  300 mg Rectal NOW  . atorvastatin  20 mg Oral Daily   Infusions:  . sodium chloride     Medication Sig  allopurinol (ZYLOPRIM) 100 MG tablet Take 1 tablet (100 mg total) by mouth daily.  amiodarone (PACERONE) 200 MG tablet Take 1 tablet (200 mg total) by mouth 2 (two) times daily.  atorvastatin (LIPITOR) 20 MG tablet Take 1 tablet (20 mg total) by mouth daily.  colchicine 0.6 MG tablet Take 0.5 tablets (0.3 mg total) by mouth daily. Patient taking differently: Take 0.6 mg by mouth daily.   ferrous sulfate 325 (65 FE) MG tablet Take 1 tablet (325 mg total) by mouth 2 (two) times daily with a meal.  furosemide (LASIX) 40 MG tablet Take 2 tablets in am, 2 tablets at noon, and 1 tablet in pm  hydrALAZINE (APRESOLINE) 25 MG tablet Take 1.5 tablets (37.5 mg total) by mouth 3 (three) times daily.  isosorbide mononitrate (IMDUR) 60 MG 24 hr tablet Take 1 tablet (60 mg total) by mouth daily.  milrinone (PRIMACOR) 20 MG/100ML SOLN infusion Inject 30.2625 mcg/min into the vein continuous. 0.25 mcg/kg/min  Multiple Vitamin (MULTIVITAMIN) tablet Take 0.5 tablets by mouth daily.   Omega-3 Fatty Acids (FISH OIL) 1000 MG CAPS Take 1 capsule by mouth daily.  potassium chloride SA (K-DUR,KLOR-CON) 20 MEQ tablet Take 2 tablets (40 mEq total) by mouth 2 (two) times daily. Take additional 20 MEQ of Potassium on Mondays and Fridays with Metolazone Patient taking differently: Take 20 mEq by mouth 2 (two) times daily. Take additional 20 MEQ of Potassium on Mondays and Fridays with Metolazone   warfarin (COUMADIN) 2.5 MG tablet Take 1 tablet per day on Sun Tue Wed Thu Sat and 2 tabs per day on Mon & Fri.  metolazone (ZAROXOLYN) 5 MG tablet Take one tab po on MONDAYS and FRIDAYS or as directed by provider  nitroGLYCERIN (NITROSTAT) 0.4 MG SL tablet Place 0.4 mg under the tongue every 5 (five) minutes as needed for chest pain.    Allergies  Allergen Reactions  . Tuberculin Tests Swelling   History   Social History  . Marital Status: Married    Spouse Name: N/A    Number of Children: N/A  . Years of Education: N/A   Occupational History  . Retired    Social History Main Topics  . Smoking status: Never Smoker   . Smokeless tobacco: Never Used  . Alcohol Use: No  . Drug Use: No  . Sexual Activity: No   Other Topics Concern  . Not on file   Social History Narrative   Married, retired, does not get regular exercise, no caffeine.     Family History  Problem Relation Age of Onset  . Diabetes Brother   . Heart attack Sister 22    also has liver problems   . Heart attack Mother  deceased   Family Status  Relation Status Death Age  . Father Deceased   . Mother Deceased     at an advanced age; hx of cardiomegaly    PHYSICAL EXAM: Filed Vitals:   09/13/2014 1309  BP:   Pulse: 80  Temp: 97.7 F (36.5 C)  Resp:     No intake or output data in the 24 hours ending 09/14/2014 1314  General:  Critically ill appearing. Dyspneic on Facemask HEENT: normal Neck: supple. JVP to ear . Carotids 2+ bilat; no bruits. No lymphadenopathy or thryomegaly appreciated. Cor: PMI nondisplaced. Regular  Tachy. Loud s3. 2/6 MR. Lungs: crackles Abdomen: soft, nontender, + distended. No hepatosplenomegaly. No bruits or masses. Good bowel sounds. Extremities: no cyanosis, clubbing, rash, 2-3+ edema into thighs Neuro: alert & oriented x 3, cranial nerves grossly intact. moves all 4 extremities w/o difficulty. Affect pleasant.  ECG: SR with v pacing 88  Results for orders placed  or performed during the hospital encounter of 09/21/2014 (from the past 24 hour(s))  CBC     Status: Abnormal   Collection Time: 09/29/2014  8:42 AM  Result Value Ref Range   WBC 6.7 4.0 - 10.5 K/uL   RBC 4.10 3.87 - 5.11 MIL/uL   Hemoglobin 12.2 12.0 - 15.0 g/dL   HCT 78.2 (L) 95.6 - 21.3 %   MCV 87.6 78.0 - 100.0 fL   MCH 29.8 26.0 - 34.0 pg   MCHC 34.0 30.0 - 36.0 g/dL   RDW 08.6 (H) 57.8 - 46.9 %   Platelets 278 150 - 400 K/uL  Basic metabolic panel     Status: Abnormal   Collection Time: 09/21/2014  8:42 AM  Result Value Ref Range   Sodium 137 135 - 145 mmol/L   Potassium 5.9 (H) 3.5 - 5.1 mmol/L   Chloride 102 96 - 112 mEq/L   CO2 14 (L) 19 - 32 mmol/L   Glucose, Bld 108 (H) 70 - 99 mg/dL   BUN 629 (H) 6 - 23 mg/dL   Creatinine, Ser 5.28 (H) 0.50 - 1.10 mg/dL   Calcium 9.8 8.4 - 41.3 mg/dL   GFR calc non Af Amer 5 (L) >90 mL/min   GFR calc Af Amer 6 (L) >90 mL/min   Anion gap 21 (H) 5 - 15  BNP (order ONLY if patient complains of dyspnea/SOB AND you have documented it for THIS visit)     Status: Abnormal   Collection Time: 09/12/2014  8:42 AM  Result Value Ref Range   B Natriuretic Peptide 1185.7 (H) 0.0 - 100.0 pg/mL  Troponin I     Status: Abnormal   Collection Time: 09/24/2014  8:42 AM  Result Value Ref Range   Troponin I 0.22 (H) <0.031 ng/mL  Protime-INR     Status: Abnormal   Collection Time: 09/23/2014  8:42 AM  Result Value Ref Range   Prothrombin Time 31.6 (H) 11.6 - 15.2 seconds   INR 3.02 (H) 0.00 - 1.49  I-stat troponin, ED (not at [facility])     Status: Abnormal   Collection Time: 09/20/2014  9:13 AM  Result Value Ref Range   Troponin i, poc 0.16 (HH) 0.00 - 0.08 ng/mL   Comment NOTIFIED PHYSICIAN    Comment 3          Urinalysis, Routine w reflex microscopic     Status: Abnormal   Collection Time: 09/28/2014  9:50 AM  Result Value Ref Range   Color, Urine AMBER (A) YELLOW  APPearance CLOUDY (A) CLEAR   Specific Gravity, Urine 1.016 1.005 - 1.030   pH 5.0 5.0 - 8.0     Glucose, UA NEGATIVE NEGATIVE mg/dL   Hgb urine dipstick NEGATIVE NEGATIVE   Bilirubin Urine SMALL (A) NEGATIVE   Ketones, ur NEGATIVE NEGATIVE mg/dL   Protein, ur 161 (A) NEGATIVE mg/dL   Urobilinogen, UA 0.2 0.0 - 1.0 mg/dL   Nitrite NEGATIVE NEGATIVE   Leukocytes, UA SMALL (A) NEGATIVE  Urine microscopic-add on     Status: Abnormal   Collection Time: 09/16/14  9:50 AM  Result Value Ref Range   Squamous Epithelial / LPF RARE RARE   WBC, UA 3-6 <3 WBC/hpf   Bacteria, UA MANY (A) RARE   Urine-Other AMORPHOUS URATES/PHOSPHATES   I-Stat CG4 Lactic Acid, ED     Status: Abnormal   Collection Time: 2014/09/16 11:09 AM  Result Value Ref Range   Lactic Acid, Venous 5.21 (H) 0.5 - 2.2 mmol/L  I-STAT 3, venous blood gas (G3P V)     Status: Abnormal   Collection Time: 09-16-14 11:10 AM  Result Value Ref Range   pH, Ven 7.340 (H) 7.250 - 7.300   pCO2, Ven 24.0 (L) 45.0 - 50.0 mmHg   pO2, Ven 38.0 30.0 - 45.0 mmHg   Bicarbonate 13.0 (L) 20.0 - 24.0 mEq/L   TCO2 14 0 - 100 mmol/L   O2 Saturation 71.0 %   Acid-base deficit 11.0 (H) 0.0 - 2.0 mmol/L   Collection site RADIAL, ALLEN'S TEST ACCEPTABLE    Drawn by Operator    Sample type VENOUS    Comment NOTIFIED PHYSICIAN   Troponin I     Status: Abnormal   Collection Time: 09/16/2014 12:00 PM  Result Value Ref Range   Troponin I 0.24 (H) <0.031 ng/mL   Radiology:  Dg Chest 2 View 09/16/2014   CLINICAL DATA:  Short of breath for 2 weeks, L worsening recently, cough  EXAM: CHEST  2 VIEW  COMPARISON:  Chest x-ray of 06/14/2014  FINDINGS: Cardiomegaly may have increased somewhat, and pericardial effusion cannot be excluded. There is pulmonary vascular congestion present with small effusions consistent with congestive heart failure. AICD leads remain. There are degenerative changes diffusely throughout the thoracic spine. A right PICC line is noted with the tip overlying the mid SVC.  IMPRESSION: 1. Increase in size of cardiomegaly.  Question  pericardial effusion. 2. Mild CHF. 3. Right PICC line tip overlies the mid SVC   Electronically Signed   By: Dwyane Dee M.D.   On: 09/16/14 09:46    ASSESSMENT: 1. Cardiogenic shock 2. Acute on chronic systolic HF 3. Acute respiratory failure 4. Acute on chronic renal failure 5. Lactic acidosis  6. DNR/DNI 7. Hyperkalemia  PLAN/DISCUSSION: [name] has been declining recently. Her renal function has worsened but her husband is clear that she is not a candidate for HD or other extraordinary measures. He wants her made comfortable. Will order PRN morphine, may need drip.  Theodore Demark, PA-C 09/16/2014 1:14 PM Beeper [phone]   Patient seen and examined with Theodore Demark, PA-C. We discussed all aspects of the encounter. I agree with the assessment and plan as stated above.   [name] is actively dying from advanced HF/cardiogenic shock with multi-system organ failure despite home milrinone. She has massive volume overload. I confirmed DNR/DNI status with her. We discussed that she will likely not survive this admission. I talked to her about starting a morphine drip at this time; however she  says she is reasonably comfortable now despite her apparent respiratory distress. Will continue home milrinone and attempt diuresis with IV lasix however I doubt this will have much effect. Will give one dose of morphine now for comfort. If she gets more uncomfortable she would be OK with morphine gtt. I will speak to her husband as well.   Nicolas Banh,MD 2:06 PM

## 2014-09-06 NOTE — Care Management Note (Unsigned)
    Page 1 of 1   September 13, 2014     3:15:25 PM CARE MANAGEMENT NOTE 09/13/2014  Patient:  [name], [name]   Account Number:  [id number]  Date Initiated:  09-13-14  Documentation initiated by:  GRAVES-BIGELOW,Sarha Bartelt  Subjective/Objective Assessment:   Pt admitted for SOB- placed on Bipap in ED and now on nonrebreather. Pt has support of family. Pt is DNR. Pt is active with [facility] for [facility].     Action/Plan:   CM will continue to monitor for disposition needs.   Anticipated DC Date:  09/08/2014   Anticipated DC Plan:  HOME W HOSPICE CARE      DC Planning Services  CM consult      Choice offered to / List presented to:             Status of service:  In process, will continue to follow Medicare Important Message given?   (If response is "NO", the following Medicare IM given date fields will be blank) Date Medicare IM given:   Medicare IM given by:   Date Additional Medicare IM given:   Additional Medicare IM given by:    Discharge Disposition:    Per UR Regulation:  Reviewed for med. necessity/level of care/duration of stay  If discussed at Long Length of Stay Meetings, dates discussed:    Comments:

## 2014-09-06 NOTE — ED Notes (Signed)
Patient transported to X-ray 

## 2014-09-06 NOTE — H&P (Addendum)
PULMONARY / CRITICAL CARE MEDICINE   Name: [name] MRN: [id number] DOB: 26-Jul-1933    ADMISSION DATE:  10/04/2014  REFERRING MD :  Dr. Manus Gunning   CHIEF COMPLAINT:  SOB.    INITIAL PRESENTATION: 81 y/o F with PMH of NICM s/p AICD, Afib on chronic anticoagulation and home milrinone who presented to [facility] ER on 1/6 with worsening shortness of breath.  Work up consistent with decompensated CHF & CKD.    STUDIES:  06/19/14 TEE >> mild LVH, EF 15-20%, severe TR  SIGNIFICANT EVENTS: 01/06  Admit with decompensated CHF, CKD    HISTORY OF PRESENT ILLNESS:  81 y/o F with PMH of CVA, Back pain, gout, anemia, LUE DVT, CAD, NICM s/p AICD, Afib on chronic anticoagulation and home milrinone who presented to [facility] ER on 1/6 with worsening shortness of breath.  The patient is followed by Dr. Gala Romney as an outpatient for CHF and home health to assist with IV infusion of Milrinone.  Am of admission, patient was medicated with an additional 80 mg of lasix with only 200 ml of UOP.  Patient and husband report approximately 3 weeks of worsening dyspnea, increased lower extremity swelling up into the abdomen, chest tightness, decreased appetite, weakness and difficulty with lying flat.    She has had multiple admissions in the recent past for CHF.  Her most recent dry weight was 192 lbs on 08/23/14.  She was noted to be up to 200 lbs on 12/28 by home scales.  She was encouraged at that time to return to the hospital if symptoms worsened but she refused.    ER work up notable for CXR consistent with CHF, increased cardiomegaly, troponin 0.24, lactic acid 5.21, Na 137, K 5.9, CO2 14, sr cr 6.78, WBC 6.7, Hgb 12.2 and platelets 278.  Due to respiratory distress, she was placed on bipap for support.  PCCM called for ICU admission.     PAST MEDICAL HISTORY :   has a past medical history of Nonischemic cardiomyopathy; Hypertension; CKD (chronic kidney disease), stage IV; PSVT (paroxysmal supraventricular  tachycardia); Gout; Anemia; Thrombus; Chronic anticoagulation; Coronary artery disease; Chronic systolic heart failure; Automatic implantable cardioverter-defibrillator in situ; Hepatitis (1962); CVA (cerebral vascular accident) (1992); Chronic mid back pain; AV node dysfunction; CHF (congestive heart failure); History of blood transfusion; DJD (degenerative joint disease); and Arthritis.  has past surgical history that includes Bi-ventricular implantable cardioverter defibrillator  (crt-d) (4/09; 12/13/2013); Cataract extraction w/ intraocular lens implant (Right, ~ 2009); Abdominal hysterectomy; AV node ablation (10/2008; 2015); Cardiac catheterization (01/2009; 09/07/2001); TEE without cardioversion (N/A, 06/19/2014); Cardioversion (N/A, 06/19/2014); and Biv icd genertaor change out (N/A, 12/12/2013).    HOME MEDICATIONS:  Prior to Admission medications   Medication Sig Start Date End Date Taking? Authorizing Provider  allopurinol (ZYLOPRIM) 100 MG tablet Take 1 tablet (100 mg total) by mouth daily. 06/27/14  Yes Dolores Patty, MD  amiodarone (PACERONE) 200 MG tablet Take 1 tablet (200 mg total) by mouth 2 (two) times daily. 06/27/14  Yes Dolores Patty, MD  atorvastatin (LIPITOR) 20 MG tablet Take 1 tablet (20 mg total) by mouth daily. 06/27/14  Yes Dolores Patty, MD  colchicine 0.6 MG tablet Take 0.5 tablets (0.3 mg total) by mouth daily. Patient taking differently: Take 0.6 mg by mouth daily.  06/22/14  Yes Amy D Clegg, NP  ferrous sulfate 325 (65 FE) MG tablet Take 1 tablet (325 mg total) by mouth 2 (two) times daily with a meal. 04/21/14  Yes Elease Etienne, MD  furosemide (LASIX) 40 MG tablet Take 2 tablets in am, 2 tablets at noon, and 1 tablet in pm Patient taking differently: Take 40-80 mg by mouth 3 (three) times daily. Take 2 tablets in am, 2 tablets at noon, and 1 tablet in pm 08/28/14  Yes Laurey Morale, MD  hydrALAZINE (APRESOLINE) 25 MG tablet Take 1.5 tablets (37.5 mg  total) by mouth 3 (three) times daily. 06/27/14  Yes Dolores Patty, MD  isosorbide mononitrate (IMDUR) 60 MG 24 hr tablet Take 1 tablet (60 mg total) by mouth daily. 05/09/14  Yes Laurey Morale, MD  milrinone [facility]) 20 MG/100ML SOLN infusion Inject 30.2625 mcg/min into the vein continuous. 0.25 mcg/kg/min 07/13/14  Yes Dolores Patty, MD  Multiple Vitamin (MULTIVITAMIN) tablet Take 0.5 tablets by mouth daily.    Yes Historical Provider, MD  Omega-3 Fatty Acids (FISH OIL) 1000 MG CAPS Take 1 capsule by mouth daily.   Yes Historical Provider, MD  potassium chloride SA (K-DUR,KLOR-CON) 20 MEQ tablet Take 2 tablets (40 mEq total) by mouth 2 (two) times daily. Take additional 20 MEQ of Potassium on Mondays and Fridays with Metolazone Patient taking differently: Take 20 mEq by mouth 2 (two) times daily. Take additional 20 MEQ of Potassium on Mondays and Fridays with Metolazone 08/14/14  Yes Dolores Patty, MD  warfarin (COUMADIN) 2.5 MG tablet Take 1 tablet per day on Sun Tue Wed Thu Sat and 2 tabs per day on Mon & Fri. 04/21/14  Yes Elease Etienne, MD  metolazone (ZAROXOLYN) 5 MG tablet Take one tab po on MONDAYS and FRIDAYS or as directed by provider Patient taking differently: Take 5 mg by mouth See admin instructions. Take one tab po on MONDAYS and FRIDAYS or as directed by provider 07/20/14   Dolores Patty, MD  nitroGLYCERIN (NITROSTAT) 0.4 MG SL tablet Place 0.4 mg under the tongue every 5 (five) minutes as needed for chest pain.     Historical Provider, MD   Allergies  Allergen Reactions  . Tuberculin Tests Swelling    FAMILY HISTORY:  indicated that her mother is deceased. She indicated that her father is deceased.    SOCIAL HISTORY:  reports that she has never smoked. She has never used smokeless tobacco. She reports that she does not drink alcohol or use illicit drugs.  REVIEW OF SYSTEMS:  Unable to complete full ROS as patient is on BiPAP.  Information obtained from  husband, patient and prior medical documentation.   SUBJECTIVE:   VITAL SIGNS: Temp:  [97.6 F (36.4 C)] 97.6 F (36.4 C) (01/06 0819) Pulse Rate:  [78-88] 78 (01/06 1139) Resp:  [20-22] 20 (01/06 1139) BP: (97-117)/(59-73) 111/62 mmHg (01/06 1139) SpO2:  [100 %] 100 % (01/06 1139) FiO2 (%):  [30 %] 30 % (01/06 1139)   VENTILATOR SETTINGS: Vent Mode:  [-] BIPAP FiO2 (%):  [30 %] 30 % Set Rate:  [8 bmp] 8 bmp   INTAKE / OUTPUT: No intake or output data in the 24 hours ending Sep 07, 2014 1145  PHYSICAL EXAMINATION: General:  Obese female in NAD on bipap Neuro:  AAOx4, speech clear, MAE spontaneously  HEENT:  Mm pink/moist, no jvd Cardiovascular:  s1s2 rrr, 4/6 SEM Lungs:  resp's even/non-labored on bipap, lungs bilaterally clear  Abdomen:  Round/soft, bsx4 active  Musculoskeletal:  No acute deformities  Skin:  Warm/dry, 2+ edema BLE, weeping  LABS:  CBC  Recent Labs Lab 2014-09-07 0842  WBC 6.7  HGB 12.2  HCT 35.9*  PLT 278   Coag's  Recent Labs Lab September 13, 2014 0842  INR 3.02*   BMET  Recent Labs Lab 2014-09-13 0842  NA 137  K 5.9*  CL 102  CO2 14*  BUN 107*  CREATININE 6.78*  GLUCOSE 108*   Electrolytes  Recent Labs Lab 09-13-2014 0842  CALCIUM 9.8   Sepsis Markers  Recent Labs Lab 09/13/2014 1109  LATICACIDVEN 5.21*   ABG No results for input(s): PHART, PCO2ART, PO2ART in the last 168 hours.   Liver Enzymes No results for input(s): AST, ALT, ALKPHOS, BILITOT, ALBUMIN in the last 168 hours.   Cardiac Enzymes  Recent Labs Lab 2014-09-13 0842  TROPONINI 0.22*   Glucose No results for input(s): GLUCAP in the last 168 hours.  Imaging No results found.   ASSESSMENT / PLAN:  PULMONARY A: Acute Respiratory Failure - in the setting of decompensated CHF  P:   PRN bipap for increased WOB Diuresis as renal function / BP permit  Pulmonary hygiene  Oxygen to support sats > 92% If declines, consider morphine for dyspnea  relief  CARDIOVASCULAR R PICC >> A:  Decompensated CHF  NICM s/p AICD PSVT s/p Ablation on Chronic Anticoagulation HTN CAD P:  Cardiology consult, appreciate input   Defer Milrinone gtt to cardiology Diuresis as renal function / BP permit Continue amiodarone, lipitor ASA Hold coumadin for now, consider restart in am 1/7 pending status  RENAL A:   Lactic Acidosis  Acute on Chronic CKD - baseline sr cr ~ 2-2.3 Hyperkalemia  P:   Nephrology consulted by EDP No HD per family  Trend BMP  GASTROINTESTINAL A:   Obesity  P:   NPO while on BiPAP  Heart healthy diet otherwise  HEMATOLOGIC A:   Hx DVT LUE, Afib Chronic Anti-Coagulation  P:  Hold coumadin for now, consider restart in am 1/7 PT / INR in am 1/7 Trend CBC Monitor for bleeding   INFECTIOUS A:   No acute infectious process  P:   Monitor fever curve / WBC  ENDOCRINE A:   Mild Hyperglycemia   P:   Monitor glucose on BMP, consider SSI if consistently > 180  NEUROLOGIC A:   Hx CVA P:   Supportive care Mobilize as able to prevent further deconditioning   FAMILY  - Updates:  Husband & Patient updated at bedside.    - DNR / DNI.  Husband and patient agree to medical care up to the point of intubation / ACLS, HD.  No aggressive measures.      Canary Brim, NP-C [facility] Pgr: [phone] or [phone]    13-Sep-2014, 11:45 AM   Reviewed above, examined.  81 yo female with severe chronic systolic heart failure presented with progressive dyspnea and edema.  She has been deteriorating health status for some time.  Both [name] and her husband are in agreement that she would not want aggressive measures such as intubation, mechanical ventilation or hemodialysis.  Cardiology is to assess to determine if there are additional interventions available >> likely not.  Will continue BiPAP for now since this has improved her dyspnea.  She is DNR/DNI.  If no additional cardiac  interventions, then likely transition to comfort measures.  CC time by mean independent of APP time is 40 minutes.   Coralyn Helling, MD [facility] Pulmonary/Critical Care 09/13/2014, 2:37 PM Pager:  [phone] After 3pm call: [phone]

## 2014-09-06 NOTE — Progress Notes (Signed)
UR Completed Fernie Grimm Graves-Bigelow, RN,BSN [phone]  

## 2014-09-06 NOTE — Progress Notes (Signed)
[facility]  Patient Status: Active pt with [facility] prior to this readmission  [facility] is providing the following services: HHRN and [facility] for home inotropes.  [facility] hospital team will follow along with MD team while [name] is here to support POC as ordered for home care.  If patient discharges after hours, please call [phone].   [name] 2014/10/03, 6:38 PM

## 2014-09-06 NOTE — ED Provider Notes (Signed)
CSN: [id number]     Arrival date & time 09/12/2014  0809 History   First MD Initiated Contact with Patient 09/12/14 [phone]     Chief Complaint  Patient presents with  . Congestive Heart Failure  . Shortness of Breath     (Consider location/radiation/quality/duration/timing/severity/associated sxs/prior Treatment) HPI Comments: Patient is a 81 year old female past medical history significant for nonischemic cardiomyopathy, hypertension, CHF, CAD presenting to the emergency department for 3 weeks of gradually worsening shortness of breath. She states it became increasingly worse yesterday. She states she has been taking her Lasix as prescribed but has not been producing much urine. She endorses associated chest tightness, leg and arm swelling. She denies any chest pain. Patient states this feels like her previous CHF exacerbations. Patient does not wear oxygen at home.  Cardiologist: Dr. Gala Romney.   Patient is a 81 y.o. female presenting with CHF and shortness of breath.  Congestive Heart Failure  Shortness of Breath   Past Medical History  Diagnosis Date  . Nonischemic cardiomyopathy     a) Initial dx 1992 b) nl coronary angios in 1/03 c) AICD/bivent. PM - 4/09;    . Hypertension   . CKD (chronic kidney disease), stage IV   . PSVT (paroxysmal supraventricular tachycardia)     s/p radiofrequency ablation  . Gout     uric acid of 10 6/11  . Anemia   . Thrombus     left upper extremity; following device insertion  . Chronic anticoagulation   . Coronary artery disease   . Chronic systolic heart failure     a) NICM b) EF 40-45% (01/2009) c) EF 20-25% (04/2014) d) RHC (04/2014): RA 9, RV 34/2/6, PA 34/16 (24), PCWP: 12, Fick CO/CI: 3.6 /1.8, PA sat 50% and 52%  . Automatic implantable cardioverter-defibrillator in situ   . Hepatitis 1962    "don't know which kind"  . CVA (cerebral vascular accident) 1992    "memory not always clear since"  . Chronic mid back pain   . AV node dysfunction      a) AV node ablation in 2010 and repeat 2015  . CHF (congestive heart failure)   . History of blood transfusion     "w/pregnancy; maybe last time I was in hospital" (06/14/2014)  . DJD (degenerative joint disease)     knees; right hip  . Arthritis     "knees, back" (06/14/2014)   Past Surgical History  Procedure Laterality Date  . Bi-ventricular implantable cardioverter defibrillator  (crt-d)  4/09; 12/13/2013    MDT CRTD implanted by Dr Ladona Ridgel; gen change to MDT CRTD 4-15 by Dr Ladona Ridgel  . Cataract extraction w/ intraocular lens implant Right ~ 2009  . Abdominal hysterectomy      "to remove tumors"  . Av node ablation  10/2008; 2015    Hattie Perch 06/14/2014  . Cardiac catheterization  01/2009; 09/07/2001    Hattie Perch 01/01/2011; Hattie Perch 01/14/2011  . Tee without cardioversion N/A 06/19/2014    Procedure: TRANSESOPHAGEAL ECHOCARDIOGRAM (TEE);  Surgeon: Donato Schultz, MD;  [facility] ENDOSCOPY;  Service: Cardiovascular;  Laterality: N/A;  . Cardioversion N/A 06/19/2014    Procedure: CARDIOVERSION;  Surgeon: Donato Schultz, MD;  Location: [facility] ENDOSCOPY;  Service: Cardiovascular;  Laterality: N/A;  . Biv icd genertaor change out N/A 12/12/2013    Procedure: BIV ICD [id number] CHANGE OUT;  Surgeon: Marinus Maw, MD;  Location: [facility] CATH LAB;  Service: Cardiovascular;  Laterality: N/A;   Family History  Problem Relation Age of Onset  .  Diabetes Brother   . Heart attack Sister 13    also has liver problems   . Heart attack Mother     deceased   History  Substance Use Topics  . Smoking status: Never Smoker   . Smokeless tobacco: Never Used  . Alcohol Use: No   OB History    No data available     Review of Systems  Respiratory: Positive for chest tightness and shortness of breath.   Cardiovascular: Positive for leg swelling.  All other systems reviewed and are negative.     Allergies  Tuberculin tests  Home Medications   Prior to Admission medications   Medication Sig Start Date End  Date Taking? Authorizing Provider  allopurinol (ZYLOPRIM) 100 MG tablet Take 1 tablet (100 mg total) by mouth daily. 06/27/14  Yes Dolores Patty, MD  amiodarone (PACERONE) 200 MG tablet Take 1 tablet (200 mg total) by mouth 2 (two) times daily. 06/27/14  Yes Dolores Patty, MD  atorvastatin (LIPITOR) 20 MG tablet Take 1 tablet (20 mg total) by mouth daily. 06/27/14  Yes Dolores Patty, MD  colchicine 0.6 MG tablet Take 0.5 tablets (0.3 mg total) by mouth daily. Patient taking differently: Take 0.6 mg by mouth daily.  06/22/14  Yes Amy D Clegg, NP  ferrous sulfate 325 (65 FE) MG tablet Take 1 tablet (325 mg total) by mouth 2 (two) times daily with a meal. 04/21/14  Yes Elease Etienne, MD  furosemide (LASIX) 40 MG tablet Take 2 tablets in am, 2 tablets at noon, and 1 tablet in pm Patient taking differently: Take 40-80 mg by mouth 3 (three) times daily. Take 2 tablets in am, 2 tablets at noon, and 1 tablet in pm 08/28/14  Yes Laurey Morale, MD  hydrALAZINE (APRESOLINE) 25 MG tablet Take 1.5 tablets (37.5 mg total) by mouth 3 (three) times daily. 06/27/14  Yes Dolores Patty, MD  isosorbide mononitrate (IMDUR) 60 MG 24 hr tablet Take 1 tablet (60 mg total) by mouth daily. 05/09/14  Yes Laurey Morale, MD  milrinone [facility]) 20 MG/100ML SOLN infusion Inject 30.2625 mcg/min into the vein continuous. 0.25 mcg/kg/min 07/13/14  Yes Dolores Patty, MD  Multiple Vitamin (MULTIVITAMIN) tablet Take 0.5 tablets by mouth daily.    Yes Historical Provider, MD  Omega-3 Fatty Acids (FISH OIL) 1000 MG CAPS Take 1 capsule by mouth daily.   Yes Historical Provider, MD  potassium chloride SA (K-DUR,KLOR-CON) 20 MEQ tablet Take 2 tablets (40 mEq total) by mouth 2 (two) times daily. Take additional 20 MEQ of Potassium on Mondays and Fridays with Metolazone Patient taking differently: Take 20 mEq by mouth 2 (two) times daily. Take additional 20 MEQ of Potassium on Mondays and Fridays with Metolazone  08/14/14  Yes Dolores Patty, MD  warfarin (COUMADIN) 2.5 MG tablet Take 1 tablet per day on Sun Tue Wed Thu Sat and 2 tabs per day on Mon & Fri. 04/21/14  Yes Elease Etienne, MD  metolazone (ZAROXOLYN) 5 MG tablet Take one tab po on MONDAYS and FRIDAYS or as directed by provider Patient taking differently: Take 5 mg by mouth See admin instructions. Take one tab po on MONDAYS and FRIDAYS or as directed by provider 07/20/14   Dolores Patty, MD  nitroGLYCERIN (NITROSTAT) 0.4 MG SL tablet Place 0.4 mg under the tongue every 5 (five) minutes as needed for chest pain.     Historical Provider, MD   BP 111/62 mmHg  Pulse 78  Temp(Src) 97.6 F (36.4 C) (Oral)  Resp 20  SpO2 100% Physical Exam  Constitutional: She is oriented to person, place, and time. She appears well-developed and well-nourished.  HENT:  Head: Normocephalic and atraumatic.  Right Ear: External ear normal.  Left Ear: External ear normal.  Nose: Nose normal.  Mouth/Throat: Oropharynx is clear and moist. No oropharyngeal exudate.  Eyes: Conjunctivae are normal.  Neck: Neck supple.  Cardiovascular: Normal rate and regular rhythm.  Exam reveals gallop and S3.   Murmur heard.  Systolic murmur is present  Pulmonary/Chest: Accessory muscle usage present. She is in respiratory distress. She has decreased breath sounds (bases).  Abdominal: Soft. There is no tenderness.  Musculoskeletal: Normal range of motion. She exhibits edema (BLE 3+).  Neurological: She is alert and oriented to person, place, and time. No cranial nerve deficit.  Skin: Skin is warm and dry.  Nursing note and vitals reviewed.   ED Course  Procedures (including critical care time). Medications  aspirin chewable tablet 324 mg (not administered)    Or  aspirin suppository 300 mg (not administered)  0.9 %  sodium chloride infusion (not administered)  heparin injection 5,000 Units (not administered)  0.9 %  sodium chloride infusion (not administered)   amiodarone (PACERONE) tablet 200 mg (not administered)  atorvastatin (LIPITOR) tablet 20 mg (not administered)  furosemide (LASIX) injection 80 mg (80 mg Intravenous Given 11-Sep-2014 0955)  sodium bicarbonate injection 50 mEq (50 mEq Intravenous Given 09/11/2014 1138)  calcium gluconate 1 g in sodium chloride 0.9 % 100 mL IVPB (0 g Intravenous Stopped 09/11/2014 1221)    Labs Review Labs Reviewed  CBC - Abnormal; Notable for the following:    HCT 35.9 (*)    RDW 16.2 (*)    All other components within normal limits  BASIC METABOLIC PANEL - Abnormal; Notable for the following:    Potassium 5.9 (*)    CO2 14 (*)    Glucose, Bld 108 (*)    BUN 107 (*)    Creatinine, Ser 6.78 (*)    GFR calc non Af Amer 5 (*)    GFR calc Af Amer 6 (*)    Anion gap 21 (*)    All other components within normal limits  BRAIN NATRIURETIC PEPTIDE - Abnormal; Notable for the following:    B Natriuretic Peptide 1185.7 (*)    All other components within normal limits  TROPONIN I - Abnormal; Notable for the following:    Troponin I 0.22 (*)    All other components within normal limits  PROTIME-INR - Abnormal; Notable for the following:    Prothrombin Time 31.6 (*)    INR 3.02 (*)    All other components within normal limits  URINALYSIS, ROUTINE W REFLEX MICROSCOPIC - Abnormal; Notable for the following:    Color, Urine AMBER (*)    APPearance CLOUDY (*)    Bilirubin Urine SMALL (*)    Protein, ur 100 (*)    Leukocytes, UA SMALL (*)    All other components within normal limits  URINE MICROSCOPIC-ADD ON - Abnormal; Notable for the following:    Bacteria, UA MANY (*)    All other components within normal limits  I-STAT TROPOININ, ED - Abnormal; Notable for the following:    Troponin i, poc 0.16 (*)    All other components within normal limits  I-STAT CG4 LACTIC ACID, ED - Abnormal; Notable for the following:    Lactic Acid, Venous 5.21 (*)  All other components within normal limits  CREATININE, URINE, RANDOM   SODIUM, URINE, RANDOM  BLOOD GAS, ARTERIAL  BRAIN NATRIURETIC PEPTIDE  TROPONIN I  TROPONIN I  TROPONIN I    Imaging Review Dg Chest 2 View  2014/09/11   CLINICAL DATA:  Short of breath for 2 weeks, L worsening recently, cough  EXAM: CHEST  2 VIEW  COMPARISON:  Chest x-ray of 06/14/2014  FINDINGS: Cardiomegaly may have increased somewhat, and pericardial effusion cannot be excluded. There is pulmonary vascular congestion present with small effusions consistent with congestive heart failure. AICD leads remain. There are degenerative changes diffusely throughout the thoracic spine. A right PICC line is noted with the tip overlying the mid SVC.  IMPRESSION: 1. Increase in size of cardiomegaly.  Question pericardial effusion. 2. Mild CHF. 3. Right PICC line tip overlies the mid SVC   Electronically Signed   By: Dwyane Dee M.D.   On: 2014-09-11 09:46     EKG Interpretation   Date/Time:  Wednesday 2014-09-11 08:15:35 EST Ventricular Rate:  88 PR Interval:    QRS Duration: 186 QT Interval:  513 QTC Calculation: 621 R Axis:   172 Text Interpretation:  paced? Nonspecific intraventricular conduction delay  Lateral infarct, age indeterminate Anterior infarct, possibly acute  Confirmed by Manus Gunning  MD, STEPHEN [phone]) on 09-11-2014 8:26:35 AM      10:14 AM Discussed with Dr. Allena Katz of nephrology who will see the patient.   Given increased work of breathing will place patient on BiPap and obtain ABG.   11:30 AM Discussed patient case with Critical Care who will see the patient  CRITICAL CARE Performed by: Francee Piccolo L   Total critical care time: 60 minutes  Critical care time was exclusive of separately billable procedures and treating other patients.  Critical care was necessary to treat or prevent imminent or life-threatening deterioration.  Critical care was time spent personally by me on the following activities: development of treatment plan with patient and/or  surrogate as well as nursing, discussions with consultants, evaluation of patient's response to treatment, examination of patient, obtaining history from patient or surrogate, ordering and performing treatments and interventions, ordering and review of laboratory studies, ordering and review of radiographic studies, pulse oximetry and re-evaluation of patient's condition.   MDM   Final diagnoses:  Acute on chronic systolic HF (heart failure)  Acute on chronic respiratory failure, unspecified whether with hypoxia or hypercapnia  Acute on chronic renal failure    Filed Vitals:   09/11/2014 1139  BP: 111/62  Pulse: 78  Temp:   Resp: 20   Afebrile AAOx4. I have reviewed nursing notes, vital signs, and all appropriate lab and imaging results for this patient. Patient will be admitted to critical care for further management of her CHF exacerbation, acute respiratory failure, and acute kidney injury as she is requiring BiPap. Cardiology and Nephrology on consulting on the patient. Patient d/w with Dr. Manus Gunning, agrees with plan.       Jeannetta Ellis, PA-C 09/11/2014 1302  Glynn Octave, MD 2014/09/11 [phone]

## 2014-09-06 NOTE — Progress Notes (Signed)
Attempted to call report. No answer. Will try again. 

## 2014-09-06 NOTE — ED Notes (Signed)
Pt is from Home, PT home health gave the pt 80mg  of lasix this morning and the pt output has only been 200cc. Per EMS pt has 4 plus pitting edema in upper and lower extermities. PT has had SOB x3 weeks but unable to come to the hospital. Pt has a Pacemaker placed. Pt has a PICC line placed on RT arm.

## 2014-09-06 NOTE — ED Notes (Signed)
Cardiology at bedside.

## 2014-09-06 NOTE — ED Notes (Signed)
CCM at bedside 

## 2014-09-06 NOTE — ED Notes (Signed)
Troponin results given to Dr. Rancour 

## 2014-09-06 NOTE — Telephone Encounter (Signed)
Ok, Dr Gala Romney notified pt is in ER

## 2014-09-06 NOTE — Progress Notes (Signed)
[facility]  Patient Status: Active pt with [facility] prior to this admission  [facility] is providing the following services: [facility]N and [facility] for home inotropes.  We will follow along during this admission and support plan as ordered by MD team.   If patient discharges after hours, please call [phone].   Sedalia Muta 2014/09/22, 11:11 AM

## 2014-09-07 ENCOUNTER — Inpatient Hospital Stay (HOSPITAL_COMMUNITY): Payer: Medicare HMO

## 2014-09-07 DIAGNOSIS — J9620 Acute and chronic respiratory failure, unspecified whether with hypoxia or hypercapnia: Secondary | ICD-10-CM | POA: Insufficient documentation

## 2014-09-07 LAB — BASIC METABOLIC PANEL
ANION GAP: 16 — AB (ref 5–15)
BUN: 114 mg/dL — ABNORMAL HIGH (ref 6–23)
CALCIUM: 9.7 mg/dL (ref 8.4–10.5)
CO2: 18 mmol/L — ABNORMAL LOW (ref 19–32)
CREATININE: 7.06 mg/dL — AB (ref 0.50–1.10)
Chloride: 102 mEq/L (ref 96–112)
GFR calc Af Amer: 6 mL/min — ABNORMAL LOW (ref >90)
GFR calc non Af Amer: 5 mL/min — ABNORMAL LOW (ref >90)
Glucose, Bld: 113 mg/dL — ABNORMAL HIGH (ref 70–99)
Potassium: 5.1 mmol/L (ref 3.5–5.1)
Sodium: 136 mmol/L (ref 135–145)

## 2014-09-07 MED ORDER — SODIUM CHLORIDE 0.9 % IJ SOLN
10.0000 mL | INTRAMUSCULAR | Status: DC | PRN
  Administered 2014-09-07 (×2): 10 mL
  Filled 2014-09-07: qty 40

## 2014-09-07 MED ORDER — HYDROXYZINE HCL 25 MG PO TABS: 25.0000 mg | ORAL_TABLET | Freq: Three times a day (TID) | ORAL | Status: DC | PRN

## 2014-09-07 MED ORDER — DOCUSATE SODIUM 283 MG RE ENEM
1.0000 | ENEMA | RECTAL | Status: DC | PRN
  Filled 2014-09-07: qty 1

## 2014-09-07 MED ORDER — ACETAMINOPHEN 325 MG PO TABS: 650.0000 mg | ORAL_TABLET | Freq: Four times a day (QID) | ORAL | Status: DC | PRN

## 2014-09-07 MED ORDER — MORPHINE SULFATE 25 MG/ML IV SOLN
5.0000 mg/h | INTRAVENOUS | Status: DC
  Administered 2014-09-07: 1 mg/h via INTRAVENOUS
  Filled 2014-09-07: qty 10

## 2014-09-07 MED ORDER — CALCIUM CARBONATE 1250 MG/5ML PO SUSP
500.0000 mg | Freq: Four times a day (QID) | ORAL | Status: DC | PRN
  Filled 2014-09-07: qty 5

## 2014-09-07 MED ORDER — NEPRO/CARBSTEADY PO LIQD
237.0000 mL | Freq: Three times a day (TID) | ORAL | Status: DC | PRN
  Filled 2014-09-07: qty 237

## 2014-09-07 MED ORDER — ONDANSETRON HCL 4 MG PO TABS: 4.0000 mg | ORAL_TABLET | Freq: Four times a day (QID) | ORAL | Status: DC | PRN

## 2014-09-07 MED ORDER — AMIODARONE HCL 200 MG PO TABS
200.0000 mg | ORAL_TABLET | Freq: Two times a day (BID) | ORAL | Status: DC
  Administered 2014-09-07: 200 mg via ORAL
  Filled 2014-09-07: qty 1

## 2014-09-07 MED ORDER — SODIUM CHLORIDE 0.9 % IJ SOLN: 10.0000 mL | Freq: Two times a day (BID) | INTRAMUSCULAR | Status: DC

## 2014-09-07 MED ORDER — SORBITOL 70 % SOLN: 30.0000 mL | Status: DC | PRN

## 2014-09-07 MED ORDER — ZOLPIDEM TARTRATE 5 MG PO TABS: 5.0000 mg | ORAL_TABLET | Freq: Every evening | ORAL | Status: DC | PRN

## 2014-09-07 MED ORDER — ALTEPLASE 2 MG IJ SOLR
2.0000 mg | Freq: Once | INTRAMUSCULAR | Status: AC
  Administered 2014-09-07: 2 mg
  Filled 2014-09-07: qty 2

## 2014-09-07 MED ORDER — ONDANSETRON HCL 4 MG/2ML IJ SOLN
4.0000 mg | Freq: Four times a day (QID) | INTRAMUSCULAR | Status: DC | PRN
  Administered 2014-09-07: 4 mg via INTRAVENOUS
  Filled 2014-09-07: qty 2

## 2014-09-07 MED ORDER — FUROSEMIDE 10 MG/ML IJ SOLN
160.0000 mg | Freq: Two times a day (BID) | INTRAVENOUS | Status: DC
  Administered 2014-09-07 – 2014-09-08 (×4): 160 mg via INTRAVENOUS
  Filled 2014-09-07 (×5): qty 16

## 2014-09-07 MED ORDER — FUROSEMIDE 10 MG/ML IJ SOLN: 160.0000 mg | Freq: Two times a day (BID) | INTRAMUSCULAR | Status: DC

## 2014-09-07 MED ORDER — CAMPHOR-MENTHOL 0.5-0.5 % EX LOTN
1.0000 "application " | TOPICAL_LOTION | Freq: Three times a day (TID) | CUTANEOUS | Status: DC | PRN
  Filled 2014-09-07: qty 222

## 2014-09-07 MED ORDER — ACETAMINOPHEN 650 MG RE SUPP: 650.0000 mg | Freq: Four times a day (QID) | RECTAL | Status: DC | PRN

## 2014-09-07 NOTE — Progress Notes (Signed)
[name] is a 81 yo female with a history of NICM s/p BiV ICD, HTN, chronic systolic HF, permanent afib (s/p AVN ablation 2010 and again repeat 2015), CVA, DM2 and CKD stage IV.  ECHO (04/2014): EF 15-20%, grade III DD, severe MR, biatrial enlargement, mod TR  Admitted 04/2014 with increased SOB and fatigue. ICD interrogated and showed 1/3 of ventricular beats actually conducted through AV node limiting BiV pacing and underwent repeat AV node ablation. Had RHC showing low output and started on milrinone. Failed milrinone wean. Loaded with amiodarone for potential DC-CV.   Admitted to [facility] 10/14 with increased dyspnea. She continued on milrinone and diuresed with IV lasix. She also had TEE/DC-CV for ongoing A fib. This was successful. She was discharge increased oral diuretics and hydralazine. Overall she diuresed 24 pounds. Discharge weight 177 pounds.   12/23 Has been struggling at home since last visit with volume overload and has received IV lasix multiple times. PICC is clotted and will be going to Short Stay today to get TPA. DOE with minimal exertion such as doing ADLs. +orthopnea. Denies CP or PND. Weight at home 192-195 lbs. +LE edema.   12/23 PICC cleared successfully  08/28/14 K 4.1 Creatinine 4.96 on Milrinone 0.375.  Contacted HHRN Angie and she reports . Weight at home trending up and increased dyspnea noted.  I called her husband and he says weight at home continues to trend up. Now weight is up to 200 pounds. Mr Preston says his wife wants is having increased SOB but refuses to return to the hospital.  I explained that her time is limited as diuretics and inotropes are not working. He verbalized understanding.  Will give one dose of 80 mg IV lasix to see if this helps dyspnea. Encouraged to return to ED if she continues to decline.   01/06 [name] has continued to decline. Her SOB became worse yesterday, eating very little, much weaker. Husband sat up with  her all night because she was breathing poorly, describes orthopnea and PND. Respirations more labored at rest also. HHRN came today and pt came to ER.   In ER, K+ 5.9, BUN 107, Cr 6.78. Placed on BiPAP by CCM, but no HD, no extraordinary measures requested. Husband is very clear on this.  01/07: Renal function slightly worse, off BiPAP, on NRB  Subjective:  Breathing slightly better, no chest pain. Had nausea this am, vomited OJ.   Aggreeable to turning off defibrillator. Doesn't want a lot of sedation, but appreciates having something available.   Intake/Output Summary (Last 24 hours) at 09/07/14 1008 Last data filed at 09/07/14 0615  Gross per 24 hour  Intake 396.76 ml  Output    650 ml  Net -253.24 ml    Current meds: . alteplase  2 mg Intracatheter Once  . amiodarone  200 mg Oral BID  . aspirin  324 mg Oral NOW   Or  . aspirin  300 mg Rectal NOW  . furosemide  160 mg Intravenous BID   Infusions: . sodium chloride 10 mL/hr at 09/25/2014 1619  . milrinone 0.125 mcg/kg/min (09/18/2014 1619)   Objective:  Blood pressure 116/68, pulse 84, temperature 98 F (36.7 C), temperature source Oral, resp. rate 18, height  (1.778 m), weight 222 lb (100.699 kg), SpO2 100 %. Weight change:   Physical Exam: General:  Well appearing. moderat resp difficulty on NRB HEENT: normal Neck: supple. JVP to jaw. Carotids 2+ bilat;  No  lymphadenopathy or thryomegaly appreciated. Cor: PMI nondisplaced. Regular rate & rhythm. Harsh murmur, ? rub, no gallops Lungs: bilateral rales Abdomen: soft, nontender, nondistended. No hepatosplenomegaly. No bruits or masses. Good bowel sounds. Extremities: no cyanosis, clubbing, rash, 2+ edema Neuro: alert & orientedx3, cranial nerves grossly intact. moves all 4 extremities w/o difficulty. Affect pleasant  Telemetry: SR, Vpacing  Lab Results: Basic Metabolic Panel:  Recent Labs Lab September 10, 2014 0842 09/07/14 0628  NA 137 136  K 5.9* 5.1  CL 102 102    CO2 14* 18*  GLUCOSE 108* 113*  BUN 107* 114*  CREATININE 6.78* 7.06*  CALCIUM 9.8 9.7   CBC:  Recent Labs Lab 09/10/14 0842  WBC 6.7  HGB 12.2  HCT 35.9*  MCV 87.6  PLT 278   Cardiac Enzymes:  Recent Labs Lab 09-10-14 0842 2014-09-10 1200  TROPONINI 0.22* 0.24*    Imaging: Dg Chest 2 View  September 10, 2014   CLINICAL DATA:  Short of breath for 2 weeks, L worsening recently, cough  EXAM: CHEST  2 VIEW  COMPARISON:  Chest x-ray of 06/14/2014  FINDINGS: Cardiomegaly may have increased somewhat, and pericardial effusion cannot be excluded. There is pulmonary vascular congestion present with small effusions consistent with congestive heart failure. AICD leads remain. There are degenerative changes diffusely throughout the thoracic spine. A right PICC line is noted with the tip overlying the mid SVC.  IMPRESSION: 1. Increase in size of cardiomegaly.  Question pericardial effusion. 2. Mild CHF. 3. Right PICC line tip overlies the mid SVC   Electronically Signed   By: Dwyane Dee M.D.   On: 2014-09-10 09:46   Dg Chest Port 1 View  09/07/2014   CLINICAL DATA:  CHF.  Shortness of breath.  EXAM: PORTABLE CHEST - 1 VIEW  COMPARISON:  09-10-14; 06/14/2014  FINDINGS: Grossly unchanged enlarged cardiac silhouette and mediastinal contours. Stable position of support apparatus. The pulmonary vasculature appears slightly less distinct than present examination with cephalization flow common with conspicuous within the right upper lung. There is minimal pleural parenchymal thickening about the right minor fissure. Trace left-sided effusion is not excluded. No pneumothorax. Slight worsening bibasilar heterogeneous opacities, left greater than right. Unchanged bones.  IMPRESSION: 1. Findings suggestive of mild worsening asymmetric pulmonary edema, right greater than left. 2. Slight worsening of bibasilar opacities, left greater than right, likely atelectasis.   Electronically Signed   By: Simonne Come M.D.   On:  09/07/2014 07:47     ASSESSMENT:  Principal Problem:   Acute on chronic systolic CHF (congestive heart failure), NYHA class 4   PLAN/DISCUSSION: Comfort care is being pursued, pt does not want to be overly sedated, breathing a little better today. Per instructions, have ordered Lasix, Leta Jungling coming to turn off defibrillator. Family aware pacing still in place. Husband present and agrees with plan.   Theodore Demark, PA-C 09/07/2014 10:08 AM Beeper [phone]   LOS: 1 day   Patient seen and examined with Theodore Demark, PA-C. We discussed all aspects of the encounter. I agree with the assessment and plan as stated above.   Remains dyspneic and volume overload. Getting intermittent morphine doses. Essentially anuric. Creatinine worse.   She realizes she is actively dying from multisystem organ failure. Agreeable to start low-dose morphine gtt at 1/hr. Can titrate as needed. She will pass in the hospital. Husband and family agree with plan. Continue IV lasix.   Daniel Bensimhon,MD 2:12 PM

## 2014-09-07 NOTE — Progress Notes (Signed)
Pt c/o discomfort with breathing, administered 2mg  Morphine, now resting comfortably.

## 2014-09-07 NOTE — Progress Notes (Signed)
PULMONARY / CRITICAL CARE MEDICINE   Name: [name] MRN: [id number] DOB: [birth year]    ADMISSION DATE:  09/27/2014  REFERRING MD :  Dr. Manus Gunning   CHIEF COMPLAINT:  SOB.    INITIAL PRESENTATION: 81 y/o F with PMH of NICM s/p AICD, Afib on chronic anticoagulation and home milrinone who presented to [facility] ER on 1/6 with worsening shortness of breath.  Work up consistent with decompensated CHF & CKD.    STUDIES:  06/19/14 TEE >> mild LVH, EF 15-20%, severe TR  SIGNIFICANT EVENTS: 01/06  Admit with decompensated CHF, CKD    SUBJECTIVE: Off BIPAP, breathing a little improved  VITAL SIGNS: Temp:  [97.6 F (36.4 C)-98 F (36.7 C)] 98 F (36.7 C) (01/07 0805) Pulse Rate:  [73-84] 84 (01/07 0805) Resp:  [16-22] 18 (01/07 0805) BP: (96-116)/(62-68) 116/68 mmHg (01/07 0805) SpO2:  [100 %] 100 % (01/07 0805) Weight:  [100.699 kg (222 lb)] 100.699 kg (222 lb) (01/07 0523)   VENTILATOR SETTINGS:     INTAKE / OUTPUT:  Intake/Output Summary (Last 24 hours) at 09/07/14 1332 Last data filed at 09/07/14 0615  Gross per 24 hour  Intake 276.76 ml  Output    650 ml  Net -373.24 ml    PHYSICAL EXAMINATION: General:  Sitting up in bed, comfortable HEENT: NCAT PULM crackles bilaterally CV: RRR, distant Ab: BS+, soft Ext: diffuse anasarca Neuro: awake and alert  LABS:  CBC  Recent Labs Lab 09/10/2014 0842  WBC 6.7  HGB 12.2  HCT 35.9*  PLT 278   Coag's  Recent Labs Lab 09/16/2014 0842  INR 3.02*   BMET  Recent Labs Lab 09/14/2014 0842 09/07/14 0628  NA 137 136  K 5.9* 5.1  CL 102 102  CO2 14* 18*  BUN 107* 114*  CREATININE 6.78* 7.06*  GLUCOSE 108* 113*   Electrolytes  Recent Labs Lab 09/25/2014 0842 09/07/14 0628  CALCIUM 9.8 9.7   Sepsis Markers  Recent Labs Lab 09/22/2014 1109  LATICACIDVEN 5.21*   ABG No results for input(s): PHART, PCO2ART, PO2ART in the last 168 hours.   Liver Enzymes No results for input(s): AST, ALT, ALKPHOS,  BILITOT, ALBUMIN in the last 168 hours.   Cardiac Enzymes  Recent Labs Lab 09/11/2014 0842 09/24/2014 1200  TROPONINI 0.22* 0.24*   Glucose No results for input(s): GLUCAP in the last 168 hours.  Imaging Dg Chest 2 View  09/03/2014   CLINICAL DATA:  Short of breath for 2 weeks, L worsening recently, cough  EXAM: CHEST  2 VIEW  COMPARISON:  Chest x-ray of 06/14/2014  FINDINGS: Cardiomegaly may have increased somewhat, and pericardial effusion cannot be excluded. There is pulmonary vascular congestion present with small effusions consistent with congestive heart failure. AICD leads remain. There are degenerative changes diffusely throughout the thoracic spine. A right PICC line is noted with the tip overlying the mid SVC.  IMPRESSION: 1. Increase in size of cardiomegaly.  Question pericardial effusion. 2. Mild CHF. 3. Right PICC line tip overlies the mid SVC   Electronically Signed   By: Dwyane Dee M.D.   On: 09/27/2014 09:46     ASSESSMENT / PLAN:  Impression: 1) Acute on chronic systolic heart failure 2) Severe acute hypoxemic respiratory failure 3) CKD  Prognosis is poor. Currently off of BIPAP  Plan: 1) milrinone per cardiology 2) Lasix per renal 3) morphine PRN  Likely transition to full comfort measures  Will ask Advance heart failure service to assume care  Kipp Brood  Kendrick Fries, MD [place] [facility] Pager: [phone] Cell: [phone] If no response, call [phone]

## 2014-09-08 MED ORDER — MORPHINE BOLUS VIA INFUSION
4.0000 mg | Freq: Once | INTRAVENOUS | Status: DC
  Filled 2014-09-08: qty 4

## 2014-09-08 MED ORDER — ATROPINE SULFATE 1 % OP SOLN
2.0000 [drp] | Freq: Four times a day (QID) | OPHTHALMIC | Status: DC
  Administered 2014-09-08 (×3): 2 [drp] via SUBLINGUAL
  Filled 2014-09-08: qty 2

## 2014-09-08 NOTE — Progress Notes (Signed)
       Subjective:   Now on morphine gtt. Very lethargic. Gurgling. Having runs VT.   Intake/Output Summary (Last 24 hours) at 09/08/14 1305 Last data filed at 09/07/14 1900  Gross per 24 hour  Intake 114.17 ml  Output      0 ml  Net 114.17 ml    Current meds: . atropine  2 drop Sublingual QID  . furosemide  160 mg Intravenous BID  . morphine  4 mg Intravenous Once  . sodium chloride  10-40 mL Intracatheter Q12H   Infusions: . sodium chloride 10 mL/hr at 09/18/2014 1619  . milrinone 0.125 mcg/kg/min (09/07/14 1431)  . morphine 2 mg/hr (09/08/14 0432)   Objective:  Blood pressure 96/51, pulse 59, temperature 97.5 F (36.4 C), temperature source Axillary, resp. rate 13, height 5\' 10"  (1.778 m), weight 99.746 kg (219 lb 14.4 oz), SpO2 98 %. Weight change: 0.408 kg (14.4 oz)  Physical Exam: General:  Lethargic. gurgling HEENT: normal Neck: supple. JVP to jaw. Carotids 2+ bilat;  No lymphadenopathy or thryomegaly appreciated. Cor: PMI nondisplaced. Regular rate & rhythm. 3/6 MR loud s3 Lungs: bilateral rales Abdomen: soft, nontender, nondistended. No hepatosplenomegaly. No bruits or masses. Good bowel sounds. Extremities: no cyanosis, clubbing, rash, 4+ edema Neuro: lethargic   Telemetry: SR, Vpacing +runs nsvt  Lab Results: Basic Metabolic Panel:  Recent Labs Lab 09/27/2014 0842 09/07/14 0628  NA 137 136  K 5.9* 5.1  CL 102 102  CO2 14* 18*  GLUCOSE 108* 113*  BUN 107* 114*  CREATININE 6.78* 7.06*  CALCIUM 9.8 9.7   CBC:  Recent Labs Lab 09/05/2014 0842  WBC 6.7  HGB 12.2  HCT 35.9*  MCV 87.6  PLT 278   Cardiac Enzymes:  Recent Labs Lab 09/26/2014 0842 09/30/2014 1200  TROPONINI 0.22* 0.24*    Imaging: Dg Chest Port 1 View  09/07/2014   CLINICAL DATA:  CHF.  Shortness of breath.  EXAM: PORTABLE CHEST - 1 VIEW  COMPARISON:  09/12/2014; 06/14/2014  FINDINGS: Grossly unchanged enlarged cardiac silhouette and mediastinal contours. Stable position of  support apparatus. The pulmonary vasculature appears slightly less distinct than present examination with cephalization flow common with conspicuous within the right upper lung. There is minimal pleural parenchymal thickening about the right minor fissure. Trace left-sided effusion is not excluded. No pneumothorax. Slight worsening bibasilar heterogeneous opacities, left greater than right. Unchanged bones.  IMPRESSION: 1. Findings suggestive of mild worsening asymmetric pulmonary edema, right greater than left. 2. Slight worsening of bibasilar opacities, left greater than right, likely atelectasis.   Electronically Signed   By: Simonne Come M.D.   On: 09/07/2014 07:47     ASSESSMENT:  Principal Problem:   Acute on chronic systolic CHF (congestive heart failure), NYHA class 4 Active Problems:   Acute on chronic respiratory failure, unspecified whether with hypoxia or hypercapnia   PLAN/DISCUSSION:  She is actively dying from multisystem organ failure. Will increase morphine gtt to 5/hr. Start atropine drops. Stop tele. Family at bedside and aware she will due soon. Continue IV lasix and milrinone as comfort measures.    Brandi Azbell,MD 1:05 PM

## 2014-09-12 ENCOUNTER — Encounter: Payer: Self-pay | Admitting: Internal Medicine

## 2014-09-13 ENCOUNTER — Encounter: Payer: Self-pay | Admitting: Internal Medicine

## 2014-09-18 NOTE — Discharge Summary (Signed)
Advanced Heart Failure Team  Discharge Summary   Patient ID: [name] MRN: [id number], DOB/AGE: 05/23/1933 81 y.o. Admit date: 09/08/2014 D/C date:     2014-09-10   Primary Discharge Diagnoses:  1. A/c systolic HF due to NICM 2. Cardiogenic shock 3. A/c renal failure due to cardiorenal syndrome 4. Acute hypoxic respiratory failure 5. Hyperkalemia 6. Paroxysmal atrial fibrillation  Hospital Course:  [name] is a 81 yo female with a history of end-stage systolic HF due to NICM (EF 15%), PAF and CKD IV (baseline creatinine ~3.0).  She has had several admissions for HF over past few months and was started on home milrinone for inotropic support. We have followed her closely in the HF Clinic. Unfortunately, despite very close f/u and inotropic support, [name] has continued to decline with worsening renal function and intractable volume overload.   She presented to the ER on 1/6 with severe resting dyspnea, profound weakness and the inability to eat in setting of almost 30-pound weight gain. In ER, K+ 5.9, BUN 107, Cr 6.78. Seen by CCM and started on IV lasix and BiPAP.   I had a long talk with her and her husband and again discussed her very poor prognosis. She was interested in trying IV lasix to see if this would help but did not want extraordinary measures. We continue her milrinone and high-dose IV lasix and metolazone but unfortunately her renal failure worsened and she became anuric and uremic. She was transitioned to comfort care and morphine was started. She passed quietly om 2014/09/10  with her family at her bedside.    Migdalia Dk MD 09/18/2014, 9:48 PM

## 2014-09-21 ENCOUNTER — Encounter (HOSPITAL_COMMUNITY): Payer: Medicare HMO

## 2014-10-02 NOTE — Progress Notes (Signed)
Wasted 150 ml of Morphine in the sink. Witnessed by Beatris Ship, RN.    Susann Givens, RN

## 2014-10-02 NOTE — Progress Notes (Signed)
Chaplain responded to page from pt nurse to support family of pt who expired. Chaplain provided emotional support, read scripture, offered prayer, and facilitated information sharing between medical team and family. Family described her as a loving woman wjo "would do anything for you." Pt husband also narrated that her heart condition has been a 43yr struggle and this year has been especially difficult. Pt husband and three sons present. Family appreciated chaplain support. Pt family has now left hospital.   2014-09-16 0200  Clinical Encounter Type  Visited With Family  Visit Type Spiritual support;Death  Referral From Nurse  Spiritual Encounters  Spiritual Needs Emotional;Prayer;[facility] text  Stress Factors  Family Stress Factors Loss  Zayli Villafuerte, Mayer Masker, Chaplain 16-Sep-2014 2:25 AM

## 2014-10-02 NOTE — Progress Notes (Signed)
Patient's monitor alarmed asystole. Went to check patient. Brandi Antis, RN, and I, Susann Givens, RN, both listened for one minute. She was truly asystole. Patient was comfort care and was surrounded by family.    Susann Givens, RN

## 2014-10-02 DEATH — deceased

## 2016-06-25 IMAGING — NM NM HEPATOBILIARY IMAGE, INC GB
2 series · 12 of 12 positions shown · non-contrast
Comparison: Right upper quadrant ultrasound on 04/03/2014

CLINICAL DATA: Nausea, vomiting and thickened gallbladder wall by
ultrasound.

EXAM:
NUCLEAR MEDICINE HEPATOBILIARY IMAGING
TECHNIQUE: Sequential images of the abdomen were obtained [DATE] minutes
following intravenous administration of radiopharmaceutical.
RADIOPHARMACEUTICALS:  5.0 [employer] Choletec

[he hepatobiliary · 3.43mm/px · 6 of 13 frames shown (1 of 2)]
[frame 2/13]
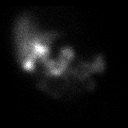
[frame 4/13]
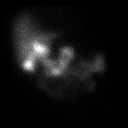
[frame 6/13]
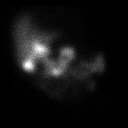
[frame 8/13]
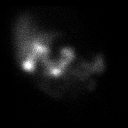
[frame 10/13]
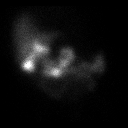
[frame 12/13]
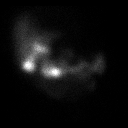

[he hepatobiliary · 3.43mm/px · 6 of 60 frames shown (2 of 2)]
[frame 6/60]
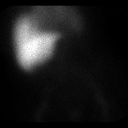
[frame 16/60]
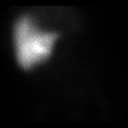
[frame 26/60]
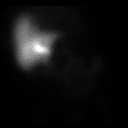
[frame 36/60]
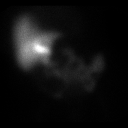
[frame 46/60]
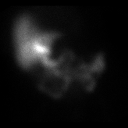
[frame 56/60]
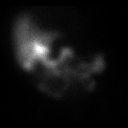

[12 of 12 positions shown; findings below may reference images not displayed]

FINDINGS: There is normal hepatic uptake of radiopharmaceutical. Normal
visualization of the gallbladder at 15 min. There is no evidence of
biliary obstruction with activity noted to transit throughout the
small bowel.
IMPRESSION: Normal hepatobiliary nuclear medicine scan demonstrating no evidence
of cholecystitis or biliary obstruction. Ultrasound findings may
have related to relative contraction of the gallbladder at the time
of imaging.
